# Patient Record
Sex: Female | Born: 1940
Health system: Southern US, Community
[De-identification: ages and names within clinical notes are randomized; demographics above are authoritative.]

## PROBLEM LIST (undated history)

## (undated) DIAGNOSIS — K219 Gastro-esophageal reflux disease without esophagitis: Secondary | ICD-10-CM

## (undated) DIAGNOSIS — E559 Vitamin D deficiency, unspecified: Secondary | ICD-10-CM

## (undated) DIAGNOSIS — I10 Essential (primary) hypertension: Secondary | ICD-10-CM

## (undated) DIAGNOSIS — G4700 Insomnia, unspecified: Secondary | ICD-10-CM

## (undated) DIAGNOSIS — G459 Transient cerebral ischemic attack, unspecified: Secondary | ICD-10-CM

## (undated) DIAGNOSIS — E785 Hyperlipidemia, unspecified: Secondary | ICD-10-CM

## (undated) DIAGNOSIS — G3183 Dementia with Lewy bodies: Secondary | ICD-10-CM

## (undated) DIAGNOSIS — E876 Hypokalemia: Secondary | ICD-10-CM

## (undated) DIAGNOSIS — F419 Anxiety disorder, unspecified: Secondary | ICD-10-CM

## (undated) DIAGNOSIS — K5900 Constipation, unspecified: Secondary | ICD-10-CM

## (undated) DIAGNOSIS — F32A Depression, unspecified: Secondary | ICD-10-CM

## (undated) DIAGNOSIS — C4490 Unspecified malignant neoplasm of skin, unspecified: Secondary | ICD-10-CM

## (undated) DIAGNOSIS — M25569 Pain in unspecified knee: Secondary | ICD-10-CM

## (undated) DIAGNOSIS — G9341 Metabolic encephalopathy: Secondary | ICD-10-CM

## (undated) DIAGNOSIS — I639 Cerebral infarction, unspecified: Secondary | ICD-10-CM

## (undated) DIAGNOSIS — I69354 Hemiplegia and hemiparesis following cerebral infarction affecting left non-dominant side: Secondary | ICD-10-CM

## (undated) DIAGNOSIS — Z9289 Personal history of other medical treatment: Secondary | ICD-10-CM

## (undated) DIAGNOSIS — G3184 Mild cognitive impairment, so stated: Secondary | ICD-10-CM

## (undated) DIAGNOSIS — R2681 Unsteadiness on feet: Secondary | ICD-10-CM

## (undated) DIAGNOSIS — Z9981 Dependence on supplemental oxygen: Secondary | ICD-10-CM

## (undated) DIAGNOSIS — F329 Major depressive disorder, single episode, unspecified: Secondary | ICD-10-CM

## (undated) DIAGNOSIS — M1990 Unspecified osteoarthritis, unspecified site: Secondary | ICD-10-CM

## (undated) DIAGNOSIS — J302 Other seasonal allergic rhinitis: Secondary | ICD-10-CM

## (undated) DIAGNOSIS — R600 Localized edema: Secondary | ICD-10-CM

## (undated) DIAGNOSIS — F0150 Vascular dementia without behavioral disturbance: Secondary | ICD-10-CM

## (undated) HISTORY — PX: EYE SURGERY: SHX253

## (undated) HISTORY — DX: Hyperlipidemia, unspecified: E78.5

## (undated) HISTORY — PX: ARTHROSCOPIC REPAIR ACL: SUR80

## (undated) HISTORY — PX: COLONOSCOPY: SHX174

## (undated) HISTORY — PX: SKIN CANCER EXCISION: SHX779

## (undated) HISTORY — PX: ABDOMINAL HYSTERECTOMY: SHX81

---

## 2000-07-09 ENCOUNTER — Encounter: Admission: RE | Admit: 2000-07-09 | Discharge: 2000-07-09 | Payer: Self-pay | Admitting: Gynecology

## 2000-07-09 ENCOUNTER — Other Ambulatory Visit: Admission: RE | Admit: 2000-07-09 | Discharge: 2000-07-09 | Payer: Self-pay | Admitting: Gynecology

## 2000-07-09 ENCOUNTER — Encounter: Payer: Self-pay | Admitting: Gynecology

## 2001-11-04 ENCOUNTER — Encounter: Payer: Self-pay | Admitting: Gynecology

## 2001-11-04 ENCOUNTER — Encounter: Admission: RE | Admit: 2001-11-04 | Discharge: 2001-11-04 | Payer: Self-pay | Admitting: Gynecology

## 2002-12-28 ENCOUNTER — Other Ambulatory Visit: Admission: RE | Admit: 2002-12-28 | Discharge: 2002-12-28 | Payer: Self-pay | Admitting: Obstetrics and Gynecology

## 2003-02-08 ENCOUNTER — Encounter: Payer: Self-pay | Admitting: Gynecology

## 2003-02-08 ENCOUNTER — Encounter: Admission: RE | Admit: 2003-02-08 | Discharge: 2003-02-08 | Payer: Self-pay | Admitting: Gynecology

## 2004-05-25 ENCOUNTER — Encounter: Admission: RE | Admit: 2004-05-25 | Discharge: 2004-05-25 | Payer: Self-pay | Admitting: Gynecology

## 2004-09-15 ENCOUNTER — Ambulatory Visit: Payer: Self-pay | Admitting: Internal Medicine

## 2004-09-15 ENCOUNTER — Ambulatory Visit (HOSPITAL_COMMUNITY): Admission: RE | Admit: 2004-09-15 | Discharge: 2004-09-15 | Payer: Self-pay | Admitting: Internal Medicine

## 2005-04-19 ENCOUNTER — Emergency Department (HOSPITAL_COMMUNITY): Admission: EM | Admit: 2005-04-19 | Discharge: 2005-04-19 | Payer: Self-pay | Admitting: Emergency Medicine

## 2005-05-28 ENCOUNTER — Encounter: Admission: RE | Admit: 2005-05-28 | Discharge: 2005-05-28 | Payer: Self-pay | Admitting: Gynecology

## 2006-07-08 ENCOUNTER — Encounter: Admission: RE | Admit: 2006-07-08 | Discharge: 2006-07-08 | Payer: Self-pay | Admitting: Gynecology

## 2006-07-09 ENCOUNTER — Ambulatory Visit (HOSPITAL_COMMUNITY): Admission: RE | Admit: 2006-07-09 | Discharge: 2006-07-09 | Payer: Self-pay | Admitting: Family Medicine

## 2006-09-30 ENCOUNTER — Ambulatory Visit (HOSPITAL_COMMUNITY): Admission: RE | Admit: 2006-09-30 | Discharge: 2006-09-30 | Payer: Self-pay | Admitting: Family Medicine

## 2006-11-11 ENCOUNTER — Ambulatory Visit (HOSPITAL_COMMUNITY): Admission: RE | Admit: 2006-11-11 | Discharge: 2006-11-11 | Payer: Self-pay | Admitting: Family Medicine

## 2007-08-14 ENCOUNTER — Encounter: Admission: RE | Admit: 2007-08-14 | Discharge: 2007-08-14 | Payer: Self-pay | Admitting: Gynecology

## 2007-10-05 ENCOUNTER — Encounter: Admission: RE | Admit: 2007-10-05 | Discharge: 2007-10-05 | Payer: Self-pay | Admitting: Orthopedic Surgery

## 2008-03-01 ENCOUNTER — Ambulatory Visit (HOSPITAL_COMMUNITY): Admission: RE | Admit: 2008-03-01 | Discharge: 2008-03-01 | Payer: Self-pay | Admitting: Family Medicine

## 2008-03-16 ENCOUNTER — Ambulatory Visit (HOSPITAL_COMMUNITY): Admission: RE | Admit: 2008-03-16 | Discharge: 2008-03-16 | Payer: Self-pay | Admitting: Family Medicine

## 2008-04-02 ENCOUNTER — Ambulatory Visit (HOSPITAL_COMMUNITY): Admission: RE | Admit: 2008-04-02 | Discharge: 2008-04-02 | Payer: Self-pay | Admitting: Family Medicine

## 2008-04-06 ENCOUNTER — Ambulatory Visit (HOSPITAL_COMMUNITY): Admission: RE | Admit: 2008-04-06 | Discharge: 2008-04-06 | Payer: Self-pay | Admitting: Family Medicine

## 2008-09-09 ENCOUNTER — Encounter: Admission: RE | Admit: 2008-09-09 | Discharge: 2008-09-09 | Payer: Self-pay | Admitting: Gynecology

## 2008-12-22 ENCOUNTER — Emergency Department (HOSPITAL_COMMUNITY): Admission: EM | Admit: 2008-12-22 | Discharge: 2008-12-22 | Payer: Self-pay | Admitting: Emergency Medicine

## 2009-09-12 ENCOUNTER — Encounter: Admission: RE | Admit: 2009-09-12 | Discharge: 2009-09-12 | Payer: Self-pay | Admitting: Gynecology

## 2010-02-13 ENCOUNTER — Ambulatory Visit (HOSPITAL_COMMUNITY): Admission: RE | Admit: 2010-02-13 | Discharge: 2010-02-13 | Payer: Self-pay | Admitting: Family Medicine

## 2010-10-28 ENCOUNTER — Encounter: Payer: Self-pay | Admitting: Family Medicine

## 2010-10-30 ENCOUNTER — Encounter: Payer: Self-pay | Admitting: Family Medicine

## 2011-01-18 LAB — BASIC METABOLIC PANEL
BUN: 4 mg/dL — ABNORMAL LOW (ref 6–23)
Calcium: 9.2 mg/dL (ref 8.4–10.5)
Glucose, Bld: 97 mg/dL (ref 70–99)
Sodium: 131 mEq/L — ABNORMAL LOW (ref 135–145)

## 2011-01-18 LAB — DIFFERENTIAL
Eosinophils Relative: 6 % — ABNORMAL HIGH (ref 0–5)
Lymphocytes Relative: 21 % (ref 12–46)
Monocytes Absolute: 0.6 10*3/uL (ref 0.1–1.0)
Monocytes Relative: 8 % (ref 3–12)
Neutro Abs: 5.2 10*3/uL (ref 1.7–7.7)
Neutrophils Relative %: 65 % (ref 43–77)

## 2011-01-18 LAB — CBC
HCT: 36.1 % (ref 36.0–46.0)
Platelets: 220 10*3/uL (ref 150–400)

## 2011-01-30 ENCOUNTER — Other Ambulatory Visit: Payer: Self-pay | Admitting: Gynecology

## 2011-01-30 DIAGNOSIS — Z1231 Encounter for screening mammogram for malignant neoplasm of breast: Secondary | ICD-10-CM

## 2011-02-07 ENCOUNTER — Ambulatory Visit
Admission: RE | Admit: 2011-02-07 | Discharge: 2011-02-07 | Disposition: A | Discharge status: Home / Self Care | Payer: Medicare Other | Source: Ambulatory Visit | Attending: Gynecology | Admitting: Gynecology

## 2011-02-07 DIAGNOSIS — Z1231 Encounter for screening mammogram for malignant neoplasm of breast: Secondary | ICD-10-CM

## 2011-02-08 ENCOUNTER — Other Ambulatory Visit: Payer: Self-pay | Admitting: Gynecology

## 2011-02-08 DIAGNOSIS — R928 Other abnormal and inconclusive findings on diagnostic imaging of breast: Secondary | ICD-10-CM

## 2011-02-13 ENCOUNTER — Other Ambulatory Visit: Payer: Medicare Other

## 2011-02-14 ENCOUNTER — Ambulatory Visit
Admission: RE | Admit: 2011-02-14 | Discharge: 2011-02-14 | Disposition: A | Discharge status: Home / Self Care | Payer: Medicare Other | Source: Ambulatory Visit | Attending: Gynecology | Admitting: Gynecology

## 2011-02-14 DIAGNOSIS — R928 Other abnormal and inconclusive findings on diagnostic imaging of breast: Secondary | ICD-10-CM

## 2011-02-23 NOTE — Op Note (Signed)
Claire West, Claire West              ACCOUNT NO.:  [id number]   MEDICAL RECORD NO.:  [id number]          PATIENT TYPE:  AMB   LOCATION:  DAY                           FACILITY:  [facility]   PHYSICIAN:  Lionel December, M.D.    DATE OF BIRTH:  06/07/1941   DATE OF PROCEDURE:  09/15/2004  DATE OF DISCHARGE:                                 OPERATIVE REPORT   PROCEDURE:  Total colonoscopy.   INDICATIONS:  Claire West is a 70 year old Caucasian female who is here for  screening colonoscopy. Family history is negative for colorectal carcinoma.  Procedure and risks were reviewed with the patient and informed consent was  obtained.   PREOPERATIVE MEDICATIONS:  Demerol 25 mg IV, Versed 6 mg IV in divided  doses.   FINDINGS:  Procedure performed in endoscopy suite. The patient's vital signs  and O2 saturations were monitored during procedure and remained stable. The  patient was placed in the left lateral position and rectal examination  performed. No abnormality noted on external or digital exam. Olympus video  scope was placed in the rectum and advanced into sigmoid colon which was  somewhat noncompliant and tortuous. Once the scope was passed through the  sigmoid colon further, intubation of the cecum was rather easy. Cecum was  identified by appendiceal orifice and ileocecal valve. Picture taken for the  record. As the scope was withdrawn, colonic mucosa was carefully examined  and was normal throughout. The rectal mucosa similarly was normal. Scope was  retroflexed to examine anorectal junction, and mucosa at anorectal junction  was thickened and prominent. No hemorrhoids were noted. The scope was  straightened and withdrawn. The patient tolerated the procedure well.   FINAL DIAGNOSIS:  Normal colonoscopy.   RECOMMENDATIONS:  Yearly hemoccults. She may consider screening in 10 years  from now.     Claire West   NR/MEDQ  D:  09/15/2004  T:  09/15/2004  Job:  [id number]   cc:   Patrica Duel, M.D.  [address]  [place]  [place] [address]  Fax: [phone]

## 2011-05-15 ENCOUNTER — Ambulatory Visit (INDEPENDENT_AMBULATORY_CARE_PROVIDER_SITE_OTHER): Payer: Medicare Other | Admitting: Urology

## 2011-05-15 DIAGNOSIS — R350 Frequency of micturition: Secondary | ICD-10-CM

## 2011-05-15 DIAGNOSIS — N3946 Mixed incontinence: Secondary | ICD-10-CM

## 2011-05-15 DIAGNOSIS — R3915 Urgency of urination: Secondary | ICD-10-CM

## 2011-05-15 DIAGNOSIS — N952 Postmenopausal atrophic vaginitis: Secondary | ICD-10-CM

## 2011-07-10 ENCOUNTER — Ambulatory Visit (INDEPENDENT_AMBULATORY_CARE_PROVIDER_SITE_OTHER): Payer: Medicare Other | Admitting: Urology

## 2011-07-10 DIAGNOSIS — R350 Frequency of micturition: Secondary | ICD-10-CM

## 2011-07-10 DIAGNOSIS — N952 Postmenopausal atrophic vaginitis: Secondary | ICD-10-CM

## 2011-07-10 DIAGNOSIS — R3915 Urgency of urination: Secondary | ICD-10-CM

## 2011-07-10 DIAGNOSIS — N3946 Mixed incontinence: Secondary | ICD-10-CM

## 2011-10-10 DIAGNOSIS — R269 Unspecified abnormalities of gait and mobility: Secondary | ICD-10-CM | POA: Diagnosis not present

## 2011-10-12 DIAGNOSIS — R269 Unspecified abnormalities of gait and mobility: Secondary | ICD-10-CM | POA: Diagnosis not present

## 2011-10-15 DIAGNOSIS — R269 Unspecified abnormalities of gait and mobility: Secondary | ICD-10-CM | POA: Diagnosis not present

## 2011-10-17 DIAGNOSIS — R269 Unspecified abnormalities of gait and mobility: Secondary | ICD-10-CM | POA: Diagnosis not present

## 2011-10-19 DIAGNOSIS — R269 Unspecified abnormalities of gait and mobility: Secondary | ICD-10-CM | POA: Diagnosis not present

## 2011-10-22 DIAGNOSIS — R269 Unspecified abnormalities of gait and mobility: Secondary | ICD-10-CM | POA: Diagnosis not present

## 2011-10-24 DIAGNOSIS — R269 Unspecified abnormalities of gait and mobility: Secondary | ICD-10-CM | POA: Diagnosis not present

## 2011-10-26 DIAGNOSIS — R269 Unspecified abnormalities of gait and mobility: Secondary | ICD-10-CM | POA: Diagnosis not present

## 2011-10-29 DIAGNOSIS — R269 Unspecified abnormalities of gait and mobility: Secondary | ICD-10-CM | POA: Diagnosis not present

## 2011-10-31 DIAGNOSIS — R269 Unspecified abnormalities of gait and mobility: Secondary | ICD-10-CM | POA: Diagnosis not present

## 2011-11-02 DIAGNOSIS — R269 Unspecified abnormalities of gait and mobility: Secondary | ICD-10-CM | POA: Diagnosis not present

## 2011-11-05 DIAGNOSIS — R269 Unspecified abnormalities of gait and mobility: Secondary | ICD-10-CM | POA: Diagnosis not present

## 2011-11-07 DIAGNOSIS — R269 Unspecified abnormalities of gait and mobility: Secondary | ICD-10-CM | POA: Diagnosis not present

## 2011-11-09 DIAGNOSIS — R269 Unspecified abnormalities of gait and mobility: Secondary | ICD-10-CM | POA: Diagnosis not present

## 2011-11-12 DIAGNOSIS — R269 Unspecified abnormalities of gait and mobility: Secondary | ICD-10-CM | POA: Diagnosis not present

## 2011-11-14 DIAGNOSIS — R269 Unspecified abnormalities of gait and mobility: Secondary | ICD-10-CM | POA: Diagnosis not present

## 2011-11-16 DIAGNOSIS — IMO0002 Reserved for concepts with insufficient information to code with codable children: Secondary | ICD-10-CM | POA: Diagnosis not present

## 2011-11-16 DIAGNOSIS — N959 Unspecified menopausal and perimenopausal disorder: Secondary | ICD-10-CM | POA: Diagnosis not present

## 2011-11-16 DIAGNOSIS — R269 Unspecified abnormalities of gait and mobility: Secondary | ICD-10-CM | POA: Diagnosis not present

## 2011-11-16 DIAGNOSIS — R05 Cough: Secondary | ICD-10-CM | POA: Diagnosis not present

## 2011-11-16 DIAGNOSIS — J069 Acute upper respiratory infection, unspecified: Secondary | ICD-10-CM | POA: Diagnosis not present

## 2011-11-19 DIAGNOSIS — R269 Unspecified abnormalities of gait and mobility: Secondary | ICD-10-CM | POA: Diagnosis not present

## 2011-11-20 DIAGNOSIS — Z4789 Encounter for other orthopedic aftercare: Secondary | ICD-10-CM | POA: Diagnosis not present

## 2011-11-20 DIAGNOSIS — M25569 Pain in unspecified knee: Secondary | ICD-10-CM | POA: Diagnosis not present

## 2011-11-23 DIAGNOSIS — R269 Unspecified abnormalities of gait and mobility: Secondary | ICD-10-CM | POA: Diagnosis not present

## 2011-11-26 DIAGNOSIS — R269 Unspecified abnormalities of gait and mobility: Secondary | ICD-10-CM | POA: Diagnosis not present

## 2011-11-28 DIAGNOSIS — R269 Unspecified abnormalities of gait and mobility: Secondary | ICD-10-CM | POA: Diagnosis not present

## 2011-11-30 DIAGNOSIS — R269 Unspecified abnormalities of gait and mobility: Secondary | ICD-10-CM | POA: Diagnosis not present

## 2012-01-01 DIAGNOSIS — IMO0002 Reserved for concepts with insufficient information to code with codable children: Secondary | ICD-10-CM | POA: Diagnosis not present

## 2012-01-01 DIAGNOSIS — S239XXA Sprain of unspecified parts of thorax, initial encounter: Secondary | ICD-10-CM | POA: Diagnosis not present

## 2012-01-02 DIAGNOSIS — E782 Mixed hyperlipidemia: Secondary | ICD-10-CM | POA: Diagnosis not present

## 2012-01-02 DIAGNOSIS — S239XXA Sprain of unspecified parts of thorax, initial encounter: Secondary | ICD-10-CM | POA: Diagnosis not present

## 2012-01-02 DIAGNOSIS — I10 Essential (primary) hypertension: Secondary | ICD-10-CM | POA: Diagnosis not present

## 2012-02-13 DIAGNOSIS — N390 Urinary tract infection, site not specified: Secondary | ICD-10-CM | POA: Diagnosis not present

## 2012-02-13 DIAGNOSIS — IMO0002 Reserved for concepts with insufficient information to code with codable children: Secondary | ICD-10-CM | POA: Diagnosis not present

## 2012-02-13 DIAGNOSIS — I10 Essential (primary) hypertension: Secondary | ICD-10-CM | POA: Diagnosis not present

## 2012-02-22 DIAGNOSIS — N390 Urinary tract infection, site not specified: Secondary | ICD-10-CM | POA: Diagnosis not present

## 2012-02-22 DIAGNOSIS — IMO0002 Reserved for concepts with insufficient information to code with codable children: Secondary | ICD-10-CM | POA: Diagnosis not present

## 2012-02-22 DIAGNOSIS — I10 Essential (primary) hypertension: Secondary | ICD-10-CM | POA: Diagnosis not present

## 2012-02-22 DIAGNOSIS — R609 Edema, unspecified: Secondary | ICD-10-CM | POA: Diagnosis not present

## 2012-02-27 DIAGNOSIS — L259 Unspecified contact dermatitis, unspecified cause: Secondary | ICD-10-CM | POA: Diagnosis not present

## 2012-02-27 DIAGNOSIS — IMO0002 Reserved for concepts with insufficient information to code with codable children: Secondary | ICD-10-CM | POA: Diagnosis not present

## 2012-02-28 DIAGNOSIS — H2510 Age-related nuclear cataract, unspecified eye: Secondary | ICD-10-CM | POA: Diagnosis not present

## 2012-05-10 DIAGNOSIS — IMO0002 Reserved for concepts with insufficient information to code with codable children: Secondary | ICD-10-CM | POA: Diagnosis not present

## 2012-05-10 DIAGNOSIS — N390 Urinary tract infection, site not specified: Secondary | ICD-10-CM | POA: Diagnosis not present

## 2012-05-10 DIAGNOSIS — R300 Dysuria: Secondary | ICD-10-CM | POA: Diagnosis not present

## 2012-07-03 ENCOUNTER — Other Ambulatory Visit (HOSPITAL_COMMUNITY): Payer: Self-pay | Admitting: Internal Medicine

## 2012-07-03 ENCOUNTER — Ambulatory Visit (HOSPITAL_COMMUNITY)
Admission: RE | Admit: 2012-07-03 | Discharge: 2012-07-03 | Disposition: A | Discharge status: Home / Self Care | Payer: Medicare Other | Source: Ambulatory Visit | Attending: Internal Medicine | Admitting: Internal Medicine

## 2012-07-03 DIAGNOSIS — K7689 Other specified diseases of liver: Secondary | ICD-10-CM | POA: Diagnosis not present

## 2012-07-03 DIAGNOSIS — Z139 Encounter for screening, unspecified: Secondary | ICD-10-CM

## 2012-07-03 DIAGNOSIS — R1011 Right upper quadrant pain: Secondary | ICD-10-CM | POA: Diagnosis not present

## 2012-07-03 DIAGNOSIS — I10 Essential (primary) hypertension: Secondary | ICD-10-CM | POA: Diagnosis not present

## 2012-07-03 DIAGNOSIS — R109 Unspecified abdominal pain: Secondary | ICD-10-CM

## 2012-07-03 DIAGNOSIS — IMO0002 Reserved for concepts with insufficient information to code with codable children: Secondary | ICD-10-CM | POA: Diagnosis not present

## 2012-07-03 DIAGNOSIS — E785 Hyperlipidemia, unspecified: Secondary | ICD-10-CM | POA: Diagnosis not present

## 2012-07-03 DIAGNOSIS — Z79899 Other long term (current) drug therapy: Secondary | ICD-10-CM | POA: Diagnosis not present

## 2012-07-03 DIAGNOSIS — Z23 Encounter for immunization: Secondary | ICD-10-CM | POA: Diagnosis not present

## 2012-07-04 ENCOUNTER — Other Ambulatory Visit (HOSPITAL_COMMUNITY): Payer: Self-pay | Admitting: Internal Medicine

## 2012-07-04 DIAGNOSIS — R109 Unspecified abdominal pain: Secondary | ICD-10-CM

## 2012-07-07 ENCOUNTER — Ambulatory Visit (HOSPITAL_COMMUNITY): Payer: Medicare Other

## 2012-07-08 ENCOUNTER — Encounter (HOSPITAL_COMMUNITY)
Admission: RE | Admit: 2012-07-08 | Discharge: 2012-07-08 | Disposition: A | Discharge status: Home / Self Care | Payer: Medicare Other | Source: Ambulatory Visit | Attending: Internal Medicine | Admitting: Internal Medicine

## 2012-07-08 ENCOUNTER — Encounter (HOSPITAL_COMMUNITY): Payer: Self-pay

## 2012-07-08 DIAGNOSIS — R109 Unspecified abdominal pain: Secondary | ICD-10-CM | POA: Insufficient documentation

## 2012-07-08 HISTORY — DX: Essential (primary) hypertension: I10

## 2012-07-08 MED ORDER — TECHNETIUM TC 99M MEBROFENIN IV KIT
5.0000 | PACK | Freq: Once | INTRAVENOUS | Status: AC | PRN
  Administered 2012-07-08: 5 via INTRAVENOUS

## 2012-07-09 ENCOUNTER — Ambulatory Visit (HOSPITAL_COMMUNITY)
Admission: RE | Admit: 2012-07-09 | Discharge: 2012-07-09 | Disposition: A | Discharge status: Home / Self Care | Payer: Medicare Other | Source: Ambulatory Visit | Attending: Internal Medicine | Admitting: Internal Medicine

## 2012-07-09 DIAGNOSIS — R109 Unspecified abdominal pain: Secondary | ICD-10-CM | POA: Diagnosis not present

## 2012-07-09 MED ORDER — IOHEXOL 350 MG/ML SOLN
100.0000 mL | Freq: Once | INTRAVENOUS | Status: AC | PRN
  Administered 2012-07-09: 100 mL via INTRAVENOUS

## 2012-07-22 DIAGNOSIS — J029 Acute pharyngitis, unspecified: Secondary | ICD-10-CM | POA: Diagnosis not present

## 2012-07-22 DIAGNOSIS — J069 Acute upper respiratory infection, unspecified: Secondary | ICD-10-CM | POA: Diagnosis not present

## 2012-07-22 DIAGNOSIS — IMO0002 Reserved for concepts with insufficient information to code with codable children: Secondary | ICD-10-CM | POA: Diagnosis not present

## 2012-10-09 DIAGNOSIS — IMO0002 Reserved for concepts with insufficient information to code with codable children: Secondary | ICD-10-CM | POA: Diagnosis not present

## 2012-10-09 DIAGNOSIS — J019 Acute sinusitis, unspecified: Secondary | ICD-10-CM | POA: Diagnosis not present

## 2012-10-09 DIAGNOSIS — L259 Unspecified contact dermatitis, unspecified cause: Secondary | ICD-10-CM | POA: Diagnosis not present

## 2012-10-09 DIAGNOSIS — I10 Essential (primary) hypertension: Secondary | ICD-10-CM | POA: Diagnosis not present

## 2012-11-11 DIAGNOSIS — Z85828 Personal history of other malignant neoplasm of skin: Secondary | ICD-10-CM | POA: Diagnosis not present

## 2012-11-11 DIAGNOSIS — Z1283 Encounter for screening for malignant neoplasm of skin: Secondary | ICD-10-CM | POA: Diagnosis not present

## 2012-12-24 ENCOUNTER — Other Ambulatory Visit (HOSPITAL_COMMUNITY): Payer: Self-pay | Admitting: Family Medicine

## 2012-12-24 DIAGNOSIS — I10 Essential (primary) hypertension: Secondary | ICD-10-CM | POA: Diagnosis not present

## 2012-12-24 DIAGNOSIS — J309 Allergic rhinitis, unspecified: Secondary | ICD-10-CM | POA: Diagnosis not present

## 2012-12-24 DIAGNOSIS — Z139 Encounter for screening, unspecified: Secondary | ICD-10-CM

## 2012-12-24 DIAGNOSIS — M159 Polyosteoarthritis, unspecified: Secondary | ICD-10-CM | POA: Diagnosis not present

## 2012-12-24 DIAGNOSIS — IMO0002 Reserved for concepts with insufficient information to code with codable children: Secondary | ICD-10-CM | POA: Diagnosis not present

## 2012-12-24 DIAGNOSIS — N958 Other specified menopausal and perimenopausal disorders: Secondary | ICD-10-CM | POA: Diagnosis not present

## 2012-12-29 ENCOUNTER — Other Ambulatory Visit (HOSPITAL_COMMUNITY): Payer: Medicare Other

## 2013-02-18 DIAGNOSIS — J310 Chronic rhinitis: Secondary | ICD-10-CM | POA: Diagnosis not present

## 2013-02-18 DIAGNOSIS — J019 Acute sinusitis, unspecified: Secondary | ICD-10-CM | POA: Diagnosis not present

## 2013-02-18 DIAGNOSIS — IMO0002 Reserved for concepts with insufficient information to code with codable children: Secondary | ICD-10-CM | POA: Diagnosis not present

## 2013-05-12 DIAGNOSIS — IMO0002 Reserved for concepts with insufficient information to code with codable children: Secondary | ICD-10-CM | POA: Diagnosis not present

## 2013-05-12 DIAGNOSIS — F411 Generalized anxiety disorder: Secondary | ICD-10-CM | POA: Diagnosis not present

## 2013-05-12 DIAGNOSIS — F4320 Adjustment disorder, unspecified: Secondary | ICD-10-CM | POA: Diagnosis not present

## 2013-06-09 DIAGNOSIS — N390 Urinary tract infection, site not specified: Secondary | ICD-10-CM | POA: Diagnosis not present

## 2013-06-09 DIAGNOSIS — IMO0002 Reserved for concepts with insufficient information to code with codable children: Secondary | ICD-10-CM | POA: Diagnosis not present

## 2013-06-19 DIAGNOSIS — H2510 Age-related nuclear cataract, unspecified eye: Secondary | ICD-10-CM | POA: Diagnosis not present

## 2013-07-15 DIAGNOSIS — Z23 Encounter for immunization: Secondary | ICD-10-CM | POA: Diagnosis not present

## 2013-08-17 DIAGNOSIS — J069 Acute upper respiratory infection, unspecified: Secondary | ICD-10-CM | POA: Diagnosis not present

## 2013-08-17 DIAGNOSIS — J029 Acute pharyngitis, unspecified: Secondary | ICD-10-CM | POA: Diagnosis not present

## 2013-08-17 DIAGNOSIS — IMO0002 Reserved for concepts with insufficient information to code with codable children: Secondary | ICD-10-CM | POA: Diagnosis not present

## 2014-01-18 DIAGNOSIS — I10 Essential (primary) hypertension: Secondary | ICD-10-CM | POA: Diagnosis not present

## 2014-01-18 DIAGNOSIS — IMO0002 Reserved for concepts with insufficient information to code with codable children: Secondary | ICD-10-CM | POA: Diagnosis not present

## 2014-01-18 DIAGNOSIS — N390 Urinary tract infection, site not specified: Secondary | ICD-10-CM | POA: Diagnosis not present

## 2014-02-09 DIAGNOSIS — IMO0002 Reserved for concepts with insufficient information to code with codable children: Secondary | ICD-10-CM | POA: Diagnosis not present

## 2014-02-09 DIAGNOSIS — I10 Essential (primary) hypertension: Secondary | ICD-10-CM | POA: Diagnosis not present

## 2014-02-09 DIAGNOSIS — J019 Acute sinusitis, unspecified: Secondary | ICD-10-CM | POA: Diagnosis not present

## 2014-03-09 DIAGNOSIS — K219 Gastro-esophageal reflux disease without esophagitis: Secondary | ICD-10-CM | POA: Diagnosis not present

## 2014-03-09 DIAGNOSIS — I10 Essential (primary) hypertension: Secondary | ICD-10-CM | POA: Diagnosis not present

## 2014-03-09 DIAGNOSIS — IMO0002 Reserved for concepts with insufficient information to code with codable children: Secondary | ICD-10-CM | POA: Diagnosis not present

## 2014-03-18 DIAGNOSIS — G5600 Carpal tunnel syndrome, unspecified upper limb: Secondary | ICD-10-CM | POA: Diagnosis not present

## 2014-05-04 DIAGNOSIS — M25569 Pain in unspecified knee: Secondary | ICD-10-CM | POA: Diagnosis not present

## 2014-05-12 DIAGNOSIS — N390 Urinary tract infection, site not specified: Secondary | ICD-10-CM | POA: Diagnosis not present

## 2014-05-12 DIAGNOSIS — IMO0002 Reserved for concepts with insufficient information to code with codable children: Secondary | ICD-10-CM | POA: Diagnosis not present

## 2014-06-15 DIAGNOSIS — M25569 Pain in unspecified knee: Secondary | ICD-10-CM | POA: Diagnosis not present

## 2014-06-21 DIAGNOSIS — H2510 Age-related nuclear cataract, unspecified eye: Secondary | ICD-10-CM | POA: Diagnosis not present

## 2014-07-01 DIAGNOSIS — Z23 Encounter for immunization: Secondary | ICD-10-CM | POA: Diagnosis not present

## 2014-07-06 DIAGNOSIS — H5340 Unspecified visual field defects: Secondary | ICD-10-CM | POA: Diagnosis not present

## 2014-11-08 DIAGNOSIS — L309 Dermatitis, unspecified: Secondary | ICD-10-CM | POA: Diagnosis not present

## 2014-11-08 DIAGNOSIS — Z6821 Body mass index (BMI) 21.0-21.9, adult: Secondary | ICD-10-CM | POA: Diagnosis not present

## 2014-11-08 DIAGNOSIS — F419 Anxiety disorder, unspecified: Secondary | ICD-10-CM | POA: Diagnosis not present

## 2014-11-18 DIAGNOSIS — J040 Acute laryngitis: Secondary | ICD-10-CM | POA: Diagnosis not present

## 2014-11-18 DIAGNOSIS — Z6821 Body mass index (BMI) 21.0-21.9, adult: Secondary | ICD-10-CM | POA: Diagnosis not present

## 2014-11-18 DIAGNOSIS — F329 Major depressive disorder, single episode, unspecified: Secondary | ICD-10-CM | POA: Diagnosis not present

## 2015-01-21 DIAGNOSIS — K529 Noninfective gastroenteritis and colitis, unspecified: Secondary | ICD-10-CM | POA: Diagnosis not present

## 2015-01-21 DIAGNOSIS — F4320 Adjustment disorder, unspecified: Secondary | ICD-10-CM | POA: Diagnosis not present

## 2015-01-21 DIAGNOSIS — Z6820 Body mass index (BMI) 20.0-20.9, adult: Secondary | ICD-10-CM | POA: Diagnosis not present

## 2015-03-14 DIAGNOSIS — R350 Frequency of micturition: Secondary | ICD-10-CM | POA: Diagnosis not present

## 2015-03-14 DIAGNOSIS — Z6821 Body mass index (BMI) 21.0-21.9, adult: Secondary | ICD-10-CM | POA: Diagnosis not present

## 2015-05-13 ENCOUNTER — Emergency Department (HOSPITAL_COMMUNITY): Payer: Medicare Other

## 2015-05-13 ENCOUNTER — Encounter (HOSPITAL_COMMUNITY): Payer: Self-pay | Admitting: Emergency Medicine

## 2015-05-13 ENCOUNTER — Emergency Department (HOSPITAL_COMMUNITY)
Admission: EM | Admit: 2015-05-13 | Discharge: 2015-05-13 | Disposition: A | Discharge status: Home / Self Care | Payer: Medicare Other | Attending: Emergency Medicine | Admitting: Emergency Medicine

## 2015-05-13 DIAGNOSIS — Z880 Allergy status to penicillin: Secondary | ICD-10-CM | POA: Diagnosis not present

## 2015-05-13 DIAGNOSIS — I10 Essential (primary) hypertension: Secondary | ICD-10-CM | POA: Diagnosis not present

## 2015-05-13 DIAGNOSIS — Y998 Other external cause status: Secondary | ICD-10-CM | POA: Diagnosis not present

## 2015-05-13 DIAGNOSIS — Z859 Personal history of malignant neoplasm, unspecified: Secondary | ICD-10-CM | POA: Diagnosis not present

## 2015-05-13 DIAGNOSIS — Z7982 Long term (current) use of aspirin: Secondary | ICD-10-CM | POA: Insufficient documentation

## 2015-05-13 DIAGNOSIS — Y92009 Unspecified place in unspecified non-institutional (private) residence as the place of occurrence of the external cause: Secondary | ICD-10-CM | POA: Diagnosis not present

## 2015-05-13 DIAGNOSIS — Y9389 Activity, other specified: Secondary | ICD-10-CM | POA: Insufficient documentation

## 2015-05-13 DIAGNOSIS — W108XXA Fall (on) (from) other stairs and steps, initial encounter: Secondary | ICD-10-CM | POA: Diagnosis not present

## 2015-05-13 DIAGNOSIS — Z791 Long term (current) use of non-steroidal anti-inflammatories (NSAID): Secondary | ICD-10-CM | POA: Insufficient documentation

## 2015-05-13 DIAGNOSIS — Z79899 Other long term (current) drug therapy: Secondary | ICD-10-CM | POA: Insufficient documentation

## 2015-05-13 DIAGNOSIS — S93402A Sprain of unspecified ligament of left ankle, initial encounter: Secondary | ICD-10-CM | POA: Insufficient documentation

## 2015-05-13 DIAGNOSIS — S93409A Sprain of unspecified ligament of unspecified ankle, initial encounter: Secondary | ICD-10-CM

## 2015-05-13 DIAGNOSIS — S99912A Unspecified injury of left ankle, initial encounter: Secondary | ICD-10-CM | POA: Diagnosis present

## 2015-05-13 NOTE — Discharge Instructions (Signed)
Nonweightbearing on your left ankle for the next 48 hours. Walk with crutches only. Intermittently apply ice to your ankle. As your pain and swelling improved, slowly increase your weightbearing and use of this ankle.  Ankle Sprain An ankle sprain is an injury to the strong, fibrous tissues (ligaments) that hold the bones of your ankle joint together.  CAUSES An ankle sprain is usually caused by a fall or by twisting your ankle. Ankle sprains most commonly occur when you step on the outer edge of your foot, and your ankle turns inward. People who participate in sports are more prone to these types of injuries.  SYMPTOMS   Pain in your ankle. The pain may be present at rest or only when you are trying to stand or walk.  Swelling.  Bruising. Bruising may develop immediately or within 1 to 2 days after your injury.  Difficulty standing or walking, particularly when turning corners or changing directions. DIAGNOSIS  Your caregiver will ask you details about your injury and perform a physical exam of your ankle to determine if you have an ankle sprain. During the physical exam, your caregiver will press on and apply pressure to specific areas of your foot and ankle. Your caregiver will try to move your ankle in certain ways. An X-ray exam may be done to be sure a bone was not broken or a ligament did not separate from one of the bones in your ankle (avulsion fracture).  TREATMENT  Certain types of braces can help stabilize your ankle. Your caregiver can make a recommendation for this. Your caregiver may recommend the use of medicine for pain. If your sprain is severe, your caregiver may refer you to a surgeon who helps to restore function to parts of your skeletal system (orthopedist) or a physical therapist. Parke ice to your injury for 1-2 days or as directed by your caregiver. Applying ice helps to reduce inflammation and pain.  Put ice in a plastic bag.  Place a  towel between your skin and the bag.  Leave the ice on for 15-20 minutes at a time, every 2 hours while you are awake.  Only take over-the-counter or prescription medicines for pain, discomfort, or fever as directed by your caregiver.  Elevate your injured ankle above the level of your heart as much as possible for 2-3 days.  If your caregiver recommends crutches, use them as instructed. Gradually put weight on the affected ankle. Continue to use crutches or a cane until you can walk without feeling pain in your ankle.  If you have a plaster splint, wear the splint as directed by your caregiver. Do not rest it on anything harder than a pillow for the first 24 hours. Do not put weight on it. Do not get it wet. You may take it off to take a shower or bath.  You may have been given an elastic bandage to wear around your ankle to provide support. If the elastic bandage is too tight (you have numbness or tingling in your foot or your foot becomes cold and blue), adjust the bandage to make it comfortable.  If you have an air splint, you may blow more air into it or let air out to make it more comfortable. You may take your splint off at night and before taking a shower or bath. Wiggle your toes in the splint several times per day to decrease swelling. SEEK MEDICAL CARE IF:   You have rapidly increasing  bruising or swelling.  Your toes feel extremely cold or you lose feeling in your foot.  Your pain is not relieved with medicine. SEEK IMMEDIATE MEDICAL CARE IF:  Your toes are numb or blue.  You have severe pain that is increasing. MAKE SURE YOU:   Understand these instructions.  Will watch your condition.  Will get help right away if you are not doing well or get worse. Document Released: 09/24/2005 Document Revised: 06/18/2012 Document Reviewed: 10/06/2011 [facility] Patient Information 2015 [place], [place]. This information is not intended to replace advice given to you by your health  care provider. Make sure you discuss any questions you have with your health care provider.

## 2015-05-13 NOTE — ED Provider Notes (Signed)
CSN: [id number]     Arrival date & time 05/13/15  1939 History   First MD Initiated Contact with Patient 05/13/15 1955     Chief Complaint  Patient presents with  . Ankle Pain     HPI  Patient presents for valuation of lateral left ankle pain after a fall at home. Inverted her ankle. Swollen and black and blue. Walks with a limp. No other areas of pain concern or injury.  Past Medical History  Diagnosis Date  . Cancer   . Hypertension    Past Surgical History  Procedure Laterality Date  . Arthroscopic repair acl    . Abdominal hysterectomy     History reviewed. No pertinent family history. History  Substance Use Topics  . Smoking status: Never Smoker   . Smokeless tobacco: Not on file  . Alcohol Use: No   OB History    No data available     Review of Systems  Constitutional: Negative for fever, chills, diaphoresis, appetite change and fatigue.  HENT: Negative for mouth sores, sore throat and trouble swallowing.   Eyes: Negative for visual disturbance.  Respiratory: Negative for cough, chest tightness, shortness of breath and wheezing.   Cardiovascular: Negative for chest pain.  Gastrointestinal: Negative for nausea, vomiting, abdominal pain, diarrhea and abdominal distention.  Endocrine: Negative for polydipsia, polyphagia and polyuria.  Genitourinary: Negative for dysuria, frequency and hematuria.  Musculoskeletal: Negative for gait problem.       Left ankle pain laterally. No pain at the knee  Skin: Negative for color change, pallor and rash.  Neurological: Negative for dizziness, syncope, light-headedness and headaches.  Hematological: Does not bruise/bleed easily.  Psychiatric/Behavioral: Negative for behavioral problems and confusion.      Allergies  Codeine; Penicillins; and Sulfa antibiotics  Home Medications   Prior to Admission medications   Medication Sig Start Date End Date Taking? Authorizing Provider  aspirin EC 81 MG tablet Take 81 mg by mouth  daily.   Yes Historical Provider, MD  atenolol (TENORMIN) 25 MG tablet Take 25 mg by mouth daily. 02/18/15  Yes Historical Provider, MD  Calcium Carbonate-Vitamin D (CALCIUM-D PO) Take 1 tablet by mouth daily.   Yes Historical Provider, MD  escitalopram (LEXAPRO) 5 MG tablet Take 5 mg by mouth daily. 04/23/15  Yes Historical Provider, MD  fluticasone (FLONASE) 50 MCG/ACT nasal spray Place 2 sprays into both nostrils daily as needed. 04/16/15  Yes Historical Provider, MD  losartan-hydrochlorothiazide (HYZAAR) 100-12.5 MG per tablet Take 1 tablet by mouth daily. 04/16/15  Yes Historical Provider, MD  meloxicam (MOBIC) 15 MG tablet Take 15 mg by mouth daily. 04/16/15  Yes Historical Provider, MD  Multiple Vitamin (MULTIVITAMIN WITH MINERALS) TABS tablet Take 1 tablet by mouth daily.   Yes Historical Provider, MD  omeprazole (PRILOSEC) 20 MG capsule Take 20 mg by mouth daily. 04/18/15  Yes Historical Provider, MD   BP 167/89 mmHg  Pulse 72  Temp(Src) 98.3 F (36.8 C) (Oral)  Resp 18  Ht 5\' 6"  (1.676 m)  Wt 130 lb (58.968 kg)  BMI 20.99 kg/m2  SpO2 98% Physical Exam  Constitutional: She is oriented to person, place, and time. She appears well-developed and well-nourished. No distress.  HENT:  Head: Normocephalic.  Eyes: Conjunctivae are normal. Pupils are equal, round, and reactive to light. No scleral icterus.  Neck: Normal range of motion. Neck supple. No thyromegaly present.  Cardiovascular: Normal rate and regular rhythm.  Exam reveals no gallop and no friction rub.  No murmur heard. Pulmonary/Chest: Effort normal and breath sounds normal. No respiratory distress. She has no wheezes. She has no rales.  Abdominal: Soft. Bowel sounds are normal. She exhibits no distension. There is no tenderness. There is no rebound.  Musculoskeletal: Normal range of motion.       Feet:  Neurological: She is alert and oriented to person, place, and time.  Skin: Skin is warm and dry. No rash noted.    Psychiatric: She has a normal mood and affect. Her behavior is normal.    ED Course  Procedures (including critical care time) Labs Review Labs Reviewed - No data to display  Imaging Review Dg Ankle Complete Left  05/13/2015   CLINICAL DATA:  Patient coming down the stairs and twisted ankle. Initial encounter.  EXAM: LEFT ANKLE COMPLETE - 3+ VIEW  COMPARISON:  None.  FINDINGS: Normal anatomic alignment. No evidence for acute fracture dislocation. Regional soft tissue swelling. Vascular calcifications.  IMPRESSION: No acute osseous abnormality.   Electronically Signed   By: Lovey Newcomer M.D.   On: 05/13/2015 20:43     EKG Interpretation None      MDM   Final diagnoses:  Ankle sprain, unspecified laterality, initial encounter    Lasix splint placed. Instructed the use of crutches. Discharged home. Ice and elevate. Primary care follow-up if not improving. Slowly increase her weightbearing after 48 hours at nonweightbearing.    Tanna Furry, MD 05/13/15 2350

## 2015-05-13 NOTE — ED Notes (Signed)
Patient verbalizes understanding of discharge instructions, home care and follow up care. Patient demonstrated proper use of ankle brace and crutches. Patient out of department at this time with family.

## 2015-05-13 NOTE — ED Notes (Signed)
Patient states she was coming down steps and when she got to the last one her left ankle turned. Complaining of pain to left ankle. Noted swelling to left ankle at triage.

## 2015-05-24 DIAGNOSIS — Z1389 Encounter for screening for other disorder: Secondary | ICD-10-CM | POA: Diagnosis not present

## 2015-05-24 DIAGNOSIS — S93402D Sprain of unspecified ligament of left ankle, subsequent encounter: Secondary | ICD-10-CM | POA: Diagnosis not present

## 2015-05-24 DIAGNOSIS — Z6821 Body mass index (BMI) 21.0-21.9, adult: Secondary | ICD-10-CM | POA: Diagnosis not present

## 2015-06-24 DIAGNOSIS — M25562 Pain in left knee: Secondary | ICD-10-CM | POA: Diagnosis not present

## 2015-06-24 DIAGNOSIS — M170 Bilateral primary osteoarthritis of knee: Secondary | ICD-10-CM | POA: Diagnosis not present

## 2015-06-24 DIAGNOSIS — M25561 Pain in right knee: Secondary | ICD-10-CM | POA: Diagnosis not present

## 2015-07-07 DIAGNOSIS — Z23 Encounter for immunization: Secondary | ICD-10-CM | POA: Diagnosis not present

## 2015-09-05 DIAGNOSIS — Z1389 Encounter for screening for other disorder: Secondary | ICD-10-CM | POA: Diagnosis not present

## 2015-09-05 DIAGNOSIS — J069 Acute upper respiratory infection, unspecified: Secondary | ICD-10-CM | POA: Diagnosis not present

## 2015-09-05 DIAGNOSIS — Z6822 Body mass index (BMI) 22.0-22.9, adult: Secondary | ICD-10-CM | POA: Diagnosis not present

## 2015-09-06 ENCOUNTER — Encounter (INDEPENDENT_AMBULATORY_CARE_PROVIDER_SITE_OTHER): Payer: Self-pay | Admitting: *Deleted

## 2015-09-21 ENCOUNTER — Other Ambulatory Visit (INDEPENDENT_AMBULATORY_CARE_PROVIDER_SITE_OTHER): Payer: Self-pay | Admitting: *Deleted

## 2015-09-21 DIAGNOSIS — Z1211 Encounter for screening for malignant neoplasm of colon: Secondary | ICD-10-CM

## 2015-10-07 DIAGNOSIS — M170 Bilateral primary osteoarthritis of knee: Secondary | ICD-10-CM | POA: Diagnosis not present

## 2015-10-10 DIAGNOSIS — Z6820 Body mass index (BMI) 20.0-20.9, adult: Secondary | ICD-10-CM | POA: Diagnosis not present

## 2015-10-10 DIAGNOSIS — J029 Acute pharyngitis, unspecified: Secondary | ICD-10-CM | POA: Diagnosis not present

## 2015-10-10 DIAGNOSIS — Z1389 Encounter for screening for other disorder: Secondary | ICD-10-CM | POA: Diagnosis not present

## 2015-10-10 DIAGNOSIS — J069 Acute upper respiratory infection, unspecified: Secondary | ICD-10-CM | POA: Diagnosis not present

## 2015-10-20 DIAGNOSIS — Z23 Encounter for immunization: Secondary | ICD-10-CM | POA: Diagnosis not present

## 2015-10-25 ENCOUNTER — Telehealth (INDEPENDENT_AMBULATORY_CARE_PROVIDER_SITE_OTHER): Payer: Self-pay | Admitting: *Deleted

## 2015-10-25 DIAGNOSIS — Z1211 Encounter for screening for malignant neoplasm of colon: Secondary | ICD-10-CM

## 2015-10-25 MED ORDER — SUPREP BOWEL PREP KIT 17.5-3.13-1.6 GM/177ML PO SOLN: 1.0000 | Freq: Once | ORAL | Status: DC

## 2015-10-25 NOTE — Telephone Encounter (Signed)
Noted on instruction sheet to hold Meloxicam 2 days

## 2015-10-25 NOTE — Telephone Encounter (Signed)
Agree. Needs to also hold Meloxicam

## 2015-10-25 NOTE — Telephone Encounter (Signed)
Referring MD/PCP: golding   Procedure: tcs  Reason/Indication:  screening  Has patient had this procedure before?  Yes, 11 yrs ago  If so, when, by whom and where?    Is there a family history of colon cancer?  no  Who?  What age when diagnosed?    Is patient diabetic?   no      Does patient have prosthetic heart valve or mechanical valve?  no  Do you have a pacemaker?  no  Has patient ever had endocarditis? no  Has patient had joint replacement within last 12 months?  no  Does patient tend to be constipated or take laxatives? yes  Does patient have a history of alcohol/drug use?  no  Is patient on Coumadin, Plavix and/or Aspirin? yes  Medications: asa 81 mg daily, atenolol 25 mg, losartan/hctz 100-12.5 mg, calcium/vit d, lexapro 5 mg, fluticasone nasal spray. meloxicam 15 mg, MVI, omeprazole 20 mg, citrucel   Allergies: codeine, pcn, sulfa  Medication Adjustment: asa 2 days  Procedure date & time: 11/23/15 at 830

## 2015-10-25 NOTE — Telephone Encounter (Signed)
Patient needs suprep 

## 2015-11-14 DIAGNOSIS — M7062 Trochanteric bursitis, left hip: Secondary | ICD-10-CM | POA: Diagnosis not present

## 2015-11-23 ENCOUNTER — Ambulatory Visit (HOSPITAL_COMMUNITY)
Admission: RE | Admit: 2015-11-23 | Discharge: 2015-11-23 | Disposition: A | Discharge status: Home / Self Care | Payer: Medicare Other | Source: Ambulatory Visit | Attending: Internal Medicine | Admitting: Internal Medicine

## 2015-11-23 ENCOUNTER — Encounter (HOSPITAL_COMMUNITY)
Admission: RE | Disposition: A | Discharge status: Home / Self Care | Payer: Self-pay | Source: Ambulatory Visit | Attending: Internal Medicine

## 2015-11-23 ENCOUNTER — Encounter (HOSPITAL_COMMUNITY): Payer: Self-pay | Admitting: *Deleted

## 2015-11-23 DIAGNOSIS — Z1211 Encounter for screening for malignant neoplasm of colon: Secondary | ICD-10-CM | POA: Insufficient documentation

## 2015-11-23 DIAGNOSIS — K219 Gastro-esophageal reflux disease without esophagitis: Secondary | ICD-10-CM | POA: Diagnosis not present

## 2015-11-23 DIAGNOSIS — Z7951 Long term (current) use of inhaled steroids: Secondary | ICD-10-CM | POA: Insufficient documentation

## 2015-11-23 DIAGNOSIS — D123 Benign neoplasm of transverse colon: Secondary | ICD-10-CM | POA: Diagnosis not present

## 2015-11-23 DIAGNOSIS — I10 Essential (primary) hypertension: Secondary | ICD-10-CM | POA: Diagnosis not present

## 2015-11-23 DIAGNOSIS — Z79899 Other long term (current) drug therapy: Secondary | ICD-10-CM | POA: Diagnosis not present

## 2015-11-23 DIAGNOSIS — Z880 Allergy status to penicillin: Secondary | ICD-10-CM | POA: Insufficient documentation

## 2015-11-23 DIAGNOSIS — Z7982 Long term (current) use of aspirin: Secondary | ICD-10-CM | POA: Diagnosis not present

## 2015-11-23 DIAGNOSIS — F329 Major depressive disorder, single episode, unspecified: Secondary | ICD-10-CM | POA: Insufficient documentation

## 2015-11-23 HISTORY — DX: Major depressive disorder, single episode, unspecified: F32.9

## 2015-11-23 HISTORY — PX: COLONOSCOPY: SHX5424

## 2015-11-23 HISTORY — DX: Depression, unspecified: F32.A

## 2015-11-23 HISTORY — DX: Other seasonal allergic rhinitis: J30.2

## 2015-11-23 HISTORY — DX: Gastro-esophageal reflux disease without esophagitis: K21.9

## 2015-11-23 SURGERY — COLONOSCOPY
Anesthesia: Moderate Sedation

## 2015-11-23 MED ORDER — MIDAZOLAM HCL 5 MG/5ML IJ SOLN
INTRAMUSCULAR | Status: AC
  Filled 2015-11-23: qty 10

## 2015-11-23 MED ORDER — SODIUM CHLORIDE 0.9 % IV SOLN
INTRAVENOUS | Status: DC
  Administered 2015-11-23: 08:00:00 via INTRAVENOUS

## 2015-11-23 MED ORDER — MEPERIDINE HCL 50 MG/ML IJ SOLN
INTRAMUSCULAR | Status: AC
  Filled 2015-11-23: qty 1

## 2015-11-23 MED ORDER — MIDAZOLAM HCL 5 MG/5ML IJ SOLN
INTRAMUSCULAR | Status: DC | PRN
  Administered 2015-11-23: 2 mg via INTRAVENOUS
  Administered 2015-11-23: 1 mg via INTRAVENOUS

## 2015-11-23 MED ORDER — MEPERIDINE HCL 50 MG/ML IJ SOLN
INTRAMUSCULAR | Status: DC | PRN
  Administered 2015-11-23: 15 mg via INTRAVENOUS
  Administered 2015-11-23: 25 mg via INTRAVENOUS

## 2015-11-23 NOTE — Op Note (Signed)
COLONOSCOPY PROCEDURE REPORT  PATIENT:  Claire West  MR#:  [id number] Birthdate:  [birth year], 75 y.o., female Endoscopist:  Dr. Rogene Houston, MD Referred By:  Dr.  Purvis Kilts, MD Procedure Date: 11/23/2015  Procedure:   Colonoscopy  Indications:   Patient is 75 year old Caucasian female was undergoing average risk screening colonoscopy. Last exam was normal 11 years ago.  Informed Consent:  The procedure and risks were reviewed with the patient and informed consent was obtained.  Medications:  Demerol 40 mg IV Versed 3 mg IV  First dose administered at  0828 Last dose administered at  0834  Description of procedure:  After a digital rectal exam was performed, that colonoscope was advanced from the anus through the rectum and colon to the area of the cecum, ileocecal valve and appendiceal orifice. The cecum was deeply intubated. These structures were well-seen and photographed for the record. From the level of the cecum and ileocecal valve, the scope was slowly and cautiously withdrawn. The mucosal surfaces were carefully surveyed utilizing scope tip to flexion to facilitate fold flattening as needed. The scope was pulled down into the rectum where a thorough exam including retroflexion was performed.  Findings:  Prep excellent.  8 mm broad-based polyp hot snare from hepatic flexure. Part of the polyp was coagulator during this process. Polypectomy was complete.  7 x 4 mm polyp hot snared from mid transverse colon.  Mucosa of rest of the colon and rectum was normal.  Single small anal papilla.    Therapeutic/Diagnostic Maneuvers Performed:  See above  Complications:   none  EBL: None  Cecal Withdrawal Time:   11 minutes  Impression:   examination performed to cecum.  8 mm polyp hot snare from hepatic flexure. Part of the polyp was coagulated. Polypectomy complete.  7 x 4 mm polyp hot snared from mid transverse colon.  Recommendations:  Standard instructions  given. No aspirin for 1 week. No NSAIDs for 3 days. I will contact patient with biopsy results and further recommendations.  Wilmer Santillo U  11/23/2015 9:01 AM  CC: Dr. Hilma Favors, Betsy Coder, MD & Dr. Rayne Du ref. provider found

## 2015-11-23 NOTE — H&P (Signed)
Claire West is an 75 y.o. female.   Chief Complaint:  Patient is here for colonoscopy. HPI:   patient is 75 year old Caucasian female who is here for screening colonoscopy. She denies abdominal pain change in bowel habits or rectal bleeding. Last colonoscopy was in December 2005. Family history is negative for CRC.  Past Medical History  Diagnosis Date  . Hypertension   . Seasonal allergies   . GERD (gastroesophageal reflux disease)   . Depression     Past Surgical History  Procedure Laterality Date  . Arthroscopic repair acl    . Abdominal hysterectomy    . Colonoscopy      Family History  Problem Relation Age of Onset  . Lung cancer Mother   . Lung cancer Sister    Social History:  reports that she has never smoked. She does not have any smokeless tobacco history on file. She reports that she drinks alcohol. She reports that she does not use illicit drugs.  Allergies:  Allergies  Allergen Reactions  . Codeine Hives and Rash  . Penicillins Rash    Oral rash/peeling Has patient had a PCN reaction causing immediate rash, facial/tongue/throat swelling, SOB or lightheadedness with hypotension: Yes Has patient had a PCN reaction causing severe rash involving mucus membranes or skin necrosis: No Has patient had a PCN reaction that required hospitalization No Has patient had a PCN reaction occurring within the last 10 years: No If all of the above answers are "NO", then may proceed with Cephalosporin use.   . Sulfa Antibiotics Hives and Rash    Medications Prior to Admission  Medication Sig Dispense Refill  . aspirin EC 81 MG tablet Take 81 mg by mouth daily.    Marland Kitchen atenolol (TENORMIN) 25 MG tablet Take 25 mg by mouth daily.    . Calcium Carbonate-Vitamin D (CALCIUM-D PO) Take 1 tablet by mouth daily.    Marland Kitchen escitalopram (LEXAPRO) 5 MG tablet Take 5 mg by mouth daily.    . fluticasone (FLONASE) 50 MCG/ACT nasal spray Place 2 sprays into both nostrils daily as needed for  allergies.     Marland Kitchen losartan-hydrochlorothiazide (HYZAAR) 100-12.5 MG per tablet Take 1 tablet by mouth daily.    . meloxicam (MOBIC) 15 MG tablet Take 15 mg by mouth daily.    . Multiple Vitamin (MULTIVITAMIN WITH MINERALS) TABS tablet Take 1 tablet by mouth daily.    Marland Kitchen omeprazole (PRILOSEC) 20 MG capsule Take 20 mg by mouth daily.    Manus Gunning BOWEL PREP SOLN Take 1 kit by mouth once. 1 Bottle 0    No results found for this or any previous visit (from the past 48 hour(s)). No results found.  ROS  Blood pressure 147/78, pulse 70, temperature 97.4 F (36.3 C), temperature source Oral, resp. rate 18, height _0  (1.676 m), weight 130 lb (58.968 kg), SpO2 97 %. Physical Exam  Constitutional: She appears well-developed and well-nourished.  HENT:  Mouth/Throat: Oropharynx is clear and moist.  Eyes: Conjunctivae are normal. No scleral icterus.  Neck: No thyromegaly present.  Cardiovascular: Normal rate, regular rhythm and normal heart sounds.   No murmur heard. Respiratory: Effort normal and breath sounds normal.  GI: She exhibits no distension and no mass. There is no tenderness.  Musculoskeletal: She exhibits no edema.  Lymphadenopathy:    She has no cervical adenopathy.  Neurological: She is alert.  Skin: Skin is warm and dry.     Assessment/Plan  average risk screening colonoscopy.  REHMAN,NAJEEB  U, MD 11/23/2015, 8:23 AM

## 2015-11-23 NOTE — Discharge Instructions (Signed)
No aspirin for 1 week. Hold meloxicam for at least 3 days. Resume other medications as before. No driving for 24 hours. Physician will call with biopsy results.  Colonoscopy, Care After These instructions give you information on caring for yourself after your procedure. Your doctor may also give you more specific instructions. Call your doctor if you have any problems or questions after your procedure. HOME CARE  Do not drive for 24 hours.  Do not sign important papers or use machinery for 24 hours.  You may shower.  You may go back to your usual activities, but go slower for the first 24 hours.  Take rest breaks often during the first 24 hours.  Walk around or use warm packs on your belly (abdomen) if you have belly cramping or gas.  Drink enough fluids to keep your pee (urine) clear or pale yellow.  Resume your normal diet. Avoid heavy or fried foods.  Avoid drinking alcohol for 24 hours or as told by your doctor.  Only take medicines as told by your doctor. If a tissue sample (biopsy) was taken during the procedure:   Do not take aspirin or blood thinners for 7 days, or as told by your doctor.  Do not drink alcohol for 7 days, or as told by your doctor.  Eat soft foods for the first 24 hours. GET HELP IF: You still have a small amount of blood in your poop (stool) 2-3 days after the procedure. GET HELP RIGHT AWAY IF:  You have more than a small amount of blood in your poop.  You see clumps of tissue (blood clots) in your poop.  Your belly is puffy (swollen).  You feel sick to your stomach (nauseous) or throw up (vomit).  You have a fever.  You have belly pain that gets worse and medicine does not help. MAKE SURE YOU:  Understand these instructions.  Will watch your condition.  Will get help right away if you are not doing well or get worse.   This information is not intended to replace advice given to you by your health care provider. Make sure you  discuss any questions you have with your health care provider.   Document Released: 10/27/2010 Document Revised: 09/29/2013 Document Reviewed: 06/01/2013 Elsevier Interactive Patient Education [employer].

## 2015-11-25 ENCOUNTER — Encounter (HOSPITAL_COMMUNITY): Payer: Self-pay | Admitting: Internal Medicine

## 2015-12-01 DIAGNOSIS — M7062 Trochanteric bursitis, left hip: Secondary | ICD-10-CM | POA: Diagnosis not present

## 2015-12-15 DIAGNOSIS — M7062 Trochanteric bursitis, left hip: Secondary | ICD-10-CM | POA: Diagnosis not present

## 2016-01-12 DIAGNOSIS — M7062 Trochanteric bursitis, left hip: Secondary | ICD-10-CM | POA: Diagnosis not present

## 2016-01-12 DIAGNOSIS — M170 Bilateral primary osteoarthritis of knee: Secondary | ICD-10-CM | POA: Diagnosis not present

## 2016-01-12 DIAGNOSIS — M1712 Unilateral primary osteoarthritis, left knee: Secondary | ICD-10-CM | POA: Diagnosis not present

## 2016-01-12 DIAGNOSIS — M1711 Unilateral primary osteoarthritis, right knee: Secondary | ICD-10-CM | POA: Diagnosis not present

## 2016-01-17 DIAGNOSIS — Z1389 Encounter for screening for other disorder: Secondary | ICD-10-CM | POA: Diagnosis not present

## 2016-01-17 DIAGNOSIS — Z0000 Encounter for general adult medical examination without abnormal findings: Secondary | ICD-10-CM | POA: Diagnosis not present

## 2016-01-17 DIAGNOSIS — E782 Mixed hyperlipidemia: Secondary | ICD-10-CM | POA: Diagnosis not present

## 2016-01-17 DIAGNOSIS — Z6821 Body mass index (BMI) 21.0-21.9, adult: Secondary | ICD-10-CM | POA: Diagnosis not present

## 2016-01-17 DIAGNOSIS — R5383 Other fatigue: Secondary | ICD-10-CM | POA: Diagnosis not present

## 2016-01-24 ENCOUNTER — Other Ambulatory Visit (HOSPITAL_COMMUNITY): Payer: Self-pay | Admitting: Internal Medicine

## 2016-02-06 DIAGNOSIS — Z6821 Body mass index (BMI) 21.0-21.9, adult: Secondary | ICD-10-CM | POA: Diagnosis not present

## 2016-02-06 DIAGNOSIS — N342 Other urethritis: Secondary | ICD-10-CM | POA: Diagnosis not present

## 2016-02-12 ENCOUNTER — Encounter (HOSPITAL_COMMUNITY): Payer: Self-pay | Admitting: Emergency Medicine

## 2016-02-12 ENCOUNTER — Observation Stay (HOSPITAL_COMMUNITY)
Admission: EM | Admit: 2016-02-12 | Discharge: 2016-02-13 | Disposition: A | Discharge status: Home / Self Care | Payer: Medicare Other | Attending: Internal Medicine | Admitting: Internal Medicine

## 2016-02-12 DIAGNOSIS — R5383 Other fatigue: Secondary | ICD-10-CM | POA: Diagnosis present

## 2016-02-12 DIAGNOSIS — K219 Gastro-esophageal reflux disease without esophagitis: Secondary | ICD-10-CM

## 2016-02-12 DIAGNOSIS — R945 Abnormal results of liver function studies: Secondary | ICD-10-CM | POA: Insufficient documentation

## 2016-02-12 DIAGNOSIS — E876 Hypokalemia: Secondary | ICD-10-CM | POA: Diagnosis not present

## 2016-02-12 DIAGNOSIS — Z79899 Other long term (current) drug therapy: Secondary | ICD-10-CM | POA: Diagnosis not present

## 2016-02-12 DIAGNOSIS — R197 Diarrhea, unspecified: Secondary | ICD-10-CM | POA: Insufficient documentation

## 2016-02-12 DIAGNOSIS — F329 Major depressive disorder, single episode, unspecified: Secondary | ICD-10-CM | POA: Insufficient documentation

## 2016-02-12 DIAGNOSIS — R7989 Other specified abnormal findings of blood chemistry: Secondary | ICD-10-CM

## 2016-02-12 DIAGNOSIS — E871 Hypo-osmolality and hyponatremia: Secondary | ICD-10-CM | POA: Insufficient documentation

## 2016-02-12 DIAGNOSIS — I10 Essential (primary) hypertension: Secondary | ICD-10-CM | POA: Diagnosis not present

## 2016-02-12 DIAGNOSIS — E860 Dehydration: Secondary | ICD-10-CM | POA: Diagnosis present

## 2016-02-12 LAB — CBC WITH DIFFERENTIAL/PLATELET
BASOS ABS: 0 10*3/uL (ref 0.0–0.1)
Basophils Relative: 0 %
EOS ABS: 0 10*3/uL (ref 0.0–0.7)
Eosinophils Relative: 0 %
HCT: 33.1 % — ABNORMAL LOW (ref 36.0–46.0)
Hemoglobin: 11.8 g/dL — ABNORMAL LOW (ref 12.0–15.0)
LYMPHS ABS: 0.6 10*3/uL — AB (ref 0.7–4.0)
Lymphocytes Relative: 7 %
MCH: 33 pg (ref 26.0–34.0)
MCHC: 35.6 g/dL (ref 30.0–36.0)
MCV: 92.5 fL (ref 78.0–100.0)
MONO ABS: 0.4 10*3/uL (ref 0.1–1.0)
Monocytes Relative: 4 %
NEUTROS PCT: 89 %
Neutro Abs: 8 10*3/uL — ABNORMAL HIGH (ref 1.7–7.7)
Platelets: 186 10*3/uL (ref 150–400)
RBC: 3.58 MIL/uL — AB (ref 3.87–5.11)
RDW: 11.8 % (ref 11.5–15.5)
WBC: 9 10*3/uL (ref 4.0–10.5)

## 2016-02-12 LAB — COMPREHENSIVE METABOLIC PANEL
ALT: 265 U/L — AB (ref 14–54)
AST: 211 U/L — AB (ref 15–41)
Albumin: 3.5 g/dL (ref 3.5–5.0)
Alkaline Phosphatase: 213 U/L — ABNORMAL HIGH (ref 38–126)
Anion gap: 12 (ref 5–15)
BILIRUBIN TOTAL: 3 mg/dL — AB (ref 0.3–1.2)
BUN: 9 mg/dL (ref 6–20)
CO2: 27 mmol/L (ref 22–32)
CREATININE: 0.75 mg/dL (ref 0.44–1.00)
Calcium: 9 mg/dL (ref 8.9–10.3)
Chloride: 88 mmol/L — ABNORMAL LOW (ref 101–111)
Glucose, Bld: 130 mg/dL — ABNORMAL HIGH (ref 65–99)
Potassium: 2.8 mmol/L — ABNORMAL LOW (ref 3.5–5.1)
Sodium: 127 mmol/L — ABNORMAL LOW (ref 135–145)
TOTAL PROTEIN: 7.3 g/dL (ref 6.5–8.1)

## 2016-02-12 LAB — URINALYSIS, ROUTINE W REFLEX MICROSCOPIC
Glucose, UA: NEGATIVE mg/dL
Hgb urine dipstick: NEGATIVE
KETONES UR: NEGATIVE mg/dL
NITRITE: NEGATIVE
PH: 6 (ref 5.0–8.0)
Protein, ur: NEGATIVE mg/dL

## 2016-02-12 LAB — MAGNESIUM: Magnesium: 1.7 mg/dL (ref 1.7–2.4)

## 2016-02-12 LAB — URINE MICROSCOPIC-ADD ON

## 2016-02-12 MED ORDER — MELOXICAM 7.5 MG PO TABS
15.0000 mg | ORAL_TABLET | Freq: Every day | ORAL | Status: DC
  Administered 2016-02-13: 15 mg via ORAL
  Filled 2016-02-12 (×2): qty 2
  Filled 2016-02-12: qty 1

## 2016-02-12 MED ORDER — ACETAMINOPHEN 650 MG RE SUPP: 650.0000 mg | Freq: Four times a day (QID) | RECTAL | Status: DC | PRN

## 2016-02-12 MED ORDER — LOSARTAN POTASSIUM-HCTZ 100-12.5 MG PO TABS: 1.0000 | ORAL_TABLET | Freq: Every day | ORAL | Status: DC

## 2016-02-12 MED ORDER — LOSARTAN POTASSIUM 50 MG PO TABS
100.0000 mg | ORAL_TABLET | Freq: Every day | ORAL | Status: DC
  Administered 2016-02-13: 100 mg via ORAL
  Filled 2016-02-12: qty 2

## 2016-02-12 MED ORDER — SODIUM CHLORIDE 0.9 % IV BOLUS (SEPSIS)
500.0000 mL | Freq: Once | INTRAVENOUS | Status: AC
  Administered 2016-02-12: 500 mL via INTRAVENOUS

## 2016-02-12 MED ORDER — HYDROCHLOROTHIAZIDE 12.5 MG PO CAPS
12.5000 mg | ORAL_CAPSULE | Freq: Every day | ORAL | Status: DC
  Administered 2016-02-13: 12.5 mg via ORAL
  Filled 2016-02-12: qty 1

## 2016-02-12 MED ORDER — POTASSIUM CHLORIDE IN NACL 40-0.9 MEQ/L-% IV SOLN
INTRAVENOUS | Status: DC
  Administered 2016-02-12 – 2016-02-13 (×3): 125 mL/h via INTRAVENOUS

## 2016-02-12 MED ORDER — SODIUM CHLORIDE 0.9% FLUSH
3.0000 mL | Freq: Two times a day (BID) | INTRAVENOUS | Status: DC
  Administered 2016-02-13: 3 mL via INTRAVENOUS

## 2016-02-12 MED ORDER — PANTOPRAZOLE SODIUM 40 MG PO TBEC
40.0000 mg | DELAYED_RELEASE_TABLET | Freq: Every day | ORAL | Status: DC
  Administered 2016-02-12 – 2016-02-13 (×2): 40 mg via ORAL
  Filled 2016-02-12 (×2): qty 1

## 2016-02-12 MED ORDER — ATENOLOL 25 MG PO TABS
25.0000 mg | ORAL_TABLET | Freq: Every day | ORAL | Status: DC
  Administered 2016-02-13: 25 mg via ORAL
  Filled 2016-02-12: qty 1

## 2016-02-12 MED ORDER — ACETAMINOPHEN 325 MG PO TABS: 650.0000 mg | ORAL_TABLET | Freq: Four times a day (QID) | ORAL | Status: DC | PRN

## 2016-02-12 MED ORDER — ONDANSETRON HCL 4 MG/2ML IJ SOLN: 4.0000 mg | Freq: Three times a day (TID) | INTRAMUSCULAR | Status: DC | PRN

## 2016-02-12 MED ORDER — POTASSIUM CHLORIDE 10 MEQ/100ML IV SOLN
10.0000 meq | Freq: Once | INTRAVENOUS | Status: AC
  Administered 2016-02-12: 10 meq via INTRAVENOUS
  Filled 2016-02-12: qty 100

## 2016-02-12 MED ORDER — SODIUM CHLORIDE 0.9 % IV SOLN
INTRAVENOUS | Status: DC
Start: 2016-02-12 — End: 2016-02-12

## 2016-02-12 MED ORDER — ONDANSETRON HCL 4 MG PO TABS: 4.0000 mg | ORAL_TABLET | Freq: Four times a day (QID) | ORAL | Status: DC | PRN

## 2016-02-12 MED ORDER — POTASSIUM CHLORIDE CRYS ER 20 MEQ PO TBCR
40.0000 meq | EXTENDED_RELEASE_TABLET | ORAL | Status: AC
  Administered 2016-02-12 (×2): 40 meq via ORAL
  Filled 2016-02-12 (×2): qty 2

## 2016-02-12 MED ORDER — ONDANSETRON HCL 4 MG/2ML IJ SOLN: 4.0000 mg | Freq: Four times a day (QID) | INTRAMUSCULAR | Status: DC | PRN

## 2016-02-12 MED ORDER — ENOXAPARIN SODIUM 40 MG/0.4ML ~~LOC~~ SOLN
40.0000 mg | SUBCUTANEOUS | Status: DC
  Administered 2016-02-12: 40 mg via SUBCUTANEOUS
  Filled 2016-02-12: qty 0.4

## 2016-02-12 NOTE — ED Provider Notes (Signed)
CSN: [id number]     Arrival date & time 02/12/16  1227 History   First MD Initiated Contact with Patient 02/12/16 1246     Chief Complaint  Patient presents with  . Fatigue      HPI Patient c/o faitgue, nausea, and loose stools. Family states that a week and a half ago patient started having urinary symptoms and started taking pyridium, symptoms not improving so patient went to PCP and diagnosed with Kidney and bladder infection. Patient was started on Cipro. Cipro gave patient headache and made her nauseated, medication was changed to Macrobid and symptoms are not any better. Patient now fatigue, nauseated, with, periods of disorientation per family. Past Medical History  Diagnosis Date  . Hypertension   . Seasonal allergies   . GERD (gastroesophageal reflux disease)   . Depression    Past Surgical History  Procedure Laterality Date  . Arthroscopic repair acl    . Abdominal hysterectomy    . Colonoscopy    . Colonoscopy N/A 11/23/2015    Procedure: COLONOSCOPY;  Surgeon: Rogene [place], MD;  Location: [facility];  Service: Endoscopy;  Laterality: N/A;  74   Family History  Problem Relation Age of Onset  . Lung cancer Mother   . Lung cancer Sister    Social History  Substance Use Topics  . Smoking status: Never Smoker   . Smokeless tobacco: None  . Alcohol Use: Yes     Comment: occasional wine   OB History    No data available     Review of Systems  Constitutional: Positive for fatigue. Negative for fever, chills and unexpected weight change.  Gastrointestinal: Positive for nausea and diarrhea.  All other systems reviewed and are negative.     Allergies  Codeine; Penicillins; and Sulfa antibiotics  Home Medications   Prior to Admission medications   Medication Sig Start Date End Date Taking? Authorizing Provider  atenolol (TENORMIN) 25 MG tablet Take 25 mg by mouth daily. 02/18/15  Yes Historical Provider, MD  Calcium Carbonate-Vitamin D (CALCIUM-D PO) Take  1 tablet by mouth daily.   Yes Historical Provider, MD  escitalopram (LEXAPRO) 5 MG tablet Take 5 mg by mouth at bedtime.  04/23/15  Yes Historical Provider, MD  fluticasone (FLONASE) 50 MCG/ACT nasal spray Place 2 sprays into both nostrils daily as needed for allergies.  04/16/15  Yes Historical Provider, MD  losartan-hydrochlorothiazide (HYZAAR) 100-12.5 MG per tablet Take 1 tablet by mouth daily. 04/16/15  Yes Historical Provider, MD  meloxicam (MOBIC) 15 MG tablet Take 1 tablet by mouth daily. 01/18/16  Yes Historical Provider, MD  Multiple Vitamin (MULTIVITAMIN WITH MINERALS) TABS tablet Take 1 tablet by mouth daily.   Yes Historical Provider, MD  nitrofurantoin, macrocrystal-monohydrate, (MACROBID) 100 MG capsule Take 1 capsule by mouth 2 (two) times daily. Starting 02/08/2016 x 7 days for bladder/kidney infection. 02/08/16  Yes Historical Provider, MD  omeprazole (PRILOSEC) 20 MG capsule Take 20 mg by mouth every other day.  04/18/15  Yes Historical Provider, MD  ciprofloxacin (CIPRO) 500 MG tablet Take 1 tablet by mouth 2 (two) times daily. Starting 02/06/2016 x 7 days for bladder/kidney infection. 02/06/16   Historical Provider, MD   BP 143/53 mmHg  Pulse 101  Temp(Src) 98 F (36.7 C) (Oral)  Resp 18  Ht 5\' 6"  (1.676 m)  Wt 128 lb (58.06 kg)  BMI 20.67 kg/m2  SpO2 100% Physical Exam Physical Exam  Nursing note and vitals reviewed. Constitutional: She is oriented to  person, place, and time. She appears well-developed and well-nourished. No distress.  HENT:  Head: Normocephalic and atraumatic.  Eyes: Pupils are equal, round, and reactive to light.  Neck: Normal range of motion.  Cardiovascular: Normal rate and intact distal pulses.   Pulmonary/Chest: No respiratory distress.  Abdominal: Normal appearance. She exhibits no distension.  Musculoskeletal: Normal range of motion.  Neurological: She is alert and oriented to person, place, and time. No cranial nerve deficit.  Skin: Skin is warm and  dry. No rash noted.  Psychiatric: She has a normal mood and affect. Her behavior is normal.   ED Course  Procedures (including critical care time) Medications  potassium chloride 10 mEq in 100 mL IVPB (not administered)  sodium chloride 0.9 % bolus 500 mL (0 mLs Intravenous Stopped 02/12/16 1353)    Labs Review Labs Reviewed  COMPREHENSIVE METABOLIC PANEL - Abnormal; Notable for the following:    Sodium 127 (*)    Potassium 2.8 (*)    Chloride 88 (*)    Glucose, Bld 130 (*)    AST 211 (*)    ALT 265 (*)    Alkaline Phosphatase 213 (*)    Total Bilirubin 3.0 (*)    All other components within normal limits  CBC WITH DIFFERENTIAL/PLATELET - Abnormal; Notable for the following:    RBC 3.58 (*)    Hemoglobin 11.8 (*)    HCT 33.1 (*)    All other components within normal limits  URINALYSIS, ROUTINE W REFLEX MICROSCOPIC (NOT AT [facility]) - Abnormal; Notable for the following:    Specific Gravity, Urine <1.005 (*)    Bilirubin Urine MODERATE (*)    Leukocytes, UA TRACE (*)    All other components within normal limits  URINE MICROSCOPIC-ADD ON - Abnormal; Notable for the following:    Squamous Epithelial / LPF 0-5 (*)    Bacteria, UA FEW (*)    All other components within normal limits  MAGNESIUM    Imaging Review No results found. I have personally reviewed and evaluated these images and lab results as part of my medical decision-making.    MDM   Final diagnoses:  Elevated LFTs  Hypokalemia  Hyponatremia        Leonard Schwartz, MD 02/12/16 1404

## 2016-02-12 NOTE — H&P (Signed)
History and Physical    Claire West [id number] DOB: 19-Oct-1940 DOA: 02/12/2016  Referring MD/NP/PA: Dr. Audie Pinto, ER PCP: Purvis Kilts, MD  Outpatient Specialists:  Patient coming from: home  Chief Complaint: fatigue  HPI: Claire West is a 75 y.o. female with medical history significant of who was recently diagnosed with a urinary tract infection by her primary care physician. Approximately 1 week ago, she was diagnosed with a UTI and started on a course of Cipro. She took this for 2-3 days and developed a headache. She called her primary care physician who then changed her prescription of Macrobid. After starting this new antibiotic, she began feeling fatigued, nauseous, no vomiting, she was lightheaded, increasingly confused, was driving erratically. She felt very weak and felt that she may be dehydrated. Her family brought her to the hospital for evaluation. She's not had any fevers recently. No cough, shortness of breath, chest pain. Her dysuria from UTI has resolved.  ED Course: Patient was evaluated in the emergency room where her vitals were found to be stable. She is set to be hyponatremic, hypokalemic and evidence of dehydration. Urinalysis did not show any signs of infection. She was also found to have abnormal liver enzymes. She's been referred for overnight observation.  Review of Systems: pertinent positives as per HPI, otherwise negative  Past Medical History  Diagnosis Date  . Hypertension   . Seasonal allergies   . GERD (gastroesophageal reflux disease)   . Depression     Past Surgical History  Procedure Laterality Date  . Arthroscopic repair acl    . Abdominal hysterectomy    . Colonoscopy    . Colonoscopy N/A 11/23/2015    Procedure: COLONOSCOPY;  Surgeon: Rogene [place], MD;  Location: [facility];  Service: Endoscopy;  Laterality: N/A;  830     reports that she has never smoked. She does not have any smokeless tobacco history on file. She  reports that she drinks alcohol. She reports that she does not use illicit drugs.  Allergies  Allergen Reactions  . Codeine Hives and Rash  . Penicillins Rash    Oral rash/peeling Has patient had a PCN reaction causing immediate rash, facial/tongue/throat swelling, SOB or lightheadedness with hypotension: Yes Has patient had a PCN reaction causing severe rash involving mucus membranes or skin necrosis: No Has patient had a PCN reaction that required hospitalization No Has patient had a PCN reaction occurring within the last 10 years: No If all of the above answers are "NO", then may proceed with Cephalosporin use.   . Sulfa Antibiotics Hives and Rash    Family History  Problem Relation Age of Onset  . Lung cancer Mother   . Lung cancer Sister      Prior to Admission medications   Medication Sig Start Date End Date Taking? Authorizing Provider  atenolol (TENORMIN) 25 MG tablet Take 25 mg by mouth daily. 02/18/15  Yes Historical Provider, MD  Calcium Carbonate-Vitamin D (CALCIUM-D PO) Take 1 tablet by mouth daily.   Yes Historical Provider, MD  escitalopram (LEXAPRO) 5 MG tablet Take 5 mg by mouth at bedtime.  04/23/15  Yes Historical Provider, MD  fluticasone (FLONASE) 50 MCG/ACT nasal spray Place 2 sprays into both nostrils daily as needed for allergies.  04/16/15  Yes Historical Provider, MD  losartan-hydrochlorothiazide (HYZAAR) 100-12.5 MG per tablet Take 1 tablet by mouth daily. 04/16/15  Yes Historical Provider, MD  meloxicam (MOBIC) 15 MG tablet Take 1 tablet by mouth daily.  01/18/16  Yes Historical Provider, MD  Multiple Vitamin (MULTIVITAMIN WITH MINERALS) TABS tablet Take 1 tablet by mouth daily.   Yes Historical Provider, MD  omeprazole (PRILOSEC) 20 MG capsule Take 20 mg by mouth every other day.  04/18/15  Yes Historical Provider, MD    Physical Exam: Filed Vitals:   02/12/16 1413 02/12/16 1430 02/12/16 1500 02/12/16 1538  BP: 153/71 145/73 161/75 160/75  Pulse: 82   92    Temp:      TempSrc:      Resp: 11 17 18    Height:    5\' 6"  (1.676 m)  Weight:    57 kg (125 lb 10.6 oz)  SpO2: 98%   97%      Constitutional: NAD, calm, comfortable Filed Vitals:   02/12/16 1413 02/12/16 1430 02/12/16 1500 02/12/16 1538  BP: 153/71 145/73 161/75 160/75  Pulse: 82   92  Temp:      TempSrc:      Resp: 11 17 18    Height:    5\' 6"  (1.676 m)  Weight:    57 kg (125 lb 10.6 oz)  SpO2: 98%   97%   Eyes: PERRL, lids and conjunctivae normal ENMT: Mucous membranes are moist. Posterior pharynx clear of any exudate or lesions.Normal dentition.  Neck: normal, supple, no masses, no thyromegaly Respiratory: clear to auscultation bilaterally, no wheezing, no crackles. Normal respiratory effort. No accessory muscle use.  Cardiovascular: Regular rate and rhythm, no murmurs / rubs / gallops. No extremity edema. 2+ pedal pulses. No carotid bruits.  Abdomen: soft, no tenderness, no masses palpated. No hepatosplenomegaly. Bowel sounds positive.  Musculoskeletal: no clubbing / cyanosis. No joint deformity upper and lower extremities. Good ROM, no contractures. Normal muscle tone.  Skin: no rashes, lesions, ulcers. No induration Neurologic: CN 2-12 grossly intact. Sensation intact, DTR normal. Strength 5/5 in all 4.  Psychiatric: Normal judgment and insight. Alert and oriented x 3. Normal mood.    Labs on Admission: I have personally reviewed following labs and imaging studies  CBC:  Recent Labs Lab 02/12/16 1310  WBC 9.0  NEUTROABS 8.0*  HGB 11.8*  HCT 33.1*  MCV 92.5  PLT 99991111   Basic Metabolic Panel:  Recent Labs Lab 02/12/16 1310  NA 127*  K 2.8*  CL 88*  CO2 27  GLUCOSE 130*  BUN 9  CREATININE 0.75  CALCIUM 9.0  MG 1.7   GFR: Estimated Creatinine Clearance: 55.5 mL/min (by C-G formula based on Cr of 0.75). Liver Function Tests:  Recent Labs Lab 02/12/16 1310  AST 211*  ALT 265*  ALKPHOS 213*  BILITOT 3.0*  PROT 7.3  ALBUMIN 3.5   No results  for input(s): LIPASE, AMYLASE in the last 168 hours. No results for input(s): AMMONIA in the last 168 hours. Coagulation Profile: No results for input(s): INR, PROTIME in the last 168 hours. Cardiac Enzymes: No results for input(s): CKTOTAL, CKMB, CKMBINDEX, TROPONINI in the last 168 hours. BNP (last 3 results) No results for input(s): PROBNP in the last 8760 hours. HbA1C: No results for input(s): HGBA1C in the last 72 hours. CBG: No results for input(s): GLUCAP in the last 168 hours. Lipid Profile: No results for input(s): CHOL, HDL, LDLCALC, TRIG, CHOLHDL, LDLDIRECT in the last 72 hours. Thyroid Function Tests: No results for input(s): TSH, T4TOTAL, FREET4, T3FREE, THYROIDAB in the last 72 hours. Anemia Panel: No results for input(s): VITAMINB12, FOLATE, FERRITIN, TIBC, IRON, RETICCTPCT in the last 72 hours. Urine analysis:  Component Value Date/Time   COLORURINE YELLOW 02/12/2016 1310   APPEARANCEUR CLEAR 02/12/2016 1310   LABSPEC <1.005* 02/12/2016 1310   PHURINE 6.0 02/12/2016 1310   GLUCOSEU NEGATIVE 02/12/2016 1310   HGBUR NEGATIVE 02/12/2016 1310   BILIRUBINUR MODERATE* 02/12/2016 1310   KETONESUR NEGATIVE 02/12/2016 1310   PROTEINUR NEGATIVE 02/12/2016 1310   NITRITE NEGATIVE 02/12/2016 1310   LEUKOCYTESUR TRACE* 02/12/2016 1310   Sepsis Labs: @LABRCNTIP (procalcitonin:4,lacticidven:4) )No results found for this or any previous visit (from the past 240 hour(s)).   Radiological Exams on Admission: No results found.  Assessment/Plan Active Problems:   Hyponatremia   Hypokalemia   Dehydration   Elevated LFTs   HTN (hypertension)   GERD (gastroesophageal reflux disease)  1. Hyponatremia. Likely related to decreased by mouth intake and concomitant use of hydrochlorothiazide. We'll start the patient on IV hydration with saline.  2. Hypokalemia. Likely related to decreased intake. Will replace. Magnesium is 1.7.  3. Dehydration. Replace with IV fluids.  4.  Nausea. Suspect this is related to her antibiotic. Will hold further Macrobid since her urinalysis is not showing further infection and she is asymptomatic. Treat with antiemetics.  5. Elevated liver enzymes. Possibly related to dehydration versus antibiotic. Abdomen appears benign. Check hepatitis panel as well as abdominal ultrasound. Repeat labs in a.m .  6. GERD. Continue PPI  DVT prophylaxis: lovenox Code Status: full code Family Communication: discussed with patient Disposition Plan: discharge home when improved Consults called:  Admission status: observation, telemetry   MEMON,JEHANZEB MD [facility] Pager [phone]  If 7PM-7AM, please contact night-coverage [email or web address] Password [facility]  02/12/2016, 5:58 PM

## 2016-02-12 NOTE — ED Notes (Signed)
Patient c/o faitgue, nausea, and loose stools. Family states that a week and a half ago patient started having urinary symptoms and started taking pyridium, symptoms not improving so patient went to PCP and diagnosed with Kidney and bladder infection. Patient was started on Cipro. Cipro gave patient headache and made her nauseated, medication was changed to Macrobid and symptoms are not any better. Patient now fatigue, nauseated, with, periods of disorientation per family.

## 2016-02-13 ENCOUNTER — Observation Stay (HOSPITAL_COMMUNITY): Payer: Medicare Other

## 2016-02-13 DIAGNOSIS — E860 Dehydration: Secondary | ICD-10-CM | POA: Diagnosis not present

## 2016-02-13 DIAGNOSIS — R109 Unspecified abdominal pain: Secondary | ICD-10-CM | POA: Diagnosis not present

## 2016-02-13 DIAGNOSIS — R7989 Other specified abnormal findings of blood chemistry: Secondary | ICD-10-CM | POA: Diagnosis not present

## 2016-02-13 DIAGNOSIS — K219 Gastro-esophageal reflux disease without esophagitis: Secondary | ICD-10-CM | POA: Diagnosis not present

## 2016-02-13 DIAGNOSIS — E876 Hypokalemia: Secondary | ICD-10-CM | POA: Diagnosis not present

## 2016-02-13 DIAGNOSIS — I10 Essential (primary) hypertension: Secondary | ICD-10-CM | POA: Diagnosis not present

## 2016-02-13 LAB — CBC
HEMATOCRIT: 27.3 % — AB (ref 36.0–46.0)
HEMOGLOBIN: 9.6 g/dL — AB (ref 12.0–15.0)
MCH: 33.4 pg (ref 26.0–34.0)
MCHC: 35.2 g/dL (ref 30.0–36.0)
MCV: 95.1 fL (ref 78.0–100.0)
Platelets: 188 10*3/uL (ref 150–400)
RBC: 2.87 MIL/uL — ABNORMAL LOW (ref 3.87–5.11)
RDW: 12.2 % (ref 11.5–15.5)
WBC: 4.3 10*3/uL (ref 4.0–10.5)

## 2016-02-13 LAB — COMPREHENSIVE METABOLIC PANEL
ALT: 179 U/L — ABNORMAL HIGH (ref 14–54)
ANION GAP: 6 (ref 5–15)
AST: 126 U/L — ABNORMAL HIGH (ref 15–41)
Albumin: 2.6 g/dL — ABNORMAL LOW (ref 3.5–5.0)
Alkaline Phosphatase: 159 U/L — ABNORMAL HIGH (ref 38–126)
BILIRUBIN TOTAL: 2.3 mg/dL — AB (ref 0.3–1.2)
BUN: 6 mg/dL (ref 6–20)
CO2: 23 mmol/L (ref 22–32)
Calcium: 8.4 mg/dL — ABNORMAL LOW (ref 8.9–10.3)
Chloride: 106 mmol/L (ref 101–111)
Creatinine, Ser: 0.61 mg/dL (ref 0.44–1.00)
GFR calc Af Amer: >60 mL/min (ref >60)
Glucose, Bld: 97 mg/dL (ref 65–99)
POTASSIUM: 4.7 mmol/L (ref 3.5–5.1)
Sodium: 135 mmol/L (ref 135–145)
TOTAL PROTEIN: 5.5 g/dL — AB (ref 6.5–8.1)

## 2016-02-13 NOTE — Care Management Note (Signed)
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 Pt is ind with ADL's. Pt has PCP, transportation and no difficulty obtaining medications. Pt plans to return home with self care at DC.   Action/Plan: Pt discharging home today. No CM needs.   Expected Discharge Date:  02/13/16               Expected Discharge Plan:  Home/Self Care  In-House Referral:  NA  Discharge planning Services  CM Consult  Post Acute Care Choice:  NA Choice offered to:  NA  DME Arranged:    DME Agency:     HH Arranged:    HH Agency:     Status of Service:  Completed, signed off  Medicare Important Message Given:    Date Medicare IM Given:    Medicare IM give by:    Date Additional Medicare IM Given:    Additional Medicare Important Message give by:     If discussed at [place] of Stay Meetings, dates discussed:    Additional Comments:  [name], RN 02/13/2016, 1:13 PM

## 2016-02-13 NOTE — Care Management Obs Status (Signed)
[place] NOTIFICATION   Patient Details  Name: GWENETTA KRIEBEL MRN: [id number] Date of Birth: 07-15-1941   Medicare Observation Status Notification Given:  Yes    [name], RN 02/13/2016, 1:08 PM

## 2016-02-13 NOTE — Progress Notes (Signed)
Patient alert and oriented, independent, VSS, pt. Tolerating diet well. No complaints of pain or nausea. Pt. Had IV removed tip intact. Pt. Had prescriptions given. Pt. Voiced understanding of discharge instructions with no further questions.  

## 2016-02-13 NOTE — Discharge Summary (Signed)
Physician Discharge Summary  Claire West [id number] DOB: 02/16/1941 DOA: 02/12/2016  PCP: Purvis Kilts, MD  Admit date: 02/12/2016 Discharge date: 02/13/2016  Time spent: 35 minutes  Recommendations for Outpatient Follow-up:  1. Follow up with PCP in 1-2 weeks. Recommend repeat CMP to follow up liver function.   Discharge Diagnoses:  Active Problems:   Hyponatremia   Hypokalemia   Dehydration   Elevated LFTs   HTN (hypertension)   GERD (gastroesophageal reflux disease)   Discharge Condition: Improved.   Diet recommendation: Heart healthy   Filed Weights   02/12/16 1233 02/12/16 1538  Weight: 58.06 kg (128 lb) 57 kg (125 lb 10.6 oz)    History of present illness:  Claire West is a 75 y.o. female with medical history significant of who was recently diagnosed with a urinary tract infection by her primary care physician. Approximately 1 week ago, she was diagnosed with a UTI and started on a course of Cipro. She took this for 2-3 days and developed a headache. She called her primary care physician who then changed her prescription of Macrobid. After starting this new antibiotic, she began feeling fatigued, nauseous, no vomiting, she was lightheaded, increasingly confused, was driving erratically. She felt very weak and felt that she may be dehydrated. Her family brought her to the hospital for evaluation. She's not had any fevers recently. No cough, shortness of breath, chest pain. Her dysuria from UTI has resolved. Patient was evaluated in the emergency room where her vitals were found to be stable. She is set to be hyponatremic, hypokalemic and evidence of dehydration. Urinalysis did not show any signs of infection. She was also found to have abnormal liver enzymes. She was referred for overnight observation  Hospital Course:  [name] was admitted for hyponatremia. This was felt to be related to decreased by mouth intake and concomitant use of hydrochlorothiazide.  She was started on IV hydration with saline with resolution of sodium.  1. Hypokalemia. Likely related to decreased intake. Resolved. Magnesium wnl. 2. Dehydration. Resolved with IVF.  3. Nausea, resolved. Suspect this is related to her antibiotic. Discontinued Macrobid since her urinalysis did not show further infection and she was asymptomatic. Treated with antiemetics. She is tolerating a solid diet without any vomiting. 4. Elevated liver enzymes. Possibly related to dehydration versus antibiotic. Abdomen appears benign. Abdominal ultrasound unremarkable. Recommend follow up liver enzymes in 1-2 weeks with PCP. 5. GERD. Continue PPI  Procedures:  None  Consultations:  None  Discharge Exam: Filed Vitals:   02/12/16 2258 02/13/16 0544  BP: 156/71 148/69  Pulse: 92 84  Temp: 98.2 F (36.8 C) 98.1 F (36.7 C)  Resp: 18 18   General: NAD, looks comfortable Cardiovascular: RRR, S1, S2  Respiratory: clear bilaterally, No wheezing, rales or rhonchi Abdomen: soft, non tender, no distention , bowel sounds normal Musculoskeletal: No edema b/l Discharge Instructions    Current Discharge Medication List    CONTINUE these medications which have NOT CHANGED   Details  atenolol (TENORMIN) 25 MG tablet Take 25 mg by mouth daily.    Calcium Carbonate-Vitamin D (CALCIUM-D PO) Take 1 tablet by mouth daily.    escitalopram (LEXAPRO) 5 MG tablet Take 5 mg by mouth at bedtime.     fluticasone (FLONASE) 50 MCG/ACT nasal spray Place 2 sprays into both nostrils daily as needed for allergies.     losartan-hydrochlorothiazide (HYZAAR) 100-12.5 MG per tablet Take 1 tablet by mouth daily.    meloxicam (MOBIC) 15  MG tablet Take 1 tablet by mouth daily.    Multiple Vitamin (MULTIVITAMIN WITH MINERALS) TABS tablet Take 1 tablet by mouth daily.    omeprazole (PRILOSEC) 20 MG capsule Take 20 mg by mouth every other day.        Allergies  Allergen Reactions  . Codeine Hives and Rash  .  Penicillins Rash    Oral rash/peeling Has patient had a PCN reaction causing immediate rash, facial/tongue/throat swelling, SOB or lightheadedness with hypotension: Yes Has patient had a PCN reaction causing severe rash involving mucus membranes or skin necrosis: No Has patient had a PCN reaction that required hospitalization No Has patient had a PCN reaction occurring within the last 10 years: No If all of the above answers are "NO", then may proceed with Cephalosporin use.   . Sulfa Antibiotics Hives and Rash      The results of significant diagnostics from this hospitalization (including imaging, microbiology, ancillary and laboratory) are listed below for reference.    Significant Diagnostic Studies: No results found.  Microbiology: No results found for this or any previous visit (from the past 240 hour(s)).   Labs: Basic Metabolic Panel:  Recent Labs Lab 02/12/16 1310 02/13/16 0549  NA 127* 135  K 2.8* 4.7  CL 88* 106  CO2 27 23  GLUCOSE 130* 97  BUN 9 6  CREATININE 0.75 0.61  CALCIUM 9.0 8.4*  MG 1.7  --    Liver Function Tests:  Recent Labs Lab 02/12/16 1310 02/13/16 0549  AST 211* 126*  ALT 265* 179*  ALKPHOS 213* 159*  BILITOT 3.0* 2.3*  PROT 7.3 5.5*  ALBUMIN 3.5 2.6*     Recent Labs Lab 02/12/16 1310 02/13/16 0549  WBC 9.0 4.3  NEUTROABS 8.0*  --   HGB 11.8* 9.6*  HCT 33.1* 27.3*  MCV 92.5 95.1  PLT 186 188    Signed: Kathie Dike, MD  [facility] 02/13/2016, 8:33 AM    By signing my name below, I, Rennis Harding, attest that this documentation has been prepared under the direction and in the presence of Kathie Dike, MD. Electronically signed: Rennis Harding, Scribe. 02/13/2016 12:27pm   I, Dr. Kathie Dike, personally performed the services described in this documentaiton. All medical record entries made by the scribe were at my direction and in my presence. I have reviewed the chart and agree that the record  reflects my personal performance and is accurate and complete  Kathie Dike, MD, 02/13/2016 12:55 PM

## 2016-02-14 LAB — HEPATITIS PANEL, ACUTE
HEP A IGM: NEGATIVE
HEP B C IGM: NEGATIVE
Hepatitis B Surface Ag: NEGATIVE

## 2016-02-17 DIAGNOSIS — Z1389 Encounter for screening for other disorder: Secondary | ICD-10-CM | POA: Diagnosis not present

## 2016-02-17 DIAGNOSIS — Z6821 Body mass index (BMI) 21.0-21.9, adult: Secondary | ICD-10-CM | POA: Diagnosis not present

## 2016-02-17 DIAGNOSIS — N390 Urinary tract infection, site not specified: Secondary | ICD-10-CM | POA: Diagnosis not present

## 2016-02-22 DIAGNOSIS — M5136 Other intervertebral disc degeneration, lumbar region: Secondary | ICD-10-CM | POA: Diagnosis not present

## 2016-02-22 DIAGNOSIS — M9903 Segmental and somatic dysfunction of lumbar region: Secondary | ICD-10-CM | POA: Diagnosis not present

## 2016-02-23 DIAGNOSIS — M9903 Segmental and somatic dysfunction of lumbar region: Secondary | ICD-10-CM | POA: Diagnosis not present

## 2016-02-23 DIAGNOSIS — M5136 Other intervertebral disc degeneration, lumbar region: Secondary | ICD-10-CM | POA: Diagnosis not present

## 2016-02-27 DIAGNOSIS — M5136 Other intervertebral disc degeneration, lumbar region: Secondary | ICD-10-CM | POA: Diagnosis not present

## 2016-02-27 DIAGNOSIS — M9903 Segmental and somatic dysfunction of lumbar region: Secondary | ICD-10-CM | POA: Diagnosis not present

## 2016-02-29 DIAGNOSIS — M5136 Other intervertebral disc degeneration, lumbar region: Secondary | ICD-10-CM | POA: Diagnosis not present

## 2016-02-29 DIAGNOSIS — M9903 Segmental and somatic dysfunction of lumbar region: Secondary | ICD-10-CM | POA: Diagnosis not present

## 2016-03-01 DIAGNOSIS — M9903 Segmental and somatic dysfunction of lumbar region: Secondary | ICD-10-CM | POA: Diagnosis not present

## 2016-03-01 DIAGNOSIS — M5136 Other intervertebral disc degeneration, lumbar region: Secondary | ICD-10-CM | POA: Diagnosis not present

## 2016-03-07 DIAGNOSIS — M9903 Segmental and somatic dysfunction of lumbar region: Secondary | ICD-10-CM | POA: Diagnosis not present

## 2016-03-07 DIAGNOSIS — M5136 Other intervertebral disc degeneration, lumbar region: Secondary | ICD-10-CM | POA: Diagnosis not present

## 2016-03-12 DIAGNOSIS — M9903 Segmental and somatic dysfunction of lumbar region: Secondary | ICD-10-CM | POA: Diagnosis not present

## 2016-03-12 DIAGNOSIS — M5136 Other intervertebral disc degeneration, lumbar region: Secondary | ICD-10-CM | POA: Diagnosis not present

## 2016-03-13 DIAGNOSIS — M5136 Other intervertebral disc degeneration, lumbar region: Secondary | ICD-10-CM | POA: Diagnosis not present

## 2016-03-13 DIAGNOSIS — M9903 Segmental and somatic dysfunction of lumbar region: Secondary | ICD-10-CM | POA: Diagnosis not present

## 2016-03-14 DIAGNOSIS — M5136 Other intervertebral disc degeneration, lumbar region: Secondary | ICD-10-CM | POA: Diagnosis not present

## 2016-03-14 DIAGNOSIS — M9903 Segmental and somatic dysfunction of lumbar region: Secondary | ICD-10-CM | POA: Diagnosis not present

## 2016-03-19 DIAGNOSIS — N390 Urinary tract infection, site not specified: Secondary | ICD-10-CM | POA: Diagnosis not present

## 2016-03-19 DIAGNOSIS — Z1389 Encounter for screening for other disorder: Secondary | ICD-10-CM | POA: Diagnosis not present

## 2016-03-19 DIAGNOSIS — M9903 Segmental and somatic dysfunction of lumbar region: Secondary | ICD-10-CM | POA: Diagnosis not present

## 2016-03-19 DIAGNOSIS — Z6821 Body mass index (BMI) 21.0-21.9, adult: Secondary | ICD-10-CM | POA: Diagnosis not present

## 2016-03-19 DIAGNOSIS — M5136 Other intervertebral disc degeneration, lumbar region: Secondary | ICD-10-CM | POA: Diagnosis not present

## 2016-03-21 DIAGNOSIS — M9903 Segmental and somatic dysfunction of lumbar region: Secondary | ICD-10-CM | POA: Diagnosis not present

## 2016-03-21 DIAGNOSIS — M5136 Other intervertebral disc degeneration, lumbar region: Secondary | ICD-10-CM | POA: Diagnosis not present

## 2016-03-22 DIAGNOSIS — M9903 Segmental and somatic dysfunction of lumbar region: Secondary | ICD-10-CM | POA: Diagnosis not present

## 2016-03-22 DIAGNOSIS — M5136 Other intervertebral disc degeneration, lumbar region: Secondary | ICD-10-CM | POA: Diagnosis not present

## 2016-04-19 DIAGNOSIS — R350 Frequency of micturition: Secondary | ICD-10-CM | POA: Diagnosis not present

## 2016-04-19 DIAGNOSIS — Z1389 Encounter for screening for other disorder: Secondary | ICD-10-CM | POA: Diagnosis not present

## 2016-04-19 DIAGNOSIS — Z6820 Body mass index (BMI) 20.0-20.9, adult: Secondary | ICD-10-CM | POA: Diagnosis not present

## 2016-04-19 DIAGNOSIS — N342 Other urethritis: Secondary | ICD-10-CM | POA: Diagnosis not present

## 2016-04-24 DIAGNOSIS — M170 Bilateral primary osteoarthritis of knee: Secondary | ICD-10-CM | POA: Diagnosis not present

## 2016-05-18 DIAGNOSIS — L723 Sebaceous cyst: Secondary | ICD-10-CM | POA: Diagnosis not present

## 2016-05-18 DIAGNOSIS — K219 Gastro-esophageal reflux disease without esophagitis: Secondary | ICD-10-CM | POA: Diagnosis not present

## 2016-05-18 DIAGNOSIS — I10 Essential (primary) hypertension: Secondary | ICD-10-CM | POA: Diagnosis not present

## 2016-05-18 DIAGNOSIS — L700 Acne vulgaris: Secondary | ICD-10-CM | POA: Diagnosis not present

## 2016-05-18 DIAGNOSIS — Z6820 Body mass index (BMI) 20.0-20.9, adult: Secondary | ICD-10-CM | POA: Diagnosis not present

## 2016-06-05 DIAGNOSIS — M170 Bilateral primary osteoarthritis of knee: Secondary | ICD-10-CM | POA: Diagnosis not present

## 2016-06-13 DIAGNOSIS — M170 Bilateral primary osteoarthritis of knee: Secondary | ICD-10-CM | POA: Diagnosis not present

## 2016-06-22 DIAGNOSIS — M170 Bilateral primary osteoarthritis of knee: Secondary | ICD-10-CM | POA: Diagnosis not present

## 2016-07-02 DIAGNOSIS — I10 Essential (primary) hypertension: Secondary | ICD-10-CM | POA: Diagnosis not present

## 2016-07-02 DIAGNOSIS — E782 Mixed hyperlipidemia: Secondary | ICD-10-CM | POA: Diagnosis not present

## 2016-07-02 DIAGNOSIS — E669 Obesity, unspecified: Secondary | ICD-10-CM | POA: Diagnosis not present

## 2016-07-02 DIAGNOSIS — N342 Other urethritis: Secondary | ICD-10-CM | POA: Diagnosis not present

## 2016-07-02 DIAGNOSIS — Z6821 Body mass index (BMI) 21.0-21.9, adult: Secondary | ICD-10-CM | POA: Diagnosis not present

## 2016-07-03 DIAGNOSIS — Z08 Encounter for follow-up examination after completed treatment for malignant neoplasm: Secondary | ICD-10-CM | POA: Diagnosis not present

## 2016-07-03 DIAGNOSIS — X32XXXA Exposure to sunlight, initial encounter: Secondary | ICD-10-CM | POA: Diagnosis not present

## 2016-07-03 DIAGNOSIS — L570 Actinic keratosis: Secondary | ICD-10-CM | POA: Diagnosis not present

## 2016-07-03 DIAGNOSIS — Z85828 Personal history of other malignant neoplasm of skin: Secondary | ICD-10-CM | POA: Diagnosis not present

## 2016-07-03 DIAGNOSIS — B078 Other viral warts: Secondary | ICD-10-CM | POA: Diagnosis not present

## 2016-07-25 DIAGNOSIS — Z23 Encounter for immunization: Secondary | ICD-10-CM | POA: Diagnosis not present

## 2016-07-31 DIAGNOSIS — M1711 Unilateral primary osteoarthritis, right knee: Secondary | ICD-10-CM | POA: Diagnosis not present

## 2016-07-31 DIAGNOSIS — M170 Bilateral primary osteoarthritis of knee: Secondary | ICD-10-CM | POA: Diagnosis not present

## 2016-07-31 DIAGNOSIS — M1712 Unilateral primary osteoarthritis, left knee: Secondary | ICD-10-CM | POA: Diagnosis not present

## 2016-07-31 DIAGNOSIS — M25561 Pain in right knee: Secondary | ICD-10-CM | POA: Diagnosis not present

## 2016-08-09 DIAGNOSIS — I10 Essential (primary) hypertension: Secondary | ICD-10-CM | POA: Diagnosis not present

## 2016-08-09 DIAGNOSIS — Z6821 Body mass index (BMI) 21.0-21.9, adult: Secondary | ICD-10-CM | POA: Diagnosis not present

## 2016-08-09 DIAGNOSIS — K219 Gastro-esophageal reflux disease without esophagitis: Secondary | ICD-10-CM | POA: Diagnosis not present

## 2016-08-09 DIAGNOSIS — N3000 Acute cystitis without hematuria: Secondary | ICD-10-CM | POA: Diagnosis not present

## 2016-09-06 DIAGNOSIS — K219 Gastro-esophageal reflux disease without esophagitis: Secondary | ICD-10-CM | POA: Diagnosis not present

## 2016-09-06 DIAGNOSIS — I10 Essential (primary) hypertension: Secondary | ICD-10-CM | POA: Diagnosis not present

## 2016-09-06 DIAGNOSIS — J343 Hypertrophy of nasal turbinates: Secondary | ICD-10-CM | POA: Diagnosis not present

## 2016-09-06 DIAGNOSIS — E782 Mixed hyperlipidemia: Secondary | ICD-10-CM | POA: Diagnosis not present

## 2016-09-06 DIAGNOSIS — J069 Acute upper respiratory infection, unspecified: Secondary | ICD-10-CM | POA: Diagnosis not present

## 2016-09-06 DIAGNOSIS — Z6821 Body mass index (BMI) 21.0-21.9, adult: Secondary | ICD-10-CM | POA: Diagnosis not present

## 2016-10-11 DIAGNOSIS — H43393 Other vitreous opacities, bilateral: Secondary | ICD-10-CM | POA: Diagnosis not present

## 2016-10-30 DIAGNOSIS — Z23 Encounter for immunization: Secondary | ICD-10-CM | POA: Diagnosis not present

## 2016-11-01 DIAGNOSIS — M170 Bilateral primary osteoarthritis of knee: Secondary | ICD-10-CM | POA: Diagnosis not present

## 2016-11-08 DIAGNOSIS — H25013 Cortical age-related cataract, bilateral: Secondary | ICD-10-CM | POA: Diagnosis not present

## 2016-11-08 DIAGNOSIS — H2513 Age-related nuclear cataract, bilateral: Secondary | ICD-10-CM | POA: Diagnosis not present

## 2016-11-08 DIAGNOSIS — H2512 Age-related nuclear cataract, left eye: Secondary | ICD-10-CM | POA: Diagnosis not present

## 2016-11-08 DIAGNOSIS — H35033 Hypertensive retinopathy, bilateral: Secondary | ICD-10-CM | POA: Diagnosis not present

## 2016-11-08 DIAGNOSIS — H40033 Anatomical narrow angle, bilateral: Secondary | ICD-10-CM | POA: Diagnosis not present

## 2016-11-20 DIAGNOSIS — H2512 Age-related nuclear cataract, left eye: Secondary | ICD-10-CM | POA: Diagnosis not present

## 2016-11-20 DIAGNOSIS — H25012 Cortical age-related cataract, left eye: Secondary | ICD-10-CM | POA: Diagnosis not present

## 2016-11-20 DIAGNOSIS — H25812 Combined forms of age-related cataract, left eye: Secondary | ICD-10-CM | POA: Diagnosis not present

## 2016-11-24 ENCOUNTER — Encounter (HOSPITAL_COMMUNITY): Payer: Self-pay | Admitting: Emergency Medicine

## 2016-11-24 ENCOUNTER — Emergency Department (HOSPITAL_COMMUNITY)
Admission: EM | Admit: 2016-11-24 | Discharge: 2016-11-24 | Disposition: A | Discharge status: Home / Self Care | Payer: Medicare Other | Attending: Emergency Medicine | Admitting: Emergency Medicine

## 2016-11-24 ENCOUNTER — Emergency Department (HOSPITAL_COMMUNITY): Payer: Medicare Other

## 2016-11-24 DIAGNOSIS — I10 Essential (primary) hypertension: Secondary | ICD-10-CM | POA: Diagnosis not present

## 2016-11-24 DIAGNOSIS — Z79899 Other long term (current) drug therapy: Secondary | ICD-10-CM | POA: Insufficient documentation

## 2016-11-24 DIAGNOSIS — Y999 Unspecified external cause status: Secondary | ICD-10-CM | POA: Insufficient documentation

## 2016-11-24 DIAGNOSIS — Y929 Unspecified place or not applicable: Secondary | ICD-10-CM | POA: Diagnosis not present

## 2016-11-24 DIAGNOSIS — W2209XA Striking against other stationary object, initial encounter: Secondary | ICD-10-CM | POA: Diagnosis not present

## 2016-11-24 DIAGNOSIS — Y939 Activity, unspecified: Secondary | ICD-10-CM | POA: Diagnosis not present

## 2016-11-24 DIAGNOSIS — S5011XA Contusion of right forearm, initial encounter: Secondary | ICD-10-CM | POA: Insufficient documentation

## 2016-11-24 DIAGNOSIS — S6991XA Unspecified injury of right wrist, hand and finger(s), initial encounter: Secondary | ICD-10-CM | POA: Diagnosis present

## 2016-11-24 NOTE — Discharge Instructions (Signed)
Elevate and apply ice packs on/off to your wrist and forearm.  Tylenol every 4 hrs if needed for pain.

## 2016-11-24 NOTE — ED Triage Notes (Addendum)
Patient c/o hematoma with bruising to right wrist. Per patient woke this morning and hit wrist on door frame. Per patient sore to touch. Full ROM.

## 2016-11-25 NOTE — ED Provider Notes (Signed)
[facility] DEPT Provider Note   CSN: [id number] Arrival date & time: 11/24/16  1052     History   Chief Complaint Chief Complaint  Patient presents with  . Wrist Injury    HPI Claire West is a 76 y.o. female.  HPI  Claire West is a 76 y.o. female who takes ASA daily, presents to the Emergency Department complaining of pain, swelling, bruising to the right wrist and forearm.  She states that she accidentally struck her arm on a door frame several hours before ER arrival.  She noticed a "knot" on her forearm and was concerned about a fracture.  She describes a "soreness" to her wrist and forearm with movement and gripping. She denies numbness, pain to the elbow or fingers and open wounds.   Past Medical History:  Diagnosis Date  . Depression   . GERD (gastroesophageal reflux disease)   . Hypertension   . Seasonal allergies     Patient Active Problem List   Diagnosis Date Noted  . Hyponatremia 02/12/2016  . Hypokalemia 02/12/2016  . Dehydration 02/12/2016  . Elevated LFTs 02/12/2016  . HTN (hypertension) 02/12/2016  . GERD (gastroesophageal reflux disease) 02/12/2016    Past Surgical History:  Procedure Laterality Date  . ABDOMINAL HYSTERECTOMY    . ARTHROSCOPIC REPAIR ACL    . COLONOSCOPY    . COLONOSCOPY N/A 11/23/2015   Procedure: COLONOSCOPY;  Surgeon: Rogene Houston, MD;  Location: [facility];  Service: Endoscopy;  Laterality: N/A;  44    OB History    Gravida Para Term Preterm AB Living   1 1 1     1    SAB TAB Ectopic Multiple Live Births                   Home Medications    Prior to Admission medications   Medication Sig Start Date End Date Taking? Authorizing Provider  atenolol (TENORMIN) 25 MG tablet Take 25 mg by mouth daily. 02/18/15   Historical Provider, MD  Calcium Carbonate-Vitamin D (CALCIUM-D PO) Take 1 tablet by mouth daily.    Historical Provider, MD  escitalopram (LEXAPRO) 5 MG tablet Take 5 mg by mouth at bedtime.   04/23/15   Historical Provider, MD  fluticasone (FLONASE) 50 MCG/ACT nasal spray Place 2 sprays into both nostrils daily as needed for allergies.  04/16/15   Historical Provider, MD  losartan-hydrochlorothiazide (HYZAAR) 100-12.5 MG per tablet Take 1 tablet by mouth daily. 04/16/15   Historical Provider, MD  meloxicam (MOBIC) 15 MG tablet Take 1 tablet by mouth daily. 01/18/16   Historical Provider, MD  Multiple Vitamin (MULTIVITAMIN WITH MINERALS) TABS tablet Take 1 tablet by mouth daily.    Historical Provider, MD  omeprazole (PRILOSEC) 20 MG capsule Take 20 mg by mouth every other day.  04/18/15   Historical Provider, MD    Family History Family History  Problem Relation Age of Onset  . Lung cancer Mother   . Lung cancer Sister     Social History Social History  Substance Use Topics  . Smoking status: Never Smoker  . Smokeless tobacco: Never Used  . Alcohol use No     Allergies   Codeine; Penicillins; and Sulfa antibiotics   Review of Systems Review of Systems  Constitutional: Negative for chills and fever.  Cardiovascular: Negative for chest pain.  Musculoskeletal: Positive for arthralgias (right wrist and forearm pain and bruising ) and joint swelling.  Skin: Negative for color change and wound.  All other systems reviewed and are negative.    Physical Exam Updated Vital Signs BP 177/69   Pulse 80   Temp 98.4 F (36.9 C) (Oral)   Resp 16   Ht 5\' 6"  (1.676 m)   Wt 59 kg   SpO2 98%   BMI 20.98 kg/m   Physical Exam  Constitutional: She is oriented to person, place, and time. She appears well-developed and well-nourished. No distress.  HENT:  Head: Normocephalic and atraumatic.  Cardiovascular: Normal rate, regular rhythm and intact distal pulses.   Pulmonary/Chest: Effort normal and breath sounds normal.  Musculoskeletal: She exhibits tenderness. She exhibits no edema.  Ecchymosis and tenderness to palp of the distal right forearm and wrist.  Quarter size hematoma  of the radial aspect of the distal forearm.  Radial pulse is brisk, distal sensation intact.  CR< 2 sec.  No bruising or bony deformity.  Patient has full ROM. Compartments soft.  Neurological: She is alert and oriented to person, place, and time. She exhibits normal muscle tone. Coordination normal.  Skin: Skin is warm and dry.  Nursing note and vitals reviewed.    ED Treatments / Results  Labs (all labs ordered are listed, but only abnormal results are displayed) Labs Reviewed - No data to display  EKG  EKG Interpretation None       Radiology Dg Forearm Right  Result Date: 11/24/2016 CLINICAL DATA:  [place] from bed last night, awoke with bruising and swelling at RIGHT distal radius and ulna EXAM: RIGHT FOREARM - 2 VIEW COMPARISON:  None FINDINGS: Osseous demineralization. Joint spaces preserved. No acute fracture, dislocation, or bone destruction. Degenerative changes at STT joint. No elbow joint effusion. Soft tissue swelling at the distal forearm. IMPRESSION: No acute osseous abnormalities. Electronically Signed   By: Lavonia Dana M.D.   On: 11/24/2016 12:09    Procedures Procedures (including critical care time)  Medications Ordered in ED Medications - No data to display   Initial Impression / Assessment and Plan / ED Course  I have reviewed the triage vital signs and the nursing notes.  Pertinent labs & imaging results that were available during my care of the patient were reviewed by me and considered in my medical decision making (see chart for details).     XR negative, NV intact.  Hematoma of the distal forearm.  Compartments soft.  Forearm splint applied.  Pt agrees to RICE therapy and close orthopedic f/u.  Tylenol if needed for pain, Appears stable for d/c  Final Clinical Impressions(s) / ED Diagnoses   Final diagnoses:  Contusion of right forearm, initial encounter    New Prescriptions Discharge Medication List as of 11/24/2016 12:25 PM       Janaki Exley  Vanessa [place], PA-C 11/25/16 [place], MD 11/27/16 [phone]

## 2016-12-04 DIAGNOSIS — I10 Essential (primary) hypertension: Secondary | ICD-10-CM | POA: Diagnosis not present

## 2016-12-04 DIAGNOSIS — Z6820 Body mass index (BMI) 20.0-20.9, adult: Secondary | ICD-10-CM | POA: Diagnosis not present

## 2016-12-04 DIAGNOSIS — J029 Acute pharyngitis, unspecified: Secondary | ICD-10-CM | POA: Diagnosis not present

## 2016-12-04 DIAGNOSIS — K219 Gastro-esophageal reflux disease without esophagitis: Secondary | ICD-10-CM | POA: Diagnosis not present

## 2016-12-05 DIAGNOSIS — H25011 Cortical age-related cataract, right eye: Secondary | ICD-10-CM | POA: Diagnosis not present

## 2016-12-05 DIAGNOSIS — H2511 Age-related nuclear cataract, right eye: Secondary | ICD-10-CM | POA: Diagnosis not present

## 2016-12-11 DIAGNOSIS — H2511 Age-related nuclear cataract, right eye: Secondary | ICD-10-CM | POA: Diagnosis not present

## 2016-12-11 DIAGNOSIS — H25811 Combined forms of age-related cataract, right eye: Secondary | ICD-10-CM | POA: Diagnosis not present

## 2017-01-11 DIAGNOSIS — M7062 Trochanteric bursitis, left hip: Secondary | ICD-10-CM | POA: Diagnosis not present

## 2017-01-11 DIAGNOSIS — M1711 Unilateral primary osteoarthritis, right knee: Secondary | ICD-10-CM | POA: Diagnosis not present

## 2017-01-11 DIAGNOSIS — M1712 Unilateral primary osteoarthritis, left knee: Secondary | ICD-10-CM | POA: Diagnosis not present

## 2017-01-24 DIAGNOSIS — M7062 Trochanteric bursitis, left hip: Secondary | ICD-10-CM | POA: Diagnosis not present

## 2017-01-24 DIAGNOSIS — M1711 Unilateral primary osteoarthritis, right knee: Secondary | ICD-10-CM | POA: Diagnosis not present

## 2017-02-02 DIAGNOSIS — M7062 Trochanteric bursitis, left hip: Secondary | ICD-10-CM | POA: Diagnosis not present

## 2017-02-28 DIAGNOSIS — M7062 Trochanteric bursitis, left hip: Secondary | ICD-10-CM | POA: Diagnosis not present

## 2017-04-24 ENCOUNTER — Ambulatory Visit: Admit: 2017-04-24 | Payer: Medicare Other | Admitting: Orthopedic Surgery

## 2017-04-24 SURGERY — REPAIR, TENDON, GLUTEUS MEDIUS, OPEN
Anesthesia: Choice | Laterality: Left

## 2017-04-26 DIAGNOSIS — Z6820 Body mass index (BMI) 20.0-20.9, adult: Secondary | ICD-10-CM | POA: Diagnosis not present

## 2017-04-26 DIAGNOSIS — Z1389 Encounter for screening for other disorder: Secondary | ICD-10-CM | POA: Diagnosis not present

## 2017-04-26 DIAGNOSIS — K219 Gastro-esophageal reflux disease without esophagitis: Secondary | ICD-10-CM | POA: Diagnosis not present

## 2017-04-26 DIAGNOSIS — S39012A Strain of muscle, fascia and tendon of lower back, initial encounter: Secondary | ICD-10-CM | POA: Diagnosis not present

## 2017-04-26 DIAGNOSIS — I973 Postprocedural hypertension: Secondary | ICD-10-CM | POA: Diagnosis not present

## 2017-04-26 DIAGNOSIS — E782 Mixed hyperlipidemia: Secondary | ICD-10-CM | POA: Diagnosis not present

## 2017-04-26 DIAGNOSIS — E669 Obesity, unspecified: Secondary | ICD-10-CM | POA: Diagnosis not present

## 2017-04-26 DIAGNOSIS — I10 Essential (primary) hypertension: Secondary | ICD-10-CM | POA: Diagnosis not present

## 2017-04-28 ENCOUNTER — Emergency Department (HOSPITAL_COMMUNITY)
Admission: EM | Admit: 2017-04-28 | Discharge: 2017-04-28 | Disposition: A | Discharge status: Home / Self Care | Payer: Medicare Other | Attending: Emergency Medicine | Admitting: Emergency Medicine

## 2017-04-28 ENCOUNTER — Emergency Department (HOSPITAL_COMMUNITY): Payer: Medicare Other

## 2017-04-28 ENCOUNTER — Encounter (HOSPITAL_COMMUNITY): Payer: Self-pay | Admitting: Emergency Medicine

## 2017-04-28 DIAGNOSIS — M546 Pain in thoracic spine: Secondary | ICD-10-CM | POA: Insufficient documentation

## 2017-04-28 DIAGNOSIS — R079 Chest pain, unspecified: Secondary | ICD-10-CM | POA: Diagnosis not present

## 2017-04-28 DIAGNOSIS — I10 Essential (primary) hypertension: Secondary | ICD-10-CM | POA: Diagnosis not present

## 2017-04-28 DIAGNOSIS — Z79899 Other long term (current) drug therapy: Secondary | ICD-10-CM | POA: Diagnosis not present

## 2017-04-28 DIAGNOSIS — M25512 Pain in left shoulder: Secondary | ICD-10-CM | POA: Diagnosis present

## 2017-04-28 MED ORDER — TRAMADOL HCL 50 MG PO TABS: 50.0000 mg | ORAL_TABLET | Freq: Four times a day (QID) | ORAL | 0 refills | Status: DC | PRN

## 2017-04-28 MED ORDER — HYDROMORPHONE HCL 1 MG/ML IJ SOLN
0.5000 mg | Freq: Once | INTRAMUSCULAR | Status: AC
  Administered 2017-04-28: 0.5 mg via INTRAMUSCULAR
  Filled 2017-04-28: qty 1

## 2017-04-28 MED ORDER — ACETAMINOPHEN 500 MG PO TABS
1000.0000 mg | ORAL_TABLET | Freq: Once | ORAL | Status: AC
  Administered 2017-04-28: 1000 mg via ORAL
  Filled 2017-04-28: qty 2

## 2017-04-28 MED ORDER — IBUPROFEN 400 MG PO TABS
400.0000 mg | ORAL_TABLET | Freq: Once | ORAL | Status: AC
  Administered 2017-04-28: 400 mg via ORAL
  Filled 2017-04-28: qty 1

## 2017-04-28 MED ORDER — TRAMADOL HCL 50 MG PO TABS
50.0000 mg | ORAL_TABLET | Freq: Once | ORAL | Status: DC
  Filled 2017-04-28: qty 1

## 2017-04-28 NOTE — ED Triage Notes (Signed)
Patient c/o left shoulder pain. Per patient started Wednesday and progressively getting worse. Patient states recently moved husband into [place] and lifted multiple things. Patient reports seeing PA at PCP and was given Robaxin for pulled muscle. Per patient no relief with medication.

## 2017-04-28 NOTE — ED Provider Notes (Addendum)
[facility] DEPT Provider Note   CSN: [id number] Arrival date & time: 04/28/17  1527     History   Chief Complaint Chief Complaint  Patient presents with  . Shoulder Pain    HPI Claire West is a 76 y.o. female.  Patient presents with left pain beneath shoulder blade since Wednesday. Progressively getting worse. Patient was seen by physician assistant and given muscle relaxant with minimal relief. Patient recently was helping move her husband in a [place] lifted multiple things. Pain is worse with specific positions and range of motion. Patient denies chest pain shortness of breath arm pain diaphoresis vomiting abdominal pain or other concerns. No history of heart problems or aortic issues. Patient has been taking her blood pressure meds regularly last took this morning.      Past Medical History:  Diagnosis Date  . Depression   . GERD (gastroesophageal reflux disease)   . Hypertension   . Seasonal allergies     Patient Active Problem List   Diagnosis Date Noted  . Hyponatremia 02/12/2016  . Hypokalemia 02/12/2016  . Dehydration 02/12/2016  . Elevated LFTs 02/12/2016  . HTN (hypertension) 02/12/2016  . GERD (gastroesophageal reflux disease) 02/12/2016    Past Surgical History:  Procedure Laterality Date  . ABDOMINAL HYSTERECTOMY    . ARTHROSCOPIC REPAIR ACL    . COLONOSCOPY    . COLONOSCOPY N/A 11/23/2015   Procedure: COLONOSCOPY;  Surgeon: Rogene Houston, MD;  Location: [facility];  Service: Endoscopy;  Laterality: N/A;  47    OB History    Gravida Para Term Preterm AB Living   1 1 1     1    SAB TAB Ectopic Multiple Live Births                   Home Medications    Prior to Admission medications   Medication Sig Start Date End Date Taking? Authorizing Provider  atenolol (TENORMIN) 25 MG tablet Take 25 mg by mouth daily. 02/18/15   [provider]  Calcium Carbonate-Vitamin D (CALCIUM-D PO) Take 1 tablet by mouth daily.     [provider]  escitalopram (LEXAPRO) 5 MG tablet Take 5 mg by mouth at bedtime.  04/23/15   [provider]  fluticasone (FLONASE) 50 MCG/ACT nasal spray Place 2 sprays into both nostrils daily as needed for allergies.  04/16/15   [provider]  losartan-hydrochlorothiazide (HYZAAR) 100-12.5 MG per tablet Take 1 tablet by mouth daily. 04/16/15   [provider]  meloxicam (MOBIC) 15 MG tablet Take 1 tablet by mouth daily. 01/18/16   [provider]  Multiple Vitamin (MULTIVITAMIN WITH MINERALS) TABS tablet Take 1 tablet by mouth daily.    [provider]  omeprazole (PRILOSEC) 20 MG capsule Take 20 mg by mouth every other day.  04/18/15   [provider]  traMADol (ULTRAM) 50 MG tablet Take 1 tablet (50 mg total) by mouth every 6 (six) hours as needed. 04/28/17   Elnora Morrison, MD    Family History Family History  Problem Relation Age of Onset  . Lung cancer Mother   . Lung cancer Sister     Social History Social History  Substance Use Topics  . Smoking status: Never Smoker  . Smokeless tobacco: Never Used  . Alcohol use No     Allergies   Codeine; Penicillins; and Sulfa antibiotics   Review of Systems Review of Systems  Constitutional: Negative for chills and fever.  HENT: Negative  for congestion.   Eyes: Negative for visual disturbance.  Respiratory: Negative for shortness of breath.   Cardiovascular: Negative for chest pain.  Gastrointestinal: Negative for abdominal pain and vomiting.  Genitourinary: Negative for dysuria and flank pain.  Musculoskeletal: Negative for back pain, neck pain and neck stiffness.  Skin: Negative for rash.  Neurological: Negative for light-headedness and headaches.     Physical Exam Updated Vital Signs BP (!) 203/81   Pulse 70   Temp 98 F (36.7 C) (Oral)   Resp 17   Ht 5\' 6"  (1.676 m)   Wt 56.7 kg (125 lb)   SpO2 100%   BMI 20.18 kg/m   Physical Exam  Constitutional:  She is oriented to person, place, and time. She appears well-developed and well-nourished.  HENT:  Head: Normocephalic and atraumatic.  Eyes: Conjunctivae are normal. Right eye exhibits no discharge. Left eye exhibits no discharge.  Neck: Normal range of motion. Neck supple. No tracheal deviation present.  Cardiovascular: Normal rate, regular rhythm and intact distal pulses.   No murmur heard. Pulmonary/Chest: Effort normal and breath sounds normal.  Abdominal: Soft. She exhibits no distension. There is no tenderness. There is no guarding.  Musculoskeletal: She exhibits tenderness. She exhibits no edema.  Patient has tenderness beneath the left shoulder blade with extension and palpation. No external sign of infection. No tenderness to left shoulder.   Neurological: She is alert and oriented to person, place, and time.  Skin: Skin is warm. No rash noted.  Psychiatric: She has a normal mood and affect.  Nursing note and vitals reviewed.    ED Treatments / Results  Labs (all labs ordered are listed, but only abnormal results are displayed) Labs Reviewed - No data to display  EKG  EKG Interpretation  Date/Time:  Sunday April 28 2017 16:12:03 EDT Ventricular Rate:  75 PR Interval:    QRS Duration: 103 QT Interval:  392 QTC Calculation: 438 R Axis:   -8 Text Interpretation:  Sinus rhythm Baseline wander in lead(s) I III aVL Confirmed by Elnora Morrison [phone]) on 04/28/2017 4:34:30 PM       Radiology Dg Chest 2 View  Result Date: 04/28/2017 CLINICAL DATA:  Left shoulder blade pain. Pain began 4 days ago and has progressively gotten worse. EXAM: CHEST  2 VIEW COMPARISON:  None. FINDINGS: Encapsulated partially calcified breast implants identified. No pneumothorax. No pulmonary nodules or masses. The heart size is borderline. The hila and mediastinum are unremarkable. No acute infiltrate is identified. Mild degenerative changes seen in the thoracic spine. IMPRESSION: No active  cardiopulmonary disease. Electronically Signed   By: Dorise Bullion III M.D   On: 04/28/2017 16:31    Procedures Procedures (including critical care time)  Medications Ordered in ED Medications  ibuprofen (ADVIL,MOTRIN) tablet 400 mg (not administered)  HYDROmorphone (DILAUDID) injection 0.5 mg (not administered)  acetaminophen (TYLENOL) tablet 1,000 mg (1,000 mg Oral Given 04/28/17 1612)     Initial Impression / Assessment and Plan / ED Course  I have reviewed the triage vital signs and the nursing notes.  Pertinent labs & imaging results that were available during my care of the patient were reviewed by me and considered in my medical decision making (see chart for details).    Patient presents with clinically musculoskeletal tenderness worse with range of motion. With persistent worsening pain plan for screening chest x-ray, pain meds and screening EKG. No other concerning features on exam. EKG and chest x-ray reassuring. Discussed rechecking 2 days we'll hold  off on further advanced imaging at this time.  Results and differential diagnosis were discussed with the patient/parent/guardian. Xrays were independently reviewed by myself.  Close follow up outpatient was discussed, comfortable with the plan.   Medications  ibuprofen (ADVIL,MOTRIN) tablet 400 mg (not administered)  HYDROmorphone (DILAUDID) injection 0.5 mg (not administered)  acetaminophen (TYLENOL) tablet 1,000 mg (1,000 mg Oral Given 04/28/17 1612)    Vitals:   04/28/17 1531 04/28/17 1532 04/28/17 1640  BP:  (!) 207/89 (!) 203/81  Pulse:  83 70  Resp:  18 17  Temp:  98 F (36.7 C)   TempSrc:  Oral   SpO2:  98% 100%  Weight: 56.7 kg (125 lb)    Height: 5\' 6"  (1.676 m)      Final diagnoses:  Essential hypertension  Acute left-sided thoracic back pain  \   Final Clinical Impressions(s) / ED Diagnoses   Final diagnoses:  Essential hypertension  Acute left-sided thoracic back pain    New  Prescriptions New Prescriptions   TRAMADOL (ULTRAM) 50 MG TABLET    Take 1 tablet (50 mg total) by mouth every 6 (six) hours as needed.     Elnora Morrison, MD 04/28/17 2751    Elnora Morrison, MD 04/28/17 [phone]

## 2017-04-28 NOTE — ED Notes (Signed)
ED Provider at bedside. 

## 2017-04-28 NOTE — Discharge Instructions (Signed)
Take Tylenol as needed every 4 hours for pain. Take ibuprofen as needed every 6 hours for pain and inflammation. For breakthrough pain you can take tramadol with careful as he can make you sleepy, no driving while taking. Return to the emergency department if he develop chest pain, arm pain, sweating, vomiting, fevers or worsening symptoms. Follow closely with your doctor.  If you were given medicines take as directed.  If you are on coumadin or contraceptives realize their levels and effectiveness is altered by many different medicines.  If you have any reaction (rash, tongues swelling, other) to the medicines stop taking and see a physician.    If your blood pressure was elevated in the ER make sure you follow up for management with a primary doctor or return for chest pain, shortness of breath or stroke symptoms.  Please follow up as directed and return to the ER or see a physician for new or worsening symptoms.  Thank you. Vitals:   04/28/17 1531 04/28/17 1532  BP:  (!) 207/89  Pulse:  83  Resp:  18  Temp:  98 F (36.7 C)  TempSrc:  Oral  SpO2:  98%  Weight: 56.7 kg (125 lb)   Height: 5\' 6"  (1.676 m)

## 2017-05-01 DIAGNOSIS — M170 Bilateral primary osteoarthritis of knee: Secondary | ICD-10-CM | POA: Diagnosis not present

## 2017-05-11 DIAGNOSIS — Z79899 Other long term (current) drug therapy: Secondary | ICD-10-CM | POA: Diagnosis not present

## 2017-05-11 DIAGNOSIS — W01198A Fall on same level from slipping, tripping and stumbling with subsequent striking against other object, initial encounter: Secondary | ICD-10-CM | POA: Diagnosis not present

## 2017-05-11 DIAGNOSIS — S0180XA Unspecified open wound of other part of head, initial encounter: Secondary | ICD-10-CM | POA: Diagnosis not present

## 2017-05-11 DIAGNOSIS — S50312A Abrasion of left elbow, initial encounter: Secondary | ICD-10-CM | POA: Diagnosis not present

## 2017-05-11 DIAGNOSIS — Z7982 Long term (current) use of aspirin: Secondary | ICD-10-CM | POA: Diagnosis not present

## 2017-05-11 DIAGNOSIS — S0181XA Laceration without foreign body of other part of head, initial encounter: Secondary | ICD-10-CM | POA: Diagnosis not present

## 2017-05-11 DIAGNOSIS — S098XXA Other specified injuries of head, initial encounter: Secondary | ICD-10-CM | POA: Diagnosis not present

## 2017-05-11 DIAGNOSIS — S0990XA Unspecified injury of head, initial encounter: Secondary | ICD-10-CM | POA: Diagnosis not present

## 2017-05-11 DIAGNOSIS — I10 Essential (primary) hypertension: Secondary | ICD-10-CM | POA: Diagnosis not present

## 2017-05-20 DIAGNOSIS — Z6820 Body mass index (BMI) 20.0-20.9, adult: Secondary | ICD-10-CM | POA: Diagnosis not present

## 2017-05-20 DIAGNOSIS — Z1389 Encounter for screening for other disorder: Secondary | ICD-10-CM | POA: Diagnosis not present

## 2017-05-20 DIAGNOSIS — I10 Essential (primary) hypertension: Secondary | ICD-10-CM | POA: Diagnosis not present

## 2017-05-20 DIAGNOSIS — Z4802 Encounter for removal of sutures: Secondary | ICD-10-CM | POA: Diagnosis not present

## 2017-05-20 DIAGNOSIS — N393 Stress incontinence (female) (male): Secondary | ICD-10-CM | POA: Diagnosis not present

## 2017-05-22 DIAGNOSIS — F419 Anxiety disorder, unspecified: Secondary | ICD-10-CM | POA: Diagnosis not present

## 2017-05-22 DIAGNOSIS — L309 Dermatitis, unspecified: Secondary | ICD-10-CM | POA: Diagnosis not present

## 2017-05-22 DIAGNOSIS — I10 Essential (primary) hypertension: Secondary | ICD-10-CM | POA: Diagnosis not present

## 2017-05-22 DIAGNOSIS — Z6820 Body mass index (BMI) 20.0-20.9, adult: Secondary | ICD-10-CM | POA: Diagnosis not present

## 2017-05-22 DIAGNOSIS — E782 Mixed hyperlipidemia: Secondary | ICD-10-CM | POA: Diagnosis not present

## 2017-05-22 DIAGNOSIS — E538 Deficiency of other specified B group vitamins: Secondary | ICD-10-CM | POA: Diagnosis not present

## 2017-05-22 DIAGNOSIS — N959 Unspecified menopausal and perimenopausal disorder: Secondary | ICD-10-CM | POA: Diagnosis not present

## 2017-05-22 DIAGNOSIS — F329 Major depressive disorder, single episode, unspecified: Secondary | ICD-10-CM | POA: Diagnosis not present

## 2017-05-22 DIAGNOSIS — R5383 Other fatigue: Secondary | ICD-10-CM | POA: Diagnosis not present

## 2017-05-29 DIAGNOSIS — Z0000 Encounter for general adult medical examination without abnormal findings: Secondary | ICD-10-CM | POA: Diagnosis not present

## 2017-05-29 DIAGNOSIS — Z681 Body mass index (BMI) 19 or less, adult: Secondary | ICD-10-CM | POA: Diagnosis not present

## 2017-05-29 DIAGNOSIS — Z1389 Encounter for screening for other disorder: Secondary | ICD-10-CM | POA: Diagnosis not present

## 2017-06-18 DIAGNOSIS — D519 Vitamin B12 deficiency anemia, unspecified: Secondary | ICD-10-CM | POA: Diagnosis not present

## 2017-07-11 DIAGNOSIS — J209 Acute bronchitis, unspecified: Secondary | ICD-10-CM | POA: Diagnosis not present

## 2017-07-11 DIAGNOSIS — J069 Acute upper respiratory infection, unspecified: Secondary | ICD-10-CM | POA: Diagnosis not present

## 2017-07-11 DIAGNOSIS — Z23 Encounter for immunization: Secondary | ICD-10-CM | POA: Diagnosis not present

## 2017-07-11 DIAGNOSIS — E538 Deficiency of other specified B group vitamins: Secondary | ICD-10-CM | POA: Diagnosis not present

## 2017-07-11 DIAGNOSIS — Z6820 Body mass index (BMI) 20.0-20.9, adult: Secondary | ICD-10-CM | POA: Diagnosis not present

## 2017-07-19 DIAGNOSIS — M25551 Pain in right hip: Secondary | ICD-10-CM | POA: Diagnosis not present

## 2017-07-19 DIAGNOSIS — M25562 Pain in left knee: Secondary | ICD-10-CM | POA: Diagnosis not present

## 2017-07-19 DIAGNOSIS — M25561 Pain in right knee: Secondary | ICD-10-CM | POA: Diagnosis not present

## 2017-07-22 DIAGNOSIS — M25562 Pain in left knee: Secondary | ICD-10-CM | POA: Diagnosis not present

## 2017-07-22 DIAGNOSIS — M25561 Pain in right knee: Secondary | ICD-10-CM | POA: Diagnosis not present

## 2017-07-22 DIAGNOSIS — M25551 Pain in right hip: Secondary | ICD-10-CM | POA: Diagnosis not present

## 2017-07-26 DIAGNOSIS — M25562 Pain in left knee: Secondary | ICD-10-CM | POA: Diagnosis not present

## 2017-07-26 DIAGNOSIS — M25561 Pain in right knee: Secondary | ICD-10-CM | POA: Diagnosis not present

## 2017-07-26 DIAGNOSIS — M25551 Pain in right hip: Secondary | ICD-10-CM | POA: Diagnosis not present

## 2017-07-29 DIAGNOSIS — M25561 Pain in right knee: Secondary | ICD-10-CM | POA: Diagnosis not present

## 2017-07-29 DIAGNOSIS — M25562 Pain in left knee: Secondary | ICD-10-CM | POA: Diagnosis not present

## 2017-07-29 DIAGNOSIS — M25551 Pain in right hip: Secondary | ICD-10-CM | POA: Diagnosis not present

## 2017-07-31 DIAGNOSIS — M25562 Pain in left knee: Secondary | ICD-10-CM | POA: Diagnosis not present

## 2017-07-31 DIAGNOSIS — M25561 Pain in right knee: Secondary | ICD-10-CM | POA: Diagnosis not present

## 2017-07-31 DIAGNOSIS — M25551 Pain in right hip: Secondary | ICD-10-CM | POA: Diagnosis not present

## 2017-08-01 DIAGNOSIS — M1712 Unilateral primary osteoarthritis, left knee: Secondary | ICD-10-CM | POA: Diagnosis not present

## 2017-08-01 DIAGNOSIS — M1711 Unilateral primary osteoarthritis, right knee: Secondary | ICD-10-CM | POA: Diagnosis not present

## 2017-08-01 DIAGNOSIS — M170 Bilateral primary osteoarthritis of knee: Secondary | ICD-10-CM | POA: Diagnosis not present

## 2017-08-02 DIAGNOSIS — M25551 Pain in right hip: Secondary | ICD-10-CM | POA: Diagnosis not present

## 2017-08-02 DIAGNOSIS — M25561 Pain in right knee: Secondary | ICD-10-CM | POA: Diagnosis not present

## 2017-08-02 DIAGNOSIS — M25562 Pain in left knee: Secondary | ICD-10-CM | POA: Diagnosis not present

## 2017-08-05 DIAGNOSIS — E538 Deficiency of other specified B group vitamins: Secondary | ICD-10-CM | POA: Diagnosis not present

## 2017-08-06 DIAGNOSIS — M25551 Pain in right hip: Secondary | ICD-10-CM | POA: Diagnosis not present

## 2017-08-06 DIAGNOSIS — M25561 Pain in right knee: Secondary | ICD-10-CM | POA: Diagnosis not present

## 2017-08-06 DIAGNOSIS — M25562 Pain in left knee: Secondary | ICD-10-CM | POA: Diagnosis not present

## 2017-08-08 DIAGNOSIS — M25561 Pain in right knee: Secondary | ICD-10-CM | POA: Diagnosis not present

## 2017-08-08 DIAGNOSIS — M25562 Pain in left knee: Secondary | ICD-10-CM | POA: Diagnosis not present

## 2017-08-08 DIAGNOSIS — M25551 Pain in right hip: Secondary | ICD-10-CM | POA: Diagnosis not present

## 2017-08-12 DIAGNOSIS — M25561 Pain in right knee: Secondary | ICD-10-CM | POA: Diagnosis not present

## 2017-08-12 DIAGNOSIS — M25551 Pain in right hip: Secondary | ICD-10-CM | POA: Diagnosis not present

## 2017-08-12 DIAGNOSIS — M25562 Pain in left knee: Secondary | ICD-10-CM | POA: Diagnosis not present

## 2017-08-14 DIAGNOSIS — M25562 Pain in left knee: Secondary | ICD-10-CM | POA: Diagnosis not present

## 2017-08-14 DIAGNOSIS — M25561 Pain in right knee: Secondary | ICD-10-CM | POA: Diagnosis not present

## 2017-08-14 DIAGNOSIS — M25551 Pain in right hip: Secondary | ICD-10-CM | POA: Diagnosis not present

## 2017-08-15 DIAGNOSIS — M25551 Pain in right hip: Secondary | ICD-10-CM | POA: Diagnosis not present

## 2017-08-15 DIAGNOSIS — M25562 Pain in left knee: Secondary | ICD-10-CM | POA: Diagnosis not present

## 2017-08-15 DIAGNOSIS — M25561 Pain in right knee: Secondary | ICD-10-CM | POA: Diagnosis not present

## 2017-08-19 DIAGNOSIS — M25551 Pain in right hip: Secondary | ICD-10-CM | POA: Diagnosis not present

## 2017-08-19 DIAGNOSIS — M25562 Pain in left knee: Secondary | ICD-10-CM | POA: Diagnosis not present

## 2017-08-19 DIAGNOSIS — M25561 Pain in right knee: Secondary | ICD-10-CM | POA: Diagnosis not present

## 2017-08-21 DIAGNOSIS — M25561 Pain in right knee: Secondary | ICD-10-CM | POA: Diagnosis not present

## 2017-08-21 DIAGNOSIS — M25551 Pain in right hip: Secondary | ICD-10-CM | POA: Diagnosis not present

## 2017-08-21 DIAGNOSIS — M25562 Pain in left knee: Secondary | ICD-10-CM | POA: Diagnosis not present

## 2017-08-27 DIAGNOSIS — M25551 Pain in right hip: Secondary | ICD-10-CM | POA: Diagnosis not present

## 2017-08-27 DIAGNOSIS — M25562 Pain in left knee: Secondary | ICD-10-CM | POA: Diagnosis not present

## 2017-08-27 DIAGNOSIS — M25561 Pain in right knee: Secondary | ICD-10-CM | POA: Diagnosis not present

## 2017-08-30 DIAGNOSIS — M25551 Pain in right hip: Secondary | ICD-10-CM | POA: Diagnosis not present

## 2017-08-30 DIAGNOSIS — M25561 Pain in right knee: Secondary | ICD-10-CM | POA: Diagnosis not present

## 2017-08-30 DIAGNOSIS — M25562 Pain in left knee: Secondary | ICD-10-CM | POA: Diagnosis not present

## 2017-09-02 DIAGNOSIS — Z6821 Body mass index (BMI) 21.0-21.9, adult: Secondary | ICD-10-CM | POA: Diagnosis not present

## 2017-09-02 DIAGNOSIS — M25551 Pain in right hip: Secondary | ICD-10-CM | POA: Diagnosis not present

## 2017-09-02 DIAGNOSIS — R39191 Need to immediately re-void: Secondary | ICD-10-CM | POA: Diagnosis not present

## 2017-09-02 DIAGNOSIS — M25562 Pain in left knee: Secondary | ICD-10-CM | POA: Diagnosis not present

## 2017-09-02 DIAGNOSIS — M25561 Pain in right knee: Secondary | ICD-10-CM | POA: Diagnosis not present

## 2017-09-05 DIAGNOSIS — M25561 Pain in right knee: Secondary | ICD-10-CM | POA: Diagnosis not present

## 2017-09-05 DIAGNOSIS — M25562 Pain in left knee: Secondary | ICD-10-CM | POA: Diagnosis not present

## 2017-09-05 DIAGNOSIS — M25551 Pain in right hip: Secondary | ICD-10-CM | POA: Diagnosis not present

## 2017-10-03 DIAGNOSIS — J069 Acute upper respiratory infection, unspecified: Secondary | ICD-10-CM | POA: Diagnosis not present

## 2017-10-03 DIAGNOSIS — Z6821 Body mass index (BMI) 21.0-21.9, adult: Secondary | ICD-10-CM | POA: Diagnosis not present

## 2017-10-03 DIAGNOSIS — E538 Deficiency of other specified B group vitamins: Secondary | ICD-10-CM | POA: Diagnosis not present

## 2017-10-15 DIAGNOSIS — Z961 Presence of intraocular lens: Secondary | ICD-10-CM | POA: Diagnosis not present

## 2017-10-18 DIAGNOSIS — H35033 Hypertensive retinopathy, bilateral: Secondary | ICD-10-CM | POA: Diagnosis not present

## 2017-10-18 DIAGNOSIS — Z961 Presence of intraocular lens: Secondary | ICD-10-CM | POA: Diagnosis not present

## 2017-10-18 DIAGNOSIS — H40013 Open angle with borderline findings, low risk, bilateral: Secondary | ICD-10-CM | POA: Diagnosis not present

## 2017-10-18 DIAGNOSIS — H26493 Other secondary cataract, bilateral: Secondary | ICD-10-CM | POA: Diagnosis not present

## 2017-10-29 DIAGNOSIS — M170 Bilateral primary osteoarthritis of knee: Secondary | ICD-10-CM | POA: Diagnosis not present

## 2017-11-04 ENCOUNTER — Emergency Department (HOSPITAL_COMMUNITY): Payer: Medicare Other

## 2017-11-04 ENCOUNTER — Other Ambulatory Visit: Payer: Self-pay

## 2017-11-04 ENCOUNTER — Emergency Department (HOSPITAL_COMMUNITY)
Admission: EM | Admit: 2017-11-04 | Discharge: 2017-11-04 | Disposition: A | Discharge status: Home / Self Care | Payer: Medicare Other | Attending: Emergency Medicine | Admitting: Emergency Medicine

## 2017-11-04 ENCOUNTER — Encounter (HOSPITAL_COMMUNITY): Payer: Self-pay | Admitting: Cardiology

## 2017-11-04 DIAGNOSIS — E871 Hypo-osmolality and hyponatremia: Secondary | ICD-10-CM

## 2017-11-04 DIAGNOSIS — G4489 Other headache syndrome: Secondary | ICD-10-CM | POA: Diagnosis not present

## 2017-11-04 DIAGNOSIS — E876 Hypokalemia: Secondary | ICD-10-CM | POA: Diagnosis not present

## 2017-11-04 DIAGNOSIS — R410 Disorientation, unspecified: Secondary | ICD-10-CM | POA: Diagnosis not present

## 2017-11-04 DIAGNOSIS — R51 Headache: Secondary | ICD-10-CM | POA: Diagnosis not present

## 2017-11-04 DIAGNOSIS — R519 Headache, unspecified: Secondary | ICD-10-CM

## 2017-11-04 DIAGNOSIS — I10 Essential (primary) hypertension: Secondary | ICD-10-CM | POA: Diagnosis not present

## 2017-11-04 DIAGNOSIS — H26492 Other secondary cataract, left eye: Secondary | ICD-10-CM | POA: Diagnosis not present

## 2017-11-04 DIAGNOSIS — Z79899 Other long term (current) drug therapy: Secondary | ICD-10-CM | POA: Insufficient documentation

## 2017-11-04 DIAGNOSIS — R4182 Altered mental status, unspecified: Secondary | ICD-10-CM | POA: Insufficient documentation

## 2017-11-04 DIAGNOSIS — R531 Weakness: Secondary | ICD-10-CM | POA: Diagnosis not present

## 2017-11-04 DIAGNOSIS — R404 Transient alteration of awareness: Secondary | ICD-10-CM | POA: Diagnosis not present

## 2017-11-04 LAB — COMPREHENSIVE METABOLIC PANEL
ALT: 16 U/L (ref 14–54)
ANION GAP: 12 (ref 5–15)
AST: 25 U/L (ref 15–41)
Albumin: 3.4 g/dL — ABNORMAL LOW (ref 3.5–5.0)
Alkaline Phosphatase: 62 U/L (ref 38–126)
BILIRUBIN TOTAL: 0.8 mg/dL (ref 0.3–1.2)
BUN: 14 mg/dL (ref 6–20)
CO2: 25 mmol/L (ref 22–32)
Calcium: 8.7 mg/dL — ABNORMAL LOW (ref 8.9–10.3)
Chloride: 91 mmol/L — ABNORMAL LOW (ref 101–111)
Creatinine, Ser: 0.69 mg/dL (ref 0.44–1.00)
Glucose, Bld: 113 mg/dL — ABNORMAL HIGH (ref 65–99)
POTASSIUM: 2.7 mmol/L — AB (ref 3.5–5.1)
Sodium: 128 mmol/L — ABNORMAL LOW (ref 135–145)
TOTAL PROTEIN: 5.9 g/dL — AB (ref 6.5–8.1)

## 2017-11-04 LAB — CBC WITH DIFFERENTIAL/PLATELET
Basophils Absolute: 0 10*3/uL (ref 0.0–0.1)
Basophils Relative: 0 %
Eosinophils Absolute: 0.1 10*3/uL (ref 0.0–0.7)
Eosinophils Relative: 1 %
HEMATOCRIT: 36 % (ref 36.0–46.0)
Hemoglobin: 12.4 g/dL (ref 12.0–15.0)
LYMPHS PCT: 18 %
Lymphs Abs: 1.8 10*3/uL (ref 0.7–4.0)
MCH: 32.2 pg (ref 26.0–34.0)
MCHC: 34.4 g/dL (ref 30.0–36.0)
MCV: 93.5 fL (ref 78.0–100.0)
MONO ABS: 1.3 10*3/uL (ref 0.1–1.0)
Monocytes Relative: 13 %
Neutro Abs: 6.8 10*3/uL (ref 1.7–7.7)
Neutrophils Relative %: 68 %
Platelets: 213 10*3/uL (ref 150–400)
RBC: 3.85 MIL/uL — ABNORMAL LOW (ref 3.87–5.11)
RDW: 12.1 % (ref 11.5–15.5)
WBC: 10 10*3/uL (ref 4.0–10.5)

## 2017-11-04 LAB — URINALYSIS, ROUTINE W REFLEX MICROSCOPIC
BILIRUBIN URINE: NEGATIVE
Glucose, UA: NEGATIVE mg/dL
Hgb urine dipstick: NEGATIVE
KETONES UR: NEGATIVE mg/dL
Leukocytes, UA: NEGATIVE
NITRITE: NEGATIVE
PH: 7 (ref 5.0–8.0)
Protein, ur: NEGATIVE mg/dL
Specific Gravity, Urine: 1.004 — ABNORMAL LOW (ref 1.005–1.030)

## 2017-11-04 LAB — PROTIME-INR
INR: 0.94
PROTHROMBIN TIME: 12.5 s (ref 11.4–15.2)

## 2017-11-04 MED ORDER — MAGNESIUM SULFATE 2 GM/50ML IV SOLN
2.0000 g | INTRAVENOUS | Status: AC
  Administered 2017-11-04: 2 g via INTRAVENOUS
  Filled 2017-11-04: qty 50

## 2017-11-04 MED ORDER — POTASSIUM CHLORIDE 10 MEQ/100ML IV SOLN
10.0000 meq | Freq: Once | INTRAVENOUS | Status: AC
  Administered 2017-11-04: 10 meq via INTRAVENOUS
  Filled 2017-11-04: qty 100

## 2017-11-04 MED ORDER — SODIUM CHLORIDE 0.9 % IV SOLN
INTRAVENOUS | Status: AC
Start: 2017-11-04 — End: 2017-11-04
  Administered 2017-11-04: 17:00:00 via INTRAVENOUS

## 2017-11-04 MED ORDER — POTASSIUM CHLORIDE CRYS ER 20 MEQ PO TBCR
40.0000 meq | EXTENDED_RELEASE_TABLET | Freq: Once | ORAL | Status: AC
  Administered 2017-11-04: 40 meq via ORAL
  Filled 2017-11-04: qty 2

## 2017-11-04 MED ORDER — POTASSIUM CHLORIDE ER 10 MEQ PO TBCR: 10.0000 meq | EXTENDED_RELEASE_TABLET | Freq: Every day | ORAL | 0 refills | Status: DC

## 2017-11-04 NOTE — ED Notes (Signed)
Pt walked to bathroom with minimal assistance. Linen changed. Pt is incontinent of urine.

## 2017-11-04 NOTE — ED Notes (Signed)
Pt. went to [facility] for Urine sample, but missed the hat.

## 2017-11-04 NOTE — ED Provider Notes (Signed)
[facility] EMERGENCY DEPARTMENT Provider Note   CSN: [id number] Arrival date & time: 11/04/17  1334     History   Chief Complaint Chief Complaint  Patient presents with  . Headache    HPI Claire West is a 77 y.o. female.  HPI  Patient is a 77 year old female, she is very pleasant, she has no history of dementia or cognitive, she does have a history of high blood pressure depression and acid reflux.  Reportedly according to the sister who is the primary historian given the patient's altered mental status she had been in her usual state of health until the last several days.  The patient's son who lives in [place] had been in communication with the patient's sister stating that she had had some mild confusion over the last several days.  This morning the patient did not know where she was going, she knew that she was supposed to have an eye procedure but did not know with whom or where and this is unusual for her.  She went to the procedure, it was done successfully, she reports that while she was getting her lens buffed, this did occur in the office, she had a severe headache.  The headache persisted when she left the office and the sister took her to the restaurant where she had a short stack of pancakes successfully without any vomiting or nausea, she then went home and was resting on the bed.  She called her sister up after approximately 1/2 hours during which time the sister noted that the patient was severely weak and was unable to stand up at the side of the bed.  She collapsed and was essentially unresponsive on the floor.  The patient was too weak to get up, she was transported to the hospital by paramedics because of the severe weakness.  On arrival the paramedics noted that the patient was confused and altered such that she could answer 3 out of 4 questions, during the transport to the hospital she was noted to have a full mental status at baseline.  She was noted to be  hypertensive at 190/120 on their initial arrival but this normalized by arrival.  The sister who is here with the patient states that she has been slightly confused today but otherwise pleasant and there is been no vomiting diarrhea or complaints of numbness or weakness.  She does walk with a walker at baseline and has been able to do that today prior to this event.  Past Medical History:  Diagnosis Date  . Depression   . GERD (gastroesophageal reflux disease)   . Hypertension   . Seasonal allergies     Patient Active Problem List   Diagnosis Date Noted  . Hyponatremia 02/12/2016  . Hypokalemia 02/12/2016  . Dehydration 02/12/2016  . Elevated LFTs 02/12/2016  . HTN (hypertension) 02/12/2016  . GERD (gastroesophageal reflux disease) 02/12/2016    Past Surgical History:  Procedure Laterality Date  . ABDOMINAL HYSTERECTOMY    . ARTHROSCOPIC REPAIR ACL    . COLONOSCOPY    . COLONOSCOPY N/A 11/23/2015   Procedure: COLONOSCOPY;  Surgeon: Rogene Houston, MD;  Location: [facility];  Service: Endoscopy;  Laterality: N/A;  65    OB History    Gravida Para Term Preterm AB Living   1 1 1     1    SAB TAB Ectopic Multiple Live Births  Home Medications    Prior to Admission medications   Medication Sig Start Date End Date Taking? Authorizing Provider  Cholecalciferol (VITAMIN D PO) Take 1 capsule by mouth daily.   Yes [provider]  escitalopram (LEXAPRO) 5 MG tablet Take 5 mg by mouth at bedtime.  04/23/15  Yes [provider]  fluticasone (FLONASE) 50 MCG/ACT nasal spray Place 2 sprays into both nostrils daily as needed for allergies.  04/16/15  Yes [provider]  losartan-hydrochlorothiazide (HYZAAR) 100-12.5 MG per tablet Take 1 tablet by mouth daily. 04/16/15  Yes [provider]  meloxicam (MOBIC) 15 MG tablet Take 1 tablet by mouth daily. 01/18/16  Yes [provider]  metoprolol tartrate (LOPRESSOR) 50 MG tablet  Take 50 mg by mouth 2 (two) times daily.  11/02/17  Yes [provider]  Multiple Vitamin (MULTIVITAMIN WITH MINERALS) TABS tablet Take 1 tablet by mouth daily.   Yes [provider]  omeprazole (PRILOSEC) 20 MG capsule Take 20 mg by mouth every other day.  04/18/15  Yes [provider]  potassium chloride (K-DUR) 10 MEQ tablet Take 1 tablet (10 mEq total) by mouth daily. 11/04/17   Noemi Chapel, MD    Family History Family History  Problem Relation Age of Onset  . Lung cancer Mother   . Lung cancer Sister     Social History Social History   Tobacco Use  . Smoking status: Never Smoker  . Smokeless tobacco: Never Used  Substance Use Topics  . Alcohol use: No  . Drug use: No     Allergies   Codeine; Penicillins; and Sulfa antibiotics   Review of Systems Review of Systems  All other systems reviewed and are negative.    Physical Exam Updated Vital Signs BP (!) 153/71   Pulse 72   Temp (!) 97.5 F (36.4 C) (Oral)   Resp 13   Ht 5\' 6"  (1.676 m)   Wt 54.4 kg (120 lb)   SpO2 99%   BMI 19.37 kg/m   Physical Exam  Constitutional: She appears well-developed and well-nourished. No distress.  HENT:  Head: Normocephalic and atraumatic.  Mouth/Throat: Oropharynx is clear and moist. No oropharyngeal exudate.  Eyes: Conjunctivae and EOM are normal. Right eye exhibits no discharge. Left eye exhibits no discharge. No scleral icterus.  Left eye is dilated  Neck: Normal range of motion. Neck supple. No JVD present. No thyromegaly present.  Cardiovascular: Normal rate, regular rhythm, normal heart sounds and intact distal pulses. Exam reveals no gallop and no friction rub.  No murmur heard. Pulmonary/Chest: Effort normal and breath sounds normal. No respiratory distress. She has no wheezes. She has no rales.  Abdominal: Soft. Bowel sounds are normal. She exhibits no distension and no mass. There is no tenderness.  Musculoskeletal: Normal range of  motion. She exhibits no edema or tenderness.  Lymphadenopathy:    She has no cervical adenopathy.  Neurological: She is alert. Coordination normal.  The patient is very slow to follow commands and sometimes needs 2 or 3 request before she performs it.  When she does perform commands she has normal strength bilaterally, normal coordination with finger-nose-finger, no facial droop, clear speech, she is able to straight leg raise bilaterally without difficulty.  Skin: Skin is warm and dry. No rash noted. No erythema.  Psychiatric: She has a normal mood and affect. Her behavior is normal.  Nursing note and vitals reviewed.    ED Treatments / Results  Labs (all labs ordered  are listed, but only abnormal results are displayed) Labs Reviewed  CBC WITH DIFFERENTIAL/PLATELET - Abnormal; Notable for the following components:      Result Value   RBC 3.85 (*)    All other components within normal limits  COMPREHENSIVE METABOLIC PANEL - Abnormal; Notable for the following components:   Sodium 128 (*)    Potassium 2.7 (*)    Chloride 91 (*)    Glucose, Bld 113 (*)    Calcium 8.7 (*)    Total Protein 5.9 (*)    Albumin 3.4 (*)    All other components within normal limits  URINALYSIS, ROUTINE W REFLEX MICROSCOPIC - Abnormal; Notable for the following components:   Color, Urine STRAW (*)    Specific Gravity, Urine 1.004 (*)    All other components within normal limits  URINE CULTURE  PROTIME-INR    EKG  EKG Interpretation None       Radiology Ct Head Wo Contrast  Result Date: 11/04/2017 CLINICAL DATA:  Altered mental status and confusion today, history hypertension EXAM: CT HEAD WITHOUT CONTRAST TECHNIQUE: Contiguous axial images were obtained from the base of the skull through the vertex without intravenous contrast. Sagittal and coronal MPR images reconstructed from axial data set. COMPARISON:  05/11/2017 FINDINGS: Brain: Generalized atrophy. Normal ventricular morphology. No midline  shift or mass effect. Small vessel chronic ischemic changes of deep cerebral white matter. Small old lacunar infarct RIGHT thalamus. No intracranial hemorrhage, mass lesion, evidence of acute infarction, or extra-axial fluid collection. Vascular: Atherosclerotic calcification of internal carotid arteries at skull base. No hyperdense vessels. Skull: Intact Sinuses/Orbits: Clear Other: N/A IMPRESSION: Atrophy with small vessel chronic ischemic changes of deep cerebral white matter. Small old lacunar infarct at RIGHT thalamus. No acute intracranial abnormalities. Electronically Signed   By: Lavonia Dana M.D.   On: 11/04/2017 15:07    Procedures Procedures (including critical care time)  Medications Ordered in ED Medications  0.9 %  sodium chloride infusion ( Intravenous Stopped 11/04/17 1846)  magnesium sulfate IVPB 2 g 50 mL (0 g Intravenous Stopped 11/04/17 1802)  potassium chloride 10 mEq in 100 mL IVPB (0 mEq Intravenous Stopped 11/04/17 1651)  potassium chloride SA (K-DUR,KLOR-CON) CR tablet 40 mEq (40 mEq Oral Given 11/04/17 1635)     Initial Impression / Assessment and Plan / ED Course  I have reviewed the triage vital signs and the nursing notes.  Pertinent labs & imaging results that were available during my care of the patient were reviewed by me and considered in my medical decision making (see chart for details).  Clinical Course as of Nov 04 1912  Mon Nov 04, 2017  1612 CT scan results reviewed, I have personally viewed the images as well.  There does appear to be old stroke but no acute findings.  Labs reveal hypokalemia, hyponatremia  [BM]  1613 Sodium: (!) 128 [BM]  1613 Potassium: (!!) 2.7 [BM]  1613 Creatinine: 0.69 [BM]  1613 WBC: 10.0 [BM]  1613 Hemoglobin: 12.4 [BM]  1613 Hemoglobin and white blood cell counts unremarkable.  The patient will be given replacement potassium and some IV fluids for her hyponatremia.  [BM]  7846 The patient was given medications as above, I  reviewed the lab results with the patient and family member, vital signs are normal with a blood pressure of 153/71, she has ambulated without difficulty to the bathroom, no assistance was required.  She states that she does not have a headache, the CT scan was unremarkable, the  patient will need to follow-up in the outpatient setting to have her electrolytes rechecked.  [BM]    Clinical Course User Index [BM] Noemi Chapel, MD   The patient has no focal neurologic deficits on my exam other than her slowed cognition, she will need a formal imaging study of her brain to make sure there is been no bleeding especially with the severe headache during the procedure however it appears that she has had some decline over the last 3 days with regards to confusion and thus I would suggest that a metabolic and infectious workup would be also of some value.  At this time the patient states that she has absolutely no headache.  Her blood pressure was noted to be in a relatively normal range  Vitals:   11/04/17 1346 11/04/17 1347 11/04/17 1348 11/04/17 1358  BP: 134/88  134/88   Pulse:  87    Resp:   19   Temp:   (!) 97.5 F (36.4 C)   TempSrc:   Oral   SpO2:  99% 100%   Weight:    54.4 kg (120 lb)  Height:    5\' 6"  (1.676 m)     Final Clinical Impressions(s) / ED Diagnoses   Final diagnoses:  Bad headache  Hyponatremia  Hypokalemia    ED Discharge Orders        Ordered    potassium chloride (K-DUR) 10 MEQ tablet  Daily     11/04/17 1914       Noemi Chapel, MD 11/04/17 1914

## 2017-11-04 NOTE — ED Triage Notes (Addendum)
EMS called out for weakness.  When EMS arrived pt was laying in the floor.  Pt had cataract surgery this morning.  Pt c/o headache. Pt confused and unable to stand  initially with EMS.  CBG 115.  B/P 190/120.  Pt States at this time her headache has improved and b/p now 128/70.  Pt oriented at this time.  Pt ambulated without difficulty from EMS stretcher to bed.

## 2017-11-04 NOTE — ED Notes (Signed)
Pt wheeled to car with family. Pt verbalized understanding of discharge instructions.

## 2017-11-04 NOTE — ED Notes (Signed)
CRITICAL VALUE ALERT  Critical Value:  Potassium 2.7  Date & Time Notied:  11/04/2017 @ 1520  Provider Notified: Dr. Sabra Heck  Orders Received/Actions taken: No new orders obtained

## 2017-11-04 NOTE — Discharge Instructions (Signed)
Please have your doctor recheck your potassium and sodium in the next 5 days Potassium daily for 5 more days Drink plenty of fluids ER for worsening symptoms

## 2017-11-06 ENCOUNTER — Other Ambulatory Visit: Payer: Self-pay

## 2017-11-06 ENCOUNTER — Observation Stay (HOSPITAL_COMMUNITY)
Admission: EM | Admit: 2017-11-06 | Discharge: 2017-11-08 | Disposition: A | Discharge status: Home / Self Care | Payer: Medicare Other | Attending: Internal Medicine | Admitting: Internal Medicine

## 2017-11-06 ENCOUNTER — Observation Stay (HOSPITAL_COMMUNITY): Payer: Medicare Other

## 2017-11-06 ENCOUNTER — Emergency Department (HOSPITAL_COMMUNITY): Payer: Medicare Other

## 2017-11-06 ENCOUNTER — Encounter (HOSPITAL_COMMUNITY): Payer: Self-pay

## 2017-11-06 DIAGNOSIS — R410 Disorientation, unspecified: Secondary | ICD-10-CM | POA: Insufficient documentation

## 2017-11-06 DIAGNOSIS — R4182 Altered mental status, unspecified: Secondary | ICD-10-CM | POA: Diagnosis not present

## 2017-11-06 DIAGNOSIS — R2689 Other abnormalities of gait and mobility: Secondary | ICD-10-CM | POA: Insufficient documentation

## 2017-11-06 DIAGNOSIS — M6281 Muscle weakness (generalized): Secondary | ICD-10-CM | POA: Diagnosis not present

## 2017-11-06 DIAGNOSIS — E876 Hypokalemia: Principal | ICD-10-CM | POA: Insufficient documentation

## 2017-11-06 DIAGNOSIS — I10 Essential (primary) hypertension: Secondary | ICD-10-CM | POA: Insufficient documentation

## 2017-11-06 DIAGNOSIS — Z79899 Other long term (current) drug therapy: Secondary | ICD-10-CM | POA: Insufficient documentation

## 2017-11-06 DIAGNOSIS — G9341 Metabolic encephalopathy: Secondary | ICD-10-CM | POA: Insufficient documentation

## 2017-11-06 DIAGNOSIS — R269 Unspecified abnormalities of gait and mobility: Secondary | ICD-10-CM | POA: Diagnosis not present

## 2017-11-06 DIAGNOSIS — R2681 Unsteadiness on feet: Secondary | ICD-10-CM | POA: Insufficient documentation

## 2017-11-06 DIAGNOSIS — E871 Hypo-osmolality and hyponatremia: Secondary | ICD-10-CM | POA: Diagnosis not present

## 2017-11-06 LAB — CBC WITH DIFFERENTIAL/PLATELET
BASOS ABS: 0 10*3/uL (ref 0.0–0.1)
Basophils Relative: 0 %
EOS ABS: 0.1 10*3/uL (ref 0.0–0.7)
Eosinophils Relative: 1 %
HCT: 37.3 % (ref 36.0–46.0)
Hemoglobin: 12.8 g/dL (ref 12.0–15.0)
LYMPHS ABS: 1.8 10*3/uL (ref 0.7–4.0)
Lymphocytes Relative: 19 %
MCH: 32.3 pg (ref 26.0–34.0)
MCHC: 34.3 g/dL (ref 30.0–36.0)
MCV: 94.2 fL (ref 78.0–100.0)
Monocytes Absolute: 1.1 10*3/uL — ABNORMAL HIGH (ref 0.1–1.0)
Monocytes Relative: 12 %
NEUTROS ABS: 6.4 10*3/uL (ref 1.7–7.7)
Neutrophils Relative %: 68 %
Platelets: 241 10*3/uL (ref 150–400)
RBC: 3.96 MIL/uL (ref 3.87–5.11)
RDW: 11.9 % (ref 11.5–15.5)
WBC: 9.4 10*3/uL (ref 4.0–10.5)

## 2017-11-06 LAB — COMPREHENSIVE METABOLIC PANEL
ALT: 20 U/L (ref 14–54)
AST: 23 U/L (ref 15–41)
Albumin: 3.8 g/dL (ref 3.5–5.0)
Alkaline Phosphatase: 71 U/L (ref 38–126)
Anion gap: 11 (ref 5–15)
BILIRUBIN TOTAL: 0.5 mg/dL (ref 0.3–1.2)
BUN: 12 mg/dL (ref 6–20)
CALCIUM: 9 mg/dL (ref 8.9–10.3)
CO2: 26 mmol/L (ref 22–32)
CREATININE: 0.68 mg/dL (ref 0.44–1.00)
Chloride: 91 mmol/L — ABNORMAL LOW (ref 101–111)
GFR calc Af Amer: >60 mL/min (ref >60)
Glucose, Bld: 115 mg/dL — ABNORMAL HIGH (ref 65–99)
Potassium: 2.9 mmol/L — ABNORMAL LOW (ref 3.5–5.1)
Sodium: 128 mmol/L — ABNORMAL LOW (ref 135–145)
TOTAL PROTEIN: 6.8 g/dL (ref 6.5–8.1)

## 2017-11-06 LAB — URINE CULTURE

## 2017-11-06 LAB — URINALYSIS, ROUTINE W REFLEX MICROSCOPIC
BILIRUBIN URINE: NEGATIVE
Glucose, UA: NEGATIVE mg/dL
HGB URINE DIPSTICK: NEGATIVE
KETONES UR: NEGATIVE mg/dL
Leukocytes, UA: NEGATIVE
NITRITE: NEGATIVE
Protein, ur: NEGATIVE mg/dL
Specific Gravity, Urine: 1.004 — ABNORMAL LOW (ref 1.005–1.030)
pH: 7 (ref 5.0–8.0)

## 2017-11-06 LAB — RAPID URINE DRUG SCREEN, HOSP PERFORMED
Amphetamines: NOT DETECTED
Barbiturates: NOT DETECTED
Benzodiazepines: NOT DETECTED
Cocaine: NOT DETECTED
OPIATES: NOT DETECTED
Tetrahydrocannabinol: NOT DETECTED

## 2017-11-06 LAB — SODIUM, URINE, RANDOM: SODIUM UR: 57 mmol/L

## 2017-11-06 LAB — AMMONIA: Ammonia: 16 umol/L (ref 9–35)

## 2017-11-06 LAB — VITAMIN B12: Vitamin B-12: 1006 pg/mL — ABNORMAL HIGH (ref 180–914)

## 2017-11-06 LAB — CORTISOL: CORTISOL PLASMA: 3 ug/dL

## 2017-11-06 LAB — TROPONIN I

## 2017-11-06 LAB — SEDIMENTATION RATE: Sed Rate: 10 mm/hr (ref 0–22)

## 2017-11-06 LAB — TSH: TSH: 0.942 u[IU]/mL (ref 0.350–4.500)

## 2017-11-06 LAB — OSMOLALITY: OSMOLALITY: 280 mosm/kg (ref 275–295)

## 2017-11-06 LAB — OSMOLALITY, URINE: OSMOLALITY UR: 230 mosm/kg — AB (ref 300–900)

## 2017-11-06 MED ORDER — ENOXAPARIN SODIUM 40 MG/0.4ML ~~LOC~~ SOLN
40.0000 mg | SUBCUTANEOUS | Status: DC
  Administered 2017-11-06 – 2017-11-07 (×2): 40 mg via SUBCUTANEOUS
  Filled 2017-11-06 (×2): qty 0.4

## 2017-11-06 MED ORDER — PANTOPRAZOLE SODIUM 40 MG PO TBEC
40.0000 mg | DELAYED_RELEASE_TABLET | Freq: Every day | ORAL | Status: DC
  Administered 2017-11-07 – 2017-11-08 (×2): 40 mg via ORAL
  Filled 2017-11-06 (×2): qty 1

## 2017-11-06 MED ORDER — ACETAMINOPHEN 325 MG PO TABS: 650.0000 mg | ORAL_TABLET | Freq: Four times a day (QID) | ORAL | Status: DC | PRN

## 2017-11-06 MED ORDER — SODIUM CHLORIDE 0.9 % IV BOLUS (SEPSIS)
1000.0000 mL | Freq: Once | INTRAVENOUS | Status: AC
  Administered 2017-11-06: 1000 mL via INTRAVENOUS

## 2017-11-06 MED ORDER — CITALOPRAM HYDROBROMIDE 20 MG PO TABS
20.0000 mg | ORAL_TABLET | Freq: Every day | ORAL | Status: DC
  Administered 2017-11-06 – 2017-11-07 (×2): 20 mg via ORAL
  Filled 2017-11-06 (×2): qty 1

## 2017-11-06 MED ORDER — CITALOPRAM HYDROBROMIDE 20 MG PO TABS: 20.0000 mg | ORAL_TABLET | Freq: Every day | ORAL | Status: DC

## 2017-11-06 MED ORDER — MELOXICAM 7.5 MG PO TABS
15.0000 mg | ORAL_TABLET | Freq: Every day | ORAL | Status: DC
  Administered 2017-11-06 – 2017-11-08 (×3): 15 mg via ORAL
  Filled 2017-11-06 (×3): qty 2

## 2017-11-06 MED ORDER — SODIUM CHLORIDE 0.9 % IV SOLN
INTRAVENOUS | Status: DC
  Administered 2017-11-06: 19:00:00 via INTRAVENOUS

## 2017-11-06 MED ORDER — SODIUM CHLORIDE 0.9% FLUSH
3.0000 mL | Freq: Two times a day (BID) | INTRAVENOUS | Status: DC
  Administered 2017-11-06 – 2017-11-08 (×3): 3 mL via INTRAVENOUS

## 2017-11-06 MED ORDER — SODIUM CHLORIDE 0.9 % IV SOLN: 250.0000 mL | INTRAVENOUS | Status: DC | PRN

## 2017-11-06 MED ORDER — LOSARTAN POTASSIUM 50 MG PO TABS: 100.0000 mg | ORAL_TABLET | Freq: Every day | ORAL | Status: DC

## 2017-11-06 MED ORDER — SODIUM CHLORIDE 0.9% FLUSH: 3.0000 mL | INTRAVENOUS | Status: DC | PRN

## 2017-11-06 MED ORDER — METOPROLOL TARTRATE 50 MG PO TABS
50.0000 mg | ORAL_TABLET | Freq: Two times a day (BID) | ORAL | Status: DC
  Administered 2017-11-06 – 2017-11-08 (×4): 50 mg via ORAL
  Filled 2017-11-06 (×4): qty 1

## 2017-11-06 MED ORDER — HYDRALAZINE HCL 20 MG/ML IJ SOLN: 20.0000 mg | Freq: Four times a day (QID) | INTRAMUSCULAR | Status: DC | PRN

## 2017-11-06 MED ORDER — POTASSIUM CHLORIDE CRYS ER 10 MEQ PO TBCR
10.0000 meq | EXTENDED_RELEASE_TABLET | Freq: Every day | ORAL | Status: DC
  Administered 2017-11-06: 10 meq via ORAL
  Filled 2017-11-06 (×4): qty 1

## 2017-11-06 MED ORDER — ACETAMINOPHEN 650 MG RE SUPP: 650.0000 mg | Freq: Four times a day (QID) | RECTAL | Status: DC | PRN

## 2017-11-06 MED ORDER — POTASSIUM CHLORIDE CRYS ER 20 MEQ PO TBCR
40.0000 meq | EXTENDED_RELEASE_TABLET | Freq: Once | ORAL | Status: AC
  Administered 2017-11-06: 40 meq via ORAL
  Filled 2017-11-06: qty 2

## 2017-11-06 NOTE — ED Triage Notes (Addendum)
Family reports pt has been confused and lethargic since last Wednesday.  Reports was evaluated here Monday and her bp was elevated, NA, and K were low.  Pt still c/o weakness and confusion.  Denies any pain.

## 2017-11-06 NOTE — ED Provider Notes (Signed)
[facility] Provider Note   CSN: [id number] Arrival date & time: 11/06/17  1251     History   Chief Complaint Chief Complaint  Patient presents with  . Altered Mental Status    HPI Claire West is a 77 y.o. female.  The history is provided by the patient and a relative.  Altered Mental Status   This is a chronic problem. The current episode started more than 1 week ago. The problem has been gradually worsening. Associated symptoms include confusion and weakness. Pertinent negatives include no seizures, no self-injury and no violence. Her past medical history does not include seizures or head trauma.    Patient brought in by her sister for continued confusion. She was here a few days ago for same. Was found to have hyponatremia and hypokalemia. Is on repleation. Patient denies complaints. Level 5 caviat for altered mental status. No reports of alcohol or drugs. Has been taking meds regularly. Has not seen pcp for awhile. No reported vomiting or diarrhea. No trauma. Per sister has been confused for awhile, but moreso lately and generalized weakness.    Past Medical History:  Diagnosis Date  . Depression   . GERD (gastroesophageal reflux disease)   . Hypertension   . Seasonal allergies     Patient Active Problem List   Diagnosis Date Noted  . Altered mental status 11/06/2017  . Hyponatremia 02/12/2016  . Hypokalemia 02/12/2016  . Dehydration 02/12/2016  . Elevated LFTs 02/12/2016  . HTN (hypertension) 02/12/2016  . GERD (gastroesophageal reflux disease) 02/12/2016    Past Surgical History:  Procedure Laterality Date  . ABDOMINAL HYSTERECTOMY    . ARTHROSCOPIC REPAIR ACL    . COLONOSCOPY    . COLONOSCOPY N/A 11/23/2015   Procedure: COLONOSCOPY;  Surgeon: Rogene Houston, MD;  Location: [facility];  Service: Endoscopy;  Laterality: N/A;  88    OB History    Gravida Para Term Preterm AB Living   1 1 1     1    SAB TAB Ectopic Multiple  Live Births                   Home Medications    Prior to Admission medications   Medication Sig Start Date End Date Taking? Authorizing Provider  Cholecalciferol (VITAMIN D PO) Take 1 capsule by mouth daily.   Yes [provider]  escitalopram (LEXAPRO) 5 MG tablet Take 5 mg by mouth at bedtime.  04/23/15  Yes [provider]  losartan-hydrochlorothiazide (HYZAAR) 100-12.5 MG per tablet Take 1 tablet by mouth daily. 04/16/15  Yes [provider]  meloxicam (MOBIC) 15 MG tablet Take 1 tablet by mouth daily. 01/18/16  Yes [provider]  metoprolol tartrate (LOPRESSOR) 50 MG tablet Take 50 mg by mouth 2 (two) times daily.  11/02/17  Yes [provider]  Multiple Vitamin (MULTIVITAMIN WITH MINERALS) TABS tablet Take 1 tablet by mouth daily.   Yes [provider]  omeprazole (PRILOSEC) 20 MG capsule Take 20 mg by mouth every other day.  04/18/15  Yes [provider]  potassium chloride (K-DUR) 10 MEQ tablet Take 1 tablet (10 mEq total) by mouth daily. 11/04/17  Yes Noemi Chapel, MD    Family History Family History  Problem Relation Age of Onset  . Lung cancer Mother   . Lung cancer Sister     Social History Social History   Tobacco Use  . Smoking status: Never Smoker  . Smokeless  tobacco: Never Used  Substance Use Topics  . Alcohol use: No  . Drug use: No     Allergies   Codeine; Penicillins; and Sulfa antibiotics   Review of Systems Review of Systems  Constitutional: Negative for chills and fever.  HENT: Negative for ear pain and sore throat.   Eyes: Negative for pain and visual disturbance.  Respiratory: Negative for cough and shortness of breath.   Cardiovascular: Negative for chest pain and palpitations.  Gastrointestinal: Negative for abdominal pain and vomiting.  Genitourinary: Negative for dysuria and hematuria.  Musculoskeletal: Negative for arthralgias and back pain.  Skin: Negative for color change  and rash.  Neurological: Positive for weakness. Negative for seizures and syncope.  Psychiatric/Behavioral: Positive for confusion. Negative for self-injury.  All other systems reviewed and are negative.    Physical Exam Updated Vital Signs BP (!) 150/75 (BP Location: Right Arm)   Pulse 93   Temp 98.2 F (36.8 C) (Oral)   Resp 16   Ht 5\' 6"  (1.676 m)   Wt 55.1 kg (121 lb 6.4 oz)   SpO2 100%   BMI 19.59 kg/m   Physical Exam  Constitutional: She appears well-developed and well-nourished. No distress.  HENT:  Head: Normocephalic and atraumatic.  Eyes: Conjunctivae are normal.  Neck: Neck supple.  Cardiovascular: Normal rate and regular rhythm.  No murmur heard. Pulmonary/Chest: Effort normal and breath sounds normal. No respiratory distress.  Abdominal: Soft. There is no tenderness.  Musculoskeletal: She exhibits no edema.  Neurological: She is alert.  Skin: Skin is warm and dry.  Psychiatric: She has a normal mood and affect.  Nursing note and vitals reviewed.    ED Treatments / Results  Labs (all labs ordered are listed, but only abnormal results are displayed) Labs Reviewed  COMPREHENSIVE METABOLIC PANEL - Abnormal; Notable for the following components:      Result Value   Sodium 128 (*)    Potassium 2.9 (*)    Chloride 91 (*)    Glucose, Bld 115 (*)    All other components within normal limits  CBC WITH DIFFERENTIAL/PLATELET - Abnormal; Notable for the following components:   Monocytes Absolute 1.1 (*)    All other components within normal limits  URINALYSIS, ROUTINE W REFLEX MICROSCOPIC - Abnormal; Notable for the following components:   Color, Urine STRAW (*)    Specific Gravity, Urine 1.004 (*)    All other components within normal limits  URINE CULTURE  TROPONIN I  AMMONIA  TSH  SEDIMENTATION RATE  SODIUM, URINE, RANDOM  RAPID URINE DRUG SCREEN, HOSP PERFORMED  VITAMIN B12  FOLATE RBC  RPR  ANTINUCLEAR ANTIBODIES, IFA  CORTISOL  OSMOLALITY    OSMOLALITY, URINE  COMPREHENSIVE METABOLIC PANEL  CBC    EKG  EKG Interpretation  Date/Time:  Wednesday November 06 2017 13:03:30 EST Ventricular Rate:  81 PR Interval:    QRS Duration: 102 QT Interval:  371 QTC Calculation: 431 R Axis:   -16 Text Interpretation:  Sinus rhythm Left ventricular hypertrophy Baseline wander in lead(s) V4 Confirmed by Aletta Edouard [id number]) on 11/06/2017 1:30:14 PM       Radiology Dg Chest 2 View  Result Date: 11/06/2017 CLINICAL DATA:  Lethargic and confused for several days. EXAM: CHEST  2 VIEW COMPARISON:  04/28/2017. FINDINGS: The heart size and mediastinal contours are within normal limits. Both lungs are clear. The visualized skeletal structures are unremarkable. Heavily calcified BILATERAL breast implants. IMPRESSION: No active cardiopulmonary disease.  Stable chest. Electronically Signed  By: Staci Righter M.D.   On: 11/06/2017 13:52    Procedures Procedures (including critical care time)  Medications Ordered in ED Medications  meloxicam (MOBIC) tablet 15 mg (not administered)  metoprolol tartrate (LOPRESSOR) tablet 50 mg (not administered)  pantoprazole (PROTONIX) EC tablet 40 mg (not administered)  potassium chloride (K-DUR) CR tablet 10 mEq (not administered)  enoxaparin (LOVENOX) injection 40 mg (not administered)  sodium chloride flush (NS) 0.9 % injection 3 mL (not administered)  sodium chloride flush (NS) 0.9 % injection 3 mL (not administered)  0.9 %  sodium chloride infusion (not administered)  acetaminophen (TYLENOL) tablet 650 mg (not administered)    Or  acetaminophen (TYLENOL) suppository 650 mg (not administered)  hydrALAZINE (APRESOLINE) injection 20 mg (not administered)  losartan (COZAAR) tablet 100 mg (100 mg Oral Not Given 11/06/17 1846)  0.9 %  sodium chloride infusion ( Intravenous New Bag/Given 11/06/17 1854)  citalopram (CELEXA) tablet 20 mg (not administered)  sodium chloride 0.9 % bolus 1,000 mL (0 mLs  Intravenous Stopped 11/06/17 1435)  potassium chloride SA (K-DUR,KLOR-CON) CR tablet 40 mEq (40 mEq Oral Given 11/06/17 1414)     Initial Impression / Assessment and Plan / ED Course  I have reviewed the triage vital signs and the nursing notes.  Pertinent labs & imaging results that were available during my care of the patient were reviewed by me and considered in my medical decision making (see chart for details).  Clinical Course as of Nov 06 1942  Wed Nov 06, 2017  1524 patient demonstrate a low sodium potassium again.  Got some IV fluids: And ordered potassium repletion.  I called her primary care doctor Dr. Hilma Favors who states this is very unlike the patient although she does not follow-up with very often.  He is concerned that something else going on and may be the patient will benefit from admission.  I have paged the hospitalist.  [MB]  1542 Discussed with hospitalist on-call who Dr. Maudie Mercury who accepts patient for admission.  [MB]    Clinical Course User Index [MB] Hayden Rasmussen, MD    ?new vs worsening confusion and weakness generalized in setting of hypokalemia and hyponatremia. pcp asking for admission for further workup   Final Clinical Impressions(s) / ED Diagnoses   Final diagnoses:  Hypokalemia  Hyponatremia  Confusion    ED Discharge Orders    None       Hayden Rasmussen, MD 11/06/17 1944

## 2017-11-06 NOTE — H&P (Signed)
TRH H&P   Patient Demographics:    Claire West, is a 77 y.o. female  MRN: [id number]   DOB - Mar 03, 1941  Admit Date - 11/06/2017  Outpatient Primary MD for the patient is Sharilyn Sites, MD  Referring MD/NP/PA:  Melina Copa  Outpatient Specialists:    Patient coming from: home  Chief Complaint  Patient presents with  . Altered Mental Status      HPI:    Claire West  is a 77 y.o. female,w possibly early dementia, hypertension, apparently has generalized weakness per her sister.  Her sister notes also slightly increased confusion.  They were in the ER on Monday and noted to have hypokalemia, and hyponatremia. CT brain on Monday 11/04/2017 was negative for acute process.  Pt is still taking hydrochlorothiazide.  Pt was brought to ED due to generalized weakness. There is not change in her confusion, or poor memory today per her sister.   In Ed.   Na 128, K 2.9 Bun 12, Creatinine 0.68 Trop  <0.03 Wbc 9.4, Hgb 12.8, Plt 241 Ammonia 16 Tsh 0.942 UDS pending   CXR negative  Pt will be admitted observation for hyponatremia, hypokalemia, and also generalized weakness and confusion (not new).        Review of systems:    In addition to the HPI above, No Fever-chills, No Headache, No changes with Vision or hearing, No problems swallowing food or Liquids, No Chest pain, Cough or Shortness of Breath, No Abdominal pain, No Nausea or Vommitting, Bowel movements are regular, No Blood in stool or Urine, No dysuria, No new skin rashes or bruises, No new joints pains-aches,  No new weakness, tingling, numbness in any extremity, No recent weight gain or loss, No polyuria, polydypsia or polyphagia, No significant Mental Stressors.  A full 10 point Review of Systems was done, except as stated above, all other Review of Systems were negative.   With Past History of the  following :    Past Medical History:  Diagnosis Date  . Depression   . GERD (gastroesophageal reflux disease)   . Hypertension   . Seasonal allergies       Past Surgical History:  Procedure Laterality Date  . ABDOMINAL HYSTERECTOMY    . ARTHROSCOPIC REPAIR ACL    . COLONOSCOPY    . COLONOSCOPY N/A 11/23/2015   Procedure: COLONOSCOPY;  Surgeon: Rogene Houston, MD;  Location: [facility];  Service: Endoscopy;  Laterality: N/A;  830      Social History:     Social History   Tobacco Use  . Smoking status: Never Smoker  . Smokeless tobacco: Never Used  Substance Use Topics  . Alcohol use: No     Lives - at home w husband  Mobility - walks by self   Family History :     Family History  Problem Relation Age of Onset  .  Lung cancer Mother   . Lung cancer Sister       Home Medications:   Prior to Admission medications   Medication Sig Start Date End Date Taking? Authorizing Provider  Cholecalciferol (VITAMIN D PO) Take 1 capsule by mouth daily.   Yes [provider]  escitalopram (LEXAPRO) 5 MG tablet Take 5 mg by mouth at bedtime.  04/23/15  Yes [provider]  losartan-hydrochlorothiazide (HYZAAR) 100-12.5 MG per tablet Take 1 tablet by mouth daily. 04/16/15  Yes [provider]  meloxicam (MOBIC) 15 MG tablet Take 1 tablet by mouth daily. 01/18/16  Yes [provider]  metoprolol tartrate (LOPRESSOR) 50 MG tablet Take 50 mg by mouth 2 (two) times daily.  11/02/17  Yes [provider]  Multiple Vitamin (MULTIVITAMIN WITH MINERALS) TABS tablet Take 1 tablet by mouth daily.   Yes [provider]  omeprazole (PRILOSEC) 20 MG capsule Take 20 mg by mouth every other day.  04/18/15  Yes [provider]  potassium chloride (K-DUR) 10 MEQ tablet Take 1 tablet (10 mEq total) by mouth daily. 11/04/17  Yes Noemi Chapel, MD     Allergies:     Allergies  Allergen Reactions  . Codeine Hives and Rash  .  Penicillins Rash    Oral rash/peeling Has patient had a PCN reaction causing immediate rash, facial/tongue/throat swelling, SOB or lightheadedness with hypotension: Yes Has patient had a PCN reaction causing severe rash involving mucus membranes or skin necrosis: No Has patient had a PCN reaction that required hospitalization No Has patient had a PCN reaction occurring within the last 10 years: No If all of the above answers are "NO", then may proceed with Cephalosporin use.   . Sulfa Antibiotics Hives and Rash     Physical Exam:   Vitals  Blood pressure (!) 192/84, pulse 79, temperature (!) 97.5 F (36.4 C), resp. rate 13, height _0  (1.676 m), weight 54.4 kg (120 lb), SpO2 100 %.   1. General lying in bed in NAD,   2. Normal affect and insight, Not Suicidal or Homicidal, Awake Alert, Oriented X 2 (person, place), not time.  3. No F.N deficits, ALL C.Nerves Intact, Strength 5/5 all 4 extremities, Sensation intact all 4 extremities, Plantars down going.  4. Ears and Eyes appear Normal, Conjunctivae clear, PERRLA. Moist Oral Mucosa.  5. Supple Neck, No JVD, No cervical lymphadenopathy appriciated, No Carotid Bruits.  6. Symmetrical Chest wall movement, Good air movement bilaterally, CTAB.  7. RRR, No Gallops, Rubs or Murmurs, No Parasternal Heave.  8. Positive Bowel Sounds, Abdomen Soft, No tenderness, No organomegaly appriciated,No rebound -guarding or rigidity.  9.  No Cyanosis, Normal Skin Turgor, No Skin Rash or Bruise.  10. Good muscle tone,  joints appear normal , no effusions, Normal ROM.  11. No Palpable Lymph Nodes in Neck or Axillae     Data Review:    CBC Recent Labs  Lab 11/04/17 1432 11/06/17 1327  WBC 10.0 9.4  HGB 12.4 12.8  HCT 36.0 37.3  PLT 213 241  MCV 93.5 94.2  MCH 32.2 32.3  MCHC 34.4 34.3  RDW 12.1 11.9  LYMPHSABS 1.8 1.8  MONOABS 1.3 1.1*  EOSABS 0.1 0.1  BASOSABS 0.0 0.0    ------------------------------------------------------------------------------------------------------------------  Chemistries  Recent Labs  Lab 11/04/17 1432 11/06/17 1327  NA 128* 128*  K 2.7* 2.9*  CL 91* 91*  CO2 25 26  GLUCOSE 113* 115*  BUN 14 12  CREATININE 0.69 0.68  CALCIUM 8.7* 9.0  AST 25 23  ALT 16 20  ALKPHOS 62 71  BILITOT 0.8 0.5   ------------------------------------------------------------------------------------------------------------------ estimated creatinine clearance is 52.2 mL/min (by C-G formula based on SCr of 0.68 mg/dL). ------------------------------------------------------------------------------------------------------------------ Recent Labs    11/06/17 1329  TSH 0.942    Coagulation profile Recent Labs  Lab 11/04/17 1432  INR 0.94   ------------------------------------------------------------------------------------------------------------------- No results for input(s): DDIMER in the last 72 hours. -------------------------------------------------------------------------------------------------------------------  Cardiac Enzymes Recent Labs  Lab 11/06/17 1327  TROPONINI <0.03   ------------------------------------------------------------------------------------------------------------------ No results found for: BNP   ---------------------------------------------------------------------------------------------------------------  Urinalysis    Component Value Date/Time   COLORURINE STRAW (A) 11/06/2017 1328   APPEARANCEUR CLEAR 11/06/2017 1328   LABSPEC 1.004 (L) 11/06/2017 1328   PHURINE 7.0 11/06/2017 [place] 11/06/2017 [place] 11/06/2017 [place] 11/06/2017 [place] 11/06/2017 1328   PROTEINUR NEGATIVE 11/06/2017 1328   NITRITE NEGATIVE 11/06/2017 1328   LEUKOCYTESUR NEGATIVE 11/06/2017 1328     ----------------------------------------------------------------------------------------------------------------   Imaging Results:    Dg Chest 2 View  Result Date: 11/06/2017 CLINICAL DATA:  Lethargic and confused for several days. EXAM: CHEST  2 VIEW COMPARISON:  04/28/2017. FINDINGS: The heart size and mediastinal contours are within normal limits. Both lungs are clear. The visualized skeletal structures are unremarkable. Heavily calcified BILATERAL breast implants. IMPRESSION: No active cardiopulmonary disease.  Stable chest. Electronically Signed   By: Staci Righter M.D.   On: 11/06/2017 13:52       Assessment & Plan:    Active Problems:   Hyponatremia   HTN (hypertension)   Altered mental status    Hyponatremia Check serum osm, cortisol, tsh Check urine osm, urine sodium Hydrate with ns iv Check cmp in am  Hypokalemia repleted in am Check cmp in am  AMS Check MRI brain Check b12, folate, tsh, esr, ana, rpr Neurology consult in am   ? Early dementia.   Hypertension Cont metoprolol DC losartan /hydrochlorothiazide Start losartan 165m po qday Hydralazine 146miv q6h prn sbp >160  Depression/ anxiety Cont lexapro   H/o Hepatic cyst May need f/u     DVT Prophylaxis Lovenox - SCDs  AM Labs Ordered, also please review Full Orders  Family Communication: Admission, patients condition and plan of care including tests being ordered have been discussed with the patient  who indicate understanding and agree with the plan and Code Status.  Code Status FULL CODE  Likely DC to  home  Condition GUARDED    Consults called:  neurology  Admission status:  observation  Time spent in minutes : 45   JaJani Gravel.D on 11/06/2017 at 3:59 PM  Between 7am to 7pm - Pager - [phone] After 7pm go to [email or web address] - password [email or web address] - Office  [phone]

## 2017-11-07 DIAGNOSIS — R269 Unspecified abnormalities of gait and mobility: Secondary | ICD-10-CM | POA: Diagnosis not present

## 2017-11-07 DIAGNOSIS — I10 Essential (primary) hypertension: Secondary | ICD-10-CM | POA: Diagnosis not present

## 2017-11-07 DIAGNOSIS — G9341 Metabolic encephalopathy: Secondary | ICD-10-CM

## 2017-11-07 DIAGNOSIS — E871 Hypo-osmolality and hyponatremia: Secondary | ICD-10-CM

## 2017-11-07 DIAGNOSIS — E876 Hypokalemia: Secondary | ICD-10-CM | POA: Diagnosis not present

## 2017-11-07 DIAGNOSIS — R4182 Altered mental status, unspecified: Secondary | ICD-10-CM | POA: Diagnosis not present

## 2017-11-07 HISTORY — DX: Metabolic encephalopathy: G93.41

## 2017-11-07 LAB — CBC
HEMATOCRIT: 31.8 % — AB (ref 36.0–46.0)
HEMOGLOBIN: 10.9 g/dL — AB (ref 12.0–15.0)
MCH: 32.5 pg (ref 26.0–34.0)
MCHC: 34.3 g/dL (ref 30.0–36.0)
MCV: 94.9 fL (ref 78.0–100.0)
Platelets: 157 10*3/uL (ref 150–400)
RBC: 3.35 MIL/uL — AB (ref 3.87–5.11)
RDW: 12.2 % (ref 11.5–15.5)
WBC: 8.8 10*3/uL (ref 4.0–10.5)

## 2017-11-07 LAB — FOLATE RBC
FOLATE, RBC: 1362 ng/mL (ref >498)
Folate, Hemolysate: 471.3 ng/mL
HEMATOCRIT: 34.6 % (ref 34.0–46.6)

## 2017-11-07 LAB — RPR: RPR: NONREACTIVE

## 2017-11-07 LAB — COMPREHENSIVE METABOLIC PANEL
ALT: 17 U/L (ref 14–54)
AST: 23 U/L (ref 15–41)
Albumin: 2.9 g/dL — ABNORMAL LOW (ref 3.5–5.0)
Alkaline Phosphatase: 59 U/L (ref 38–126)
Anion gap: 8 (ref 5–15)
BUN: 9 mg/dL (ref 6–20)
CHLORIDE: 101 mmol/L (ref 101–111)
CO2: 24 mmol/L (ref 22–32)
Calcium: 8.4 mg/dL — ABNORMAL LOW (ref 8.9–10.3)
Creatinine, Ser: 0.6 mg/dL (ref 0.44–1.00)
Glucose, Bld: 104 mg/dL — ABNORMAL HIGH (ref 65–99)
POTASSIUM: 4.2 mmol/L (ref 3.5–5.1)
Sodium: 133 mmol/L — ABNORMAL LOW (ref 135–145)
Total Bilirubin: 0.3 mg/dL (ref 0.3–1.2)
Total Protein: 5.2 g/dL — ABNORMAL LOW (ref 6.5–8.1)

## 2017-11-07 LAB — MAGNESIUM: Magnesium: 1.7 mg/dL (ref 1.7–2.4)

## 2017-11-07 LAB — ANTINUCLEAR ANTIBODIES, IFA: ANTINUCLEAR ANTIBODIES, IFA: NEGATIVE

## 2017-11-07 MED ORDER — POTASSIUM CHLORIDE IN NACL 20-0.9 MEQ/L-% IV SOLN
INTRAVENOUS | Status: AC
  Administered 2017-11-07: 09:00:00 via INTRAVENOUS

## 2017-11-07 MED ORDER — LOSARTAN POTASSIUM 50 MG PO TABS
50.0000 mg | ORAL_TABLET | Freq: Every day | ORAL | Status: DC
  Administered 2017-11-07 – 2017-11-08 (×2): 50 mg via ORAL
  Filled 2017-11-07 (×2): qty 1

## 2017-11-07 MED ORDER — MAGNESIUM SULFATE 2 GM/50ML IV SOLN
2.0000 g | Freq: Once | INTRAVENOUS | Status: AC
  Administered 2017-11-07: 2 g via INTRAVENOUS
  Filled 2017-11-07: qty 50

## 2017-11-07 NOTE — Care Management Obs Status (Signed)
[place] NOTIFICATION   Patient Details  Name: Claire West MRN: [id number] Date of Birth: 1941-07-27   Medicare Observation Status Notification Given:  Yes    [name], RN 11/07/2017, 9:18 AM

## 2017-11-07 NOTE — Plan of Care (Signed)
  Acute Rehab PT Goals(only PT should resolve) Pt Will Go Supine/Side To Sit 11/07/2017 1444 - Progressing by Lonell Grandchild, PT Flowsheets Taken 11/07/2017 1444  Pt will go Supine/Side to Sit Independently Patient Will Transfer Sit To/From Stand 11/07/2017 1444 - Progressing by Lonell Grandchild, PT Flowsheets Taken 11/07/2017 1444  Patient will transfer sit to/from stand with modified independence Pt Will Transfer Bed To Chair/Chair To Bed 11/07/2017 1444 - Progressing by Lonell Grandchild, PT Flowsheets Taken 11/07/2017 1444  Pt will Transfer Bed to Chair/Chair to Bed with modified independence Pt Will Ambulate 11/07/2017 1444 - Progressing by Lonell Grandchild, PT Flowsheets Taken 11/07/2017 1444  Pt will Ambulate 100 feet;with modified independence;with cane  2:44 PM, 11/07/17 Lonell Grandchild, MPT Physical Therapist with [facility] [place] 336 [phone] office [phone] mobile phone
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
## 2017-11-07 NOTE — Evaluation (Signed)
Physical Therapy Evaluation Patient Details Name: Claire West MRN: [id number] DOB: [birth year] Today's Date: 11/07/2017   History of Present Illness  Roxi Hlavaty  is a 77 y.o. female,w possibly early dementia, hypertension, apparently has generalized weakness per her sister.  Her sister notes also slightly increased confusion.  They were in the ER on Monday and noted to have hypokalemia, and hyponatremia. CT brain on Monday 11/04/2017 was negative for acute process.     Clinical Impression  Patient functioning near baseline for functional mobility and gait, demonstrates slightly labored cadence without loss of balance during gait training using SPC.  Patient will benefit from continued physical therapy in hospital and recommended venue below to increase strength, balance, endurance for safe ADLs and gait.    Follow Up Recommendations Home health PT;Supervision - Intermittent    Equipment Recommendations  None recommended by PT    Recommendations for Other Services       Precautions / Restrictions Precautions Precautions: Fall Restrictions Weight Bearing Restrictions: No      Mobility  Bed Mobility Overal bed mobility: Needs Assistance Bed Mobility: Supine to Sit     Supine to sit: Supervision        Transfers Overall transfer level: Needs assistance Equipment used: Straight cane Transfers: Sit to/from Stand;Stand Pivot Transfers Sit to Stand: Supervision Stand pivot transfers: Supervision          Ambulation/Gait Ambulation/Gait assistance: Supervision Ambulation Distance (Feet): 75 Feet Assistive device: Straight cane Gait Pattern/deviations: WFL(Within Functional Limits);Step-through pattern   Gait velocity interpretation: Below normal speed for age/gender General Gait Details: grossly WFL other than slightly labored slow cadence with occasional 3 point gait pattern , no loss of balance  Stairs            Wheelchair Mobility    Modified  Rankin (Stroke Patients Only)       Balance Overall balance assessment: Needs assistance Sitting-balance support: No upper extremity supported;Feet supported Sitting balance-Leahy Scale: Good     Standing balance support: Single extremity supported;During functional activity Standing balance-Leahy Scale: Fair Standing balance comment: fair/good with SPC                             Pertinent Vitals/Pain Pain Assessment: No/denies pain    Home Living Family/patient expects to be discharged to:: Private residence Living Arrangements: Alone Available Help at Discharge: Family(sister) Type of Home: House Home Access: Stairs to enter Entrance Stairs-Rails: Right;Left;Can reach both Entrance Stairs-Number of Steps: 3-4 Home Layout: Two level(her bedroom on 2nd floor, but can stay on 1st floor) Home Equipment: Cane - single point;Walker - 2 wheels;Shower seat;Bedside commode;Wheelchair - manual      Prior Function Level of Independence: Independent with assistive device(s)         Comments: Community ambulator independent most of time, uses SPC PRN     Hand Dominance        Extremity/Trunk Assessment   Upper Extremity Assessment Upper Extremity Assessment: Generalized weakness    Lower Extremity Assessment Lower Extremity Assessment: Generalized weakness    Cervical / Trunk Assessment Cervical / Trunk Assessment: Normal  Communication   Communication: No difficulties;Expressive difficulties(occasional pauses before making statements)  Cognition Arousal/Alertness: Awake/alert Behavior During Therapy: WFL for tasks assessed/performed Overall Cognitive Status: Within Functional Limits for tasks assessed  General Comments      Exercises     Assessment/Plan    PT Assessment Patient needs continued PT services  PT Problem List Decreased strength;Decreased activity tolerance;Decreased  balance;Decreased mobility       PT Treatment Interventions Gait training;Stair training;Therapeutic activities;Therapeutic exercise;Patient/family education    PT Goals (Current goals can be found in the Care Plan section)  Acute Rehab PT Goals Patient Stated Goal: return home PT Goal Formulation: With patient Time For Goal Achievement: 11/11/17 Potential to Achieve Goals: Good    Frequency Min 3X/week   Barriers to discharge        Co-evaluation               AM-PAC PT "6 Clicks" Daily Activity  Outcome Measure Difficulty turning over in bed (including adjusting bedclothes, sheets and blankets)?: None Difficulty moving from lying on back to sitting on the side of the bed? : None Difficulty sitting down on and standing up from a chair with arms (e.g., wheelchair, bedside commode, etc,.)?: None Help needed moving to and from a bed to chair (including a wheelchair)?: A Little Help needed walking in hospital room?: A Little Help needed climbing 3-5 steps with a railing? : A Little 6 Click Score: 21    End of Session   Activity Tolerance: Patient tolerated treatment well Patient left: in chair;with call bell/phone within reach Nurse Communication: Mobility status;Other (comment)(RN notified patient left up in chair) PT Visit Diagnosis: Unsteadiness on feet (R26.81);Other abnormalities of gait and mobility (R26.89);Muscle weakness (generalized) (M62.81)    Time: 4081-4481 PT Time Calculation (min) (ACUTE ONLY): 24 min   Charges:   PT Evaluation $PT Eval Low Complexity: 1 Low PT Treatments $Therapeutic Activity: 23-37 mins   PT G Codes:        2:42 PM, 11/26/17 Lonell Grandchild, MPT Physical Therapist with [facility] [phone] office [phone] mobile phone

## 2017-11-07 NOTE — Care Management Note (Addendum)
Case Management Note  Patient Details  Name: Claire West MRN: [id number] Date of Birth: [birth year]  Subjective/Objective:      Observation for AMS. Pt is alert this AM, sitting up in bed. Pt is oriented to person and place. She tells CM she lives home, alone, that she drives. She is married and has Husband that lives at [employer]. Pt says she has had [place] services in the past, can not remember name of HHA. She uses cane with ambulation, is ind with ADL's and says she "has no problem managing her home".               Action/Plan: Pt is planning to return home with self care. CM anticipate pt would not qualify nor be agreeable to [facility] at [place]. MD has discussed plan with pt sister CM attempted contact but sister did not answer, CM did not leave VM. Will attempt to call later today.  Expected Discharge Date:      11/08/17            Expected Discharge Plan:  Home/Self Care  In-House Referral:  NA  Discharge planning Services  CM Consult  Post Acute Care Choice:    Choice offered to:     DME Arranged:    DME Agency:     HH Arranged:    HH Agency:     Status of Service:  In process, will continue to follow  If discussed at Long Length of Stay Meetings, dates discussed:    Additional Comments:  1245 Addendum: Claire West (pt's sister) returned call. Says pt is in denial about her dementia. Dr. Maudie Mercury recommended they bring her to the hospital for placement into [facility] to give sister and son time to make long term plans. Pt's husband is in memory unit at [facility], has long term insurance paying. Pt also has a long term plan. CM discussed that pt's medicare will not pay for SNF. Per PT, pt does is not deconditioned to the point of needing SNF. Pt also under observation. Pt's sister voices understanding. CM will call pt's son to disucss using long term are policy.   1330 Addendum: CM spoke with pt's son Claire West) who says they are looking into long term care for his mom. She forgets to eat, to  take medications, etc. CM discussed requirements for SNF with son. He verbalizes understanding about observation status and what consittutes skilled needs. HH has recommended HH PT, pt ambulated 75 ft. Neurologist has recommended SNF but did not mention what killed need she would have to requires that STR. Pt's son asks that we check to see if [facility] has a bed and would be willing to take her until LTC could be arranged. He understands medicare will not pay, he is not concerned about weather or not pt's long term care plan would reimburse for stay. CM has placed a CSW consult to determine if [facility] has beds available either on skilled unit or in ALF side. CSW to f/u with son. Son states their back up plan is to take pt home and sister will stay as much as possible until son can get back into town.    [name], RN 11/07/2017, 10:55 AM

## 2017-11-07 NOTE — Consult Note (Signed)
[place] A. Merlene Laughter, MD     [email or web address]          Claire West is an 77 y.o. female.   ASSESSMENT/PLAN: 1. Global weakness, fatigue and cognitive impairment likely due Hypokalemia and hyponatremia. This should improve. I agree with the current care of rehydration and replacement of electrolytes. Physical and occupational therapies are recommended. Skilled/rehabilitation facility for the next 2-3 weeks is recommended. The patient should follow up in the office with [place] in about 6 weeks to assess improvement and determine if any other treatment is warranted.   2. Baseline mild cognitive impairment: This will be followed for now.     The patient is a 77 year old white female who presents with a global fatigue and weakness over the last several days. The patient's sister is in the exam room and also provides history. The sister reports that the patient has had mild memory problems over the last 2 months but this has gotten a lot worse over the last 2 weeks. She seems to be forgetting her appointments and were to go. She currently lives by herself at home. She typically does her ADLs and drives around to do her activities but over the past week or 2 the family has not felt comfortable with her doing this. Admission labs shows evidence of significant hypokalemia of 2.9 and also hyponatremia. She has been in to the hospital and the place on replacement of electrolytes and also IV hydration. Neurological consultation has been sought to help with evaluation. They do not report the patient having focal weakness, numbness, chest pain or shortness of breath. There is no evidence of dysarthria or or dysphasia. The review of systems otherwise negative.  GENERAL: This a very pleasant thin female who is in no acute distress.  HEENT: Supple. Atraumatic normocephalic.   ABDOMEN: soft  EXTREMITIES: No edema   BACK: Normal.  SKIN: Normal by inspection.    MENTAL STATUS:  Alert and oriented including hospital, month and year. Speech, language and cognition are generally intact. Judgment and insight normal.   CRANIAL NERVES: Pupils are equal, round and reactive to light and accommodation; extra ocular movements are full, there is no significant nystagmus; visual fields are full; upper and lower facial muscles are normal in strength and symmetric, there is no flattening of the nasolabial folds; tongue is midline; uvula is midline; shoulder elevation is normal.  MOTOR: Normal tone, bulk and strength; no pronator drift.  COORDINATION: Left finger to nose is normal, right finger to nose is normal, No rest tremor; no intention tremor; no postural tremor; no bradykinesia.  REFLEXES: Deep tendon reflexes are symmetrical and normal. Babinski reflexes are flexor bilaterally.   SENSATION: Normal to light touch.      Blood pressure (!) 183/90, pulse 74, temperature 98.2 F (36.8 C), temperature source Oral, resp. rate 16, height '5\' 6"'$  (1.676 m), weight 121 lb 6.4 oz (55.1 kg), SpO2 99 %.  Past Medical History:  Diagnosis Date  . Depression   . GERD (gastroesophageal reflux disease)   . Hypertension   . Seasonal allergies     Past Surgical History:  Procedure Laterality Date  . ABDOMINAL HYSTERECTOMY    . ARTHROSCOPIC REPAIR ACL    . COLONOSCOPY    . COLONOSCOPY N/A 11/23/2015   Procedure: COLONOSCOPY;  Surgeon: Rogene [place], MD;  Location: [facility];  Service: Endoscopy;  Laterality: N/A;  14    Family History  Problem Relation Age of Onset  . Lung  cancer Mother   . Lung cancer Sister     Social History:  reports that  has never smoked. she has never used smokeless tobacco. She reports that she does not drink alcohol or use drugs.  Allergies:  Allergies  Allergen Reactions  . Codeine Hives and Rash  . Penicillins Rash    Oral rash/peeling Has patient had a PCN reaction causing immediate rash, facial/tongue/throat swelling, SOB or  lightheadedness with hypotension: Yes Has patient had a PCN reaction causing severe rash involving mucus membranes or skin necrosis: No Has patient had a PCN reaction that required hospitalization No Has patient had a PCN reaction occurring within the last 10 years: No If all of the above answers are "NO", then may proceed with Cephalosporin use.   . Sulfa Antibiotics Hives and Rash    Medications: Prior to Admission medications   Medication Sig Start Date End Date Taking? Authorizing Provider  Cholecalciferol (VITAMIN D PO) Take 1 capsule by mouth daily.   Yes [provider]  escitalopram (LEXAPRO) 5 MG tablet Take 5 mg by mouth at bedtime.  04/23/15  Yes [provider]  losartan-hydrochlorothiazide (HYZAAR) 100-12.5 MG per tablet Take 1 tablet by mouth daily. 04/16/15  Yes [provider]  meloxicam (MOBIC) 15 MG tablet Take 1 tablet by mouth daily. 01/18/16  Yes [provider]  metoprolol tartrate (LOPRESSOR) 50 MG tablet Take 50 mg by mouth 2 (two) times daily.  11/02/17  Yes [provider]  Multiple Vitamin (MULTIVITAMIN WITH MINERALS) TABS tablet Take 1 tablet by mouth daily.   Yes [provider]  omeprazole (PRILOSEC) 20 MG capsule Take 20 mg by mouth every other day.  04/18/15  Yes [provider]  potassium chloride (K-DUR) 10 MEQ tablet Take 1 tablet (10 mEq total) by mouth daily. 11/04/17  Yes Noemi Chapel, MD    Scheduled Meds: . citalopram  20 mg Oral QHS  . enoxaparin (LOVENOX) injection  40 mg Subcutaneous Q24H  . losartan  50 mg Oral Daily  . meloxicam  15 mg Oral Daily  . metoprolol tartrate  50 mg Oral BID  . pantoprazole  40 mg Oral Daily  . sodium chloride flush  3 mL Intravenous Q12H   Continuous Infusions: . sodium chloride    . 0.9 % NaCl with KCl 20 mEq / L 75 mL/hr at 11/07/17 0909   PRN Meds:.sodium chloride, acetaminophen **OR** acetaminophen, hydrALAZINE, sodium chloride flush     Results  for orders placed or performed during the hospital encounter of 11/06/17 (from the past 48 hour(s))  Comprehensive metabolic panel     Status: Abnormal   Collection Time: 11/06/17  1:27 PM  Result Value Ref Range   Sodium 128 (L) 135 - 145 mmol/L   Potassium 2.9 (L) 3.5 - 5.1 mmol/L   Chloride 91 (L) 101 - 111 mmol/L   CO2 26 22 - 32 mmol/L   Glucose, Bld 115 (H) 65 - 99 mg/dL   BUN 12 6 - 20 mg/dL   Creatinine, Ser 0.68 0.44 - 1.00 mg/dL   Calcium 9.0 8.9 - 10.3 mg/dL   Total Protein 6.8 6.5 - 8.1 g/dL   Albumin 3.8 3.5 - 5.0 g/dL   AST 23 15 - 41 U/L   ALT 20 14 - 54 U/L   Alkaline Phosphatase 71 38 - 126 U/L   Total Bilirubin 0.5 0.3 - 1.2 mg/dL   GFR calc non Af Amer >60 >60 mL/min   GFR calc  Af Amer >60 >60 mL/min    Comment: (NOTE) The eGFR has been calculated using the CKD EPI equation. This calculation has not been validated in all clinical situations. eGFR's persistently <60 mL/min signify possible Chronic Kidney Disease.    Anion gap 11 5 - 15  Troponin I     Status: None   Collection Time: 11/06/17  1:27 PM  Result Value Ref Range   Troponin I <0.03 <0.03 ng/mL  CBC with Differential     Status: Abnormal   Collection Time: 11/06/17  1:27 PM  Result Value Ref Range   WBC 9.4 4.0 - 10.5 K/uL   RBC 3.96 3.87 - 5.11 MIL/uL   Hemoglobin 12.8 12.0 - 15.0 g/dL   HCT 37.3 36.0 - 46.0 %   MCV 94.2 78.0 - 100.0 fL   MCH 32.3 26.0 - 34.0 pg   MCHC 34.3 30.0 - 36.0 g/dL   RDW 11.9 11.5 - 15.5 %   Platelets 241 150 - 400 K/uL   Neutrophils Relative % 68 %   Lymphocytes Relative 19 %   Monocytes Relative 12 %   Eosinophils Relative 1 %   Basophils Relative 0 %   Neutro Abs 6.4 1.7 - 7.7 K/uL   Lymphs Abs 1.8 0.7 - 4.0 K/uL   Monocytes Absolute 1.1 (H) 0.1 - 1.0 K/uL   Eosinophils Absolute 0.1 0.0 - 0.7 K/uL   Basophils Absolute 0.0 0.0 - 0.1 K/uL   WBC Morphology ATYPICAL LYMPHOCYTES   Urinalysis, Routine w reflex microscopic     Status: Abnormal   Collection Time:  11/06/17  1:28 PM  Result Value Ref Range   Color, Urine STRAW (A) YELLOW   APPearance CLEAR CLEAR   Specific Gravity, Urine 1.004 (L) 1.005 - 1.030   pH 7.0 5.0 - 8.0   Glucose, UA NEGATIVE NEGATIVE mg/dL   Hgb urine dipstick NEGATIVE NEGATIVE   Bilirubin Urine NEGATIVE NEGATIVE   Ketones, ur NEGATIVE NEGATIVE mg/dL   Protein, ur NEGATIVE NEGATIVE mg/dL   Nitrite NEGATIVE NEGATIVE   Leukocytes, UA NEGATIVE NEGATIVE  Ammonia     Status: None   Collection Time: 11/06/17  1:29 PM  Result Value Ref Range   Ammonia 16 9 - 35 umol/L  TSH     Status: None   Collection Time: 11/06/17  1:29 PM  Result Value Ref Range   TSH 0.942 0.350 - 4.500 uIU/mL    Comment: Performed by a 3rd Generation assay with a functional sensitivity of <=0.01 uIU/mL.  Osmolality, urine     Status: Abnormal   Collection Time: 11/06/17  5:20 PM  Result Value Ref Range   Osmolality, Ur 230 (L) 300 - 900 mOsm/kg    Comment: Performed at [facility] [address] [place], [place] [address]  Sodium, urine, random     Status: None   Collection Time: 11/06/17  5:28 PM  Result Value Ref Range   Sodium, Ur 57 mmol/L  Vitamin B12     Status: Abnormal   Collection Time: 11/06/17  5:38 PM  Result Value Ref Range   Vitamin B-12 1,006 (H) 180 - 914 pg/mL    Comment: (NOTE) This assay is not validated for testing neonatal or myeloproliferative syndrome specimens for Vitamin B12 levels. Performed at [facility], [place] [address] [place], [place] [address]   Sedimentation rate     Status: None   Collection Time: 11/06/17  5:38 PM  Result Value Ref Range  Sed Rate 10 0 - 22 mm/hr  RPR     Status: None   Collection Time: 11/06/17  5:38 PM  Result Value Ref Range   RPR Ser Ql Non Reactive Non Reactive    Comment: (NOTE) Performed At: [facility] [place], [place] [id number] Rush Farmer MD AS:[id number]   Cortisol     Status: None   Collection Time: 11/06/17  5:38 PM    Result Value Ref Range   Cortisol, Plasma 3.0 ug/dL    Comment: (NOTE) AM    6.7 - 22.6 ug/dL PM   <10.0       ug/dL Performed at [facility] [address], [place], [place] [address]   Osmolality     Status: None   Collection Time: 11/06/17  5:38 PM  Result Value Ref Range   Osmolality 280 275 - 295 mOsm/kg    Comment: Performed at [facility], [place] [address], [place], [place] [address]  Urine rapid drug screen (hosp performed)     Status: None   Collection Time: 11/06/17  6:00 PM  Result Value Ref Range   Opiates NONE DETECTED NONE DETECTED   Cocaine NONE DETECTED NONE DETECTED   Benzodiazepines NONE DETECTED NONE DETECTED   Amphetamines NONE DETECTED NONE DETECTED   Tetrahydrocannabinol NONE DETECTED NONE DETECTED   Barbiturates NONE DETECTED NONE DETECTED    Comment: (NOTE) DRUG SCREEN FOR MEDICAL PURPOSES ONLY.  IF CONFIRMATION IS NEEDED FOR ANY PURPOSE, NOTIFY LAB WITHIN 5 DAYS. LOWEST DETECTABLE LIMITS FOR URINE DRUG SCREEN Drug Class                     Cutoff (ng/mL) Amphetamine and metabolites    1000 Barbiturate and metabolites    200 Benzodiazepine                 408 Tricyclics and metabolites     300 Opiates and metabolites        300 Cocaine and metabolites        300 THC                            50   Comprehensive metabolic panel     Status: Abnormal   Collection Time: 11/07/17  4:54 AM  Result Value Ref Range   Sodium 133 (L) 135 - 145 mmol/L   Potassium 4.2 3.5 - 5.1 mmol/L    Comment: DELTA CHECK NOTED   Chloride 101 101 - 111 mmol/L   CO2 24 22 - 32 mmol/L   Glucose, Bld 104 (H) 65 - 99 mg/dL   BUN 9 6 - 20 mg/dL   Creatinine, Ser 0.60 0.44 - 1.00 mg/dL   Calcium 8.4 (L) 8.9 - 10.3 mg/dL   Total Protein 5.2 (L) 6.5 - 8.1 g/dL   Albumin 2.9 (L) 3.5 - 5.0 g/dL   AST 23 15 - 41 U/L   ALT 17 14 - 54 U/L   Alkaline Phosphatase 59 38 - 126 U/L   Total Bilirubin 0.3 0.3 - 1.2 mg/dL   GFR calc non Af Amer >60 >60 mL/min   GFR  calc Af Amer >60 >60 mL/min    Comment: (NOTE) The eGFR has been calculated using the CKD EPI equation. This calculation has not been validated in all clinical situations. eGFR's persistently <60 mL/min signify possible Chronic Kidney Disease.    Anion gap 8 5 -  15  CBC     Status: Abnormal   Collection Time: 11/07/17  4:54 AM  Result Value Ref Range   WBC 8.8 4.0 - 10.5 K/uL   RBC 3.35 (L) 3.87 - 5.11 MIL/uL   Hemoglobin 10.9 (L) 12.0 - 15.0 g/dL   HCT 31.8 (L) 36.0 - 46.0 %   MCV 94.9 78.0 - 100.0 fL   MCH 32.5 26.0 - 34.0 pg   MCHC 34.3 30.0 - 36.0 g/dL   RDW 12.2 11.5 - 15.5 %   Platelets 157 150 - 400 K/uL  Magnesium     Status: None   Collection Time: 11/07/17  9:43 AM  Result Value Ref Range   Magnesium 1.7 1.7 - 2.4 mg/dL    Studies/Results:     MRI BRAIN FINDINGS: Brain: Generalized age-related cerebral atrophy. Extensive T2/FLAIR hyperintensity involving the periventricular and deep white matter both cerebral hemispheres, most consistent with chronic microvascular ischemic changes. Chronic microvascular disease present within the pons as well. Superimposed remote lacunar infarcts present within the bilateral basal ganglia, deep white matter, and right thalamus.  No abnormal foci of restricted diffusion to suggest acute or subacute ischemia. Gray-white matter differentiation maintained. No evidence for remote cortical infarction. No acute intracranial hemorrhage. Small chronic microhemorrhage noted within the right thalamus.  No mass lesion, midline shift or mass effect. No hydrocephalus. No extra-axial fluid collection. Major dural sinuses are grossly patent.  Pituitary gland suprasellar region normal. Midline structures intact and normal.  Vascular: Major intracranial vascular flow voids are maintained at the skull base.  Skull and upper cervical spine: Craniocervical junction within normal limits. Mild degenerate spondylolysis noted within  the upper cervical spine without significant stenosis. Bone marrow signal intensity within normal limits. No scalp soft tissue abnormality.  Sinuses/Orbits: Globes and orbital soft tissues within normal limits. Patient status post cataract extraction bilaterally. Scattered mucosal thickening within the ethmoidal air cells and maxillary sinuses. Paranasal sinuses are otherwise clear. No significant mastoid effusion. Inner ear structures grossly normal.  Other: None.  IMPRESSION: 1. No acute intracranial abnormality. 2. Generalized age-related cerebral atrophy with advanced chronic microvascular ischemic disease with scattered remote lacunar infarcts as above.      THE BRAIN MRI IS REVIEWED IN PERSON.   There is no acute changes seen on DWI.  SWI is unrevealing for hemorrhage.  There is severe confluent and deep white matter leukoencephalopathy bilaterally and involving the periventricular region.  There is also some signal seen in the pontine region.  The reduced signal is seen on T1 suggestive of lacunar infarcts particularly involving the right side   Thalamic region.  There also a couple of smaller regions involving the basal ganglia bilaterally and the left pontine region.   Christophere [place] A. Merlene Laughter, M.D.  Diplomate, Tax adviser of Psychiatry and Neurology ( Neurology). 11/07/2017, 11:53 AM

## 2017-11-08 ENCOUNTER — Encounter: Payer: Self-pay | Admitting: Neurology

## 2017-11-08 ENCOUNTER — Encounter: Payer: Self-pay | Admitting: Psychology

## 2017-11-08 DIAGNOSIS — G9341 Metabolic encephalopathy: Secondary | ICD-10-CM | POA: Diagnosis not present

## 2017-11-08 DIAGNOSIS — E876 Hypokalemia: Secondary | ICD-10-CM | POA: Diagnosis not present

## 2017-11-08 DIAGNOSIS — E871 Hypo-osmolality and hyponatremia: Secondary | ICD-10-CM | POA: Diagnosis not present

## 2017-11-08 DIAGNOSIS — I10 Essential (primary) hypertension: Secondary | ICD-10-CM | POA: Diagnosis not present

## 2017-11-08 LAB — BASIC METABOLIC PANEL
Anion gap: 8 (ref 5–15)
BUN: 8 mg/dL (ref 6–20)
CO2: 25 mmol/L (ref 22–32)
CREATININE: 0.56 mg/dL (ref 0.44–1.00)
Calcium: 8.3 mg/dL — ABNORMAL LOW (ref 8.9–10.3)
Chloride: 100 mmol/L — ABNORMAL LOW (ref 101–111)
GFR calc Af Amer: >60 mL/min (ref >60)
Glucose, Bld: 96 mg/dL (ref 65–99)
Potassium: 3.7 mmol/L (ref 3.5–5.1)
SODIUM: 133 mmol/L — AB (ref 135–145)

## 2017-11-08 LAB — URINE CULTURE

## 2017-11-08 LAB — RPR: RPR: NONREACTIVE

## 2017-11-08 LAB — CORTISOL-AM, BLOOD: CORTISOL - AM: 1.4 ug/dL — AB (ref 6.7–22.6)

## 2017-11-08 LAB — MAGNESIUM: Magnesium: 2.2 mg/dL (ref 1.7–2.4)

## 2017-11-08 LAB — HIV ANTIBODY (ROUTINE TESTING W REFLEX): HIV SCREEN 4TH GENERATION: NONREACTIVE

## 2017-11-08 MED ORDER — LOSARTAN POTASSIUM 100 MG PO TABS: 100.0000 mg | ORAL_TABLET | Freq: Every day | ORAL | 1 refills | Status: DC

## 2017-11-08 MED ORDER — LOSARTAN POTASSIUM 100 MG PO TABS: 50.0000 mg | ORAL_TABLET | Freq: Every day | ORAL | 1 refills | Status: DC

## 2017-11-08 NOTE — Progress Notes (Signed)
I have supervised the discharge of Ms Sanzo by Lynelle Smoke RN

## 2017-11-08 NOTE — Care Management Note (Signed)
Case Management Note  Patient Details  Name: SANTOS HARDWICK MRN: [id number] Date of Birth: 07-16-1941   Expected Discharge Date:     11/08/2017             Expected Discharge Plan:  [place]  In-House Referral:  NA, Clinical Social Work  Discharge planning Services  CM Consult  Post Acute Care Choice:  Home Health Choice offered to:  Patient, Adult Children  DME Arranged:    DME Agency:     HH Arranged:  PT [place]:  [facility]  Status of Service:  Completed, signed off  If discussed at [place] of Stay Meetings, dates discussed:    Additional Comments: Patient discharging home today. Recommended for PT. Family agreeable and has no preference. Juliann Pulse of [facility] notified and will obtain orders from [place]. No other CM needs. Sister will be with patient 24/7. Son will be home over the weekend.  Kagen Kunath, Chauncey [place], RN 11/08/2017, 12:01 PM

## 2017-11-08 NOTE — Progress Notes (Signed)
Discharge instructions read to patient and family.  Patient and family verbalized understanding. Patient discharged home with sister.
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
## 2017-11-08 NOTE — Clinical Social Work Note (Addendum)
LCSW spoke with patient's son, [name], and discussed that patient does not have a skilled need and her PT evaluation recommended HH. He stated that patient's sister would stay with her until he arrives in town on Sunday and begins making long term plans.   LCSW signing off.    Ernest Orr, Clydene Pugh, LCSW

## 2017-11-12 DIAGNOSIS — R531 Weakness: Secondary | ICD-10-CM | POA: Diagnosis not present

## 2017-11-12 DIAGNOSIS — Z1389 Encounter for screening for other disorder: Secondary | ICD-10-CM | POA: Diagnosis not present

## 2017-11-12 DIAGNOSIS — E876 Hypokalemia: Secondary | ICD-10-CM | POA: Diagnosis not present

## 2017-11-12 DIAGNOSIS — F329 Major depressive disorder, single episode, unspecified: Secondary | ICD-10-CM | POA: Diagnosis not present

## 2017-11-12 DIAGNOSIS — Z6821 Body mass index (BMI) 21.0-21.9, adult: Secondary | ICD-10-CM | POA: Diagnosis not present

## 2017-11-12 DIAGNOSIS — E871 Hypo-osmolality and hyponatremia: Secondary | ICD-10-CM | POA: Diagnosis not present

## 2017-11-12 DIAGNOSIS — I10 Essential (primary) hypertension: Secondary | ICD-10-CM | POA: Diagnosis not present

## 2017-11-12 DIAGNOSIS — K219 Gastro-esophageal reflux disease without esophagitis: Secondary | ICD-10-CM | POA: Diagnosis not present

## 2017-11-12 DIAGNOSIS — F0390 Unspecified dementia without behavioral disturbance: Secondary | ICD-10-CM | POA: Diagnosis not present

## 2017-11-12 DIAGNOSIS — G9341 Metabolic encephalopathy: Secondary | ICD-10-CM | POA: Diagnosis not present

## 2017-11-12 DIAGNOSIS — G3184 Mild cognitive impairment, so stated: Secondary | ICD-10-CM | POA: Diagnosis not present

## 2017-11-14 DIAGNOSIS — I10 Essential (primary) hypertension: Secondary | ICD-10-CM | POA: Diagnosis not present

## 2017-11-14 DIAGNOSIS — R531 Weakness: Secondary | ICD-10-CM | POA: Diagnosis not present

## 2017-11-14 DIAGNOSIS — E876 Hypokalemia: Secondary | ICD-10-CM | POA: Diagnosis not present

## 2017-11-14 DIAGNOSIS — E871 Hypo-osmolality and hyponatremia: Secondary | ICD-10-CM | POA: Diagnosis not present

## 2017-11-14 DIAGNOSIS — G9341 Metabolic encephalopathy: Secondary | ICD-10-CM | POA: Diagnosis not present

## 2017-11-14 DIAGNOSIS — G3184 Mild cognitive impairment, so stated: Secondary | ICD-10-CM | POA: Diagnosis not present

## 2017-11-18 DIAGNOSIS — R531 Weakness: Secondary | ICD-10-CM | POA: Diagnosis not present

## 2017-11-18 DIAGNOSIS — E876 Hypokalemia: Secondary | ICD-10-CM | POA: Diagnosis not present

## 2017-11-18 DIAGNOSIS — G9341 Metabolic encephalopathy: Secondary | ICD-10-CM | POA: Diagnosis not present

## 2017-11-18 DIAGNOSIS — E871 Hypo-osmolality and hyponatremia: Secondary | ICD-10-CM | POA: Diagnosis not present

## 2017-11-18 DIAGNOSIS — I10 Essential (primary) hypertension: Secondary | ICD-10-CM | POA: Diagnosis not present

## 2017-11-18 DIAGNOSIS — G3184 Mild cognitive impairment, so stated: Secondary | ICD-10-CM | POA: Diagnosis not present

## 2017-11-20 DIAGNOSIS — G9341 Metabolic encephalopathy: Secondary | ICD-10-CM | POA: Diagnosis not present

## 2017-11-20 DIAGNOSIS — R531 Weakness: Secondary | ICD-10-CM | POA: Diagnosis not present

## 2017-11-20 DIAGNOSIS — G3184 Mild cognitive impairment, so stated: Secondary | ICD-10-CM | POA: Diagnosis not present

## 2017-11-20 DIAGNOSIS — E876 Hypokalemia: Secondary | ICD-10-CM | POA: Diagnosis not present

## 2017-11-20 DIAGNOSIS — E871 Hypo-osmolality and hyponatremia: Secondary | ICD-10-CM | POA: Diagnosis not present

## 2017-11-20 DIAGNOSIS — I10 Essential (primary) hypertension: Secondary | ICD-10-CM | POA: Diagnosis not present

## 2017-11-25 DIAGNOSIS — G9341 Metabolic encephalopathy: Secondary | ICD-10-CM | POA: Diagnosis not present

## 2017-11-25 DIAGNOSIS — F0390 Unspecified dementia without behavioral disturbance: Secondary | ICD-10-CM | POA: Diagnosis not present

## 2017-11-25 DIAGNOSIS — Z1389 Encounter for screening for other disorder: Secondary | ICD-10-CM | POA: Diagnosis not present

## 2017-11-25 DIAGNOSIS — E871 Hypo-osmolality and hyponatremia: Secondary | ICD-10-CM | POA: Diagnosis not present

## 2017-11-26 DIAGNOSIS — E876 Hypokalemia: Secondary | ICD-10-CM | POA: Diagnosis not present

## 2017-11-26 DIAGNOSIS — G3184 Mild cognitive impairment, so stated: Secondary | ICD-10-CM | POA: Diagnosis not present

## 2017-11-26 DIAGNOSIS — G9341 Metabolic encephalopathy: Secondary | ICD-10-CM | POA: Diagnosis not present

## 2017-11-26 DIAGNOSIS — R531 Weakness: Secondary | ICD-10-CM | POA: Diagnosis not present

## 2017-11-26 DIAGNOSIS — E871 Hypo-osmolality and hyponatremia: Secondary | ICD-10-CM | POA: Diagnosis not present

## 2017-11-26 DIAGNOSIS — I10 Essential (primary) hypertension: Secondary | ICD-10-CM | POA: Diagnosis not present

## 2017-11-28 DIAGNOSIS — G9341 Metabolic encephalopathy: Secondary | ICD-10-CM | POA: Diagnosis not present

## 2017-11-28 DIAGNOSIS — R531 Weakness: Secondary | ICD-10-CM | POA: Diagnosis not present

## 2017-11-28 DIAGNOSIS — E876 Hypokalemia: Secondary | ICD-10-CM | POA: Diagnosis not present

## 2017-11-28 DIAGNOSIS — I10 Essential (primary) hypertension: Secondary | ICD-10-CM | POA: Diagnosis not present

## 2017-11-28 DIAGNOSIS — G3184 Mild cognitive impairment, so stated: Secondary | ICD-10-CM | POA: Diagnosis not present

## 2017-11-28 DIAGNOSIS — E871 Hypo-osmolality and hyponatremia: Secondary | ICD-10-CM | POA: Diagnosis not present

## 2017-11-29 DIAGNOSIS — M170 Bilateral primary osteoarthritis of knee: Secondary | ICD-10-CM | POA: Diagnosis not present

## 2017-12-03 DIAGNOSIS — G9341 Metabolic encephalopathy: Secondary | ICD-10-CM | POA: Diagnosis not present

## 2017-12-03 DIAGNOSIS — G3184 Mild cognitive impairment, so stated: Secondary | ICD-10-CM | POA: Diagnosis not present

## 2017-12-03 DIAGNOSIS — E876 Hypokalemia: Secondary | ICD-10-CM | POA: Diagnosis not present

## 2017-12-03 DIAGNOSIS — E871 Hypo-osmolality and hyponatremia: Secondary | ICD-10-CM | POA: Diagnosis not present

## 2017-12-03 DIAGNOSIS — I10 Essential (primary) hypertension: Secondary | ICD-10-CM | POA: Diagnosis not present

## 2017-12-03 DIAGNOSIS — R531 Weakness: Secondary | ICD-10-CM | POA: Diagnosis not present

## 2017-12-05 DIAGNOSIS — R531 Weakness: Secondary | ICD-10-CM | POA: Diagnosis not present

## 2017-12-05 DIAGNOSIS — E876 Hypokalemia: Secondary | ICD-10-CM | POA: Diagnosis not present

## 2017-12-05 DIAGNOSIS — G3184 Mild cognitive impairment, so stated: Secondary | ICD-10-CM | POA: Diagnosis not present

## 2017-12-05 DIAGNOSIS — G9341 Metabolic encephalopathy: Secondary | ICD-10-CM | POA: Diagnosis not present

## 2017-12-05 DIAGNOSIS — I10 Essential (primary) hypertension: Secondary | ICD-10-CM | POA: Diagnosis not present

## 2017-12-05 DIAGNOSIS — E871 Hypo-osmolality and hyponatremia: Secondary | ICD-10-CM | POA: Diagnosis not present

## 2017-12-16 DIAGNOSIS — R8290 Unspecified abnormal findings in urine: Secondary | ICD-10-CM | POA: Diagnosis not present

## 2017-12-16 DIAGNOSIS — Z6821 Body mass index (BMI) 21.0-21.9, adult: Secondary | ICD-10-CM | POA: Diagnosis not present

## 2017-12-16 DIAGNOSIS — I10 Essential (primary) hypertension: Secondary | ICD-10-CM | POA: Diagnosis not present

## 2017-12-23 ENCOUNTER — Encounter: Payer: Self-pay | Admitting: Neurology

## 2017-12-24 ENCOUNTER — Ambulatory Visit (INDEPENDENT_AMBULATORY_CARE_PROVIDER_SITE_OTHER): Payer: Medicare Other | Admitting: Neurology

## 2017-12-24 ENCOUNTER — Encounter: Payer: Self-pay | Admitting: Neurology

## 2017-12-24 ENCOUNTER — Encounter (INDEPENDENT_AMBULATORY_CARE_PROVIDER_SITE_OTHER): Payer: Self-pay

## 2017-12-24 VITALS — BP 164/95 | HR 82 | Ht 66.0 in | Wt 127.0 lb

## 2017-12-24 DIAGNOSIS — R410 Disorientation, unspecified: Secondary | ICD-10-CM

## 2017-12-24 DIAGNOSIS — G3184 Mild cognitive impairment, so stated: Secondary | ICD-10-CM

## 2017-12-24 DIAGNOSIS — G9341 Metabolic encephalopathy: Secondary | ICD-10-CM

## 2017-12-24 NOTE — Progress Notes (Signed)
Provider:  Larey Seat, M D  Referring Provider: Sharilyn Sites, MD Primary Care Physician:  Sharilyn Sites, MD  Chief Complaint  Patient presents with  . New Patient (Initial Visit)    pt with her sister and son, rm 68 , had some electrolytes were out of whack in fall of last year, happened again in [place]. was in the hospital and had some medication changes. she was having loss of memory. family states that she had ST and PT and that has helped but she still stuggles with her memory worse at night    HPI:  Claire West is a 77 y.o. female is seen here as a referral from Dr. Hilma Favors for follow up on a metabolic encephalopathy late January and early February 2019, and earlier an episode in November 2018. shehas a history of HTN, osteoarthritis and 2 strokes.  Neurology I have the pleasure of seeing Mrs. Paskil today on 24 December 4540, 76 year old Caucasian right-handed female patient of Dr. Hilma Favors presents after a hospital admission between January 30 and November 08, 2017 when she was diagnosed with acute metabolic encephalopathy hyponatremia, hypokalemia.  It was assumed that blood pressure medications which she has taken for over a decade had caused the electrolyte imbalance.  There were also previous episodes of low potassium in late Ultram 2018 noticed.  I would like to "her current medications which include aspirin, Citrucel, multivitamins, calcium, tramadol, oxybutynin chloride, omeprazole, meloxicam, escitalopram, losartan, hydrochlorothiazide and metoprolol.  She no longer takes meloxicam, tramadol , oxybutynin. She feels better but she still has sundowning, asks more repetitive questions to wards the evening, more forgetful.   She is often frustrated and her family is concerned about her driving.     Review of Systems: Out of a complete 14 system review, the patient complains of only the following symptoms, and all other reviewed systems are negative.  confusion, repetitive  questions, walking unsteadiness. Cane - knee is "Bone on bone"   Social History   Socioeconomic History  . Marital status: Married    Spouse name: Not on file  . Number of children: Not on file  . Years of education: Not on file  . Highest education level: Not on file  Social Needs  . Financial resource strain: Not on file  . Food insecurity - worry: Not on file  . Food insecurity - inability: Not on file  . Transportation needs - medical: Not on file  . Transportation needs - non-medical: Not on file  Occupational History  . Not on file  Tobacco Use  . Smoking status: Never Smoker  . Smokeless tobacco: Never Used  Substance and Sexual Activity  . Alcohol use: No  . Drug use: No  . Sexual activity: No  Other Topics Concern  . Not on file  Social History Narrative  . Not on file    Family History  Problem Relation Age of Onset  . Lung cancer Mother   . Lung cancer Sister     Past Medical History:  Diagnosis Date  . Depression   . GERD (gastroesophageal reflux disease)   . Hyperlipemia   . Hypertension   . Seasonal allergies     Past Surgical History:  Procedure Laterality Date  . ABDOMINAL HYSTERECTOMY    . ARTHROSCOPIC REPAIR ACL    . COLONOSCOPY    . COLONOSCOPY N/A 11/23/2015   Procedure: COLONOSCOPY;  Surgeon: Rogene Houston, MD;  Location: [facility];  Service: Endoscopy;  Laterality: N/A;  830    Current Outpatient Medications  Medication Sig Dispense Refill  . aspirin EC 81 MG tablet Take 81 mg by mouth daily.    . Cholecalciferol (VITAMIN D PO) Take 1 capsule by mouth daily.    Marland Kitchen escitalopram (LEXAPRO) 5 MG tablet Take 5 mg by mouth at bedtime.     Marland Kitchen losartan (COZAAR) 100 MG tablet Take 1 tablet (100 mg total) by mouth daily. 30 tablet 1  . meloxicam (MOBIC) 15 MG tablet Take 1 tablet by mouth daily.    . metoprolol succinate (TOPROL-XL) 100 MG 24 hr tablet metoprolol succinate ER 100 mg tablet,extended release 24 hr    . metoprolol tartrate  (LOPRESSOR) 100 MG tablet Take 50 mg by mouth daily.     . Multiple Vitamin (MULTIVITAMIN WITH MINERALS) TABS tablet Take 1 tablet by mouth daily.    Marland Kitchen omeprazole (PRILOSEC) 20 MG capsule Take 20 mg by mouth every other day.     . potassium chloride (K-DUR) 10 MEQ tablet Take 1 tablet (10 mEq total) by mouth daily. 5 tablet 0  . valsartan (DIOVAN) 320 MG tablet      No current facility-administered medications for this visit.     Allergies as of 12/24/2017 - Review Complete 12/24/2017  Allergen Reaction Noted  . Codeine Hives and Rash 07/08/2012  . Penicillins Rash 07/08/2012  . Sulfa antibiotics Hives and Rash 07/08/2012     CLINICAL DATA:  Initial evaluation for acute altered mental status, confusion, lethargy.  EXAM: MRI HEAD WITHOUT CONTRAST  TECHNIQUE: Multiplanar, multiecho pulse sequences of the brain and surrounding structures were obtained without intravenous contrast.  COMPARISON:  Prior CT from 11/04/2017.  FINDINGS: Brain: Generalized age-related cerebral atrophy. Extensive T2/FLAIR hyperintensity involving the periventricular and deep white matter both cerebral hemispheres, most consistent with chronic microvascular ischemic changes. Chronic microvascular disease present within the pons as well. Superimposed remote lacunar infarcts present within the bilateral basal ganglia, deep white matter, and right thalamus.  No abnormal foci of restricted diffusion to suggest acute or subacute ischemia. Gray-white matter differentiation maintained. No evidence for remote cortical infarction. No acute intracranial hemorrhage. Small chronic microhemorrhage noted within the right thalamus.  No mass lesion, midline shift or mass effect. No hydrocephalus. No extra-axial fluid collection. Major dural sinuses are grossly patent.  Pituitary gland suprasellar region normal. Midline structures intact and normal.  Vascular: Major intracranial vascular flow voids are  maintained at the skull base.  Skull and upper cervical spine: Craniocervical junction within normal limits. Mild degenerate spondylolysis noted within the upper cervical spine without significant stenosis. Bone marrow signal intensity within normal limits. No scalp soft tissue abnormality.  Sinuses/Orbits: Globes and orbital soft tissues within normal limits. Patient status post cataract extraction bilaterally. Scattered mucosal thickening within the ethmoidal air cells and maxillary sinuses. Paranasal sinuses are otherwise clear. No significant mastoid effusion. Inner ear structures grossly normal.  Other: None.  IMPRESSION: 1. No acute intracranial abnormality. 2. Generalized age-related cerebral atrophy with advanced chronic microvascular ischemic disease with scattered remote lacunar infarcts as above.   Electronically Signed   By: Jeannine Boga M.D.   On: 11/06/2017 20:52    Vitals: BP (!) 164/95   Pulse 82   Ht 5\' 6"  (1.676 m)   Wt 127 lb (57.6 kg)   BMI 20.50 kg/m  Last Weight:  Wt Readings from Last 1 Encounters:  12/24/17 127 lb (57.6 kg)   Last Height:   Ht Readings from Last 1  Encounters:  12/24/17 5\' 6"  (1.676 m)    Physical exam:  General: The patient is awake, alert and appears not in acute distress. The patient is well groomed. Head: Normocephalic, atraumatic. Neck is supple. Mallampati 1, neck circumference:14- Cardiovascular:  Regular rate and rhythm , without  murmurs or carotid bruit, and without distended neck veins. Respiratory: Lungs are clear to auscultation. Skin:  Without evidence of edema, or rash Trunk: stooped posture.  Neurologic exam : The patient is awake and alert, oriented to place and time.  Memory subjective described as ' slow " There is a normal attention span & concentration ability-" small talk". Speech is fluent without dysarthria, dysphonia or aphasia. Mood and affect are appropriate.  MMSE - Mini Mental  State Exam 12/24/2017  Orientation to time 4  Orientation to Place 4  Registration 3  Attention/ Calculation 4  Recall 2  Language- name 2 objects 1  Language- repeat 1  Language- follow 3 step command 3  Language- read & follow direction 1  Write a sentence 1  Copy design 0  Total score 24     Cranial nerves: Pupils are equal and briskly reactive to light. Extraocular movements  in vertical and horizontal planes intact and without nystagmus.  Visual fields by finger perimetry are intact. Hearing to finger rub intact.  Facial sensation intact to fine touch. Facial motor strength is symmetric and tongue and uvula move midline. Tongue protrusion into either cheek is normal. Shoulder shrug is normal.   Motor exam:   Normal tone ,muscle bulk and symmetric strength in all extremities. Her knee buckles- she is unsteady . But she has good strength.   Sensory:  Fine touch, pinprick and vibration were tested in all extremities. Proprioception was abnormal  Coordination: Rapid alternating movements in the fingers/hands were normal.  Finger-to-nose maneuver  normal without evidence of ataxia, dysmetria or tremor.  Gait and station: Patient walks with a cane for  assistive device. Strength within normal limits.  Smaller step width.  Stance is wider based - she turned with 5 steps and used her cane. No hand tremor noticed, normal arm swing.  Deep tendon reflexes: in the  upper and lower extremities are symmetric 2 plus - and intact.  Babinski maneuver response is downgoing.  I added MOCA testing. The patient was well able to solve trail making and clock drawing, and named 2 of 3 exotic animals .She failed at copying a cube.   Assessment:  Dear Dr. Hilma Favors,  After physical and neurologic examination, review of laboratory studies, imaging, neurophysiology testing and pre-existing records, assessment of our mutual patient is  that of :   Advanced microvascular diease and recent admission for  metabolic encephalopathy- patient's main risk factor is hypertension. No hx of DM.   Early dementia or MCI - there is advanced atrophy with widened temporal sulci by brain MRI. Episodic loss of memory, amnestic spells.   Gait disorder related to advanced arthritis.   Plan:  Treatment plan and additional workup :  The patient's Blood pressure control is very important to slow the progression of neuro micro-vascular disease.  I will revisit with her in 4 month and offered in the meanwhile start her on Namenda , with the goal to start aricept as addition . She is at this time iclined to wait, have repeat memory testing and would than decide in 4 month to start medication or not.   Asencion Partridge Hazelle Woollard MD 12/24/2017

## 2017-12-24 NOTE — Patient Instructions (Signed)
Revisit in 4 month with MMSE . Possible enrollment in a Trial for memory loss patients.

## 2017-12-25 DIAGNOSIS — M170 Bilateral primary osteoarthritis of knee: Secondary | ICD-10-CM | POA: Diagnosis not present

## 2018-01-01 DIAGNOSIS — M1712 Unilateral primary osteoarthritis, left knee: Secondary | ICD-10-CM | POA: Diagnosis not present

## 2018-01-01 DIAGNOSIS — M170 Bilateral primary osteoarthritis of knee: Secondary | ICD-10-CM | POA: Diagnosis not present

## 2018-01-08 DIAGNOSIS — M170 Bilateral primary osteoarthritis of knee: Secondary | ICD-10-CM | POA: Diagnosis not present

## 2018-01-22 ENCOUNTER — Ambulatory Visit: Payer: Medicare Other | Admitting: Neurology

## 2018-01-25 ENCOUNTER — Emergency Department (HOSPITAL_COMMUNITY): Payer: Medicare Other

## 2018-01-25 ENCOUNTER — Encounter (HOSPITAL_COMMUNITY): Payer: Self-pay | Admitting: Emergency Medicine

## 2018-01-25 ENCOUNTER — Emergency Department (HOSPITAL_COMMUNITY)
Admission: EM | Admit: 2018-01-25 | Discharge: 2018-01-25 | Disposition: A | Discharge status: Home / Self Care | Payer: Medicare Other | Attending: Emergency Medicine | Admitting: Emergency Medicine

## 2018-01-25 DIAGNOSIS — S52591A Other fractures of lower end of right radius, initial encounter for closed fracture: Secondary | ICD-10-CM | POA: Diagnosis not present

## 2018-01-25 DIAGNOSIS — Y939 Activity, unspecified: Secondary | ICD-10-CM | POA: Diagnosis not present

## 2018-01-25 DIAGNOSIS — M25522 Pain in left elbow: Secondary | ICD-10-CM | POA: Diagnosis not present

## 2018-01-25 DIAGNOSIS — Z79899 Other long term (current) drug therapy: Secondary | ICD-10-CM | POA: Insufficient documentation

## 2018-01-25 DIAGNOSIS — W19XXXA Unspecified fall, initial encounter: Secondary | ICD-10-CM | POA: Diagnosis not present

## 2018-01-25 DIAGNOSIS — R531 Weakness: Secondary | ICD-10-CM

## 2018-01-25 DIAGNOSIS — Y929 Unspecified place or not applicable: Secondary | ICD-10-CM | POA: Diagnosis not present

## 2018-01-25 DIAGNOSIS — S62101A Fracture of unspecified carpal bone, right wrist, initial encounter for closed fracture: Secondary | ICD-10-CM

## 2018-01-25 DIAGNOSIS — Y999 Unspecified external cause status: Secondary | ICD-10-CM | POA: Diagnosis not present

## 2018-01-25 DIAGNOSIS — Y92009 Unspecified place in unspecified non-institutional (private) residence as the place of occurrence of the external cause: Secondary | ICD-10-CM

## 2018-01-25 DIAGNOSIS — I10 Essential (primary) hypertension: Secondary | ICD-10-CM | POA: Diagnosis not present

## 2018-01-25 DIAGNOSIS — Z7982 Long term (current) use of aspirin: Secondary | ICD-10-CM | POA: Diagnosis not present

## 2018-01-25 DIAGNOSIS — S59902A Unspecified injury of left elbow, initial encounter: Secondary | ICD-10-CM | POA: Diagnosis not present

## 2018-01-25 DIAGNOSIS — R05 Cough: Secondary | ICD-10-CM | POA: Diagnosis not present

## 2018-01-25 DIAGNOSIS — R296 Repeated falls: Secondary | ICD-10-CM | POA: Diagnosis not present

## 2018-01-25 DIAGNOSIS — S52501A Unspecified fracture of the lower end of right radius, initial encounter for closed fracture: Secondary | ICD-10-CM | POA: Diagnosis not present

## 2018-01-25 HISTORY — DX: Pain in unspecified knee: M25.569

## 2018-01-25 HISTORY — DX: Unsteadiness on feet: R26.81

## 2018-01-25 HISTORY — DX: Mild cognitive impairment of uncertain or unknown etiology: G31.84

## 2018-01-25 LAB — CBC WITH DIFFERENTIAL/PLATELET
BASOS ABS: 0 10*3/uL (ref 0.0–0.1)
Basophils Relative: 0 %
EOS PCT: 2 %
Eosinophils Absolute: 0.1 10*3/uL (ref 0.0–0.7)
HCT: 35.7 % — ABNORMAL LOW (ref 36.0–46.0)
Hemoglobin: 11.8 g/dL — ABNORMAL LOW (ref 12.0–15.0)
LYMPHS ABS: 1.5 10*3/uL (ref 0.7–4.0)
LYMPHS PCT: 30 %
MCH: 31.6 pg (ref 26.0–34.0)
MCHC: 33.1 g/dL (ref 30.0–36.0)
MCV: 95.7 fL (ref 78.0–100.0)
MONOS PCT: 24 %
Monocytes Absolute: 1.2 10*3/uL — ABNORMAL HIGH (ref 0.1–1.0)
Neutro Abs: 2.2 10*3/uL (ref 1.7–7.7)
Neutrophils Relative %: 44 %
PLATELETS: 226 10*3/uL (ref 150–400)
RBC: 3.73 MIL/uL — ABNORMAL LOW (ref 3.87–5.11)
RDW: 12.7 % (ref 11.5–15.5)
WBC: 5.1 10*3/uL (ref 4.0–10.5)

## 2018-01-25 LAB — URINALYSIS, ROUTINE W REFLEX MICROSCOPIC
BILIRUBIN URINE: NEGATIVE
Glucose, UA: NEGATIVE mg/dL
HGB URINE DIPSTICK: NEGATIVE
Ketones, ur: NEGATIVE mg/dL
NITRITE: NEGATIVE
PH: 7 (ref 5.0–8.0)
Protein, ur: NEGATIVE mg/dL
RBC / HPF: NONE SEEN RBC/hpf (ref 0–5)
SPECIFIC GRAVITY, URINE: 1.002 — AB (ref 1.005–1.030)
Squamous Epithelial / LPF: NONE SEEN

## 2018-01-25 LAB — COMPREHENSIVE METABOLIC PANEL
ALT: 17 U/L (ref 14–54)
AST: 28 U/L (ref 15–41)
Albumin: 3.4 g/dL — ABNORMAL LOW (ref 3.5–5.0)
Alkaline Phosphatase: 72 U/L (ref 38–126)
Anion gap: 11 (ref 5–15)
BUN: 9 mg/dL (ref 6–20)
CALCIUM: 8.8 mg/dL — AB (ref 8.9–10.3)
CO2: 23 mmol/L (ref 22–32)
Chloride: 99 mmol/L — ABNORMAL LOW (ref 101–111)
Creatinine, Ser: 0.48 mg/dL (ref 0.44–1.00)
GFR calc non Af Amer: >60 mL/min (ref >60)
Glucose, Bld: 115 mg/dL — ABNORMAL HIGH (ref 65–99)
Potassium: 3.5 mmol/L (ref 3.5–5.1)
SODIUM: 133 mmol/L — AB (ref 135–145)
Total Bilirubin: 0.5 mg/dL (ref 0.3–1.2)
Total Protein: 6.3 g/dL — ABNORMAL LOW (ref 6.5–8.1)

## 2018-01-25 LAB — CBG MONITORING, ED: Glucose-Capillary: 125 mg/dL — ABNORMAL HIGH (ref 65–99)

## 2018-01-25 LAB — TROPONIN I: Troponin I: <0.03 ng/mL (ref <0.03)

## 2018-01-25 LAB — LACTIC ACID, PLASMA: LACTIC ACID, VENOUS: 1.6 mmol/L (ref 0.5–1.9)

## 2018-01-25 MED ORDER — DOUBLE ANTIBIOTIC 500-10000 UNIT/GM EX OINT
TOPICAL_OINTMENT | CUTANEOUS | Status: AC
  Administered 2018-01-25: 17:00:00
  Filled 2018-01-25: qty 1

## 2018-01-25 NOTE — ED Provider Notes (Signed)
[facility] EMERGENCY DEPARTMENT Provider Note   CSN: [id number] Arrival date & time: 01/25/18  1512     History   Chief Complaint Chief Complaint  Patient presents with  . Cough  . Weakness    HPI Claire West is a 77 y.o. female.  HPI  Pt was seen at 1525. Per pt and her family, c/o gradual onset and persistence of constant generalized weakness/fatigue for the past 1 to 2 weeks. Pt's family states pt has "fallen a few times." Pt most recently fell out of bed "because she forgot that the night table was in the room she was sleeping in" (pt forgot family was staying with her in a different room than usual). Pt states her right wrist "still hurts" from a fall a week ago. Pt's family has been taking pt's BP every few hours every day for the past week and states "her BP keeps going up and down" and "thinks her medicines need to be adjusted."  Pt has been taking her meds as prescribed. Denies CP/palpitations, no SOB/cough, no abd pain, no N/V/D, no focal motor weakness, no tingling/numbness in extremities, no fevers, no syncope.    Past Medical History:  Diagnosis Date  . Depression   . Gait instability    walks with cane  . GERD (gastroesophageal reflux disease)   . Hyperlipemia   . Hypertension   . Knee pain   . Mild cognitive impairment with memory loss   . Seasonal allergies     Patient Active Problem List   Diagnosis Date Noted  . Amnestic MCI (mild cognitive impairment with memory loss) 12/24/2017  . Acute metabolic encephalopathy 50/06/3817  . Altered mental status 11/06/2017  . Hyponatremia 02/12/2016  . Hypokalemia 02/12/2016  . Dehydration 02/12/2016  . Elevated LFTs 02/12/2016  . HTN (hypertension) 02/12/2016  . GERD (gastroesophageal reflux disease) 02/12/2016    Past Surgical History:  Procedure Laterality Date  . ABDOMINAL HYSTERECTOMY    . ARTHROSCOPIC REPAIR ACL    . COLONOSCOPY    . COLONOSCOPY N/A 11/23/2015   Procedure: COLONOSCOPY;  Surgeon:  Rogene Houston, MD;  Location: [facility];  Service: Endoscopy;  Laterality: N/A;  106     OB History    Gravida  1   Para  1   Term  1   Preterm      AB      Living  1     SAB      TAB      Ectopic      Multiple      Live Births               Home Medications    Prior to Admission medications   Medication Sig Start Date End Date Taking? Authorizing Provider  Cholecalciferol (VITAMIN D PO) Take 1 capsule by mouth daily.   Yes [provider]  Cyclobenzaprine HCl (FEXMID PO) Take 1 tablet by mouth daily.   Yes [provider]  escitalopram (LEXAPRO) 5 MG tablet Take 10 mg by mouth at bedtime.  04/23/15  Yes [provider]  metoprolol succinate (TOPROL-XL) 100 MG 24 hr tablet metoprolol succinate ER 100 mg tablet,extended release 24 hr   Yes [provider]  Multiple Vitamin (MULTIVITAMIN WITH MINERALS) TABS tablet Take 1 tablet by mouth daily.   Yes [provider]  omeprazole (PRILOSEC) 20 MG capsule Take 20 mg by mouth every other day.  04/18/15  Yes [provider]  potassium chloride (K-DUR) 10 MEQ tablet Take 1 tablet (10 mEq total) by mouth daily. 11/04/17  Yes Noemi Chapel, MD  valsartan (DIOVAN) 320 MG tablet  12/18/17  Yes [provider]  aspirin EC 81 MG tablet Take 81 mg by mouth daily.    [provider]  losartan (COZAAR) 100 MG tablet Take 1 tablet (100 mg total) by mouth daily. Patient not taking: Reported on 01/25/2018 11/09/17   Orson Eva, MD  meloxicam (MOBIC) 15 MG tablet Take 1 tablet by mouth daily. 01/18/16   [provider]    Family History Family History  Problem Relation Age of Onset  . Lung cancer Mother   . Lung cancer Sister     Social History Social History   Tobacco Use  . Smoking status: Never Smoker  . Smokeless tobacco: Never Used  Substance Use Topics  . Alcohol use: No  . Drug use: No     Allergies   Codeine; Penicillins; and Sulfa  antibiotics   Review of Systems Review of Systems ROS: Statement: All systems negative except as marked or noted in the HPI; Constitutional: Negative for fever and chills. +generalized weakness/fatigue.; ; Eyes: Negative for eye pain, redness and discharge. ; ; ENMT: Negative for ear pain, hoarseness, nasal congestion, sinus pressure and sore throat. ; ; Cardiovascular: Negative for chest pain, palpitations, diaphoresis, dyspnea and peripheral edema. ; ; Respiratory: +cough. Negative for wheezing and stridor. ; ; Gastrointestinal: Negative for nausea, vomiting, diarrhea, abdominal pain, blood in stool, hematemesis, jaundice and rectal bleeding. ; ; Genitourinary: Negative for dysuria, flank pain and hematuria. ; ; Musculoskeletal: +right wrist pain. Negative for back pain and neck pain. Negative for swelling and deformity.; ; Skin: +abrasions, bruising. Negative for pruritus, rash, blisters, and skin lesion.; ; Neuro:  Negative for headache, lightheadedness and neck stiffness. Negative for altered level of consciousness, altered mental status, extremity weakness, paresthesias, involuntary movement, seizure and syncope.       Physical Exam Updated Vital Signs BP (!) 170/76   Pulse 80   Temp 98 F (36.7 C) (Oral)   Resp 13   Ht 5\' 6"  (1.676 m)   Wt 56.7 kg (125 lb)   SpO2 97%   BMI 20.18 kg/m    BP (!) 177/79 (BP Location: Left Arm)   Pulse 81   Temp 98 F (36.7 C) (Oral)   Resp 18   Ht 5\' 6"  (1.676 m)   Wt 56.7 kg (125 lb)   SpO2 98%   BMI 20.18 kg/m    15:59 Orthostatic Vital Signs VB  Orthostatic Lying   BP- Lying: 170/76   Pulse- Lying: 78       Orthostatic Sitting  BP- Sitting: 165/91Abnormal    Pulse- Sitting: 86       Orthostatic Standing at 0 minutes  BP- Standing at 0 minutes: 181/93Abnormal    Pulse- Standing at 0 minutes: 100      Physical Exam 1530: Physical examination:  Nursing notes reviewed; Vital signs and O2 SAT reviewed;  Constitutional: Well  developed, Well nourished, Well hydrated, In no acute distress; Head:  Normocephalic, atraumatic; Eyes: EOMI, PERRL, No scleral icterus; ENMT: Mouth and pharynx normal, Mucous membranes moist; Neck: Supple, Full range of motion, No lymphadenopathy; Cardiovascular: Regular rate and rhythm, No gallop; Respiratory: Breath sounds clear & equal bilaterally, No wheezes.  Speaking full sentences with ease, Normal respiratory effort/excursion; Chest: Nontender, Movement normal; Abdomen: Soft, Nontender, Nondistended, Normal bowel sounds; Genitourinary: No CVA tenderness; Extremities:  Peripheral pulses normal, +fading ecchymosis right medial elbow. +superficial abrasions left elbow. FROM bilat UE's without joints pain/tenderness. NT left elbow/wrist/hand. NT right elbow/wrist/hand. No deformity, no erythema, No edema, No calf edema or asymmetry.; Neuro: AA&Ox3, Major CN grossly intact. No facial droop. Speech clear. No gross focal motor or sensory deficits in extremities.; Skin: Color normal, Warm, Dry.   ED Treatments / Results  Labs (all labs ordered are listed, but only abnormal results are displayed)   EKG EKG Interpretation  Date/Time:  Saturday January 25 2018 15:59:46 EDT Ventricular Rate:  78 PR Interval:    QRS Duration: 105 QT Interval:  383 QTC Calculation: 437 R Axis:   -14 Text Interpretation:  Sinus rhythm When compared with ECG of 11/06/2017 No significant change was found Confirmed by Francine Graven [phone]) on 01/25/2018 4:04:45 PM   Radiology   Procedures Procedures (including critical care time)  Medications Ordered in ED Medications - No data to display   Initial Impression / Assessment and Plan / ED Course  I have reviewed the triage vital signs and the nursing notes.  Pertinent labs & imaging results that were available during my care of the patient were reviewed by me and considered in my medical decision making (see chart for details).  MDM Reviewed: previous chart,  nursing note and vitals Reviewed previous: labs and ECG Interpretation: labs, ECG, x-ray and CT scan    Results for orders placed or performed during the hospital encounter of 01/25/18  Urinalysis, Routine w reflex microscopic  Result Value Ref Range   Color, Urine STRAW (A) YELLOW   APPearance CLEAR CLEAR   Specific Gravity, Urine 1.002 (L) 1.005 - 1.030   pH 7.0 5.0 - 8.0   Glucose, UA NEGATIVE NEGATIVE mg/dL   Hgb urine dipstick NEGATIVE NEGATIVE   Bilirubin Urine NEGATIVE NEGATIVE   Ketones, ur NEGATIVE NEGATIVE mg/dL   Protein, ur NEGATIVE NEGATIVE mg/dL   Nitrite NEGATIVE NEGATIVE   Leukocytes, UA TRACE (A) NEGATIVE   RBC / HPF NONE SEEN 0 - 5 RBC/hpf   WBC, UA 0-5 0 - 5 WBC/hpf   Bacteria, UA RARE (A) NONE SEEN   Squamous Epithelial / LPF NONE SEEN NONE SEEN  Comprehensive metabolic panel  Result Value Ref Range   Sodium 133 (L) 135 - 145 mmol/L   Potassium 3.5 3.5 - 5.1 mmol/L   Chloride 99 (L) 101 - 111 mmol/L   CO2 23 22 - 32 mmol/L   Glucose, Bld 115 (H) 65 - 99 mg/dL   BUN 9 6 - 20 mg/dL   Creatinine, Ser 0.48 0.44 - 1.00 mg/dL   Calcium 8.8 (L) 8.9 - 10.3 mg/dL   Total Protein 6.3 (L) 6.5 - 8.1 g/dL   Albumin 3.4 (L) 3.5 - 5.0 g/dL   AST 28 15 - 41 U/L   ALT 17 14 - 54 U/L   Alkaline Phosphatase 72 38 - 126 U/L   Total Bilirubin 0.5 0.3 - 1.2 mg/dL   GFR calc non Af Amer >60 >60 mL/min   GFR calc Af Amer >60 >60 mL/min   Anion gap 11 5 - 15  Troponin I  Result Value Ref Range   Troponin I <0.03 <0.03 ng/mL  Lactic acid, plasma  Result Value Ref Range   Lactic Acid, Venous 1.6 0.5 - 1.9 mmol/L  CBC with Differential  Result Value Ref Range   WBC 5.1 4.0 - 10.5 K/uL   RBC 3.73 (L) 3.87 - 5.11 MIL/uL   Hemoglobin  11.8 (L) 12.0 - 15.0 g/dL   HCT 35.7 (L) 36.0 - 46.0 %   MCV 95.7 78.0 - 100.0 fL   MCH 31.6 26.0 - 34.0 pg   MCHC 33.1 30.0 - 36.0 g/dL   RDW 12.7 11.5 - 15.5 %   Platelets 226 150 - 400 K/uL   Neutrophils Relative % 44 %   Neutro Abs  2.2 1.7 - 7.7 K/uL   Lymphocytes Relative 30 %   Lymphs Abs 1.5 0.7 - 4.0 K/uL   Monocytes Relative 24 %   Monocytes Absolute 1.2 (H) 0.1 - 1.0 K/uL   Eosinophils Relative 2 %   Eosinophils Absolute 0.1 0.0 - 0.7 K/uL   Basophils Relative 0 %   Basophils Absolute 0.0 0.0 - 0.1 K/uL  CBG monitoring, ED  Result Value Ref Range   Glucose-Capillary 125 (H) 65 - 99 mg/dL   Dg Chest 2 View Result Date: 01/25/2018 CLINICAL DATA:  Cough and weakness for 1 week EXAM: CHEST - 2 VIEW COMPARISON:  11/06/2017 FINDINGS: The heart size and mediastinal contours are within normal limits. Both lungs are clear. The visualized skeletal structures are unremarkable. IMPRESSION: No active cardiopulmonary disease. Electronically Signed   By: Inez Catalina M.D.   On: 01/25/2018 16:52   Dg Elbow Complete Left Result Date: 01/25/2018 CLINICAL DATA:  Recent fall with left elbow pain, initial encounter EXAM: LEFT ELBOW - COMPLETE 3+ VIEW COMPARISON:  None. FINDINGS: There is no evidence of fracture, dislocation, or joint effusion. There is no evidence of arthropathy or other focal bone abnormality. Soft tissues are unremarkable. IMPRESSION: No acute abnormality noted. Electronically Signed   By: Inez Catalina M.D.   On: 01/25/2018 16:54   Dg Wrist Complete Right Result Date: 01/25/2018 CLINICAL DATA:  Recent fall with wrist pain, initial encounter EXAM: RIGHT WRIST - COMPLETE 3+ VIEW COMPARISON:  11/24/2016 FINDINGS: Impacted fracture of the distal radius is identified. No definitive intra-articular involvement is noted. Mild avulsion fracture of the ulnar styloid is noted. No gross soft tissue abnormality is seen. IMPRESSION: Impacted fracture of the distal radius is noted with associated ulnar styloid avulsion. Electronically Signed   By: Inez Catalina M.D.   On: 01/25/2018 16:58   Ct Head Wo Contrast Result Date: 01/25/2018 CLINICAL DATA:  Week with several falls, initial encounter EXAM: CT HEAD WITHOUT CONTRAST CT  CERVICAL SPINE WITHOUT CONTRAST TECHNIQUE: Multidetector CT imaging of the head and cervical spine was performed following the standard protocol without intravenous contrast. Multiplanar CT image reconstructions of the cervical spine were also generated. COMPARISON:  11/04/2017 FINDINGS: CT HEAD FINDINGS Brain: Mild atrophic and chronic white matter ischemic changes are noted. No findings to suggest acute hemorrhage, acute infarction or space-occupying mass lesion are noted. Vascular: No hyperdense vessel or unexpected calcification. Skull: Normal. Negative for fracture or focal lesion. Sinuses/Orbits: No acute finding. Other: None. CT CERVICAL SPINE FINDINGS Alignment: Loss of the normal cervical lordosis is noted. This may be related to muscular spasm. Skull base and vertebrae: 7 cervical segments are well visualized. Multilevel facet hypertrophic changes are noted. Resultant anterolisthesis of C3 on C4 is seen. Disc space narrowing at C5-6 and C6-7 is noted Soft tissues and spinal canal: Within normal limits. Upper chest: Biapical pleural and parenchymal scarring is noted. Other: None IMPRESSION: CT of the head: Chronic atrophic and ischemic changes. CT of the cervical spine: Degenerative change as described above. No acute abnormality is noted. Electronically Signed   By: Linus Mako.D.  On: 01/25/2018 16:36   Ct Cervical Spine Wo Contrast Result Date: 01/25/2018 CLINICAL DATA:  Week with several falls, initial encounter EXAM: CT HEAD WITHOUT CONTRAST CT CERVICAL SPINE WITHOUT CONTRAST TECHNIQUE: Multidetector CT imaging of the head and cervical spine was performed following the standard protocol without intravenous contrast. Multiplanar CT image reconstructions of the cervical spine were also generated. COMPARISON:  11/04/2017 FINDINGS: CT HEAD FINDINGS Brain: Mild atrophic and chronic white matter ischemic changes are noted. No findings to suggest acute hemorrhage, acute infarction or space-occupying  mass lesion are noted. Vascular: No hyperdense vessel or unexpected calcification. Skull: Normal. Negative for fracture or focal lesion. Sinuses/Orbits: No acute finding. Other: None. CT CERVICAL SPINE FINDINGS Alignment: Loss of the normal cervical lordosis is noted. This may be related to muscular spasm. Skull base and vertebrae: 7 cervical segments are well visualized. Multilevel facet hypertrophic changes are noted. Resultant anterolisthesis of C3 on C4 is seen. Disc space narrowing at C5-6 and C6-7 is noted Soft tissues and spinal canal: Within normal limits. Upper chest: Biapical pleural and parenchymal scarring is noted. Other: None IMPRESSION: CT of the head: Chronic atrophic and ischemic changes. CT of the cervical spine: Degenerative change as described above. No acute abnormality is noted. Electronically Signed   By: Inez Catalina M.D.   On: 01/25/2018 16:36     1535:  Pt's family member showed me a notebook of pt's home BP's. They apparently are taking pt's BP every few hours every day, for the past week. Long d/w pt and family regarding BP, including but not limited to:  Role of ED in chronic HTN management (via medical literature) vs PMD role in chronic BP management, natural daily cycle of BP up/down, checking home BP monitor at doctor's office to compare readings, taking BP only once per day under similar conditions (ie: morning 1 hour after meds or qhs), not taking BP multiple times per day/every day, control of anxiety/pain, as well as taking meds as prescribed. I informed pt and family that, per literature based recommendations, I would not be "adjusting" her BP meds today; and my reasoning why. Both verb understanding.   1820:  BP's today consistent with previous on file. Pt is not orthostatic on VS. Pt is able to ambulate with 1 assist (usually uses cane). Wrist fx splinted, sling for comfort/elevation. Workup is otherwise reassuring. No clear indication for admission at this time. Home  Health consult placed. Dx and testing d/w pt and family.  Questions answered.  Verb understanding, agreeable to d/c home with outpt f/u.     Final Clinical Impressions(s) / ED Diagnoses   Final diagnoses:  None    ED Discharge Orders    None       Francine Graven, DO 01/29/18 1808

## 2018-01-25 NOTE — Discharge Instructions (Signed)
Take your usual prescriptions as previously directed.  Take over the counter tylenol, as directed on packaging, as needed for discomfort. Wear the splint and use the sling for comfort until you are seen in follow up by the Orthopedic doctor. Call your regular medical doctor on Monday to schedule a follow up appointment within the next 2 to 3 days. Call the Orthopedic doctor on Monday to schedule a follow up appointment this week.  Return to the Emergency Department immediately sooner if worsening.

## 2018-01-25 NOTE — ED Notes (Signed)
Left elbow skin tears cleaned with NS, applied ABO ointment, non stick and wrapped

## 2018-01-25 NOTE — ED Notes (Signed)
Pt ambulated with SBA in hall without difficutly

## 2018-01-25 NOTE — ED Notes (Signed)
Pt reports that she has been weak and has had several falls over the last 2 weeks. Pt lives alone. Pt is alert and oriented. Follows directions

## 2018-01-25 NOTE — ED Notes (Signed)
Pt transported to xray 

## 2018-01-25 NOTE — ED Triage Notes (Addendum)
Patient presents with complaints of cough and  weakness x 1 week. Patient is alert and oriented in triage. States symptoms have been unchanged in past week.

## 2018-01-27 DIAGNOSIS — S52501A Unspecified fracture of the lower end of right radius, initial encounter for closed fracture: Secondary | ICD-10-CM | POA: Diagnosis not present

## 2018-01-27 DIAGNOSIS — M79641 Pain in right hand: Secondary | ICD-10-CM | POA: Diagnosis not present

## 2018-01-27 DIAGNOSIS — S50312A Abrasion of left elbow, initial encounter: Secondary | ICD-10-CM | POA: Diagnosis not present

## 2018-01-27 LAB — URINE CULTURE: Culture: >=100000 — AB

## 2018-01-28 ENCOUNTER — Telehealth: Payer: Self-pay | Admitting: Emergency Medicine

## 2018-01-28 NOTE — Telephone Encounter (Signed)
Post ED Visit - Positive Culture Follow-up  Culture report reviewed by antimicrobial stewardship pharmacist:  [x]  Elenor Quinones, Pharm.D. []  Heide Guile, Pharm.D., BCPS AQ-ID []  Parks Neptune, Pharm.D., BCPS []  Alycia Rossetti, Pharm.D., BCPS []  [place], [place].D., BCPS, AAHIVP []  Legrand Como, Pharm.D., BCPS, AAHIVP []  Salome Arnt, PharmD, BCPS []  Jalene Mullet, PharmD []  Vincenza Hews, PharmD, BCPS  Positive urine culture Treated with none,asymptomatic, organism sensitive to the same and no further patient follow-up is required at this time.  Hazle Nordmann 01/28/2018, 9:41 AM

## 2018-01-28 NOTE — Progress Notes (Signed)
ED Antimicrobial Stewardship Positive Culture Follow Up   Claire West is an 77 y.o. female who presented to [facility] on 01/25/2018 with a chief complaint of  Chief Complaint  Patient presents with  . Cough  . Weakness    Recent Results (from the past 720 hour(s))  Urine culture     Status: Abnormal   Collection Time: 01/25/18  5:00 PM  Result Value Ref Range Status   Specimen Description   Final    URINE, CLEAN CATCH Performed at [facility], [address] [place], [place] [address]    Special Requests   Final    NONE Performed at [facility], [address] [place], [place] [address]    Culture >=100,000 COLONIES/mL ESCHERICHIA COLI (A)  Final   Report Status 01/27/2018 FINAL  Final   Organism ID, Bacteria ESCHERICHIA COLI (A)  Final      Susceptibility   Escherichia coli - MIC*    AMPICILLIN <=2 SENSITIVE Sensitive     CEFAZOLIN <=4 SENSITIVE Sensitive     CEFTRIAXONE <=1 SENSITIVE Sensitive     CIPROFLOXACIN <=0.25 SENSITIVE Sensitive     GENTAMICIN <=1 SENSITIVE Sensitive     IMIPENEM <=0.25 SENSITIVE Sensitive     NITROFURANTOIN <=16 SENSITIVE Sensitive     TRIMETH/SULFA <=20 SENSITIVE Sensitive     AMPICILLIN/SULBACTAM <=2 SENSITIVE Sensitive     PIP/TAZO <=4 SENSITIVE Sensitive     Extended ESBL NEGATIVE Sensitive     * >=100,000 COLONIES/mL ESCHERICHIA COLI   Pt with no urinary symptoms or flank pain.   Plan: no antibiotic therapy indicated at this time.   ED Provider: Martinique Robinson PA-C   Karmen Stabs, PharmD Candidate 01/28/2018, 8:55 AM Phone# [phone]

## 2018-01-31 DIAGNOSIS — Z6821 Body mass index (BMI) 21.0-21.9, adult: Secondary | ICD-10-CM | POA: Diagnosis not present

## 2018-01-31 DIAGNOSIS — M1991 Primary osteoarthritis, unspecified site: Secondary | ICD-10-CM | POA: Diagnosis not present

## 2018-01-31 DIAGNOSIS — I10 Essential (primary) hypertension: Secondary | ICD-10-CM | POA: Diagnosis not present

## 2018-02-12 DIAGNOSIS — S52509A Unspecified fracture of the lower end of unspecified radius, initial encounter for closed fracture: Secondary | ICD-10-CM

## 2018-02-12 DIAGNOSIS — S52501D Unspecified fracture of the lower end of right radius, subsequent encounter for closed fracture with routine healing: Secondary | ICD-10-CM | POA: Diagnosis not present

## 2018-02-12 DIAGNOSIS — M79641 Pain in right hand: Secondary | ICD-10-CM | POA: Diagnosis not present

## 2018-02-12 DIAGNOSIS — S50312D Abrasion of left elbow, subsequent encounter: Secondary | ICD-10-CM | POA: Diagnosis not present

## 2018-02-12 HISTORY — DX: Unspecified fracture of the lower end of unspecified radius, initial encounter for closed fracture: S52.509A

## 2018-02-20 DIAGNOSIS — M170 Bilateral primary osteoarthritis of knee: Secondary | ICD-10-CM | POA: Diagnosis not present

## 2018-03-10 DIAGNOSIS — Z6821 Body mass index (BMI) 21.0-21.9, adult: Secondary | ICD-10-CM | POA: Diagnosis not present

## 2018-03-10 DIAGNOSIS — E782 Mixed hyperlipidemia: Secondary | ICD-10-CM | POA: Diagnosis not present

## 2018-03-10 DIAGNOSIS — I10 Essential (primary) hypertension: Secondary | ICD-10-CM | POA: Diagnosis not present

## 2018-03-10 DIAGNOSIS — E871 Hypo-osmolality and hyponatremia: Secondary | ICD-10-CM | POA: Diagnosis not present

## 2018-03-10 DIAGNOSIS — F0390 Unspecified dementia without behavioral disturbance: Secondary | ICD-10-CM | POA: Diagnosis not present

## 2018-03-10 DIAGNOSIS — M1991 Primary osteoarthritis, unspecified site: Secondary | ICD-10-CM | POA: Diagnosis not present

## 2018-03-10 DIAGNOSIS — Z1389 Encounter for screening for other disorder: Secondary | ICD-10-CM | POA: Diagnosis not present

## 2018-03-10 DIAGNOSIS — Z01810 Encounter for preprocedural cardiovascular examination: Secondary | ICD-10-CM | POA: Diagnosis not present

## 2018-03-10 DIAGNOSIS — E538 Deficiency of other specified B group vitamins: Secondary | ICD-10-CM | POA: Diagnosis not present

## 2018-03-10 DIAGNOSIS — R7309 Other abnormal glucose: Secondary | ICD-10-CM | POA: Diagnosis not present

## 2018-03-19 DIAGNOSIS — M79641 Pain in right hand: Secondary | ICD-10-CM | POA: Diagnosis not present

## 2018-03-19 DIAGNOSIS — S52501D Unspecified fracture of the lower end of right radius, subsequent encounter for closed fracture with routine healing: Secondary | ICD-10-CM | POA: Diagnosis not present

## 2018-03-31 ENCOUNTER — Encounter

## 2018-03-31 ENCOUNTER — Encounter: Payer: Medicare Other | Admitting: Psychology

## 2018-04-01 ENCOUNTER — Encounter: Payer: Medicare Other | Admitting: Psychology

## 2018-04-01 DIAGNOSIS — M1712 Unilateral primary osteoarthritis, left knee: Secondary | ICD-10-CM | POA: Diagnosis not present

## 2018-04-15 NOTE — Progress Notes (Signed)
01-25-18  (Epic) EKG, CXR

## 2018-04-15 NOTE — Patient Instructions (Addendum)
Claire West  04/15/2018   Your procedure is scheduled on: 05-05-18   Report to [facility] Main  Entrance    Report to Admitting at 6:55 AM    Call this number if you have problems the morning of surgery [phone]   Remember: Do not eat food or drink liquids :After Midnight.     Take these medicines the morning of surgery with A SIP OF WATER: Metoprolol Succinate (Toprol-XL), Omeprazole (Prilosec), and Fexofenadine (Allegra)                                 You may not have any metal on your body including hair pins and              piercings  Do not wear jewelry, make-up, lotions, powders or perfumes, deodorant             Do not wear nail polish.  Do not shave  48 hours prior to surgery.     Do not bring valuables to the hospital. [place].  Contacts, dentures or bridgework may not be worn into surgery.  Leave suitcase in the car. After surgery it may be brought to your room.       Special Instructions: N/A              Please read over the following fact sheets you were given: _____________________________________________________________________          [facility] - Preparing for Surgery Before surgery, you can play an important role.  Because skin is not sterile, your skin needs to be as free of germs as possible.  You can reduce the number of germs on your skin by washing with CHG (chlorahexidine gluconate) soap before surgery.  CHG is an antiseptic cleaner which kills germs and bonds with the skin to continue killing germs even after washing. Please DO NOT use if you have an allergy to CHG or antibacterial soaps.  If your skin becomes reddened/irritated stop using the CHG and inform your nurse when you arrive at Short Stay. Do not shave (including legs and underarms) for at least 48 hours prior to the first CHG shower.  You may shave your face/neck. Please follow these instructions  carefully:  1.  Shower with CHG Soap the night before surgery and the  morning of Surgery.  2.  If you choose to wash your hair, wash your hair first as usual with your  normal  shampoo.  3.  After you shampoo, rinse your hair and body thoroughly to remove the  shampoo.                           4.  Use CHG as you would any other liquid soap.  You can apply chg directly  to the skin and wash                       Gently with a scrungie or clean washcloth.  5.  Apply the CHG Soap to your body ONLY FROM THE NECK DOWN.   Do not use on face/ open  Wound or open sores. Avoid contact with eyes, ears mouth and genitals (private parts).                       Wash face,  Genitals (private parts) with your normal soap.             6.  Wash thoroughly, paying special attention to the area where your surgery  will be performed.  7.  Thoroughly rinse your body with warm water from the neck down.  8.  DO NOT shower/wash with your normal soap after using and rinsing off  the CHG Soap.                9.  Pat yourself dry with a clean towel.            10.  Wear clean pajamas.            11.  Place clean sheets on your bed the night of your first shower and do not  sleep with pets. Day of Surgery : Do not apply any lotions/deodorants the morning of surgery.  Please wear clean clothes to the hospital/surgery center.  FAILURE TO FOLLOW THESE INSTRUCTIONS MAY RESULT IN THE CANCELLATION OF YOUR SURGERY PATIENT SIGNATURE_________________________________  NURSE SIGNATURE__________________________________  ________________________________________________________________________   Adam Phenix  An incentive spirometer is a tool that can help keep your lungs clear and active. This tool measures how well you are filling your lungs with each breath. Taking long deep breaths may help reverse or decrease the chance of developing breathing (pulmonary) problems (especially infection)  following:  A long period of time when you are unable to move or be active. BEFORE THE PROCEDURE   If the spirometer includes an indicator to show your best effort, your nurse or respiratory therapist will set it to a desired goal.  If possible, sit up straight or lean slightly forward. Try not to slouch.  Hold the incentive spirometer in an upright position. INSTRUCTIONS FOR USE  1. Sit on the edge of your bed if possible, or sit up as far as you can in bed or on a chair. 2. Hold the incentive spirometer in an upright position. 3. Breathe out normally. 4. Place the mouthpiece in your mouth and seal your lips tightly around it. 5. Breathe in slowly and as deeply as possible, raising the piston or the ball toward the top of the column. 6. Hold your breath for 3-5 seconds or for as long as possible. Allow the piston or ball to fall to the bottom of the column. 7. Remove the mouthpiece from your mouth and breathe out normally. 8. Rest for a few seconds and repeat Steps 1 through 7 at least 10 times every 1-2 hours when you are awake. Take your time and take a few normal breaths between deep breaths. 9. The spirometer may include an indicator to show your best effort. Use the indicator as a goal to work toward during each repetition. 10. After each set of 10 deep breaths, practice coughing to be sure your lungs are clear. If you have an incision (the cut made at the time of surgery), support your incision when coughing by placing a pillow or rolled up towels firmly against it. Once you are able to get out of bed, walk around indoors and cough well. You may stop using the incentive spirometer when instructed by your caregiver.  RISKS AND COMPLICATIONS  Take your time so you do not get  dizzy or light-headed.  If you are in pain, you may need to take or ask for pain medication before doing incentive spirometry. It is harder to take a deep breath if you are having pain. AFTER USE  Rest and  breathe slowly and easily.  It can be helpful to keep track of a log of your progress. Your caregiver can provide you with a simple table to help with this. If you are using the spirometer at home, follow these instructions: [place] IF:   You are having difficultly using the spirometer.  You have trouble using the spirometer as often as instructed.  Your pain medication is not giving enough relief while using the spirometer.  You develop fever of 100.5 F (38.1 C) or higher. SEEK IMMEDIATE MEDICAL CARE IF:   You cough up bloody sputum that had not been present before.  You develop fever of 102 F (38.9 C) or greater.  You develop worsening pain at or near the incision site. MAKE SURE YOU:   Understand these instructions.  Will watch your condition.  Will get help right away if you are not doing well or get worse. Document Released: 02/04/2007 Document Revised: 12/17/2011 Document Reviewed: 04/07/2007 ExitCare Patient Information 2014 Exit[employer], [place].   ________________________________________________________________________  WHAT IS A BLOOD TRANSFUSION? Blood Transfusion Information  A transfusion is the replacement of blood or some of its parts. Blood is made up of multiple cells which provide different functions.  Red blood cells carry oxygen and are used for blood loss replacement.  White blood cells fight against infection.  Platelets control bleeding.  Plasma helps clot blood.  Other blood products are available for specialized needs, such as hemophilia or other clotting disorders. BEFORE THE TRANSFUSION  Who gives blood for transfusions?   Healthy volunteers who are fully evaluated to make sure their blood is safe. This is blood bank blood. Transfusion therapy is the safest it has ever been in the practice of medicine. Before blood is taken from a donor, a complete history is taken to make sure that person has no history of diseases nor engages in  risky social behavior (examples are intravenous drug use or sexual activity with multiple partners). The donor's travel history is screened to minimize risk of transmitting infections, such as malaria. The donated blood is tested for signs of infectious diseases, such as HIV and hepatitis. The blood is then tested to be sure it is compatible with you in order to minimize the chance of a transfusion reaction. If you or a relative donates blood, this is often done in anticipation of surgery and is not appropriate for emergency situations. It takes many days to process the donated blood. RISKS AND COMPLICATIONS Although transfusion therapy is very safe and saves many lives, the main dangers of transfusion include:   Getting an infectious disease.  Developing a transfusion reaction. This is an allergic reaction to something in the blood you were given. Every precaution is taken to prevent this. The decision to have a blood transfusion has been considered carefully by your caregiver before blood is given. Blood is not given unless the benefits outweigh the risks. AFTER THE TRANSFUSION  Right after receiving a blood transfusion, you will usually feel much better and more energetic. This is especially true if your red blood cells have gotten low (anemic). The transfusion raises the level of the red blood cells which carry oxygen, and this usually causes an energy increase.  The nurse administering the transfusion will  monitor you carefully for complications. HOME CARE INSTRUCTIONS  No special instructions are needed after a transfusion. You may find your energy is better. Speak with your caregiver about any limitations on activity for underlying diseases you may have. SEEK MEDICAL CARE IF:   Your condition is not improving after your transfusion.  You develop redness or irritation at the intravenous (IV) site. SEEK IMMEDIATE MEDICAL CARE IF:  Any of the following symptoms occur over the next 12  hours:  Shaking chills.  You have a temperature by mouth above 102 F (38.9 C), not controlled by medicine.  Chest, back, or muscle pain.  People around you feel you are not acting correctly or are confused.  Shortness of breath or difficulty breathing.  Dizziness and fainting.  You get a rash or develop hives.  You have a decrease in urine output.  Your urine turns a dark color or changes to pink, red, or brown. Any of the following symptoms occur over the next 10 days:  You have a temperature by mouth above 102 F (38.9 C), not controlled by medicine.  Shortness of breath.  Weakness after normal activity.  The white part of the eye turns yellow (jaundice).  You have a decrease in the amount of urine or are urinating less often.  Your urine turns a dark color or changes to pink, red, or brown. Document Released: 09/21/2000 Document Revised: 12/17/2011 Document Reviewed: 05/10/2008 [facility] Patient Information 2014 [place], [place].  _______________________________________________________________________

## 2018-04-21 DIAGNOSIS — I10 Essential (primary) hypertension: Secondary | ICD-10-CM | POA: Diagnosis not present

## 2018-04-21 DIAGNOSIS — Z1389 Encounter for screening for other disorder: Secondary | ICD-10-CM | POA: Diagnosis not present

## 2018-04-21 DIAGNOSIS — E782 Mixed hyperlipidemia: Secondary | ICD-10-CM | POA: Diagnosis not present

## 2018-04-24 DIAGNOSIS — Z961 Presence of intraocular lens: Secondary | ICD-10-CM | POA: Diagnosis not present

## 2018-04-25 ENCOUNTER — Inpatient Hospital Stay (HOSPITAL_COMMUNITY): Admission: RE | Admit: 2018-04-25 | Payer: Medicare Other | Source: Ambulatory Visit

## 2018-04-25 ENCOUNTER — Encounter (HOSPITAL_COMMUNITY): Payer: Self-pay

## 2018-04-25 ENCOUNTER — Encounter (HOSPITAL_COMMUNITY)
Admission: RE | Admit: 2018-04-25 | Discharge: 2018-04-25 | Disposition: A | Discharge status: Home / Self Care | Payer: Medicare Other | Source: Ambulatory Visit | Attending: Orthopedic Surgery | Admitting: Orthopedic Surgery

## 2018-04-25 ENCOUNTER — Other Ambulatory Visit: Payer: Self-pay

## 2018-04-25 DIAGNOSIS — F329 Major depressive disorder, single episode, unspecified: Secondary | ICD-10-CM | POA: Diagnosis not present

## 2018-04-25 DIAGNOSIS — Z6821 Body mass index (BMI) 21.0-21.9, adult: Secondary | ICD-10-CM | POA: Diagnosis not present

## 2018-04-25 DIAGNOSIS — M1712 Unilateral primary osteoarthritis, left knee: Secondary | ICD-10-CM | POA: Diagnosis not present

## 2018-04-25 DIAGNOSIS — Z79899 Other long term (current) drug therapy: Secondary | ICD-10-CM | POA: Diagnosis not present

## 2018-04-25 DIAGNOSIS — Z01818 Encounter for other preprocedural examination: Secondary | ICD-10-CM | POA: Insufficient documentation

## 2018-04-25 DIAGNOSIS — I10 Essential (primary) hypertension: Secondary | ICD-10-CM | POA: Insufficient documentation

## 2018-04-25 DIAGNOSIS — E785 Hyperlipidemia, unspecified: Secondary | ICD-10-CM | POA: Insufficient documentation

## 2018-04-25 DIAGNOSIS — M1991 Primary osteoarthritis, unspecified site: Secondary | ICD-10-CM | POA: Diagnosis not present

## 2018-04-25 DIAGNOSIS — Z791 Long term (current) use of non-steroidal anti-inflammatories (NSAID): Secondary | ICD-10-CM | POA: Insufficient documentation

## 2018-04-25 DIAGNOSIS — K219 Gastro-esophageal reflux disease without esophagitis: Secondary | ICD-10-CM | POA: Diagnosis not present

## 2018-04-25 LAB — URINALYSIS, ROUTINE W REFLEX MICROSCOPIC
Bilirubin Urine: NEGATIVE
Glucose, UA: NEGATIVE mg/dL
HGB URINE DIPSTICK: NEGATIVE
Ketones, ur: NEGATIVE mg/dL
Leukocytes, UA: NEGATIVE
NITRITE: NEGATIVE
PROTEIN: NEGATIVE mg/dL
Specific Gravity, Urine: 1.004 — ABNORMAL LOW (ref 1.005–1.030)
pH: 7 (ref 5.0–8.0)

## 2018-04-25 LAB — CBC
HCT: 36 % (ref 36.0–46.0)
HEMOGLOBIN: 12.3 g/dL (ref 12.0–15.0)
MCH: 31.5 pg (ref 26.0–34.0)
MCHC: 34.2 g/dL (ref 30.0–36.0)
MCV: 92.1 fL (ref 78.0–100.0)
Platelets: 206 10*3/uL (ref 150–400)
RBC: 3.91 MIL/uL (ref 3.87–5.11)
RDW: 12.6 % (ref 11.5–15.5)
WBC: 6.4 10*3/uL (ref 4.0–10.5)

## 2018-04-25 LAB — COMPREHENSIVE METABOLIC PANEL
ALK PHOS: 83 U/L (ref 38–126)
ALT: 14 U/L (ref 0–44)
ANION GAP: 6 (ref 5–15)
AST: 23 U/L (ref 15–41)
Albumin: 3.5 g/dL (ref 3.5–5.0)
BUN: 9 mg/dL (ref 8–23)
CALCIUM: 9.3 mg/dL (ref 8.9–10.3)
CO2: 30 mmol/L (ref 22–32)
CREATININE: 0.68 mg/dL (ref 0.44–1.00)
Chloride: 102 mmol/L (ref 98–111)
GFR calc non Af Amer: >60 mL/min (ref >60)
Glucose, Bld: 100 mg/dL — ABNORMAL HIGH (ref 70–99)
Potassium: 5.5 mmol/L — ABNORMAL HIGH (ref 3.5–5.1)
SODIUM: 138 mmol/L (ref 135–145)
Total Bilirubin: 0.5 mg/dL (ref 0.3–1.2)
Total Protein: 6.2 g/dL — ABNORMAL LOW (ref 6.5–8.1)

## 2018-04-25 LAB — PROTIME-INR
INR: 0.98
PROTHROMBIN TIME: 12.9 s (ref 11.4–15.2)

## 2018-04-25 LAB — SURGICAL PCR SCREEN
MRSA, PCR: NEGATIVE
Staphylococcus aureus: NEGATIVE

## 2018-04-25 LAB — APTT: aPTT: 29 seconds (ref 24–36)

## 2018-04-26 LAB — ABO/RH: ABO/RH(D): O NEG

## 2018-04-30 NOTE — H&P (Signed)
TOTAL KNEE ADMISSION H&P  Patient is being admitted for left total knee arthroplasty.  Subjective:  Chief Complaint:left knee pain.  HPI: Claire West, 77 y.o. female, has a history of pain and functional disability in the left knee due to arthritis and has failed non-surgical conservative treatments for greater than 12 weeks to includeNSAID's and/or analgesics, corticosteriod injections, viscosupplementation injections, flexibility and strengthening excercises, use of assistive devices and activity modification.  Onset of symptoms was gradual, starting >10 years ago with gradually worsening course since that time. The patient noted prior procedures on the knee to include  arthroscopy and menisectomy on the left knee(s).  Patient currently rates pain in the left knee(s) at 10 out of 10 with activity. Patient has night pain, worsening of pain with activity and weight bearing, pain that interferes with activities of daily living, pain with passive range of motion, crepitus and joint swelling.  Patient has evidence of periarticular osteophytes and joint space narrowing by imaging studies. There is no active infection.  Patient Active Problem List   Diagnosis Date Noted  . Amnestic MCI (mild cognitive impairment with memory loss) 12/24/2017  . Acute metabolic encephalopathy 26/94/8546  . Altered mental status 11/06/2017  . Hyponatremia 02/12/2016  . Hypokalemia 02/12/2016  . Dehydration 02/12/2016  . Elevated LFTs 02/12/2016  . HTN (hypertension) 02/12/2016  . GERD (gastroesophageal reflux disease) 02/12/2016   Past Medical History:  Diagnosis Date  . Depression   . Gait instability    walks with cane  . GERD (gastroesophageal reflux disease)   . Hyperlipemia   . Hypertension   . Knee pain   . Mild cognitive impairment with memory loss   . Seasonal allergies     Past Surgical History:  Procedure Laterality Date  . ABDOMINAL HYSTERECTOMY    . ARTHROSCOPIC REPAIR ACL    .  COLONOSCOPY    . COLONOSCOPY N/A 11/23/2015   Procedure: COLONOSCOPY;  Surgeon: Rogene Houston, MD;  Location: [facility] SUITE;  Service: Endoscopy;  Laterality: N/A;  830  . EYE SURGERY Bilateral    Cataracts       Current Outpatient Medications  Medication Sig Dispense Refill Last Dose  . cholecalciferol (VITAMIN D) 1000 units tablet Take 1,000 Units by mouth daily at 12 noon.     . escitalopram (LEXAPRO) 10 MG tablet Take 10 mg by mouth at bedtime.  1   . fexofenadine (ALLEGRA) 180 MG tablet Take 180 mg by mouth daily.     Javier Docker Oil 500 MG CAPS Take 500 mg by mouth daily at 12 noon.     . meloxicam (MOBIC) 7.5 MG tablet Take 7.5 mg by mouth daily.     . Menthol, Topical Analgesic, (BIOFREEZE EX) Apply 1 application topically 3 (three) times daily as needed (for knee pain.).     Marland Kitchen metoprolol succinate (TOPROL-XL) 100 MG 24 hr tablet Take 100 mg by mouth 2 (two) times daily. Take with or immediately following a meal.     . Multiple Vitamin (MULTIVITAMIN WITH MINERALS) TABS tablet Take 1 tablet by mouth daily. Women's Multivitamin   01/25/2018 at Unknown time  . omeprazole (PRILOSEC) 20 MG capsule Take 20 mg by mouth daily before breakfast.    01/25/2018 at Unknown time  . potassium chloride (K-DUR) 10 MEQ tablet Take 1 tablet (10 mEq total) by mouth daily. (Patient taking differently: Take 10 mEq by mouth daily at 12 noon. ) 5 tablet 0 01/25/2018 at Unknown time  . Probiotic Product (  PROBIOTIC PO) Take 1 capsule by mouth daily. [place]     . valsartan (DIOVAN) 320 MG tablet Take 320 mg by mouth daily at 3 pm.    01/25/2018 at Unknown time   Allergies  Allergen Reactions  . Codeine Hives and Rash  . Neosporin [Neomycin-Bacitracin Zn-Polymyx] Rash  . Penicillins Rash    Oral rash/peeling Has patient had a PCN reaction causing immediate rash, facial/tongue/throat swelling, SOB or lightheadedness with hypotension: Yes Has patient had a PCN reaction causing severe rash involving mucus  membranes or skin necrosis: No Has patient had a PCN reaction that required hospitalization No Has patient had a PCN reaction occurring within the last 10 years: No If all of the above answers are "NO", then may proceed with Cephalosporin use.   . Sulfa Antibiotics Hives and Rash    Social History   Tobacco Use  . Smoking status: Never Smoker  . Smokeless tobacco: Never Used  Substance Use Topics  . Alcohol use: No    Family History  Problem Relation Age of Onset  . Lung cancer Mother   . Lung cancer Sister      Review of Systems  Constitutional: Positive for malaise/fatigue. Negative for chills, diaphoresis, fever and weight loss.  HENT: Negative.   Eyes: Negative.   Respiratory: Negative.   Cardiovascular: Negative.   Gastrointestinal: Positive for heartburn. Negative for abdominal pain, blood in stool, constipation, diarrhea, melena, nausea and vomiting.  Genitourinary: Positive for frequency. Negative for dysuria, flank pain, hematuria and urgency.       Positive for incontinence  Musculoskeletal: Positive for back pain, joint pain and myalgias. Negative for falls and neck pain.  Skin: Negative.   Neurological: Negative.   Endo/Heme/Allergies: Negative.   Psychiatric/Behavioral: Positive for depression and memory loss. Negative for hallucinations, substance abuse and suicidal ideas. The patient is nervous/anxious. The patient does not have insomnia.     Objective:  Physical Exam  Constitutional: She is oriented to person, place, and time. She appears well-developed and well-nourished. No distress.  HENT:  Head: Normocephalic and atraumatic.  Right Ear: External ear normal.  Left Ear: External ear normal.  Nose: Nose normal.  Mouth/Throat: Oropharynx is clear and moist.  Eyes: Conjunctivae and EOM are normal.  Neck: Normal range of motion. Neck supple.  Cardiovascular: Normal rate, regular rhythm and intact distal pulses.  Murmur heard.  Systolic murmur is  present with a grade of 3/6. Respiratory: Effort normal and breath sounds normal. No respiratory distress. She has no wheezes.  GI: Soft. Bowel sounds are normal. She exhibits no distension. There is no tenderness.  Musculoskeletal:  Right Knee Exam:  No effusion.  Range of motion is 5-125 degrees.  Moderate crepitus on range of motion of the knee.  Slight tenderness greater at the medial than lateral joint lines.  Stable knee.  Left Knee Exam:  No effusion.  Range of motion is 5-125 degrees.  Marked crepitus on range of motion of the knee.  Tenderness greater in the medial than lateral joint lines. Stable knee.  Bilateral Hip Exam: ROM: is normal without discomfort.  There is no tenderness over the greater trochanter.  There is no pain on provocative testing of the hip.   Neurological: She is alert and oriented to person, place, and time. She has normal strength. No sensory deficit.  Skin: No rash noted. She is not diaphoretic. No erythema.  Psychiatric: She has a normal mood and affect. Her behavior is normal.  Ht: 5 ft 4 in  Wt: 129 lbs BMI: 22.1  BP: 160/82 sitting L arm  Pulse: 76 bpm regular   Imaging Review Plain radiographs demonstrate severe degenerative joint disease of the left knee(s). The overall alignment ismild valgus. The bone quality appears to be good for age and reported activity level.   Preoperative templating of the joint replacement has been completed, documented, and submitted to the Operating Room personnel in order to optimize intra-operative equipment management.   Anticipated LOS equal to or greater than 2 midnights due to - Age 53 and older with one or more of the following:  - Obesity  - Expected need for hospital services (PT, OT, Nursing) required for safe  discharge  - Anticipated need for postoperative skilled nursing care or inpatient rehab  - Active co-morbidities: Mild cognitive impairment with memory loss OR   - Unanticipated  findings during/Post Surgery: None  - Patient is a high risk of re-admission due to: None     Assessment/Plan:  End stage primary osteoarthritis, left knee   The patient history, physical examination, clinical judgment of the provider and imaging studies are consistent with end stage degenerative joint disease of the left knee(s) and total knee arthroplasty is deemed medically necessary. The treatment options including medical management, injection therapy arthroscopy and arthroplasty were discussed at length. The risks and benefits of total knee arthroplasty were presented and reviewed. The risks due to aseptic loosening, infection, stiffness, patella tracking problems, thromboembolic complications and other imponderables were discussed. The patient acknowledged the explanation, agreed to proceed with the plan and consent was signed. Patient is being admitted for inpatient treatment for surgery, pain control, PT, OT, prophylactic antibiotics, VTE prophylaxis, progressive ambulation and ADL's and discharge planning. The patient is planning to be discharged home with outpatient therapy     Therapy Plans: outpatient therapy at [facility] Disposition: Home with family Planned DVT prophylaxis: Xarelto 10mg  daily DME needed: none PCP: Dr. Hilma Favors [facility] IV Other: cognitive impairment; sister and son help with history and medical decision making  Ardeen Jourdain, PA-C

## 2018-05-01 ENCOUNTER — Other Ambulatory Visit: Payer: Self-pay | Admitting: Orthopedic Surgery

## 2018-05-01 NOTE — Care Plan (Signed)
Ortho Bundle L TKA scheduled on 05-05-18 DCP:  Home with family.  Has 24/7 support.  2 story home with chair lift.  3 ste. DME:  No needs.  Has a RW and 3-in-1. PT:  [facility].  PT eval scheduled for 05-13-18.

## 2018-05-04 MED ORDER — BUPIVACAINE LIPOSOME 1.3 % IJ SUSP
20.0000 mL | INTRAMUSCULAR | Status: DC
  Filled 2018-05-04: qty 20

## 2018-05-05 ENCOUNTER — Inpatient Hospital Stay (HOSPITAL_COMMUNITY): Payer: Medicare Other | Admitting: Registered Nurse

## 2018-05-05 ENCOUNTER — Other Ambulatory Visit: Payer: Self-pay

## 2018-05-05 ENCOUNTER — Encounter (HOSPITAL_COMMUNITY): Payer: Self-pay | Admitting: *Deleted

## 2018-05-05 ENCOUNTER — Inpatient Hospital Stay (HOSPITAL_COMMUNITY)
Admission: RE | Admit: 2018-05-05 | Discharge: 2018-05-09 | DRG: 470 | Disposition: A | Discharge status: Skilled Nursing Facility | Payer: Medicare Other | Attending: Orthopedic Surgery | Admitting: Orthopedic Surgery

## 2018-05-05 ENCOUNTER — Encounter (HOSPITAL_COMMUNITY)
Admission: RE | Disposition: A | Discharge status: Skilled Nursing Facility | Payer: Self-pay | Source: Home / Self Care | Attending: Orthopedic Surgery

## 2018-05-05 DIAGNOSIS — M1712 Unilateral primary osteoarthritis, left knee: Secondary | ICD-10-CM | POA: Diagnosis present

## 2018-05-05 DIAGNOSIS — Z883 Allergy status to other anti-infective agents status: Secondary | ICD-10-CM

## 2018-05-05 DIAGNOSIS — Z96651 Presence of right artificial knee joint: Secondary | ICD-10-CM | POA: Diagnosis not present

## 2018-05-05 DIAGNOSIS — R41841 Cognitive communication deficit: Secondary | ICD-10-CM | POA: Diagnosis not present

## 2018-05-05 DIAGNOSIS — I10 Essential (primary) hypertension: Secondary | ICD-10-CM | POA: Diagnosis present

## 2018-05-05 DIAGNOSIS — Z885 Allergy status to narcotic agent status: Secondary | ICD-10-CM

## 2018-05-05 DIAGNOSIS — M2550 Pain in unspecified joint: Secondary | ICD-10-CM | POA: Diagnosis not present

## 2018-05-05 DIAGNOSIS — Z882 Allergy status to sulfonamides status: Secondary | ICD-10-CM

## 2018-05-05 DIAGNOSIS — E876 Hypokalemia: Secondary | ICD-10-CM | POA: Diagnosis not present

## 2018-05-05 DIAGNOSIS — Z7401 Bed confinement status: Secondary | ICD-10-CM | POA: Diagnosis not present

## 2018-05-05 DIAGNOSIS — J302 Other seasonal allergic rhinitis: Secondary | ICD-10-CM | POA: Diagnosis present

## 2018-05-05 DIAGNOSIS — Z6820 Body mass index (BMI) 20.0-20.9, adult: Secondary | ICD-10-CM | POA: Diagnosis not present

## 2018-05-05 DIAGNOSIS — M1710 Unilateral primary osteoarthritis, unspecified knee: Secondary | ICD-10-CM | POA: Diagnosis present

## 2018-05-05 DIAGNOSIS — Z79899 Other long term (current) drug therapy: Secondary | ICD-10-CM | POA: Diagnosis not present

## 2018-05-05 DIAGNOSIS — Z880 Allergy status to penicillin: Secondary | ICD-10-CM | POA: Diagnosis not present

## 2018-05-05 DIAGNOSIS — M25762 Osteophyte, left knee: Secondary | ICD-10-CM | POA: Diagnosis present

## 2018-05-05 DIAGNOSIS — K219 Gastro-esophageal reflux disease without esophagitis: Secondary | ICD-10-CM | POA: Diagnosis present

## 2018-05-05 DIAGNOSIS — R278 Other lack of coordination: Secondary | ICD-10-CM | POA: Diagnosis not present

## 2018-05-05 DIAGNOSIS — E669 Obesity, unspecified: Secondary | ICD-10-CM | POA: Diagnosis present

## 2018-05-05 DIAGNOSIS — F329 Major depressive disorder, single episode, unspecified: Secondary | ICD-10-CM | POA: Diagnosis present

## 2018-05-05 DIAGNOSIS — M179 Osteoarthritis of knee, unspecified: Secondary | ICD-10-CM | POA: Diagnosis present

## 2018-05-05 DIAGNOSIS — R2689 Other abnormalities of gait and mobility: Secondary | ICD-10-CM | POA: Diagnosis not present

## 2018-05-05 DIAGNOSIS — M6281 Muscle weakness (generalized): Secondary | ICD-10-CM | POA: Diagnosis not present

## 2018-05-05 DIAGNOSIS — M25562 Pain in left knee: Secondary | ICD-10-CM | POA: Diagnosis not present

## 2018-05-05 DIAGNOSIS — Z791 Long term (current) use of non-steroidal anti-inflammatories (NSAID): Secondary | ICD-10-CM | POA: Diagnosis not present

## 2018-05-05 DIAGNOSIS — G8918 Other acute postprocedural pain: Secondary | ICD-10-CM | POA: Diagnosis not present

## 2018-05-05 DIAGNOSIS — G3184 Mild cognitive impairment, so stated: Secondary | ICD-10-CM | POA: Diagnosis present

## 2018-05-05 DIAGNOSIS — E785 Hyperlipidemia, unspecified: Secondary | ICD-10-CM | POA: Diagnosis present

## 2018-05-05 DIAGNOSIS — F331 Major depressive disorder, recurrent, moderate: Secondary | ICD-10-CM | POA: Diagnosis not present

## 2018-05-05 HISTORY — PX: TOTAL KNEE ARTHROPLASTY: SHX125

## 2018-05-05 SURGERY — ARTHROPLASTY, KNEE, TOTAL
Anesthesia: Regional | Site: Knee | Laterality: Left

## 2018-05-05 MED ORDER — SODIUM CHLORIDE 0.9 % IJ SOLN
INTRAMUSCULAR | Status: AC
  Filled 2018-05-05: qty 10

## 2018-05-05 MED ORDER — OXYCODONE HCL 5 MG PO TABS: 5.0000 mg | ORAL_TABLET | Freq: Once | ORAL | Status: DC | PRN

## 2018-05-05 MED ORDER — SODIUM CHLORIDE 0.9 % IJ SOLN
INTRAMUSCULAR | Status: DC | PRN
  Administered 2018-05-05: 60 mL

## 2018-05-05 MED ORDER — ASPIRIN EC 325 MG PO TBEC
325.0000 mg | DELAYED_RELEASE_TABLET | Freq: Two times a day (BID) | ORAL | Status: DC
  Administered 2018-05-06 – 2018-05-09 (×7): 325 mg via ORAL
  Filled 2018-05-05 (×7): qty 1

## 2018-05-05 MED ORDER — OXYCODONE HCL 5 MG/5ML PO SOLN
5.0000 mg | Freq: Once | ORAL | Status: DC | PRN
  Filled 2018-05-05: qty 5

## 2018-05-05 MED ORDER — MENTHOL 3 MG MT LOZG: 1.0000 | LOZENGE | OROMUCOSAL | Status: DC | PRN

## 2018-05-05 MED ORDER — TRAMADOL HCL 50 MG PO TABS
50.0000 mg | ORAL_TABLET | Freq: Four times a day (QID) | ORAL | Status: DC | PRN
  Administered 2018-05-05 – 2018-05-09 (×9): 50 mg via ORAL
  Filled 2018-05-05 (×9): qty 1

## 2018-05-05 MED ORDER — VANCOMYCIN HCL IN DEXTROSE 1-5 GM/200ML-% IV SOLN
1000.0000 mg | INTRAVENOUS | Status: AC
  Administered 2018-05-05: 1000 mg via INTRAVENOUS
  Filled 2018-05-05: qty 200

## 2018-05-05 MED ORDER — PROPOFOL 10 MG/ML IV BOLUS
INTRAVENOUS | Status: AC
  Filled 2018-05-05: qty 40

## 2018-05-05 MED ORDER — BUPIVACAINE IN DEXTROSE 0.75-8.25 % IT SOLN
INTRATHECAL | Status: DC | PRN
  Administered 2018-05-05: 1.8 mL via INTRATHECAL

## 2018-05-05 MED ORDER — TRANEXAMIC ACID 1000 MG/10ML IV SOLN
1000.0000 mg | Freq: Once | INTRAVENOUS | Status: AC
  Administered 2018-05-05: 1000 mg via INTRAVENOUS
  Filled 2018-05-05: qty 1100

## 2018-05-05 MED ORDER — SODIUM CHLORIDE 0.9 % IJ SOLN
INTRAMUSCULAR | Status: AC
  Filled 2018-05-05: qty 50

## 2018-05-05 MED ORDER — ONDANSETRON HCL 4 MG/2ML IJ SOLN
4.0000 mg | Freq: Four times a day (QID) | INTRAMUSCULAR | Status: DC | PRN
Start: 2018-05-05 — End: 2018-05-09
  Administered 2018-05-05: 4 mg via INTRAVENOUS

## 2018-05-05 MED ORDER — METHOCARBAMOL 500 MG PO TABS
500.0000 mg | ORAL_TABLET | Freq: Four times a day (QID) | ORAL | Status: DC | PRN
  Administered 2018-05-05 – 2018-05-08 (×5): 500 mg via ORAL
  Filled 2018-05-05 (×5): qty 1

## 2018-05-05 MED ORDER — CHLORHEXIDINE GLUCONATE 4 % EX LIQD: 60.0000 mL | Freq: Once | CUTANEOUS | Status: DC

## 2018-05-05 MED ORDER — SODIUM CHLORIDE 0.9 % IV SOLN
INTRAVENOUS | Status: DC
  Administered 2018-05-05 – 2018-05-06 (×2): via INTRAVENOUS

## 2018-05-05 MED ORDER — PROPOFOL 500 MG/50ML IV EMUL
INTRAVENOUS | Status: DC | PRN
  Administered 2018-05-05: 40 ug/kg/min via INTRAVENOUS

## 2018-05-05 MED ORDER — ONDANSETRON HCL 4 MG PO TABS: 4.0000 mg | ORAL_TABLET | Freq: Four times a day (QID) | ORAL | Status: DC | PRN

## 2018-05-05 MED ORDER — PROMETHAZINE HCL 25 MG/ML IJ SOLN: 6.2500 mg | INTRAMUSCULAR | Status: DC | PRN

## 2018-05-05 MED ORDER — POLYETHYLENE GLYCOL 3350 17 G PO PACK: 17.0000 g | PACK | Freq: Every day | ORAL | Status: DC | PRN

## 2018-05-05 MED ORDER — FENTANYL CITRATE (PF) 100 MCG/2ML IJ SOLN
50.0000 ug | INTRAMUSCULAR | Status: DC
  Administered 2018-05-05: 50 ug via INTRAVENOUS
  Filled 2018-05-05: qty 2

## 2018-05-05 MED ORDER — HYDROMORPHONE HCL 1 MG/ML IJ SOLN
0.5000 mg | INTRAMUSCULAR | Status: DC | PRN
  Administered 2018-05-05: 1 mg via INTRAVENOUS
  Filled 2018-05-05: qty 1

## 2018-05-05 MED ORDER — LACTATED RINGERS IV SOLN
INTRAVENOUS | Status: DC
  Administered 2018-05-05: 08:00:00 via INTRAVENOUS

## 2018-05-05 MED ORDER — ROPIVACAINE HCL 5 MG/ML IJ SOLN
INTRAMUSCULAR | Status: DC | PRN
  Administered 2018-05-05: 20 mL via PERINEURAL

## 2018-05-05 MED ORDER — PANTOPRAZOLE SODIUM 40 MG PO TBEC
40.0000 mg | DELAYED_RELEASE_TABLET | Freq: Every day | ORAL | Status: DC
  Administered 2018-05-06 – 2018-05-09 (×4): 40 mg via ORAL
  Filled 2018-05-05 (×4): qty 1

## 2018-05-05 MED ORDER — POTASSIUM CHLORIDE ER 10 MEQ PO TBCR
10.0000 meq | EXTENDED_RELEASE_TABLET | Freq: Every day | ORAL | Status: DC
  Administered 2018-05-05 – 2018-05-09 (×5): 10 meq via ORAL
  Filled 2018-05-05 (×10): qty 1

## 2018-05-05 MED ORDER — VANCOMYCIN HCL IN DEXTROSE 1-5 GM/200ML-% IV SOLN
1000.0000 mg | Freq: Two times a day (BID) | INTRAVENOUS | Status: AC
  Administered 2018-05-05: 1000 mg via INTRAVENOUS
  Filled 2018-05-05: qty 200

## 2018-05-05 MED ORDER — ACETAMINOPHEN 500 MG PO TABS
1000.0000 mg | ORAL_TABLET | Freq: Four times a day (QID) | ORAL | Status: AC
  Administered 2018-05-05 (×2): 1000 mg via ORAL
  Administered 2018-05-06: 500 mg via ORAL
  Administered 2018-05-06: 1000 mg via ORAL
  Filled 2018-05-05 (×4): qty 2

## 2018-05-05 MED ORDER — BISACODYL 10 MG RE SUPP: 10.0000 mg | Freq: Every day | RECTAL | Status: DC | PRN

## 2018-05-05 MED ORDER — DIPHENHYDRAMINE HCL 12.5 MG/5ML PO ELIX: 12.5000 mg | ORAL_SOLUTION | ORAL | Status: DC | PRN

## 2018-05-05 MED ORDER — DEXAMETHASONE SODIUM PHOSPHATE 10 MG/ML IJ SOLN
8.0000 mg | Freq: Once | INTRAMUSCULAR | Status: AC
  Administered 2018-05-05: 8 mg via INTRAVENOUS

## 2018-05-05 MED ORDER — DEXAMETHASONE SODIUM PHOSPHATE 10 MG/ML IJ SOLN
INTRAMUSCULAR | Status: AC
  Filled 2018-05-05: qty 1

## 2018-05-05 MED ORDER — FLEET ENEMA 7-19 GM/118ML RE ENEM: 1.0000 | ENEMA | Freq: Once | RECTAL | Status: DC | PRN

## 2018-05-05 MED ORDER — METOCLOPRAMIDE HCL 5 MG PO TABS: 5.0000 mg | ORAL_TABLET | Freq: Three times a day (TID) | ORAL | Status: DC | PRN

## 2018-05-05 MED ORDER — IRBESARTAN 150 MG PO TABS
300.0000 mg | ORAL_TABLET | Freq: Every day | ORAL | Status: DC
  Administered 2018-05-05 – 2018-05-09 (×5): 300 mg via ORAL
  Filled 2018-05-05 (×5): qty 2

## 2018-05-05 MED ORDER — ONDANSETRON HCL 4 MG/2ML IJ SOLN
INTRAMUSCULAR | Status: DC | PRN
  Administered 2018-05-05: 4 mg via INTRAVENOUS

## 2018-05-05 MED ORDER — METHOCARBAMOL 1000 MG/10ML IJ SOLN
500.0000 mg | Freq: Four times a day (QID) | INTRAVENOUS | Status: DC | PRN
  Filled 2018-05-05: qty 5

## 2018-05-05 MED ORDER — ESCITALOPRAM OXALATE 10 MG PO TABS
10.0000 mg | ORAL_TABLET | Freq: Every day | ORAL | Status: DC
  Administered 2018-05-05 – 2018-05-08 (×4): 10 mg via ORAL
  Filled 2018-05-05 (×4): qty 1

## 2018-05-05 MED ORDER — ONDANSETRON HCL 4 MG/2ML IJ SOLN
INTRAMUSCULAR | Status: AC
  Filled 2018-05-05: qty 2

## 2018-05-05 MED ORDER — METOPROLOL SUCCINATE ER 50 MG PO TB24
100.0000 mg | ORAL_TABLET | Freq: Two times a day (BID) | ORAL | Status: DC
  Administered 2018-05-05 – 2018-05-09 (×8): 100 mg via ORAL
  Filled 2018-05-05 (×9): qty 2

## 2018-05-05 MED ORDER — HYDROMORPHONE HCL 2 MG PO TABS
2.0000 mg | ORAL_TABLET | ORAL | Status: DC | PRN
  Administered 2018-05-06 (×2): 2 mg via ORAL
  Filled 2018-05-05 (×3): qty 1

## 2018-05-05 MED ORDER — DOCUSATE SODIUM 100 MG PO CAPS
100.0000 mg | ORAL_CAPSULE | Freq: Two times a day (BID) | ORAL | Status: DC
  Administered 2018-05-05 – 2018-05-09 (×9): 100 mg via ORAL
  Filled 2018-05-05 (×10): qty 1

## 2018-05-05 MED ORDER — SODIUM CHLORIDE 0.9 % IR SOLN
Status: DC | PRN
  Administered 2018-05-05: 1000 mL

## 2018-05-05 MED ORDER — DEXAMETHASONE SODIUM PHOSPHATE 10 MG/ML IJ SOLN
10.0000 mg | Freq: Once | INTRAMUSCULAR | Status: AC
  Administered 2018-05-06: 10 mg via INTRAVENOUS
  Filled 2018-05-05: qty 1

## 2018-05-05 MED ORDER — BUPIVACAINE LIPOSOME 1.3 % IJ SUSP
INTRAMUSCULAR | Status: DC | PRN
  Administered 2018-05-05: 20 mL

## 2018-05-05 MED ORDER — METOCLOPRAMIDE HCL 5 MG/ML IJ SOLN
5.0000 mg | Freq: Three times a day (TID) | INTRAMUSCULAR | Status: DC | PRN
  Administered 2018-05-05: 10 mg via INTRAVENOUS
  Filled 2018-05-05: qty 2

## 2018-05-05 MED ORDER — MIDAZOLAM HCL 2 MG/2ML IJ SOLN
1.0000 mg | INTRAMUSCULAR | Status: DC
  Administered 2018-05-05: 1 mg via INTRAVENOUS
  Filled 2018-05-05: qty 2

## 2018-05-05 MED ORDER — PHENOL 1.4 % MT LIQD
1.0000 | OROMUCOSAL | Status: DC | PRN
  Filled 2018-05-05: qty 177

## 2018-05-05 MED ORDER — LORATADINE 10 MG PO TABS
10.0000 mg | ORAL_TABLET | Freq: Every day | ORAL | Status: DC
  Administered 2018-05-06 – 2018-05-09 (×4): 10 mg via ORAL
  Filled 2018-05-05 (×4): qty 1

## 2018-05-05 MED ORDER — STERILE WATER FOR IRRIGATION IR SOLN
Status: DC | PRN
  Administered 2018-05-05: 2000 mL

## 2018-05-05 MED ORDER — TRANEXAMIC ACID 1000 MG/10ML IV SOLN
1000.0000 mg | INTRAVENOUS | Status: AC
  Administered 2018-05-05: 1000 mg via INTRAVENOUS
  Filled 2018-05-05: qty 10

## 2018-05-05 MED ORDER — 0.9 % SODIUM CHLORIDE (POUR BTL) OPTIME
TOPICAL | Status: DC | PRN
  Administered 2018-05-05: 1000 mL

## 2018-05-05 MED ORDER — ACETAMINOPHEN 10 MG/ML IV SOLN
1000.0000 mg | Freq: Four times a day (QID) | INTRAVENOUS | Status: DC
  Administered 2018-05-05: 1000 mg via INTRAVENOUS
  Filled 2018-05-05: qty 100

## 2018-05-05 MED ORDER — HYDROMORPHONE HCL 1 MG/ML IJ SOLN: 0.2500 mg | INTRAMUSCULAR | Status: DC | PRN

## 2018-05-05 SURGICAL SUPPLY — 51 items
BAG SPEC THK2 15X12 ZIP CLS (MISCELLANEOUS) ×1
BAG ZIPLOCK 12X15 (MISCELLANEOUS) ×3 IMPLANT
BANDAGE ACE 6X5 VEL STRL LF (GAUZE/BANDAGES/DRESSINGS) ×3 IMPLANT
BLADE SAG 18X100X1.27 (BLADE) ×3 IMPLANT
BLADE SAW SGTL 11.0X1.19X90.0M (BLADE) ×3 IMPLANT
BOWL SMART MIX CTS (DISPOSABLE) ×3 IMPLANT
CAP KNEE TOTAL 3 SIGMA ×2 IMPLANT
CEMENT HV SMART SET (Cement) ×6 IMPLANT
CLOSURE WOUND 1/2 X4 (GAUZE/BANDAGES/DRESSINGS) ×2
COVER SURGICAL LIGHT HANDLE (MISCELLANEOUS) ×3 IMPLANT
CUFF TOURN SGL QUICK 34 (TOURNIQUET CUFF) ×3
CUFF TRNQT CYL 34X4X40X1 (TOURNIQUET CUFF) ×1 IMPLANT
DECANTER SPIKE VIAL GLASS SM (MISCELLANEOUS) ×3 IMPLANT
DRAPE U-SHAPE 47X51 STRL (DRAPES) ×3 IMPLANT
DRSG ADAPTIC 3X8 NADH LF (GAUZE/BANDAGES/DRESSINGS) ×3 IMPLANT
DRSG PAD ABDOMINAL 8X10 ST (GAUZE/BANDAGES/DRESSINGS) ×3 IMPLANT
DURAPREP 26ML APPLICATOR (WOUND CARE) ×3 IMPLANT
ELECT REM PT RETURN 15FT ADLT (MISCELLANEOUS) ×3 IMPLANT
EVACUATOR 1/8 PVC DRAIN (DRAIN) ×3 IMPLANT
GAUZE SPONGE 4X4 12PLY STRL (GAUZE/BANDAGES/DRESSINGS) ×3 IMPLANT
GLOVE BIO SURGEON STRL SZ7 (GLOVE) ×3 IMPLANT
GLOVE BIO SURGEON STRL SZ8 (GLOVE) ×4 IMPLANT
GLOVE BIOGEL PI IND STRL 6.5 (GLOVE) ×1 IMPLANT
GLOVE BIOGEL PI IND STRL 7.0 (GLOVE) ×2 IMPLANT
GLOVE BIOGEL PI IND STRL 8 (GLOVE) ×1 IMPLANT
GLOVE BIOGEL PI INDICATOR 6.5 (GLOVE)
GLOVE BIOGEL PI INDICATOR 7.0 (GLOVE) ×2
GLOVE BIOGEL PI INDICATOR 8 (GLOVE) ×2
GLOVE SURG SS PI 6.5 STRL IVOR (GLOVE) ×1 IMPLANT
GOWN STRL REUS W/TWL LRG LVL3 (GOWN DISPOSABLE) ×6 IMPLANT
HANDPIECE INTERPULSE COAX TIP (DISPOSABLE) ×3
HOLDER FOLEY CATH W/STRAP (MISCELLANEOUS) IMPLANT
IMMOBILIZER KNEE 20 (SOFTGOODS) ×3
IMMOBILIZER KNEE 20 THIGH 36 (SOFTGOODS) ×1 IMPLANT
MANIFOLD NEPTUNE II (INSTRUMENTS) ×3 IMPLANT
NS IRRIG 1000ML POUR BTL (IV SOLUTION) ×3 IMPLANT
PACK TOTAL KNEE CUSTOM (KITS) ×3 IMPLANT
PADDING CAST COTTON 6X4 STRL (CAST SUPPLIES) ×7 IMPLANT
POSITIONER SURGICAL ARM (MISCELLANEOUS) ×3 IMPLANT
SET HNDPC FAN SPRY TIP SCT (DISPOSABLE) ×1 IMPLANT
STRIP CLOSURE SKIN 1/2X4 (GAUZE/BANDAGES/DRESSINGS) ×4 IMPLANT
SUT MNCRL AB 4-0 PS2 18 (SUTURE) ×3 IMPLANT
SUT STRATAFIX 0 PDS 27 VIOLET (SUTURE) ×3
SUT VIC AB 2-0 CT1 27 (SUTURE) ×6
SUT VIC AB 2-0 CT1 TAPERPNT 27 (SUTURE) ×3 IMPLANT
SUTURE STRATFX 0 PDS 27 VIOLET (SUTURE) ×1 IMPLANT
TRAY FOLEY CATH 14FRSI W/METER (CATHETERS) ×2 IMPLANT
TRAY FOLEY MTR SLVR 16FR STAT (SET/KITS/TRAYS/PACK) ×1 IMPLANT
WATER STERILE IRR 1000ML POUR (IV SOLUTION) ×6 IMPLANT
WRAP KNEE MAXI GEL POST OP (GAUZE/BANDAGES/DRESSINGS) ×3 IMPLANT
YANKAUER SUCT BULB TIP 10FT TU (MISCELLANEOUS) ×3 IMPLANT

## 2018-05-05 NOTE — Plan of Care (Signed)
Plan of care discussed with patient 

## 2018-05-05 NOTE — Anesthesia Procedure Notes (Signed)
Anesthesia Regional Block: Adductor canal block   Pre-Anesthetic Checklist: ,, timeout performed, Correct Patient, Correct Site, Correct Laterality, Correct Procedure, Correct Position, site marked, Risks and benefits discussed,  Surgical consent,  Pre-op evaluation,  At surgeon's request and post-op pain management  Laterality: Left  Prep: chloraprep       Needles:  Injection technique: Single-shot  Needle Type: Stimiplex     Needle Length: 9cm  Needle Gauge: 21     Additional Needles:   Procedures:,,,, ultrasound used (permanent image in chart),,,,  Narrative:  Start time: 05/05/2018 8:24 AM End time: 05/05/2018 8:29 AM Injection made incrementally with aspirations every 5 mL.  Performed by: Personally  Anesthesiologist: Lynda Rainwater, MD

## 2018-05-05 NOTE — Evaluation (Signed)
Physical Therapy Evaluation Patient Details Name: Claire West MRN: [id number] DOB: May 07, 1941 Today's Date: 05/05/2018   History of Present Illness  s/p L TKA  Clinical Impression  Pt is s/p TKA resulting in the deficits listed below (see PT Problem List).  Pt able to dangle at  EOB briefly, unable to stand d/t pain and nausea, RN notified; BP 153/80; initiated HEP and will continue to follow; pt has 24hr care at home, OPPT set up for August 6th per pt family  Pt will benefit from skilled PT to increase their independence and safety with mobility to allow discharge to the venue listed below.      Follow Up Recommendations Outpatient PT;Follow surgeon's recommendation for DC plan and follow-up therapies    Equipment Recommendations  None recommended by PT    Recommendations for Other Services       Precautions / Restrictions Precautions Precautions: Knee Required Braces or Orthoses: Knee Immobilizer - Left Knee Immobilizer - Left: Discontinue once straight leg raise with < 10 degree lag Restrictions Weight Bearing Restrictions: No Other Position/Activity Restrictions: WBAT      Mobility  Bed Mobility Overal bed mobility: Needs Assistance Bed Mobility: Supine to Sit;Sit to Supine     Supine to sit: Mod assist;+2 for safety/equipment Sit to supine: Mod assist;+2 for safety/equipment   General bed mobility comments: cues for technique, assist with trunk and LEs  Transfers     Transfers: Sit to/from Stand           General transfer comment: unable d/t pain/dizziness/nausea  Ambulation/Gait                Stairs            Wheelchair Mobility    Modified Rankin (Stroke Patients Only)       Balance Overall balance assessment: Needs assistance;History of Falls Sitting-balance support: Bilateral upper extremity supported;Feet supported Sitting balance-Leahy Scale: Fair Sitting balance - Comments: reliant on UEs                                      Pertinent Vitals/Pain Pain Assessment: 0-10 Pain Score: 10-Worst pain ever Pain Location: LLE  Pain Descriptors / Indicators: Aching;Grimacing;Sore Pain Intervention(s): Limited activity within patient's tolerance;Monitored during session;Premedicated before session;Repositioned    Home Living Family/patient expects to be discharged to:: Private residence Living Arrangements: Children Available Help at Discharge: Available 24 hours/day Type of Home: House Home Access: Stairs to enter Entrance Stairs-Rails: Right;Left;Can reach both Entrance Stairs-Number of Steps: 2-3 and 2-3 more to living room--pt has twin bed in living room so she can temporatily stay on main floor Home Layout: Two level Home Equipment: Cane - single point;Walker - 2 wheels;Shower seat;Bedside commode;Wheelchair - manual      Prior Function                 Hand Dominance        Extremity/Trunk Assessment        Lower Extremity Assessment Lower Extremity Assessment: LLE deficits/detail LLE Deficits / Details: knee flexion AAROM -8* to 40*; knee extension with hip flexion 2+/5; ankle WFL       Communication      Cognition Arousal/Alertness: Awake/alert Behavior During Therapy: WFL for tasks assessed/performed Overall Cognitive Status: Within Functional Limits for tasks assessed  General Comments      Exercises Total Joint Exercises Ankle Circles/Pumps: AROM;Both;10 reps Quad Sets: AROM;Both;5 reps Heel Slides: AAROM;Left;5 reps   Assessment/Plan    PT Assessment Patient needs continued PT services  PT Problem List Decreased strength;Decreased range of motion;Decreased mobility;Decreased activity tolerance;Pain;Decreased knowledge of use of DME       PT Treatment Interventions DME instruction;Gait training;Therapeutic activities;Therapeutic exercise;Patient/family education;Stair training;Functional  mobility training    PT Goals (Current goals can be found in the Care Plan section)  Acute Rehab PT Goals Patient Stated Goal: move around better PT Goal Formulation: With patient Time For Goal Achievement: 05/12/18 Potential to Achieve Goals: Good    Frequency 7X/week   Barriers to discharge        Co-evaluation               AM-PAC PT "6 Clicks" Daily Activity  Outcome Measure Difficulty turning over in bed (including adjusting bedclothes, sheets and blankets)?: Unable Difficulty moving from lying on back to sitting on the side of the bed? : Unable Difficulty sitting down on and standing up from a chair with arms (e.g., wheelchair, bedside commode, etc,.)?: Unable Help needed moving to and from a bed to chair (including a wheelchair)?: A Lot Help needed walking in hospital room?: A Lot Help needed climbing 3-5 steps with a railing? : A Lot 6 Click Score: 9    End of Session   Activity Tolerance: Treatment limited secondary to medical complications (Comment);Patient limited by pain(nausea) Patient left: in bed;with call bell/phone within reach;with family/visitor present;with bed alarm set   PT Visit Diagnosis: Difficulty in walking, not elsewhere classified (R26.2)    Time: 8466-5993 PT Time Calculation (min) (ACUTE ONLY): 21 min   Charges:   PT Evaluation $PT Eval Low Complexity: 1 Low            Ocie Stanzione 05/05/2018, 4:28 PM

## 2018-05-05 NOTE — Op Note (Signed)
OPERATIVE REPORT-TOTAL KNEE ARTHROPLASTY   Pre-operative diagnosis- Osteoarthritis  Left knee(s)  Post-operative diagnosis- Osteoarthritis Left knee(s)  Procedure-  Left  Total Knee Arthroplasty  Surgeon- Claire West. Claire Sedlack, MD  Assistant- Theresa Duty, PA-C   Anesthesia-  Adductor canal block and spinal  EBL-50 mL   Drains Hemovac  Tourniquet time-  Total Tourniquet Time Documented: Thigh (Left) - 34 minutes Total: Thigh (Left) - 34 minutes     Complications- None  Condition-PACU - hemodynamically stable.   Brief Clinical Note  Claire West is a 77 y.o. year old female with end stage OA of her left knee with progressively worsening pain and dysfunction. She has constant pain, with activity and at rest and significant functional deficits with difficulties even with ADLs. She has had extensive non-op management including analgesics, injections of cortisone and viscosupplements, and home exercise program, but remains in significant pain with significant dysfunction. Radiographs show bone on bone arthritis lateral and patellofemoral. She presents now for left Total Knee Arthroplasty.    Procedure in detail---   The patient is brought into the operating room and positioned supine on the operating table. After successful administration of  Adductor canal block and spinal,   a tourniquet is placed high on the  Left thigh(s) and the lower extremity is prepped and draped in the usual sterile fashion. Time out is performed by the operating team and then the  Left lower extremity is wrapped in Esmarch, knee flexed and the tourniquet inflated to 300 mmHg.       A midline incision is made with a ten blade through the subcutaneous tissue to the level of the extensor mechanism. A fresh blade is used to make a medial parapatellar arthrotomy. Soft tissue over the proximal medial tibia is subperiosteally elevated to the joint line with a knife and into the semimembranosus bursa with a Cobb  elevator. Soft tissue over the proximal lateral tibia is elevated with attention being paid to avoiding the patellar tendon on the tibial tubercle. The patella is everted, knee flexed 90 degrees and the ACL and PCL are removed. Findings are bone on bone lateral and patellofemoral with large global osteophytes.        The drill is used to create a starting hole in the distal femur and the canal is thoroughly irrigated with sterile saline to remove the fatty contents. The 5 degree Left  valgus alignment guide is placed into the femoral canal and the distal femoral cutting block is pinned to remove 10 mm off the distal femur. Resection is made with an oscillating saw.      The tibia is subluxed forward and the menisci are removed. The extramedullary alignment guide is placed referencing proximally at the medial aspect of the tibial tubercle and distally along the second metatarsal axis and tibial crest. The block is pinned to remove 45mm off the more deficient lateral  side. Resection is made with an oscillating saw. Size 3is the most appropriate size for the tibia and the proximal tibia is prepared with the modular drill and keel punch for that size.      The femoral sizing guide is placed and size 3 is most appropriate. Rotation is marked off the epicondylar axis and confirmed by creating a rectangular flexion gap at 90 degrees. The size 3 cutting block is pinned in this rotation and the anterior, posterior and chamfer cuts are made with the oscillating saw. The intercondylar block is then placed and that cut is made.  Trial size 3 tibial component, trial size 3 posterior stabilized femur and a 12.5  mm posterior stabilized rotating platform insert trial is placed. Full extension is achieved with excellent varus/valgus and anterior/posterior balance throughout full range of motion. The patella is everted and thickness measured to be 22  mm. Free hand resection is taken to 12 mm, a 35 template is placed, lug  holes are drilled, trial patella is placed, and it tracks normally. Osteophytes are removed off the posterior femur with the trial in place. All trials are removed and the cut bone surfaces prepared with pulsatile lavage. Cement is mixed and once ready for implantation, the size 3 tibial implant, size  3 posterior stabilized femoral component, and the size 35 patella are cemented in place and the patella is held with the clamp. The trial insert is placed and the knee held in full extension. The Exparel (20 ml mixed with 60 ml saline) is injected into the extensor mechanism, posterior capsule, medial and lateral gutters and subcutaneous tissues.  All extruded cement is removed and once the cement is hard the permanent 12.5 mm posterior stabilized rotating platform insert is placed into the tibial tray.      The wound is copiously irrigated with saline solution and the extensor mechanism closed over a hemovac drain with #1 V-loc suture. The tourniquet is released for a total tourniquet time of 34  minutes. Flexion against gravity is 140 degrees and the patella tracks normally. Subcutaneous tissue is closed with 2.0 vicryl and subcuticular with running 4.0 Monocryl. The incision is cleaned and dried and steri-strips and a bulky sterile dressing are applied. The limb is placed into a knee immobilizer and the patient is awakened and transported to recovery in stable condition.      Please note that a surgical assistant was a medical necessity for this procedure in order to perform it in a safe and expeditious manner. Surgical assistant was necessary to retract the ligaments and vital neurovascular structures to prevent injury to them and also necessary for proper positioning of the limb to allow for anatomic placement of the prosthesis.   Claire West Claire Busk, MD    05/05/2018, 10:19 AM

## 2018-05-05 NOTE — Interval H&P Note (Signed)
History and Physical Interval Note:  05/05/2018 7:14 AM  Claire West  has presented today for surgery, with the diagnosis of Osteoarthritis Left Knee  The various methods of treatment have been discussed with the patient and family. After consideration of risks, benefits and other options for treatment, the patient has consented to  Procedure(s): LEFT TOTAL KNEE ARTHROPLASTY (Left) as a surgical intervention .  The patient's history has been reviewed, patient examined, no change in status, stable for surgery.  I have reviewed the patient's chart and labs.  Questions were answered to the patient's satisfaction.     Pilar Plate [name]

## 2018-05-05 NOTE — Progress Notes (Signed)
Care Plan Notes 02/04/2018 to 05/05/2018       Care Plan by Leonides Grills A at 05/01/2018  3:39 PM    Date of Service   Author Author Type Status Note Type File Time  05/01/2018  Claire West  Signed Care Plan 05/01/2018             Ortho Bundle L TKA scheduled on 05-05-18 DCP:  Home with family.  Has 24/7 support.  2 story home with chair lift.  3 ste. DME:  No needs.  Has a RW and 3-in-1. PT:  [facility].  PT eval scheduled for 05-13-18.

## 2018-05-05 NOTE — Anesthesia Preprocedure Evaluation (Signed)
Anesthesia Evaluation  Patient identified by MRN, date of birth, ID band Patient awake    Reviewed: Allergy & Precautions, NPO status , Patient's Chart, lab work & pertinent test results, reviewed documented beta blocker date and time   Airway Mallampati: II  TM Distance: >3 FB Neck ROM: Full    Dental no notable dental hx.    Pulmonary neg pulmonary ROS,    Pulmonary exam normal breath sounds clear to auscultation       Cardiovascular hypertension, Pt. on medications and Pt. on home beta blockers negative cardio ROS Normal cardiovascular exam Rhythm:Regular Rate:Normal     Neuro/Psych Depression negative neurological ROS  negative psych ROS   GI/Hepatic negative GI ROS, Neg liver ROS, GERD  ,  Endo/Other  negative endocrine ROS  Renal/GU negative Renal ROS  negative genitourinary   Musculoskeletal negative musculoskeletal ROS (+) Arthritis , Osteoarthritis,    Abdominal   Peds negative pediatric ROS (+)  Hematology negative hematology ROS (+)   Anesthesia Other Findings   Reproductive/Obstetrics negative OB ROS                             Anesthesia Physical Anesthesia Plan  ASA: II  Anesthesia Plan: Spinal and Regional   Post-op Pain Management:  Regional for Post-op pain   Induction: Intravenous  PONV Risk Score and Plan: 2 and Treatment may vary due to age or medical condition, Ondansetron and Midazolam  Airway Management Planned: Simple Face Mask  Additional Equipment:   Intra-op Plan:   Post-operative Plan:   Informed Consent: I have reviewed the patients History and Physical, chart, labs and discussed the procedure including the risks, benefits and alternatives for the proposed anesthesia with the patient or authorized representative who has indicated his/her understanding and acceptance.   Dental advisory given  Plan Discussed with: CRNA  Anesthesia Plan  Comments:         Anesthesia Quick Evaluation

## 2018-05-05 NOTE — Anesthesia Postprocedure Evaluation (Signed)
Anesthesia Post Note  Patient: Claire West  Procedure(s) Performed: LEFT TOTAL KNEE ARTHROPLASTY (Left Knee)     Patient location during evaluation: PACU Anesthesia Type: Spinal Level of consciousness: oriented and awake and alert Pain management: pain level controlled Vital Signs Assessment: post-procedure vital signs reviewed and stable Respiratory status: spontaneous breathing and respiratory function stable Cardiovascular status: blood pressure returned to baseline and stable Postop Assessment: no headache, no backache and no apparent nausea or vomiting Anesthetic complications: no    Last Vitals:  Vitals:   05/05/18 1215 05/05/18 1225  BP: (!) 173/80 (!) 180/84  Pulse: 66 (!) 59  Resp: 12 16  Temp: (!) 36.4 C (!) 36.3 C  SpO2: 96% 98%    Last Pain:  Vitals:   05/05/18 1246  TempSrc:   PainSc: 0-No pain                 Lynda Rainwater

## 2018-05-05 NOTE — Anesthesia Procedure Notes (Signed)
Spinal  Start time: 05/05/2018 9:19 AM End time: 05/05/2018 9:23 AM Staffing Resident/CRNA: Victoriano Lain, CRNA Preanesthetic Checklist Completed: patient identified, site marked, surgical consent, pre-op evaluation, timeout performed, IV checked, risks and benefits discussed and monitors and equipment checked Spinal Block Patient position: sitting Prep: site prepped and draped and DuraPrep Patient monitoring: heart rate, continuous pulse ox and blood pressure Approach: midline Location: L3-4 Injection technique: single-shot Needle Needle type: Pencan  Needle gauge: 24 G Needle length: 10 cm Assessment Sensory level: T4 Additional Notes Pt placed in sitting position for spinal placement. Spinal kit expiration date checked and verified. One attempt by CRNA. - heme. + CSF. Pt tolerated well. Placed supine after spinal placement.

## 2018-05-05 NOTE — Transfer of Care (Signed)
Immediate Anesthesia Transfer of Care Note  Patient: Claire West  Procedure(s) Performed: LEFT TOTAL KNEE ARTHROPLASTY (Left Knee)  Patient Location: PACU  Anesthesia Type:Spinal and MAC combined with regional for post-op pain  Level of Consciousness: awake, oriented and patient cooperative  Airway & Oxygen Therapy: Patient Spontanous Breathing and Patient connected to face mask oxygen  Post-op Assessment: Report given to RN and Post -op Vital signs reviewed and stable  Post vital signs: Reviewed and stable  Last Vitals:  Vitals Value Taken Time  BP 147/84 05/05/2018 10:50 AM  Temp    Pulse 63 05/05/2018 10:54 AM  Resp 11 05/05/2018 10:54 AM  SpO2 100 % 05/05/2018 10:54 AM  Vitals shown include unvalidated device data.  Last Pain:  Vitals:   05/05/18 0857  TempSrc:   PainSc: 0-No pain      Patients Stated Pain Goal: 4 (42/87/68 1157)  Complications: No apparent anesthesia complications

## 2018-05-05 NOTE — Progress Notes (Signed)
Assisted Dr. Miller with left, ultrasound guided, adductor canal block. Side rails up, monitors on throughout procedure. See vital signs in flow sheet. Tolerated Procedure well.  

## 2018-05-06 ENCOUNTER — Encounter (HOSPITAL_COMMUNITY): Payer: Self-pay | Admitting: Orthopedic Surgery

## 2018-05-06 LAB — BASIC METABOLIC PANEL
ANION GAP: 8 (ref 5–15)
BUN: 14 mg/dL (ref 8–23)
CALCIUM: 8.8 mg/dL — AB (ref 8.9–10.3)
CO2: 26 mmol/L (ref 22–32)
Chloride: 100 mmol/L (ref 98–111)
Creatinine, Ser: 0.61 mg/dL (ref 0.44–1.00)
Glucose, Bld: 136 mg/dL — ABNORMAL HIGH (ref 70–99)
POTASSIUM: 4.2 mmol/L (ref 3.5–5.1)
Sodium: 134 mmol/L — ABNORMAL LOW (ref 135–145)

## 2018-05-06 LAB — CBC
HCT: 25.7 % — ABNORMAL LOW (ref 36.0–46.0)
Hemoglobin: 8.9 g/dL — ABNORMAL LOW (ref 12.0–15.0)
MCH: 31.3 pg (ref 26.0–34.0)
MCHC: 34.6 g/dL (ref 30.0–36.0)
MCV: 90.5 fL (ref 78.0–100.0)
Platelets: 164 10*3/uL (ref 150–400)
RBC: 2.84 MIL/uL — ABNORMAL LOW (ref 3.87–5.11)
RDW: 12.6 % (ref 11.5–15.5)
WBC: 12.8 10*3/uL — ABNORMAL HIGH (ref 4.0–10.5)

## 2018-05-06 NOTE — Progress Notes (Signed)
Care Plan Notes 02/05/2018 to 05/06/2018       Care Plan by Leonides Grills A at 05/01/2018  3:39 PM    Date of Service   Author Author Type Status Note Type File Time  05/01/2018  Claire West  Signed Care Plan 05/01/2018             Ortho Bundle L TKA scheduled on 05-05-18 DCP:  Home with family.  Has 24/7 support.  2 story home with chair lift.  3 ste. DME:  No needs.  Has a RW and 3-in-1. PT:  [facility].  PT eval scheduled for 05-13-18.

## 2018-05-06 NOTE — Progress Notes (Signed)
Physical Therapy Treatment Patient Details Name: Claire West MRN: [id number] DOB: 18-Sep-1941 Today's Date: 05/06/2018    History of Present Illness s/p L TKA    PT Comments    POD # 1 pm session Pt required increased assist for mobility.  Unable to take functional steps required tactile pushing and therapist to advance walker.  Family concerned about poss D/C tomorrow.  "How are we going to get her out of the car and up 3 steps?".    Follow Up Recommendations  Outpatient PT;Follow surgeon's recommendation for DC plan and follow-up therapies(OP may be difficult due to pt's cognition and mobility status)     Equipment Recommendations  None recommended by PT    Recommendations for Other Services       Precautions / Restrictions Precautions Precautions: Knee Precaution Comments: applied KI and instructed familly on use  Required Braces or Orthoses: Knee Immobilizer - Left Knee Immobilizer - Left: Discontinue once straight leg raise with < 10 degree lag Restrictions Weight Bearing Restrictions: No Other Position/Activity Restrictions: WBAT    Mobility  Bed Mobility Overal bed mobility: Needs Assistance Bed Mobility: Sit to Supine     Supine to sit: Mod assist;Max assist Sit to supine: Total assist;Max assist;+2 for physical assistance;+2 for safety/equipment   General bed mobility comments: assisted back to bed with increased assist  Transfers Overall transfer level: Needs assistance Equipment used: Rolling walker (2 wheeled) Transfers: Sit to/from [employer] Transfers Sit to Stand: +2 physical assistance;+2 safety/equipment;Max assist Stand pivot transfers: Max assist;+2 physical assistance;+2 safety/equipment       General transfer comment: required increased assist due to increased grogginess and decreased cognition.  Few words and alot of grimacing.    Ambulation/Gait Ambulation/Gait assistance: +2 physical assistance;+2 safety/equipment;Max  assist Gait Distance (Feet): 12 Feet Assistive device: Rolling walker (2 wheeled) Gait Pattern/deviations: Step-to pattern;Decreased stance time - left;Drifts right/left Gait velocity: decreased   General Gait Details: required increased assist due to increased groggyness and decreased alertness.     Stairs             Wheelchair Mobility    Modified Rankin (Stroke Patients Only)       Balance                                            Cognition Arousal/Alertness: Awake/alert                                     General Comments: Hx imparied memory and cognition.  Easily distracted.  Required repeat functional VC's to complete tasks.       Exercises      General Comments        Pertinent Vitals/Pain Pain Assessment: Faces Faces Pain Scale: Hurts a little bit Pain Location: LLE  Pain Descriptors / Indicators: Aching;Grimacing;Sore Pain Intervention(s): Monitored during session;Patient requesting pain meds-RN notified;Ice applied;Repositioned    Home Living                      Prior Function            PT Goals (current goals can now be found in the care plan section) Progress towards PT goals: Progressing toward goals    Frequency    7X/week  PT Plan Current plan remains appropriate    Co-evaluation              AM-PAC PT "6 Clicks" Daily Activity  Outcome Measure  Difficulty turning over in bed (including adjusting bedclothes, sheets and blankets)?: A Lot Difficulty moving from lying on back to sitting on the side of the bed? : A Lot Difficulty sitting down on and standing up from a chair with arms (e.g., wheelchair, bedside commode, etc,.)?: A Lot Help needed moving to and from a bed to chair (including a wheelchair)?: A Lot Help needed walking in hospital room?: A Lot Help needed climbing 3-5 steps with a railing? : Total 6 Click Score: 11    End of Session Equipment Utilized  During Treatment: Gait belt Activity Tolerance: Other (comment)(limited by cognition) Patient left: in chair;with call bell/phone within reach;with chair alarm set;with family/visitor present Nurse Communication: Mobility status PT Visit Diagnosis: Difficulty in walking, not elsewhere classified (R26.2)     Time: 1422-1440 PT Time Calculation (min) (ACUTE ONLY): 18 min  Charges:  $Gait Training: 8-22 mins                      Rica Koyanagi  PTA [facility]  Acute  Rehab Pager      [phone]

## 2018-05-06 NOTE — Progress Notes (Signed)
Physical Therapy Treatment Patient Details Name: TERSA FOTOPOULOS MRN: [id number] DOB: [birth year] Today's Date: 05/06/2018    History of Present Illness s/p L TKA    PT Comments    POD # 1 Pt present with Hx memory impairments and requires repeat functional VC's.  Pleasantly confused.  Applied KI and instructed family on use.  Assisted OOB to amb.  General transfer comment: repeat functional VC's and tactile assist to perform due to Hx poor memory/impaired cognition/easily distracted     Required 50% hand over hand cueing for proper hand placement   Pt is easily distracted and unable to functionally/safely use walker.  Required 100% assist to advance walker and 7% tactile cueing for proper posture, distance and safety with turns.  HIGH FALL RISK.  Pt will need 24/7 assist at home which family is prepared to provide.      Follow Up Recommendations  Outpatient PT;Follow surgeon's recommendation for DC plan and follow-up therapies (OP may be difficult due to pt's cognition and mobility status)     Equipment Recommendations  None recommended by PT    Recommendations for Other Services       Precautions / Restrictions Precautions Precautions: Knee Precaution Comments: applied KI and instructed familly on use  Required Braces or Orthoses: Knee Immobilizer - Left Knee Immobilizer - Left: Discontinue once straight leg raise with < 10 degree lag Restrictions Weight Bearing Restrictions: No Other Position/Activity Restrictions: WBAT    Mobility  Bed Mobility Overal bed mobility: Needs Assistance Bed Mobility: Supine to Sit     Supine to sit: Mod assist;Max assist     General bed mobility comments: 75% simple functional repeat VC's to complete plus use of bed pad to assist scooting to EOB  Transfers Overall transfer level: Needs assistance Equipment used: Rolling walker (2 wheeled) Transfers: Sit to/from [facility] Sit to Stand: Mod assist;Max assist Stand  pivot transfers: Mod assist;Max assist       General transfer comment: repeat functional VC's and tactile assist to perform due to Hx poor memory/impaired cognition/easily distracted     Required 50% hand over hand cueing for proper hand placement   Ambulation/Gait Ambulation/Gait assistance: Max assist Gait Distance (Feet): 28 Feet Assistive device: Rolling walker (2 wheeled) Gait Pattern/deviations: Step-to pattern;Decreased stance time - left;Drifts right/left Gait velocity: decreased   General Gait Details: 75% simple VC's and 100% tactile assist to progress gait, increase weight shift and advance walker.  Pt unable to advance walker on her own.  HIGH FALL RISK.    Stairs             Wheelchair Mobility    Modified Rankin (Stroke Patients Only)       Balance                                            Cognition Arousal/Alertness: Awake/alert                                     General Comments: Hx imparied memory and cognition.  Easily distracted.  Required repeat functional VC's to complete tasks.       Exercises      General Comments        Pertinent Vitals/Pain Pain Assessment: Faces Faces Pain Scale: Hurts a little bit Pain  Location: LLE  Pain Descriptors / Indicators: Aching;Grimacing;Sore Pain Intervention(s): Monitored during session;Patient requesting pain meds-RN notified;Ice applied;Repositioned    Home Living                      Prior Function            PT Goals (current goals can now be found in the care plan section) Progress towards PT goals: Progressing toward goals    Frequency    7X/week      PT Plan Current plan remains appropriate    Co-evaluation              AM-PAC PT "6 Clicks" Daily Activity  Outcome Measure  Difficulty turning over in bed (including adjusting bedclothes, sheets and blankets)?: A Lot Difficulty moving from lying on back to sitting on the side  of the bed? : A Lot Difficulty sitting down on and standing up from a chair with arms (e.g., wheelchair, bedside commode, etc,.)?: A Lot Help needed moving to and from a bed to chair (including a wheelchair)?: A Lot Help needed walking in hospital room?: A Lot Help needed climbing 3-5 steps with a railing? : Total 6 Click Score: 11    End of Session Equipment Utilized During Treatment: Gait belt Activity Tolerance: Other (comment)(limited by cognition) Patient left: in chair;with call bell/phone within reach;with chair alarm set;with family/visitor present Nurse Communication: Mobility status PT Visit Diagnosis: Difficulty in walking, not elsewhere classified (R26.2)     Time: 6659-9357 PT Time Calculation (min) (ACUTE ONLY): 32 min  Charges:  $Gait Training: 8-22 mins $Therapeutic Activity: 8-22 mins                     Rica Koyanagi  PTA [facility]  Acute  Rehab Pager      [phone]

## 2018-05-06 NOTE — Progress Notes (Signed)
   Subjective: 1 Day Post-Op Procedure(s) (LRB): LEFT TOTAL KNEE ARTHROPLASTY (Left) Patient reports pain as mild.   Patient seen in rounds by Dr. Wynelle West. Patient is well, and has had no acute complaints or problems. Pt drowsy this AM. Foley catheter to be removed this AM. Denies chest pain, SOB, or calf pain. We will start ambulating with therapy today.   Objective: Vital signs in last 24 hours: Temp:  [97.4 F (36.3 C)-98.4 F (36.9 C)] 98.1 F (36.7 C) (07/30 0538) Pulse Rate:  [57-86] 80 (07/30 0538) Resp:  [10-24] 18 (07/30 0538) BP: (140-205)/(74-104) 162/85 (07/30 0538) SpO2:  [96 %-100 %] 98 % (07/30 0538)  Intake/Output from previous day:  Intake/Output Summary (Last 24 hours) at 05/06/2018 0743 Last data filed at 05/06/2018 4944 Gross per 24 hour  Intake 3536.25 ml  Output 2010 ml  Net 1526.25 ml    Labs: Recent Labs    05/06/18 0512  HGB 8.9*   Recent Labs    05/06/18 0512  WBC 12.8*  RBC 2.84*  HCT 25.7*  PLT 164   Recent Labs    05/06/18 0512  NA 134*  K 4.2  CL 100  CO2 26  BUN 14  CREATININE 0.61  GLUCOSE 136*  CALCIUM 8.8*   Exam: General - Patient is Alert and Oriented Extremity - Neurologically intact Neurovascular intact Sensation intact distally Dorsiflexion/Plantar flexion intact Dressing - dressing C/D/I Motor Function - intact, moving foot and toes well on exam.   Past Medical History:  Diagnosis Date  . Depression   . Gait instability    walks with cane  . GERD (gastroesophageal reflux disease)   . Hyperlipemia   . Hypertension   . Knee pain   . Mild cognitive impairment with memory loss   . Seasonal allergies     Assessment/Plan: 1 Day Post-Op Procedure(s) (LRB): LEFT TOTAL KNEE ARTHROPLASTY (Left) Principal Problem:   OA (osteoarthritis) of knee  Estimated body mass index is 20.82 kg/m as calculated from the following:   Height as of this encounter: 5\' 6"  (1.676 m).   Weight as of this encounter: 58.5 kg  (129 lb). Advance diet Up with therapy  Anticipated LOS equal to or greater than 2 midnights due to - Age 37 and older with one or more of the following:  - Obesity  - Expected need for hospital services (PT, OT, Nursing) required for safe  discharge  - Anticipated need for postoperative skilled nursing care or inpatient rehab  - Active co-morbidities: None OR   - Unanticipated findings during/Post Surgery: None  - Patient is a high risk of re-admission due to: None    DVT Prophylaxis - Aspirin Weight bearing as tolerated. D/C O2 and pulse ox and try on room air. Hemovac pulled without difficulty, will begin ambulating with therapy today.  Hemoglobin 8.9 this AM, will continue to monitor with CBC in AM.  Plan is to go Home after hospital stay.  Claire Duty, PA-C Orthopedic Surgery 05/06/2018, 7:43 AM

## 2018-05-07 ENCOUNTER — Ambulatory Visit: Payer: Medicare Other | Admitting: Neurology

## 2018-05-07 LAB — BASIC METABOLIC PANEL
Anion gap: 9 (ref 5–15)
BUN: 14 mg/dL (ref 8–23)
CHLORIDE: 98 mmol/L (ref 98–111)
CO2: 30 mmol/L (ref 22–32)
CREATININE: 0.57 mg/dL (ref 0.44–1.00)
Calcium: 8.7 mg/dL — ABNORMAL LOW (ref 8.9–10.3)
GFR calc Af Amer: >60 mL/min (ref >60)
GLUCOSE: 111 mg/dL — AB (ref 70–99)
Potassium: 3.7 mmol/L (ref 3.5–5.1)
Sodium: 137 mmol/L (ref 135–145)

## 2018-05-07 LAB — CBC
HCT: 23.9 % — ABNORMAL LOW (ref 36.0–46.0)
HEMOGLOBIN: 8.2 g/dL — AB (ref 12.0–15.0)
MCH: 31.2 pg (ref 26.0–34.0)
MCHC: 34.3 g/dL (ref 30.0–36.0)
MCV: 90.9 fL (ref 78.0–100.0)
PLATELETS: 134 10*3/uL — AB (ref 150–400)
RBC: 2.63 MIL/uL — AB (ref 3.87–5.11)
RDW: 12.7 % (ref 11.5–15.5)
WBC: 16.1 10*3/uL — ABNORMAL HIGH (ref 4.0–10.5)

## 2018-05-07 MED ORDER — SODIUM CHLORIDE 0.9 % IV BOLUS: 250.0000 mL | Freq: Once | INTRAVENOUS | Status: DC

## 2018-05-07 MED ORDER — SODIUM CHLORIDE 0.9 % IV BOLUS
250.0000 mL | Freq: Once | INTRAVENOUS | Status: AC
  Administered 2018-05-07: 250 mL via INTRAVENOUS

## 2018-05-07 NOTE — Plan of Care (Signed)
Pt alert, slightly confused yet pleasant.  Sister at the bedside.  Was able to ambulate with PT this am.  RN will monitor.

## 2018-05-07 NOTE — Progress Notes (Signed)
Physical Therapy Treatment Patient Details Name: Claire West MRN: [id number] DOB: 1941/02/13 Today's Date: 05/07/2018    History of Present Illness s/p L TKA    PT Comments    POD # 3 Pt more awake (no longer receiving strong pain meds) however remains cognitively impaired and unable to follow functional commands safely to D/C to home.  Requires + 2 assist to get OOB to [facility].  Near fall as pt pushes backward and incomplete turns.  General Gait Details: still requires + 2 assist due to impaired cognition.  HIGH FALL RISK as pt was unable to tolerate WBing L LE (pain) and pushing posteriorly while letting go of walker.  Therapist had to offer full support around pt's waist while advancing walker as pt was unable to do so.   Sister present during session and has concerns about how the family will be able to physically assist. Concerned about car transfer and 2 steps to get into house.     Follow Up Recommendations  Outpatient PT;Follow surgeon's recommendation for DC plan and follow-up therapies(pt NOT progressing, still requires + 2 assist for mobility.  Sister concerned  about how they are going to be able to handle mobility at home)     Equipment Recommendations  None recommended by PT    Recommendations for Other Services       Precautions / Restrictions Precautions Precaution Comments: did not use KI this session Restrictions Weight Bearing Restrictions: No Other Position/Activity Restrictions: WBAT    Mobility  Bed Mobility Overal bed mobility: Needs Assistance Bed Mobility: Sit to Supine     Supine to sit: Mod assist;Max assist Sit to supine: Total assist;Max assist;+2 for physical assistance;+2 for safety/equipment   General bed mobility comments: + 2 assist to transfer from supine to EOB with repeat functional VC's to complete due to impaired cognition.   Transfers Overall transfer level: Needs assistance Equipment used: Rolling walker (2 wheeled) Transfers:  Sit to/from [facility] Sit to Stand: +2 physical assistance;+2 safety/equipment;Max assist Stand pivot transfers: Max assist;+2 physical assistance;+2 safety/equipment       General transfer comment: still requires + 2 assist.  Very unsteady.  assisted from bed to [facility] with near fall as pt let go of walker and was unable to self correct balance instability.    Ambulation/Gait Ambulation/Gait assistance: +2 physical assistance;+2 safety/equipment;Max assist Gait Distance (Feet): 14 Feet Assistive device: Rolling walker (2 wheeled) Gait Pattern/deviations: Step-to pattern;Decreased stance time - left;Drifts right/left;Narrow base of support;Shuffle Gait velocity: decreased   General Gait Details: still requires + 2 assist due to impaired cognition.  HIGH FALL RISK as pt was unable to tolerate WBing L LE (pain) and pushing posteriorly while letting go of walker.  Therapist had to offer full support around pt's waist while advancing walker as pt was unable to do so.     Stairs             Wheelchair Mobility    Modified Rankin (Stroke Patients Only)       Balance                                            Cognition Arousal/Alertness: Awake/alert Behavior During Therapy: WFL for tasks assessed/performed Overall Cognitive Status: Within Functional Limits for tasks assessed  General Comments: Hx imparied memory and cognition.  Easily distracted.  Required repeat functional VC's to complete tasks.       Exercises      General Comments        Pertinent Vitals/Pain Faces Pain Scale: Hurts little more Pain Location: L knee with activity Pain Descriptors / Indicators: Aching;Grimacing;Sore Pain Intervention(s): Monitored during session;Repositioned    Home Living                      Prior Function            PT Goals (current goals can now be found in the care plan section)  Progress towards PT goals: Progressing toward goals    Frequency    7X/week      PT Plan Current plan remains appropriate    Co-evaluation              AM-PAC PT "6 Clicks" Daily Activity  Outcome Measure  Difficulty turning over in bed (including adjusting bedclothes, sheets and blankets)?: A Lot Difficulty moving from lying on back to sitting on the side of the bed? : A Lot Difficulty sitting down on and standing up from a chair with arms (e.g., wheelchair, bedside commode, etc,.)?: A Lot Help needed moving to and from a bed to chair (including a wheelchair)?: A Lot Help needed walking in hospital room?: A Lot Help needed climbing 3-5 steps with a railing? : Total 6 Click Score: 11    End of Session Equipment Utilized During Treatment: Gait belt Activity Tolerance: Other (comment)(impaired cognition) Patient left: in chair;with call bell/phone within reach;with chair alarm set;with family/visitor present Nurse Communication: Mobility status PT Visit Diagnosis: Difficulty in walking, not elsewhere classified (R26.2)     Time: 6606-0045 PT Time Calculation (min) (ACUTE ONLY): 31 min  Charges:  $Gait Training: 8-22 mins $Therapeutic Activity: 8-22 mins                     Rica Koyanagi  PTA [facility]  Acute  Rehab Pager      [phone]

## 2018-05-07 NOTE — Progress Notes (Signed)
Physical Therapy Treatment Patient Details Name: Claire West MRN: [id number] DOB: Jan 29, 1941 Today's Date: 05/07/2018    History of Present Illness s/p L TKA    PT Comments    POD # 3 pm session Pt NOT progressing with her mobility.  Pt more awake with pain med change but still present with cognitive impairment which impedes her ability to safely mobilize for D/C to home.  See details below.    Follow Up Recommendations  Outpatient PT;Follow surgeon's recommendation for DC plan and follow-up therapist  (pt is a BUNDLE and NOT progressing as well as expected.  Still requires + 2 assist, present with impaired cognition and HIGH FALL RISK.  Family concerned about mobility issues.)     Equipment Recommendations  None recommended by PT    Recommendations for Other Services       Precautions / Restrictions Precautions Precaution Comments: did not use KI this session Restrictions Weight Bearing Restrictions: No Other Position/Activity Restrictions: WBAT    Mobility  Bed Mobility Overal bed mobility: Needs Assistance Bed Mobility: Sit to Supine     Supine to sit: Mod assist;Max assist Sit to supine: Total assist;Max assist;+2 for physical assistance;+2 for safety/equipment   General bed mobility comments: OOB in recliner  Transfers Overall transfer level: Needs assistance Equipment used: Rolling walker (2 wheeled) Transfers: Sit to/from [employer] Transfers Sit to Stand: +2 physical assistance;+2 safety/equipment;Max assist Stand pivot transfers: Max assist;+2 physical assistance;+2 safety/equipment       General transfer comment: still required + 2 assist to stand from recliner with 75% VC's on proper hand placement and to increase self participation.  Stand to sit required hand over hand cueing to reach back for chair and control desend.   Ambulation/Gait Ambulation/Gait assistance: +2 physical assistance;+2 safety/equipment;Max assist Gait Distance  (Feet): 6 Feet Assistive device: Rolling walker (2 wheeled) Gait Pattern/deviations: Step-to pattern;Decreased stance time - left;Drifts right/left;Narrow base of support;Shuffle Gait velocity: decreased   General Gait Details: still requires + 2 assist with poor initiation and impaired cognition.  Severior posterior lean and poor standing balance.  pt unable to self correct to midline and severe delayed corrective reaction responce.  Poor steppage responce and poor initiation. Therapist had to "push" pt forward to initate stepping.  HIGH FALL RISK.     Stairs             Wheelchair Mobility    Modified Rankin (Stroke Patients Only)       Balance                                            Cognition Arousal/Alertness: Awake/alert Behavior During Therapy: WFL for tasks assessed/performed Overall Cognitive Status: Within Functional Limits for tasks assessed                                 General Comments: Hx imparied memory and cognition.  Easily distracted.  Required repeat functional VC's to complete tasks.       Exercises      General Comments        Pertinent Vitals/Pain Faces Pain Scale: Hurts little more Pain Location: L knee with activity Pain Descriptors / Indicators: Aching;Grimacing;Sore Pain Intervention(s): Monitored during session;Repositioned    Home Living  Prior Function            PT Goals (current goals can now be found in the care plan section) Progress towards PT goals: Progressing toward goals    Frequency    7X/week      PT Plan Current plan remains appropriate    Co-evaluation              AM-PAC PT "6 Clicks" Daily Activity  Outcome Measure  Difficulty turning over in bed (including adjusting bedclothes, sheets and blankets)?: A Lot Difficulty moving from lying on back to sitting on the side of the bed? : A Lot Difficulty sitting down on and standing up  from a chair with arms (e.g., wheelchair, bedside commode, etc,.)?: A Lot Help needed moving to and from a bed to chair (including a wheelchair)?: A Lot Help needed walking in hospital room?: A Lot Help needed climbing 3-5 steps with a railing? : Total 6 Click Score: 11    End of Session Equipment Utilized During Treatment: Gait belt Activity Tolerance: Other (comment)(impaired cognition) Patient left: in chair;with call bell/phone within reach;with chair alarm set;with family/visitor present Nurse Communication: Mobility status PT Visit Diagnosis: Difficulty in walking, not elsewhere classified (R26.2)     Time: 7062-3762 PT Time Calculation (min) (ACUTE ONLY): 25 min  Charges:  $Gait Training: 8-22 mins $Therapeutic Activity: 8-22 mins                     Rica Koyanagi  PTA [facility]  Acute  Rehab Pager      [phone]

## 2018-05-07 NOTE — Progress Notes (Signed)
   Subjective: 2 Days Post-Op Procedure(s) (LRB): LEFT TOTAL KNEE ARTHROPLASTY (Left) Patient reports pain as mild.   Patient seen in rounds with Dr. Wynelle Link. Patient is well, but has had some minor complaints of confusion yesterday after taking dilaudid tablets per nursing. This has resolved since discontinuing dilaudid. Voiding without difficulty and positive flatus. Denies chest pain, calf pain or SOB.  Plan is to go Home after hospital stay.  Objective: Vital signs in last 24 hours: Temp:  [98.5 F (36.9 C)-99.9 F (37.7 C)] 98.5 F (36.9 C) (07/31 0610) Pulse Rate:  [79-99] 92 (07/31 0610) Resp:  [16-17] 16 (07/30 2226) BP: (154-205)/(73-98) 189/84 (07/31 0610) SpO2:  [93 %-99 %] 94 % (07/31 0610)  Intake/Output from previous day:  Intake/Output Summary (Last 24 hours) at 05/07/2018 0710 Last data filed at 05/06/2018 2307 Gross per 24 hour  Intake 220 ml  Output 0 ml  Net 220 ml    Intake/Output this shift: No intake/output data recorded.  Labs: Recent Labs    05/06/18 0512 05/07/18 0526  HGB 8.9* 8.2*   Recent Labs    05/06/18 0512 05/07/18 0526  WBC 12.8* 16.1*  RBC 2.84* 2.63*  HCT 25.7* 23.9*  PLT 164 134*   Recent Labs    05/06/18 0512 05/07/18 0526  NA 134* 137  K 4.2 3.7  CL 100 98  CO2 26 30  BUN 14 14  CREATININE 0.61 0.57  GLUCOSE 136* 111*  CALCIUM 8.8* 8.7*   No results for input(s): LABPT, INR in the last 72 hours.  Exam: General - Patient is Alert and Oriented Extremity - Neurologically intact Neurovascular intact Sensation intact distally Dorsiflexion/Plantar flexion intact Dressing/Incision - clean, dry, healing with minimal bloody drainage from the distal end of incision Motor Function - intact, moving foot and toes well on exam.   Past Medical History:  Diagnosis Date  . Depression   . Gait instability    walks with cane  . GERD (gastroesophageal reflux disease)   . Hyperlipemia   . Hypertension   . Knee pain   .  Mild cognitive impairment with memory loss   . Seasonal allergies     Assessment/Plan: 2 Days Post-Op Procedure(s) (LRB): LEFT TOTAL KNEE ARTHROPLASTY (Left) Principal Problem:   OA (osteoarthritis) of knee  Estimated body mass index is 20.82 kg/m as calculated from the following:   Height as of this encounter: 5\' 6"  (1.676 m).   Weight as of this encounter: 58.5 kg (129 lb). Up with therapy  DVT Prophylaxis - Aspirin Weight-bearing as tolerated  Hemoglobin down to 8.2 from 8.9 this AM. Pt did experience some weakness yesterday, but attribute this mostly to the dilaudid medication. Pt is only taking tramadol for pain management now. Pt is asymptomatic this AM, will see how she does today and will possibly transfuse depending on patient symptoms.   Possible discharge tomorrow pending progress with therapy and if stable.   Theresa Duty, PA-C Orthopedic Surgery 05/07/2018, 7:10 AM

## 2018-05-08 LAB — CBC
HEMATOCRIT: 22.3 % — AB (ref 36.0–46.0)
HEMOGLOBIN: 7.9 g/dL — AB (ref 12.0–15.0)
MCH: 32.8 pg (ref 26.0–34.0)
MCHC: 35.4 g/dL (ref 30.0–36.0)
MCV: 92.5 fL (ref 78.0–100.0)
Platelets: 128 10*3/uL — ABNORMAL LOW (ref 150–400)
RBC: 2.41 MIL/uL — ABNORMAL LOW (ref 3.87–5.11)
RDW: 13.1 % (ref 11.5–15.5)
WBC: 11.2 10*3/uL — ABNORMAL HIGH (ref 4.0–10.5)

## 2018-05-08 LAB — HEMOGLOBIN AND HEMATOCRIT, BLOOD
HCT: 29.8 % — ABNORMAL LOW (ref 36.0–46.0)
Hemoglobin: 10.1 g/dL — ABNORMAL LOW (ref 12.0–15.0)

## 2018-05-08 LAB — PREPARE RBC (CROSSMATCH)

## 2018-05-08 MED ORDER — SODIUM CHLORIDE 0.9% IV SOLUTION
Freq: Once | INTRAVENOUS | Status: AC
  Administered 2018-05-08: 10:00:00 via INTRAVENOUS

## 2018-05-08 NOTE — Progress Notes (Signed)
Physical Therapy Treatment Patient Details Name: Claire West MRN: [id number] DOB: [birth year] Today's Date: 05/08/2018    History of Present Illness s/p L TKA post op confusion    PT Comments    POD # 3 pm session Pt still requires + 2 assist for all mobility due to imparled cognition.  Pt following one step commands inconsistently and requires repeat direction to stay focused on task.  General transfer comment: still required + 2 assist to transfer from bed to BCS.  Not following instructions.  Incomplete 1/4 pivot with poor self performance.  Had to "bear hug" pt as she was unable to use walker and unable to to properly use B UE's   General Gait Details: still requires + 2 assist with poor initiation and impaired cognition.  Severior posterior lean and poor standing balance.  pt unable to self correct to midline and severe delayed corrective reaction responce.  Poor steppage responce and poor initiation. Also required assist to correctly advance walker as pt tends to push too far to front.  Therapist had to "push" pt forward to initate stepping.  HIGH FALL RISK.    Follow Up Recommendations  (pre op plans was for OP now [facility] however pt  not progressing well)     Equipment Recommendations  None recommended by PT    Recommendations for Other Services       Precautions / Restrictions Precautions Precautions: Knee Precaution Comments: did not use KI this session Restrictions Weight Bearing Restrictions: No Other Position/Activity Restrictions: WBAT    Mobility  Bed Mobility Overal bed mobility: Needs Assistance Bed Mobility: Supine to Sit     Supine to sit: Max assist;+2 for safety/equipment     General bed mobility comments: much assist to sit EOB   Transfers Overall transfer level: Needs assistance Equipment used: Rolling walker (2 wheeled) Transfers: Sit to/from [facility] Sit to Stand: +2 physical assistance;+2 safety/equipment;Max assist Stand pivot  transfers: Max assist;+2 physical assistance;+2 safety/equipment       General transfer comment: still required + 2 assist to transfer from bed to BCS.  Not following instructions.  Incomplete 1/4 pivot with poor self performance.  Had to "bear hug" pt as she was unable to use walker and unable to to properly use B UE's   Ambulation/Gait Ambulation/Gait assistance: +2 physical assistance;+2 safety/equipment;Max assist Gait Distance (Feet): 14 Feet Assistive device: Rolling walker (2 wheeled) Gait Pattern/deviations: Step-to pattern;Decreased stance time - left;Drifts right/left;Narrow base of support;Shuffle Gait velocity: decreased   General Gait Details: still requires + 2 assist with poor initiation and impaired cognition.  Severior posterior lean and poor standing balance.  pt unable to self correct to midline and severe delayed corrective reaction responce.  Poor steppage responce and poor initiation. Therapist had to "push" pt forward to initate stepping.  HIGH FALL RISK.     Stairs Stairs: (unable to attempt due to poor mobility )           Wheelchair Mobility    Modified Rankin (Stroke Patients Only)       Balance                                            Cognition Arousal/Alertness: Awake/alert Behavior During Therapy: Flat affect Overall Cognitive Status: Impaired/Different from baseline Area of Impairment: Orientation;Attention;Memory;Following commands;Safety/judgement;Awareness;Problem solving  Orientation Level: Disoriented to     Following Commands: Follows one step commands inconsistently Safety/Judgement: Decreased awareness of safety;Decreased awareness of deficits     General Comments: Hx imparied memory and cognition. Required repeat functional VC's to complete tasks.       Exercises   Total Knee Replacement TE's 10 reps B LE ankle pumps 10 reps towel squeezes unable to perform due to cognition 10 reps  knee presses unable to perform due to cognition 10 reps heel slides  AAROM 10 reps SAQ's unable to perform 10 reps SLR's AAROM 10 reps ABD AAROM Followed by ICE     General Comments        Pertinent Vitals/Pain Pain Assessment: Faces Faces Pain Scale: Hurts even more Pain Location: L knee with activity Pain Descriptors / Indicators: Aching;Grimacing;Sore Pain Intervention(s): Monitored during session;Repositioned;Ice applied    Home Living                      Prior Function            PT Goals (current goals can now be found in the care plan section) Progress towards PT goals: Progressing toward goals    Frequency    7X/week      PT Plan Current plan remains appropriate    Co-evaluation              AM-PAC PT "6 Clicks" Daily Activity  Outcome Measure  Difficulty turning over in bed (including adjusting bedclothes, sheets and blankets)?: A Lot Difficulty moving from lying on back to sitting on the side of the bed? : A Lot Difficulty sitting down on and standing up from a chair with arms (e.g., wheelchair, bedside commode, etc,.)?: A Lot Help needed moving to and from a bed to chair (including a wheelchair)?: A Lot Help needed walking in hospital room?: A Lot Help needed climbing 3-5 steps with a railing? : Total 6 Click Score: 11    End of Session Equipment Utilized During Treatment: Gait belt Activity Tolerance: Other (comment)(impaired cognition ) Patient left: in chair;with call bell/phone within reach;with chair alarm set;with family/visitor present Nurse Communication: Mobility status PT Visit Diagnosis: Unsteadiness on feet (R26.81)     Time: 1410-1435 PT Time Calculation (min) (ACUTE ONLY): 25 min  Charges:  $Gait Training: 8-22 mins $Therapeutic Activity: 8-22 mins                     .Rica Koyanagi  PTA [facility]  Acute  Rehab Pager      [phone]

## 2018-05-08 NOTE — Progress Notes (Addendum)
Discharge planning, received message that ortho office has set patient up with [facility] for HHPT, evaluate and treat. Contacted [facility] for referral. Has DME. [phone]

## 2018-05-08 NOTE — Progress Notes (Signed)
   Subjective: 3 Days Post-Op Procedure(s) (LRB): LEFT TOTAL KNEE ARTHROPLASTY (Left) Patient reports pain as mild.   Patient seen in rounds with Dr. Wynelle Link. Patient is having problems with weakness and reduced appetite. Urine output has increased since bolus yesterday PM. Denies chest pain, Sob, or calf pain.  Plan is to go Home after hospital stay.  Objective: Vital signs in last 24 hours: Temp:  [97.8 F (36.6 C)-98.6 F (37 C)] 98.3 F (36.8 C) (08/01 0612) Pulse Rate:  [85-94] 85 (08/01 0648) Resp:  [14-17] 14 (08/01 0648) BP: (153-185)/(71-89) 163/72 (08/01 0648) SpO2:  [90 %-98 %] 90 % (08/01 0648)  Intake/Output from previous day:  Intake/Output Summary (Last 24 hours) at 05/08/2018 0804 Last data filed at 05/08/2018 0600 Gross per 24 hour  Intake 580 ml  Output 650 ml  Net -70 ml    Intake/Output this shift: No intake/output data recorded.  Labs: Recent Labs    05/06/18 0512 05/07/18 0526 05/08/18 0530  HGB 8.9* 8.2* 7.9*   Recent Labs    05/07/18 0526 05/08/18 0530  WBC 16.1* 11.2*  RBC 2.63* 2.41*  HCT 23.9* 22.3*  PLT 134* 128*   Recent Labs    05/06/18 0512 05/07/18 0526  NA 134* 137  K 4.2 3.7  CL 100 98  CO2 26 30  BUN 14 14  CREATININE 0.61 0.57  GLUCOSE 136* 111*  CALCIUM 8.8* 8.7*   No results for input(s): LABPT, INR in the last 72 hours.  Exam: General - Patient is Alert and Oriented Extremity - Neurologically intact Neurovascular intact Sensation intact distally Dorsiflexion/Plantar flexion intact Dressing/Incision - clean, dry, no drainage Motor Function - intact, moving foot and toes well on exam.   Past Medical History:  Diagnosis Date  . Depression   . Gait instability    walks with cane  . GERD (gastroesophageal reflux disease)   . Hyperlipemia   . Hypertension   . Knee pain   . Mild cognitive impairment with memory loss   . Seasonal allergies     Assessment/Plan: 3 Days Post-Op Procedure(s) (LRB): LEFT  TOTAL KNEE ARTHROPLASTY (Left) Principal Problem:   OA (osteoarthritis) of knee  Estimated body mass index is 20.82 kg/m as calculated from the following:   Height as of this encounter: 5\' 6"  (1.676 m).   Weight as of this encounter: 58.5 kg (129 lb). Up with therapy  DVT Prophylaxis - Aspirin Weight-bearing as tolerated  Hemoglobin down to 7.9 this AM. Given patient's weakness, we will transfuse two units RBC today. Post-transfusion H&H ordered and will check CBC in the AM to monitor.   Theresa Duty, PA-C Orthopedic Surgery 05/08/2018, 8:04 AM

## 2018-05-08 NOTE — Plan of Care (Signed)
Plan of care discussed.   

## 2018-05-08 NOTE — Progress Notes (Signed)
Patient stable, calm overnight. Urine output leaked around purewick therefore charted output mL amount is not accurate. Family at bedside.

## 2018-05-08 NOTE — Plan of Care (Signed)
Pt alert, resting with son at the bedside.  Order to transfuse 2 units RBCs.  Son and patient educated at the bedside on process, reactions, and indications.  They demonstrated understanding. RN will monitor.

## 2018-05-09 DIAGNOSIS — W19XXXD Unspecified fall, subsequent encounter: Secondary | ICD-10-CM | POA: Diagnosis not present

## 2018-05-09 DIAGNOSIS — I10 Essential (primary) hypertension: Secondary | ICD-10-CM | POA: Diagnosis not present

## 2018-05-09 DIAGNOSIS — Z7401 Bed confinement status: Secondary | ICD-10-CM | POA: Diagnosis not present

## 2018-05-09 DIAGNOSIS — R278 Other lack of coordination: Secondary | ICD-10-CM | POA: Diagnosis not present

## 2018-05-09 DIAGNOSIS — D500 Iron deficiency anemia secondary to blood loss (chronic): Secondary | ICD-10-CM | POA: Diagnosis not present

## 2018-05-09 DIAGNOSIS — R41841 Cognitive communication deficit: Secondary | ICD-10-CM | POA: Diagnosis not present

## 2018-05-09 DIAGNOSIS — F329 Major depressive disorder, single episode, unspecified: Secondary | ICD-10-CM | POA: Diagnosis not present

## 2018-05-09 DIAGNOSIS — K219 Gastro-esophageal reflux disease without esophagitis: Secondary | ICD-10-CM | POA: Diagnosis not present

## 2018-05-09 DIAGNOSIS — E785 Hyperlipidemia, unspecified: Secondary | ICD-10-CM | POA: Diagnosis not present

## 2018-05-09 DIAGNOSIS — M2550 Pain in unspecified joint: Secondary | ICD-10-CM | POA: Diagnosis not present

## 2018-05-09 DIAGNOSIS — M1712 Unilateral primary osteoarthritis, left knee: Secondary | ICD-10-CM | POA: Diagnosis not present

## 2018-05-09 DIAGNOSIS — Z96651 Presence of right artificial knee joint: Secondary | ICD-10-CM | POA: Diagnosis not present

## 2018-05-09 DIAGNOSIS — R2689 Other abnormalities of gait and mobility: Secondary | ICD-10-CM | POA: Diagnosis not present

## 2018-05-09 DIAGNOSIS — Z96659 Presence of unspecified artificial knee joint: Secondary | ICD-10-CM | POA: Diagnosis not present

## 2018-05-09 DIAGNOSIS — D62 Acute posthemorrhagic anemia: Secondary | ICD-10-CM | POA: Diagnosis not present

## 2018-05-09 DIAGNOSIS — F331 Major depressive disorder, recurrent, moderate: Secondary | ICD-10-CM | POA: Diagnosis not present

## 2018-05-09 DIAGNOSIS — M6281 Muscle weakness (generalized): Secondary | ICD-10-CM | POA: Diagnosis not present

## 2018-05-09 LAB — BPAM RBC
BLOOD PRODUCT EXPIRATION DATE: 201908272359
Blood Product Expiration Date: 201909042359
ISSUE DATE / TIME: 201908011115
ISSUE DATE / TIME: 201908011441
UNIT TYPE AND RH: 9500
Unit Type and Rh: 9500

## 2018-05-09 LAB — BASIC METABOLIC PANEL
ANION GAP: 9 (ref 5–15)
BUN: 12 mg/dL (ref 8–23)
CALCIUM: 8.5 mg/dL — AB (ref 8.9–10.3)
CO2: 29 mmol/L (ref 22–32)
Chloride: 96 mmol/L — ABNORMAL LOW (ref 98–111)
Creatinine, Ser: 0.43 mg/dL — ABNORMAL LOW (ref 0.44–1.00)
GLUCOSE: 118 mg/dL — AB (ref 70–99)
Potassium: 3.4 mmol/L — ABNORMAL LOW (ref 3.5–5.1)
Sodium: 134 mmol/L — ABNORMAL LOW (ref 135–145)

## 2018-05-09 LAB — CBC
HCT: 29.7 % — ABNORMAL LOW (ref 36.0–46.0)
Hemoglobin: 10.3 g/dL — ABNORMAL LOW (ref 12.0–15.0)
MCH: 31.1 pg (ref 26.0–34.0)
MCHC: 34.7 g/dL (ref 30.0–36.0)
MCV: 89.7 fL (ref 78.0–100.0)
PLATELETS: 129 10*3/uL — AB (ref 150–400)
RBC: 3.31 MIL/uL — ABNORMAL LOW (ref 3.87–5.11)
RDW: 13.8 % (ref 11.5–15.5)
WBC: 10.2 10*3/uL (ref 4.0–10.5)

## 2018-05-09 LAB — TYPE AND SCREEN
ABO/RH(D): O NEG
Antibody Screen: NEGATIVE
Unit division: 0
Unit division: 0

## 2018-05-09 MED ORDER — TRAMADOL HCL 50 MG PO TABS: 50.0000 mg | ORAL_TABLET | Freq: Four times a day (QID) | ORAL | 0 refills | Status: DC | PRN

## 2018-05-09 MED ORDER — METHOCARBAMOL 500 MG PO TABS: 500.0000 mg | ORAL_TABLET | Freq: Four times a day (QID) | ORAL | 0 refills | Status: DC | PRN

## 2018-05-09 MED ORDER — ASPIRIN 325 MG PO TBEC: 325.0000 mg | DELAYED_RELEASE_TABLET | Freq: Two times a day (BID) | ORAL | 0 refills | Status: AC

## 2018-05-09 NOTE — Progress Notes (Signed)
Physical Therapy Treatment Patient Details Name: Claire West MRN: [id number] DOB: 1941/02/13 Today's Date: 05/09/2018    History of Present Illness s/p L TKA post op confusion    PT Comments    The patient continues to be delayed in [facility]. @ assist to ambulate x 20'. Recommend SNF.   Follow Up Recommendations   SNF     Equipment Recommendations  None recommended by PT    Recommendations for Other Services       Precautions / Restrictions Precautions Precautions: Knee;Fall Required Braces or Orthoses: Knee Immobilizer - Left Knee Immobilizer - Left: Discontinue once straight leg raise with < 10 degree lag Restrictions Weight Bearing Restrictions: No    Mobility  Bed Mobility   Bed Mobility: Supine to Sit     Supine to sit: Max assist     General bed mobility comments: much assist to sit EOB , multimodal cues for sequencing and to initiate each step of activity  Transfers Overall transfer level: Needs assistance Equipment used: Rolling walker (2 wheeled) Transfers: Sit to/from [facility] Sit to Stand: +2 physical assistance;+2 safety/equipment;Mod assist Stand pivot transfers: +2 physical assistance;+2 safety/equipment;Mod assist       General transfer comment:  2 assist to stand from the  bed and BSC, multimodal cues. Patient is Zombied" and needs much stimulation. Stands and took small steps to [employer].  Ambulation/Gait Ambulation/Gait assistance: Mod assist;+2 physical assistance;+2 safety/equipment Gait Distance (Feet): 20 Feet   Gait Pattern/deviations: Step-to pattern;Decreased stance time - left;Drifts right/left;Narrow base of support;Shuffle Gait velocity: decreased   General Gait Details: still requires + 2 assist with poor initiation and impaired cognition.  Pposterior lean and poor standing balance.  pt unable to self correct to midline .  Poor steppage responce and poor initiation.      Stairs             Wheelchair  Mobility    Modified Rankin (Stroke Patients Only)       Balance   Sitting-balance support: Bilateral upper extremity supported;Feet supported Sitting balance-Leahy Scale: Fair Sitting balance - Comments: reliant on UEs    Standing balance support: During functional activity;Bilateral upper extremity supported Standing balance-Leahy Scale: Poor                              Cognition Arousal/Alertness: Awake/alert Behavior During Therapy: Flat affect Overall Cognitive Status: Impaired/Different from baseline Area of Impairment: Orientation;Attention;Memory;Following commands;Safety/judgement;Awareness;Problem solving                 Orientation Level: Disoriented to Current Attention Level: Alternating Memory: Decreased short-term memory Following Commands: Follows one step commands inconsistently Safety/Judgement: Decreased awareness of safety;Decreased awareness of deficits   Problem Solving: Slow processing;Decreased initiation;Requires verbal cues;Requires tactile cues;Difficulty sequencing General Comments: patient requires multiple cues to carry through with  activity.      Exercises      General Comments        Pertinent Vitals/Pain Faces Pain Scale: Hurts little more Pain Location: L knee with activity Pain Descriptors / Indicators: Grimacing;Guarding Pain Intervention(s): Ice applied    Home Living                      Prior Function            PT Goals (current goals can now be found in the care plan section) Progress towards PT goals: Progressing toward goals  Frequency    7X/week      PT Plan Current plan remains appropriate    Co-evaluation              AM-PAC PT "6 Clicks" Daily Activity  Outcome Measure  Difficulty turning over in bed (including adjusting bedclothes, sheets and blankets)?: A Lot Difficulty moving from lying on back to sitting on the side of the bed? : A Lot Difficulty sitting  down on and standing up from a chair with arms (e.g., wheelchair, bedside commode, etc,.)?: Unable Help needed moving to and from a bed to chair (including a wheelchair)?: Total Help needed walking in hospital room?: Total Help needed climbing 3-5 steps with a railing? : Total 6 Click Score: 8    End of Session Equipment Utilized During Treatment: Gait belt Activity Tolerance: Patient limited by fatigue;Patient limited by lethargy Patient left: in chair;with call bell/phone within reach;with chair alarm set Nurse Communication: Mobility status PT Visit Diagnosis: Unsteadiness on feet (R26.81)     Time: [id number] PT Time Calculation (min) (ACUTE ONLY): 34 min  Charges:  $Gait Training: 23-37 mins                     [place] PT [phone]    Claretha Cooper 05/09/2018, 12:01 PM

## 2018-05-09 NOTE — Progress Notes (Signed)
   05/09/18 1418  PT Visit Information  Last PT Received On 05/09/18  History of Present Illness s/p L TKA post op confusion  Precautions  Precautions Knee;Fall  Required Braces or Orthoses Knee Immobilizer - Left  Restrictions  Other Position/Activity Restrictions WBAT  Pain Assessment  Faces Pain Scale 4  Pain Location L knee with activity  Pain Descriptors / Indicators Grimacing;Guarding  Pain Intervention(s) RN gave pain meds during session;Limited activity within patient's tolerance;Monitored during session  Cognition  Arousal/Alertness Awake/alert  Behavior During Therapy Flat affect  Overall Cognitive Status Impaired/Different from baseline  Area of Impairment Orientation;Attention;Memory;Following commands;Safety/judgement;Awareness;Problem solving  Problem Solving Slow processing;Decreased initiation;Requires verbal cues;Requires tactile cues;Difficulty sequencing  Bed Mobility  Sit to supine Mod assist  General bed mobility comments extra time to foloow multimodal cues.  Transfers  Overall transfer level Needs assistance  Equipment used Rolling walker (2 wheeled)  Transfers Sit to/from [facility]  Sit to Stand Mod assist  Stand pivot transfers Mod assist  General transfer comment Assist to rise, multimodal cues from recliner and BSC, frequent recueing required.   Bed brought up t to [facility] for return to bed.   PT - End of Session  Equipment Utilized During Treatment Gait belt  Activity Tolerance Patient limited by fatigue;Patient limited by lethargy  Patient left in bed;with call bell/phone within reach;with bed alarm set;with family/visitor present  Nurse Communication Mobility status   PT - Assessment/Plan  PT Plan Current plan remains appropriate  PT Visit Diagnosis Unsteadiness on feet (R26.81)  PT Frequency (ACUTE ONLY) 7X/week  Follow Up Recommendations SNF  PT equipment None recommended by PT  AM-PAC PT "6 Clicks" Daily Activity Outcome Measure   Difficulty turning over in bed (including adjusting bedclothes, sheets and blankets)? 2  Difficulty moving from lying on back to sitting on the side of the bed?  2  Difficulty sitting down on and standing up from a chair with arms (e.g., wheelchair, bedside commode, etc,.)? 1  Help needed moving to and from a bed to chair (including a wheelchair)? 1  Help needed walking in hospital room? 1  Help needed climbing 3-5 steps with a railing?  1  6 Click Score 8  Mobility G Code  CM  PT Goal Progression  Progress towards PT goals Progressing toward goals  PT Time Calculation  PT Start Time (ACUTE ONLY) 1328  PT Stop Time (ACUTE ONLY) 1349  PT Time Calculation (min) (ACUTE ONLY) 21 min  PT General Charges  $$ ACUTE PT VISIT 1 Visit  PT Treatments  $Therapeutic Activity 8-22 mins  [place] PT [phone]

## 2018-05-09 NOTE — Care Management Important Message (Signed)
Important Message  Patient Details  Name: Claire West MRN: [id number] Date of Birth: 1940/10/09   Medicare Important Message Given:  Yes    Kerin Salen 05/09/2018, 11:09 AMImportant Message  Patient Details  Name: Claire West MRN: [id number] Date of Birth: March 08, 1941   Medicare Important Message Given:  Yes    Kerin Salen 05/09/2018, 11:09 AM

## 2018-05-09 NOTE — Discharge Summary (Signed)
Physician Discharge Summary   Patient ID: Claire West MRN: [id number] DOB/AGE: [birth year] 77 y.o.  Admit date: 05/05/2018 Discharge date:   Primary Diagnosis: Osteoarthritis left knee   Admission Diagnoses:  Past Medical History:  Diagnosis Date  . Depression   . Gait instability    walks with cane  . GERD (gastroesophageal reflux disease)   . Hyperlipemia   . Hypertension   . Knee pain   . Mild cognitive impairment with memory loss   . Seasonal allergies    Discharge Diagnoses:   Principal Problem:   OA (osteoarthritis) of knee  Estimated body mass index is 20.82 kg/m as calculated from the following:   Height as of this encounter: '5\' 6"'$  (1.676 m).   Weight as of this encounter: 58.5 kg (129 lb).  Procedure:  Procedure(s) (LRB): LEFT TOTAL KNEE ARTHROPLASTY (Left)   Consults: None  HPI: Claire West is a 77 y.o. year old year old female with end stage OA of her left knee with progressively worsening pain and dysfunction. She has constant pain, with activity and at rest and significant functional deficits with difficulties even with ADLs. She has had extensive non-op management including analgesics, injections of cortisone and viscosupplements, and home exercise program, but remains in significant pain with significant dysfunction. Radiographs show bone on bone arthritis lateral and patellofemoral. She presents now for left Total Knee Arthroplasty.  Laboratory Data: Admission on 05/05/2018  Component Date Value Ref Range Status  . WBC 05/06/2018 12.8* 4.0 - 10.5 K/uL Final  . RBC 05/06/2018 2.84* 3.87 - 5.11 MIL/uL Final  . Hemoglobin 05/06/2018 8.9* 12.0 - 15.0 g/dL Final  . HCT 05/06/2018 25.7* 36.0 - 46.0 % Final  . MCV 05/06/2018 90.5  78.0 - 100.0 fL Final  . MCH 05/06/2018 31.3  26.0 - 34.0 pg Final  . MCHC 05/06/2018 34.6  30.0 - 36.0 g/dL Final  . RDW 05/06/2018 12.6  11.5 - 15.5 % Final  . Platelets 05/06/2018 164  150 - 400 K/uL Final   Performed at [facility], [place] [address], [place], [place] [address]  . Sodium 05/06/2018 134* 135 - 145 mmol/L Final  . Potassium 05/06/2018 4.2  3.5 - 5.1 mmol/L Final  . Chloride 05/06/2018 100  98 - 111 mmol/L Final  . CO2 05/06/2018 26  22 - 32 mmol/L Final  . Glucose, Bld 05/06/2018 136* 70 - 99 mg/dL Final  . BUN 05/06/2018 14  8 - 23 mg/dL Final  . Creatinine, Ser 05/06/2018 0.61  0.44 - 1.00 mg/dL Final  . Calcium 05/06/2018 8.8* 8.9 - 10.3 mg/dL Final  . GFR calc non Af Amer 05/06/2018 >60  >60 mL/min Final  . GFR calc Af Amer 05/06/2018 >60  >60 mL/min Final   Comment: (NOTE) The eGFR has been calculated using the CKD EPI equation. This calculation has not been validated in all clinical situations. eGFR's persistently <60 mL/min signify possible Chronic Kidney Disease.   Georgiann Hahn gap 05/06/2018 8  5 - 15 Final   Performed at [facility], [place] [address], [place], [place] [address]  . WBC 05/07/2018 16.1* 4.0 - 10.5 K/uL Final  . RBC 05/07/2018 2.63* 3.87 - 5.11 MIL/uL Final  . Hemoglobin 05/07/2018 8.2* 12.0 - 15.0 g/dL Final  . HCT 05/07/2018 23.9* 36.0 - 46.0 % Final  . MCV 05/07/2018 90.9  78.0 - 100.0 fL Final  . MCH 05/07/2018 31.2  26.0 - 34.0 pg Final  . MCHC 05/07/2018 34.3  30.0 - 36.0 g/dL Final  . RDW 05/07/2018 12.7  11.5 - 15.5 % Final  . Platelets 05/07/2018 134* 150 - 400 K/uL Final   Performed at [facility], [place] [address] [place], [place] [address]  . Sodium 05/07/2018 137  135 - 145 mmol/L Final  . Potassium 05/07/2018 3.7  3.5 - 5.1 mmol/L Final  . Chloride 05/07/2018 98  98 - 111 mmol/L Final  . CO2 05/07/2018 30  22 - 32 mmol/L Final  . Glucose, Bld 05/07/2018 111* 70 - 99 mg/dL Final  . BUN 05/07/2018 14  8 - 23 mg/dL Final  . Creatinine, Ser 05/07/2018 0.57  0.44 - 1.00 mg/dL Final  . Calcium 05/07/2018 8.7* 8.9 - 10.3 mg/dL Final  . GFR calc non Af Amer 05/07/2018 >60  >60 mL/min Final  . GFR calc Af Amer  05/07/2018 >60  >60 mL/min Final   Comment: (NOTE) The eGFR has been calculated using the CKD EPI equation. This calculation has not been validated in all clinical situations. eGFR's persistently <60 mL/min signify possible Chronic Kidney Disease.   Georgiann Hahn gap 05/07/2018 9  5 - 15 Final   Performed at [facility], [place] [address], [place], [place] [address]  . WBC 05/08/2018 11.2* 4.0 - 10.5 K/uL Final  . RBC 05/08/2018 2.41* 3.87 - 5.11 MIL/uL Final  . Hemoglobin 05/08/2018 7.9* 12.0 - 15.0 g/dL Final  . HCT 05/08/2018 22.3* 36.0 - 46.0 % Final  . MCV 05/08/2018 92.5  78.0 - 100.0 fL Final  . MCH 05/08/2018 32.8  26.0 - 34.0 pg Final  . MCHC 05/08/2018 35.4  30.0 - 36.0 g/dL Final  . RDW 05/08/2018 13.1  11.5 - 15.5 % Final  . Platelets 05/08/2018 128* 150 - 400 K/uL Final   Performed at [facility], [place] [address], [place], [place] [address]  . Order Confirmation 05/08/2018    Final                   Value:ORDER PROCESSED BY BLOOD BANK Performed at [facility], [place] [address], [place], [place] [address]   . WBC 05/09/2018 10.2  4.0 - 10.5 K/uL Final  . RBC 05/09/2018 3.31* 3.87 - 5.11 MIL/uL Final  . Hemoglobin 05/09/2018 10.3* 12.0 - 15.0 g/dL Final  . HCT 05/09/2018 29.7* 36.0 - 46.0 % Final  . MCV 05/09/2018 89.7  78.0 - 100.0 fL Final  . MCH 05/09/2018 31.1  26.0 - 34.0 pg Final  . MCHC 05/09/2018 34.7  30.0 - 36.0 g/dL Final  . RDW 05/09/2018 13.8  11.5 - 15.5 % Final  . Platelets 05/09/2018 129* 150 - 400 K/uL Final   Performed at [facility], [place] [address], [place], [place] [address]  . Sodium 05/09/2018 134* 135 - 145 mmol/L Final  . Potassium 05/09/2018 3.4* 3.5 - 5.1 mmol/L Final  . Chloride 05/09/2018 96* 98 - 111 mmol/L Final  . CO2 05/09/2018 29  22 - 32 mmol/L Final  . Glucose, Bld 05/09/2018 118* 70 - 99 mg/dL Final  . BUN 05/09/2018 12  8 - 23 mg/dL Final  . Creatinine, Ser  05/09/2018 0.43* 0.44 - 1.00 mg/dL Final  . Calcium 05/09/2018 8.5* 8.9 - 10.3 mg/dL Final  . GFR calc non Af Amer 05/09/2018 >60  >60 mL/min Final  . GFR calc Af Amer 05/09/2018 >60  >60 mL/min Final   Comment: (NOTE) The eGFR has been calculated using  the CKD EPI equation. This calculation has not been validated in all clinical situations. eGFR's persistently <60 mL/min signify possible Chronic Kidney Disease.   Georgiann Hahn gap 05/09/2018 9  5 - 15 Final   Performed at [facility], [place] [address], [place], [place] [address]  . Hemoglobin 05/08/2018 10.1* 12.0 - 15.0 g/dL Final   Comment: DELTA CHECK NOTED POST TRANSFUSION SPECIMEN   . HCT 05/08/2018 29.8* 36.0 - 46.0 % Final   Performed at [place] [address], [place], [place] [address]  Hospital Outpatient Visit on 04/25/2018  Component Date Value Ref Range Status  . aPTT 04/25/2018 29  24 - 36 seconds Final   Performed at [facility], [place] [address] [place], [place] [address]  . WBC 04/25/2018 6.4  4.0 - 10.5 K/uL Final  . RBC 04/25/2018 3.91  3.87 - 5.11 MIL/uL Final  . Hemoglobin 04/25/2018 12.3  12.0 - 15.0 g/dL Final  . HCT 04/25/2018 36.0  36.0 - 46.0 % Final  . MCV 04/25/2018 92.1  78.0 - 100.0 fL Final  . MCH 04/25/2018 31.5  26.0 - 34.0 pg Final  . MCHC 04/25/2018 34.2  30.0 - 36.0 g/dL Final  . RDW 04/25/2018 12.6  11.5 - 15.5 % Final  . Platelets 04/25/2018 206  150 - 400 K/uL Final   Performed at [facility], [place] [address] [place], [place] [address]  . Sodium 04/25/2018 138  135 - 145 mmol/L Final  . Potassium 04/25/2018 5.5* 3.5 - 5.1 mmol/L Final  . Chloride 04/25/2018 102  98 - 111 mmol/L Final   Please note change in reference range.  . CO2 04/25/2018 30  22 - 32 mmol/L Final  . Glucose, Bld 04/25/2018 100* 70 - 99 mg/dL Final   Please note change in reference range.  . BUN 04/25/2018 9  8 - 23 mg/dL Final   Please note  change in reference range.  . Creatinine, Ser 04/25/2018 0.68  0.44 - 1.00 mg/dL Final  . Calcium 04/25/2018 9.3  8.9 - 10.3 mg/dL Final  . Total Protein 04/25/2018 6.2* 6.5 - 8.1 g/dL Final  . Albumin 04/25/2018 3.5  3.5 - 5.0 g/dL Final  . AST 04/25/2018 23  15 - 41 U/L Final  . ALT 04/25/2018 14  0 - 44 U/L Final   Please note change in reference range.  . Alkaline Phosphatase 04/25/2018 83  38 - 126 U/L Final  . Total Bilirubin 04/25/2018 0.5  0.3 - 1.2 mg/dL Final  . GFR calc non Af Amer 04/25/2018 >60  >60 mL/min Final  . GFR calc Af Amer 04/25/2018 >60  >60 mL/min Final   Comment: (NOTE) The eGFR has been calculated using the CKD EPI equation. This calculation has not been validated in all clinical situations. eGFR's persistently <60 mL/min signify possible Chronic Kidney Disease.   Georgiann Hahn gap 04/25/2018 6  5 - 15 Final   Performed at [facility], [place] [address], [place], [place] [address]  . Prothrombin Time 04/25/2018 12.9  11.4 - 15.2 seconds Final  . INR 04/25/2018 0.98   Final   Performed at [facility], [place] [address], [place],  [address]  . ABO/RH(D) 04/25/2018 O NEG   Final  . Antibody Screen 04/25/2018 NEG   Final  . Sample Expiration 04/25/2018 05/08/2018   Final  . Extend sample reason 04/25/2018 NO TRANSFUSIONS OR PREGNANCY IN THE PAST 3 MONTHS  Final  . Unit Number 04/25/2018 [id number]   Final  . Blood Component Type 04/25/2018 RBC LR PHER2   Final  . Unit division 04/25/2018 00   Final  . Status of Unit 04/25/2018 ISSUED   Final  . Transfusion Status 04/25/2018 OK TO TRANSFUSE   Final  . Crossmatch Result 04/25/2018 Compatible   Final  . Unit Number 04/25/2018 [id number]   Final  . Blood Component Type 04/25/2018 RED CELLS,LR   Final  . Unit division 04/25/2018 00   Final  . Status of Unit 04/25/2018 ISSUED   Final  . Transfusion Status 04/25/2018 OK TO TRANSFUSE   Final  . Crossmatch Result 04/25/2018     Final                   Value:Compatible Performed at [facility], [place] [address] [place], [place] [address]   . Color, Urine 04/25/2018 YELLOW  YELLOW Final  . APPearance 04/25/2018 CLEAR  CLEAR Final  . Specific Gravity, Urine 04/25/2018 1.004* 1.005 - 1.030 Final  . pH 04/25/2018 7.0  5.0 - 8.0 Final  . Glucose, UA 04/25/2018 NEGATIVE  NEGATIVE mg/dL Final  . Hgb urine dipstick 04/25/2018 NEGATIVE  NEGATIVE Final  . Bilirubin Urine 04/25/2018 NEGATIVE  NEGATIVE Final  . Ketones, ur 04/25/2018 NEGATIVE  NEGATIVE mg/dL Final  . Protein, ur 04/25/2018 NEGATIVE  NEGATIVE mg/dL Final  . Nitrite 04/25/2018 NEGATIVE  NEGATIVE Final  . Leukocytes, UA 04/25/2018 NEGATIVE  NEGATIVE Final   Performed at [facility] [address] [place], [place] [address]  . MRSA, PCR 04/25/2018 NEGATIVE  NEGATIVE Final  . Staphylococcus aureus 04/25/2018 NEGATIVE  NEGATIVE Final   Comment: (NOTE) The Xpert SA Assay (FDA approved for NASAL specimens in patients 8 years of age and older), is one component of a comprehensive surveillance program. It is not intended to diagnose infection nor to guide or monitor treatment. Performed at [facility], [place] [address], [place], [place] [address]   . ABO/RH(D) 04/25/2018    Final                   Value:O NEG Performed at [facility], [place] [address], [place], [place] [address]   . ISSUE DATE / TIME 04/25/2018 [id number]   Final  . Blood Product Unit Number 04/25/2018 [id number]   Final  . PRODUCT CODE 04/25/2018 [id number]   Final  . Unit Type and Rh 04/25/2018 9500   Final  . Blood Product Expiration Date 04/25/2018 [id number]   Final  . ISSUE DATE / TIME 04/25/2018 [id number]   Final  . Blood Product Unit Number 04/25/2018 [id number]   Final  . PRODUCT CODE 04/25/2018 [id number]   Final  . Unit Type and Rh 04/25/2018 9500   Final  . Blood Product Expiration Date  04/25/2018 [id number]   Final     X-Rays:No results found.  EKG: Orders placed or performed during the hospital encounter of 01/25/18  . ED EKG  . ED EKG  . EKG 12-Lead  . EKG 12-Lead  . EKG     Hospital Course: Claire West is a 77 y.o. who was admitted to [facility]. They were brought to the operating room on 05/05/2018 and underwent Procedure(s): LEFT TOTAL KNEE ARTHROPLASTY.  Patient tolerated the procedure well and was later transferred to the recovery room and then to the orthopaedic floor for postoperative care.  They were given PO and IV  analgesics for pain control following their surgery.  They were given 24 hours of postoperative antibiotics of  Anti-infectives (From admission, onward)   Start     Dose/Rate Route Frequency Ordered Stop   05/05/18 2100  vancomycin (VANCOCIN) IVPB 1000 mg/200 mL premix     1,000 mg 200 mL/hr over 60 Minutes Intravenous Every 12 hours 05/05/18 1238 05/05/18 2139   05/05/18 0715  vancomycin (VANCOCIN) IVPB 1000 mg/200 mL premix     1,000 mg 200 mL/hr over 60 Minutes Intravenous On call to O.R. 05/05/18 6269 05/05/18 1052     and started on DVT prophylaxis in the form of Aspirin.   PT and OT were ordered for total joint protocol.  Discharge planning consulted to help with postop disposition and equipment needs.  Patient had a decent night on the evening of surgery. They start to get up OOB with therapy on POD #0. Hemovac drain was pulled without difficulty on POD #1. Continued to work with therapy into POD #2. Dressing was changed and the incision was clean, dry, and intact with minimal blood drainage. Pt had problems with confusion after taking dilaudid, prompting discontinuation of the pain medication. This drowsiness lasted for approximately 2-3 days. Patient continued to work with therapy on post-operative days 2 and 3. Incision was healing well. Pt's hemoglobin dropped to 7.9 on POD #3, and 2 units of RBCs were transfused, with a  post-transfusion hemoglobin much improved at 10.3. On POD #4, patient's alertness and drowsiness had improved. However, patient had not progressed sufficiently with physical therapy. After speaking with the family and patient, it was decided that discharge to a skilled nursing facility would be more appropriate if the patient did not progress with therapy that day.   Diet: Cardiac diet Activity: WBAT Follow-up: in 2 weeks Disposition - Skilled nursing facility Discharged Condition: stable   Discharge Instructions    Call MD / Call 911   Complete by:  As directed    If you experience chest pain or shortness of breath, CALL 911 and be transported to the hospital emergency room.  If you develope a fever above 101 F, pus (white drainage) or increased drainage or redness at the wound, or calf pain, call your surgeon's office.   Change dressing   Complete by:  As directed    Change the dressing daily with sterile 4 x 4 inch gauze dressing and apply TED hose.   Constipation Prevention   Complete by:  As directed    Drink plenty of fluids.  Prune juice may be helpful.  You may use a stool softener, such as Colace (over the counter) 100 mg twice a day.  Use MiraLax (over the counter) for constipation as needed.   Diet - low sodium heart healthy   Complete by:  As directed    Discharge instructions   Complete by:  As directed    Dr. Gaynelle Arabian Total Joint Specialist [facility] [address] [place], [place] [address] [phone]  TOTAL KNEE REPLACEMENT POSTOPERATIVE DIRECTIONS  Knee Rehabilitation, Guidelines Following Surgery  Results after knee surgery are often greatly improved when you follow the exercise, range of motion and muscle strengthening exercises prescribed by your doctor. Safety measures are also important to protect the knee from further injury. Any time any of these exercises cause you to have increased pain or swelling in your knee joint, decrease the  amount until you are comfortable again and slowly increase them. If you have problems or  questions, call your caregiver or physical therapist for advice.   HOME CARE INSTRUCTIONS  Remove items at home which could result in a fall. This includes throw rugs or furniture in walking pathways.  ICE to the affected knee every three hours for 30 minutes at a time and then as needed for pain and swelling.  Continue to use ice on the knee for pain and swelling from surgery. You may notice swelling that will progress down to the foot and ankle.  This is normal after surgery.  Elevate the leg when you are not up walking on it.   Continue to use the breathing machine which will help keep your temperature down.  It is common for your temperature to cycle up and down following surgery, especially at night when you are not up moving around and exerting yourself.  The breathing machine keeps your lungs expanded and your temperature down. Do not place pillow under knee, focus on keeping the knee straight while resting   DIET You may resume your previous home diet once your are discharged from the hospital.  DRESSING / WOUND CARE / SHOWERING You may shower 3 days after surgery, but keep the wounds dry during showering.  You may use an occlusive plastic wrap (Press'n Seal for example), NO SOAKING/SUBMERGING IN THE BATHTUB.  If the bandage gets wet, change with a clean dry gauze.  If the incision gets wet, pat the wound dry with a clean towel. You may start showering once you are discharged home but do not submerge the incision under water. Just pat the incision dry and apply a dry gauze dressing on daily. Change the surgical dressing daily and reapply a dry dressing each time.  ACTIVITY Walk with your walker as instructed. Use walker as long as suggested by your caregivers. Avoid periods of inactivity such as sitting longer than an hour when not asleep. This helps prevent blood clots.  You may resume a sexual  relationship in one month or when given the OK by your doctor.  You may return to work once you are cleared by your doctor.  Do not drive a car for 6 weeks or until released by you surgeon.  Do not drive while taking narcotics.  WEIGHT BEARING Weight bearing as tolerated with assist device (walker, cane, etc) as directed, use it as long as suggested by your surgeon or therapist, typically at least 4-6 weeks.  POSTOPERATIVE CONSTIPATION PROTOCOL Constipation - defined medically as fewer than three stools per week and severe constipation as less than one stool per week.  One of the most common issues patients have following surgery is constipation.  Even if you have a regular bowel pattern at home, your normal regimen is likely to be disrupted due to multiple reasons following surgery.  Combination of anesthesia, postoperative narcotics, change in appetite and fluid intake all can affect your bowels.  In order to avoid complications following surgery, here are some recommendations in order to help you during your recovery period.  Colace (docusate) - Pick up an over-the-counter form of Colace or another stool softener and take twice a day as long as you are requiring postoperative pain medications.  Take with a full glass of water daily.  If you experience loose stools or diarrhea, hold the colace until you stool forms back up.  If your symptoms do not get better within 1 week or if they get worse, check with your doctor.  Dulcolax (bisacodyl) - Pick up over-the-counter and take as  directed by the product packaging as needed to assist with the movement of your bowels.  Take with a full glass of water.  Use this product as needed if not relieved by Colace only.   MiraLax (polyethylene glycol) - Pick up over-the-counter to have on hand.  MiraLax is a solution that will increase the amount of water in your bowels to assist with bowel movements.  Take as directed and can mix with a glass of water, juice,  soda, coffee, or tea.  Take if you go more than two days without a movement. Do not use MiraLax more than once per day. Call your doctor if you are still constipated or irregular after using this medication for 7 days in a row.  If you continue to have problems with postoperative constipation, please contact the office for further assistance and recommendations.  If you experience "the worst abdominal pain ever" or develop nausea or vomiting, please contact the office immediatly for further recommendations for treatment.  ITCHING  If you experience itching with your medications, try taking only a single pain pill, or even half a pain pill at a time.  You can also use Benadryl over the counter for itching or also to help with sleep.   TED HOSE STOCKINGS Wear the elastic stockings on both legs for three weeks following surgery during the day but you may remove then at night for sleeping.  MEDICATIONS See your medication summary on the "After Visit Summary" that the nursing staff will review with you prior to discharge.  You may have some home medications which will be placed on hold until you complete the course of blood thinner medication.  It is important for you to complete the blood thinner medication as prescribed by your surgeon.  Continue your approved medications as instructed at time of discharge.  PRECAUTIONS If you experience chest pain or shortness of breath - call 911 immediately for transfer to the hospital emergency department.  If you develop a fever greater that 101 F, purulent drainage from wound, increased redness or drainage from wound, foul odor from the wound/dressing, or calf pain - CONTACT YOUR SURGEON.                                                   FOLLOW-UP APPOINTMENTS Make sure you keep all of your appointments after your operation with your surgeon and caregivers. You should call the office at the above phone number and make an appointment for approximately two weeks  after the date of your surgery or on the date instructed by your surgeon outlined in the "After Visit Summary".   RANGE OF MOTION AND STRENGTHENING EXERCISES  Rehabilitation of the knee is important following a knee injury or an operation. After just a few days of immobilization, the muscles of the thigh which control the knee become weakened and shrink (atrophy). Knee exercises are designed to build up the tone and strength of the thigh muscles and to improve knee motion. Often times heat used for twenty to thirty minutes before working out will loosen up your tissues and help with improving the range of motion but do not use heat for the first two weeks following surgery. These exercises can be done on a training (exercise) mat, on the floor, on a table or on a bed. Use what ever works the  best and is most comfortable for you Knee exercises include:  Leg Lifts - While your knee is still immobilized in a splint or cast, you can do straight leg raises. Lift the leg to 60 degrees, hold for 3 sec, and slowly lower the leg. Repeat 10-20 times 2-3 times daily. Perform this exercise against resistance later as your knee gets better.  Quad and Hamstring Sets - Tighten up the muscle on the front of the thigh (Quad) and hold for 5-10 sec. Repeat this 10-20 times hourly. Hamstring sets are done by pushing the foot backward against an object and holding for 5-10 sec. Repeat as with quad sets.  Leg Slides: Lying on your back, slowly slide your foot toward your buttocks, bending your knee up off the floor (only go as far as is comfortable). Then slowly slide your foot back down until your leg is flat on the floor again. Angel Wings: Lying on your back spread your legs to the side as far apart as you can without causing discomfort.  A rehabilitation program following serious knee injuries can speed recovery and prevent re-injury in the future due to weakened muscles. Contact your doctor or a physical therapist for more  information on knee rehabilitation.   IF YOU ARE TRANSFERRED TO A SKILLED REHAB FACILITY If the patient is transferred to a skilled rehab facility following release from the hospital, a list of the current medications will be sent to the facility for the patient to continue.  When discharged from the skilled rehab facility, please have the facility set up the patient's [place] prior to being released. Also, the skilled facility will be responsible for providing the patient with their medications at time of release from the facility to include their pain medication, the muscle relaxants, and their blood thinner medication. If the patient is still at the rehab facility at time of the two week follow up appointment, the skilled rehab facility will also need to assist the patient in arranging follow up appointment in our office and any transportation needs.  MAKE SURE YOU:  Understand these instructions.  Get help right away if you are not doing well or get worse.    Pick up stool softner and laxative for home use following surgery while on pain medications. Do not submerge incision under water. Please use good hand washing techniques while changing dressing each day. May shower starting three days after surgery. Please use a clean towel to pat the incision dry following showers. Continue to use ice for pain and swelling after surgery. Do not use any lotions or creams on the incision until instructed by your surgeon.   Do not put a pillow under the knee. Place it under the heel.   Complete by:  As directed    Driving restrictions   Complete by:  As directed    No driving for two weeks   TED hose   Complete by:  As directed    Use stockings (TED hose) for three weeks on both leg(s).  You may remove them at night for sleeping.   Weight bearing as tolerated   Complete by:  As directed      Allergies as of 05/09/2018      Reactions   Codeine Hives, Rash   Neosporin  [neomycin-bacitracin Zn-polymyx] Rash   Penicillins Rash   Oral rash/peeling Has patient had a PCN reaction causing immediate rash, facial/tongue/throat swelling, SOB or lightheadedness with hypotension: Yes Has patient had a PCN  reaction causing severe rash involving mucus membranes or skin necrosis: No Has patient had a PCN reaction that required hospitalization No Has patient had a PCN reaction occurring within the last 10 years: No If all of the above answers are "NO", then may proceed with Cephalosporin use.   Sulfa Antibiotics Hives, Rash      Medication List    STOP taking these medications   BIOFREEZE EX   meloxicam 7.5 MG tablet Commonly known as:  MOBIC     TAKE these medications   aspirin 325 MG EC tablet Take 1 tablet (325 mg total) by mouth 2 (two) times daily for 17 days. Take one tablet (325 mg) Aspirin two times a day for three weeks following surgery. Then take one baby Aspirin (81 mg) once a day for three weeks. Then discontinue aspirin.   cholecalciferol 1000 units tablet Commonly known as:  VITAMIN D Take 1,000 Units by mouth daily at 12 noon.   escitalopram 10 MG tablet Commonly known as:  LEXAPRO Take 10 mg by mouth at bedtime.   fexofenadine 180 MG tablet Commonly known as:  ALLEGRA Take 180 mg by mouth daily.   Krill Oil 500 MG Caps Take 500 mg by mouth daily at 12 noon.   methocarbamol 500 MG tablet Commonly known as:  ROBAXIN Take 1 tablet (500 mg total) by mouth every 6 (six) hours as needed for muscle spasms.   metoprolol succinate 100 MG 24 hr tablet Commonly known as:  TOPROL-XL Take 100 mg by mouth 2 (two) times daily. Take with or immediately following a meal.   multivitamin with minerals Tabs tablet Take 1 tablet by mouth daily. Women's Multivitamin   omeprazole 20 MG capsule Commonly known as:  PRILOSEC Take 20 mg by mouth daily before breakfast.   potassium chloride 10 MEQ tablet Commonly known as:  K-DUR Take 1 tablet (10  mEq total) by mouth daily. What changed:  when to take this   PROBIOTIC PO Take 1 capsule by mouth daily. 25 Billion   traMADol 50 MG tablet Commonly known as:  ULTRAM Take 1-2 tablets (50-100 mg total) by mouth every 6 (six) hours as needed for moderate pain.   valsartan 320 MG tablet Commonly known as:  DIOVAN Take 320 mg by mouth daily at 3 pm.            Discharge Care Instructions  (From admission, onward)        Start     Ordered   05/09/18 0000  Weight bearing as tolerated     05/09/18 0724   05/09/18 0000  Change dressing    Comments:  Change the dressing daily with sterile 4 x 4 inch gauze dressing and apply TED hose.   05/09/18 0724     Follow-up Information    Gaynelle Arabian, MD. Schedule an appointment as soon as possible for a visit on 05/20/2018.   Specialty:  Orthopedic Surgery Contact information: [address] [place] [address] [phone]        [facility] Follow up.   Specialty:  [place] Why:  physical therapy Contact information: [address] [place] [address] [phone]           Signed: Theresa Duty, PA-C Orthopaedic Surgery 05/09/2018, 7:25 AM

## 2018-05-09 NOTE — Progress Notes (Signed)
Report called to Nia, LPN at [facility].

## 2018-05-09 NOTE — [place] FL2 (Addendum)
[place] MEDICAID FL2 LEVEL OF CARE SCREENING TOOL     IDENTIFICATION  Patient Name: Claire West Birthdate: April 13, 1941 Sex: female Admission Date (Current Location): 05/05/2018  [facility] and [place] Number:  Herbalist and Address:  [facility],  [place] [address] [place]      Provider Number: [id number]  Attending Physician Name and Address:  Gaynelle Arabian, MD  Relative Name and Phone Number:       Current Level of Care: Hospital Recommended Level of Care: [place] Prior Approval Number:    Date Approved/Denied:   PASRR Number:   [id number]   Discharge Plan: SNF    Current Diagnoses: Patient Active Problem List   Diagnosis Date Noted  . OA (osteoarthritis) of knee 05/05/2018  . Amnestic MCI (mild cognitive impairment with memory loss) 12/24/2017  . Acute metabolic encephalopathy 46/27/0350  . Altered mental status 11/06/2017  . Hyponatremia 02/12/2016  . Hypokalemia 02/12/2016  . Dehydration 02/12/2016  . Elevated LFTs 02/12/2016  . HTN (hypertension) 02/12/2016  . GERD (gastroesophageal reflux disease) 02/12/2016    Orientation RESPIRATION BLADDER Height & Weight     Self,  Mild Cognitive Impairment  Normal Continent Weight: 58.5 kg (129 lb) Height:  5\' 6"  (167.6 cm)  BEHAVIORAL SYMPTOMS/MOOD NEUROLOGICAL BOWEL NUTRITION STATUS      Continent Diet(Low Sodium Heart Healthy )  AMBULATORY STATUS COMMUNICATION OF NEEDS Skin   Extensive Assist Verbally Surgical wounds Left Knee                       Personal Care Assistance Level of Assistance  Bathing, Feeding, Dressing Bathing Assistance: Maximum assistance  Feeding assistance: Independent Dressing Assistance: Maximum assistance     Functional Limitations Info  Sight, Hearing, Speech Sight Info: Adequate Hearing Info: Adequate Speech Info: Adequate    SPECIAL CARE FACTORS FREQUENCY  PT (By licensed PT), OT (By licensed OT)     PT  Frequency: 7x/week OT Frequency: 7x/week            Contractures Contractures Info: Not present    Additional Factors Info  Code Status, Allergies, Psychotropic Code Status Info: fullcode Allergies Info: Codeine, Neosporin Neomycin-bacitracin Zn-polymyx, Penicillins, Sulfa Antibiotics Psychotropic Info: lexapro         Current Medications (05/09/2018):  This is the current hospital active medication list Current Facility-Administered Medications  Medication Dose Route Frequency Provider Last Rate Last Dose  . 0.9 %  sodium chloride infusion   Intravenous Continuous Gaynelle Arabian, MD 75 mL/hr at 05/06/18 0317    . aspirin EC tablet 325 mg  325 mg Oral BID Gaynelle Arabian, MD   325 mg at 05/09/18 1014  . bisacodyl (DULCOLAX) suppository 10 mg  10 mg Rectal Daily PRN Gaynelle Arabian, MD      . diphenhydrAMINE (BENADRYL) 12.5 MG/5ML elixir 12.5-25 mg  12.5-25 mg Oral Q4H PRN Aluisio, Pilar Plate, MD      . docusate sodium (COLACE) capsule 100 mg  100 mg Oral BID Gaynelle Arabian, MD   100 mg at 05/09/18 1014  . escitalopram (LEXAPRO) tablet 10 mg  10 mg Oral QHS Gaynelle Arabian, MD   10 mg at 05/08/18 2104  . irbesartan (AVAPRO) tablet 300 mg  300 mg Oral Daily Gaynelle Arabian, MD   300 mg at 05/08/18 1639  . loratadine (CLARITIN) tablet 10 mg  10 mg Oral Daily Gaynelle Arabian, MD   10 mg at 05/09/18 1014  . menthol-cetylpyridinium (CEPACOL) lozenge  3 mg  1 lozenge Oral PRN Gaynelle Arabian, MD       Or  . phenol (CHLORASEPTIC) mouth spray 1 spray  1 spray Mouth/Throat PRN Aluisio, Pilar Plate, MD      . methocarbamol (ROBAXIN) tablet 500 mg  500 mg Oral Q6H PRN Gaynelle Arabian, MD   500 mg at 05/08/18 1814   Or  . methocarbamol (ROBAXIN) 500 mg in dextrose 5 % 50 mL IVPB  500 mg Intravenous Q6H PRN Aluisio, Pilar Plate, MD      . metoCLOPramide (REGLAN) tablet 5-10 mg  5-10 mg Oral Q8H PRN Aluisio, Pilar Plate, MD       Or  . metoCLOPramide (REGLAN) injection 5-10 mg  5-10 mg Intravenous Q8H PRN Gaynelle Arabian, MD    10 mg at 05/05/18 1752  . metoprolol succinate (TOPROL-XL) 24 hr tablet 100 mg  100 mg Oral BID Gaynelle Arabian, MD   100 mg at 05/09/18 0617  . ondansetron (ZOFRAN) tablet 4 mg  4 mg Oral Q6H PRN Aluisio, Pilar Plate, MD       Or  . ondansetron (ZOFRAN) injection 4 mg  4 mg Intravenous Q6H PRN Gaynelle Arabian, MD   4 mg at 05/05/18 1604  . pantoprazole (PROTONIX) EC tablet 40 mg  40 mg Oral Daily Gaynelle Arabian, MD   40 mg at 05/09/18 1014  . polyethylene glycol (MIRALAX / GLYCOLAX) packet 17 g  17 g Oral Daily PRN Aluisio, Pilar Plate, MD      . potassium chloride (K-DUR) CR tablet 10 mEq  10 mEq Oral Q1200 Gaynelle Arabian, MD   10 mEq at 05/08/18 1101  . sodium phosphate (FLEET) 7-19 GM/118ML enema 1 enema  1 enema Rectal Once PRN Aluisio, Pilar Plate, MD      . traMADol Veatrice Bourbon) tablet 50-100 mg  50-100 mg Oral Q6H PRN Gaynelle Arabian, MD   50 mg at 05/09/18 0617     Discharge Medications: Please see discharge summary for a list of discharge medications.  Relevant Imaging Results:  Relevant Lab Results:   Additional Information ssn:[phone]  Lia Hopping, LCSW

## 2018-05-09 NOTE — Progress Notes (Signed)
   Subjective: 4 Days Post-Op Procedure(s) (LRB): LEFT TOTAL KNEE ARTHROPLASTY (Left) Patient reports pain as mild.   Patient seen in rounds by Dr. Wynelle Link. Patient is struggling to progress with physical therapy. Voiding without difficulty. Denies chest pain, SOB, or calf pain.  Objective: Vital signs in last 24 hours: Temp:  [98.4 F (36.9 C)-99.1 F (37.3 C)] 99 F (37.2 C) (08/02 0553) Pulse Rate:  [84-98] 84 (08/02 0553) Resp:  [16-18] 17 (08/02 0553) BP: (140-195)/(66-91) 195/86 (08/02 0553) SpO2:  [92 %-96 %] 95 % (08/02 0553)  Intake/Output from previous day:  Intake/Output Summary (Last 24 hours) at 05/09/2018 0718 Last data filed at 05/09/2018 0600 Gross per 24 hour  Intake 2130 ml  Output 1600 ml  Net 530 ml    Intake/Output this shift: No intake/output data recorded.  Labs: Recent Labs    05/07/18 0526 05/08/18 0530 05/08/18 1933 05/09/18 0541  HGB 8.2* 7.9* 10.1* 10.3*   Recent Labs    05/08/18 0530 05/08/18 1933 05/09/18 0541  WBC 11.2*  --  10.2  RBC 2.41*  --  3.31*  HCT 22.3* 29.8* 29.7*  PLT 128*  --  129*   Recent Labs    05/07/18 0526 05/09/18 0541  NA 137 134*  K 3.7 3.4*  CL 98 96*  CO2 30 29  BUN 14 12  CREATININE 0.57 0.43*  GLUCOSE 111* 118*  CALCIUM 8.7* 8.5*   No results for input(s): LABPT, INR in the last 72 hours.  Exam: General - Patient is Alert and Oriented Extremity - Neurologically intact Neurovascular intact Sensation intact distally Dorsiflexion/Plantar flexion intact Dressing/Incision - clean, dry, no drainage Motor Function - intact, moving foot and toes well on exam.   Past Medical History:  Diagnosis Date  . Depression   . Gait instability    walks with cane  . GERD (gastroesophageal reflux disease)   . Hyperlipemia   . Hypertension   . Knee pain   . Mild cognitive impairment with memory loss   . Seasonal allergies     Assessment/Plan: 4 Days Post-Op Procedure(s) (LRB): LEFT TOTAL KNEE  ARTHROPLASTY (Left) Principal Problem:   OA (osteoarthritis) of knee  Estimated body mass index is 20.82 kg/m as calculated from the following:   Height as of this encounter: 5\' 6"  (1.676 m).   Weight as of this encounter: 58.5 kg (129 lb). Up with therapy  DVT Prophylaxis - Aspirin Weight-bearing as tolerated  Post-transfusion hemoglobin this AM is much improved at 10.3. Spoke with both patient and family this AM, patient is not progressing enough with therapy. Consult for skilled nursing facility placement has been ordered. If she does well today and progresses with therapy, she may be discharged to home tomorrow with HHPT followed by outpatient physical therapy. However, if she does not progress, she will require SNF placement will discharge either today or tomorrow. Patient and family are both in agreement with this plan.   Theresa Duty, PA-C Orthopedic Surgery 05/09/2018, 7:18 AM

## 2018-05-09 NOTE — Clinical Social Work Placement (Addendum)
D/C Summary sent.  PTAR arranged for 3:30 pick up.  Nurse given the number to call report.    CLINICAL SOCIAL WORK PLACEMENT  NOTE  Date:  05/09/2018  Patient Details  Name: Claire West MRN: [id number] Date of Birth: 1941-06-07  Clinical Social Work is seeking post-discharge placement for this patient at the [place] level of care (*CSW will initial, date and re-position this form in  chart as items are completed):  No   Patient/family provided with [place] Work Department's list of facilities offering this level of care within the geographic area requested by the patient (or if unable, by the patient's family).  No   Patient/family informed of their freedom to choose among providers that offer the needed level of care, that participate in Medicare, Medicaid or managed care program needed by the patient, have an available bed and are willing to accept the patient.  No   Patient/family informed of [facility]'s ownership interest in [facility] and [facility], as well as of the fact that they are under no obligation to receive care at these facilities.  PASRR submitted to EDS on 05/09/18     PASRR number received on 05/09/18     Existing PASRR number confirmed on       FL2 transmitted to all facilities in geographic area requested by pt/family on 05/09/18     FL2 transmitted to all facilities within larger geographic area on 05/09/18     Patient informed that his/her managed care company has contracts with or will negotiate with certain facilities, including the following:  [facility]     Yes   Patient/family informed of bed offers received.  Patient [facility] bed at [facility]     Physician recommends and patient chooses bed at [facility]    Patient [facility] on 05/09/18.  Patient to be transferred to facility by PTAR      Patient family  notified on 05/09/18 of transfer.  Name of family member notified:  Sister at bedside      PHYSICIAN       Additional Comment:    _______________________________________________ Lia Hopping, LCSW 05/09/2018, 12:38 PM

## 2018-05-09 NOTE — Clinical Social Work Note (Signed)
Clinical Social Work Assessment  Patient Details  Name: Claire West MRN: [id number] Date of Birth: 14-Nov-1940  Date of referral:  05/09/18               Reason for consult:  Facility Placement                Permission sought to share information with:  Family Supports, Chartered certified accountant granted to share information::  Yes, Verbal Permission Granted  Name::        Agency::  SNF  Relationship::  Sister  Contact Information:     Housing/Transportation Living arrangements for the past 2 months:  [place] of Information:  Patient Patient Interpreter Needed:  None Criminal Activity/Legal Involvement Pertinent to Current Situation/Hospitalization:  No - Comment as needed Significant Relationships:  None Lives with:  Self Do you feel safe going back to the place where you live?  No Need for family participation in patient care:  No (Coment)  Care giving concerns:   Physician and physical therapy recommend short rehab.   Social Worker assessment / plan: CSW met with patient at bedside, explain role and reason for visit- to assist with dc to snf. Patient progressing slowly with physical therapy and requiring assistance with activities of daily living. Patient sister reports the patient has mild cognitive impairment and family stays with the patient around the clock. Family is agreeable patient will need rehab before returning home.   CSW explained SNF process. Patient sister states the physician prefers [facility]. CSW checked for bed availability at SNF. CSW confirm bed at [facility], patient able to transfer. Patient has Medicare insurance has met 3 night stay requirement. PTAR to transport.  Sister will fill out paperwork at 3:30pm.   FL2 complete.  PASRR Complete  Plan: SNF    Employment status:  Retired Forensic scientist:    PT Recommendations:  [place] / Referral to community  resources:  [place]  Patient/Family's Response to care:  Agreeable and Responding well to care.   Patient/Family's Understanding of and Emotional Response to Diagnosis, Current Treatment, and Prognosis:  Patient has mild cognitive impairment. Patient son and sister are primary caretakers and assist with patient care. Both have a good understanding of patient diagnosis and current treatment.   Emotional Assessment Appearance:  Appears stated age Attitude/Demeanor/Rapport:    Affect (typically observed):  Accepting Orientation:  Oriented to Self Alcohol / Substance use:  Not Applicable Psych involvement (Current and /or in the community):  No (Comment)  Discharge Needs  Concerns to be addressed:  Discharge Planning Concerns Readmission within the last 30 days:  No Current discharge risk:  Dependent with Mobility Barriers to Discharge:  Continued Medical Work up   [employer], LCSW 05/09/2018, 12:04 PM

## 2018-05-12 DIAGNOSIS — F329 Major depressive disorder, single episode, unspecified: Secondary | ICD-10-CM | POA: Diagnosis not present

## 2018-05-12 DIAGNOSIS — M1712 Unilateral primary osteoarthritis, left knee: Secondary | ICD-10-CM | POA: Diagnosis not present

## 2018-05-12 DIAGNOSIS — I10 Essential (primary) hypertension: Secondary | ICD-10-CM | POA: Diagnosis not present

## 2018-05-12 DIAGNOSIS — K219 Gastro-esophageal reflux disease without esophagitis: Secondary | ICD-10-CM | POA: Diagnosis not present

## 2018-05-13 DIAGNOSIS — I10 Essential (primary) hypertension: Secondary | ICD-10-CM | POA: Diagnosis not present

## 2018-05-13 DIAGNOSIS — D62 Acute posthemorrhagic anemia: Secondary | ICD-10-CM | POA: Diagnosis not present

## 2018-05-13 DIAGNOSIS — K219 Gastro-esophageal reflux disease without esophagitis: Secondary | ICD-10-CM | POA: Diagnosis not present

## 2018-05-13 DIAGNOSIS — M1712 Unilateral primary osteoarthritis, left knee: Secondary | ICD-10-CM | POA: Diagnosis not present

## 2018-05-14 DIAGNOSIS — Z96659 Presence of unspecified artificial knee joint: Secondary | ICD-10-CM | POA: Diagnosis not present

## 2018-05-14 DIAGNOSIS — D500 Iron deficiency anemia secondary to blood loss (chronic): Secondary | ICD-10-CM | POA: Diagnosis not present

## 2018-05-16 DIAGNOSIS — I10 Essential (primary) hypertension: Secondary | ICD-10-CM | POA: Diagnosis not present

## 2018-05-16 DIAGNOSIS — K219 Gastro-esophageal reflux disease without esophagitis: Secondary | ICD-10-CM | POA: Diagnosis not present

## 2018-05-16 DIAGNOSIS — M1712 Unilateral primary osteoarthritis, left knee: Secondary | ICD-10-CM | POA: Diagnosis not present

## 2018-05-16 DIAGNOSIS — W19XXXD Unspecified fall, subsequent encounter: Secondary | ICD-10-CM | POA: Diagnosis not present

## 2018-05-22 ENCOUNTER — Other Ambulatory Visit: Payer: Self-pay | Admitting: *Deleted

## 2018-05-22 NOTE — Patient Outreach (Signed)
[facility]) Care Management  05/22/2018  Claire West 16-Jul-1941 [id number]   Collaboration with Wilson, Christinia Gully, RN. She reports this patient is admitted to [facility] under a bundle package.  She will be followed by the [facility] bundle care management team.   This patient is eligible for [facility] care management services and [facility] RNCM can consulted if any [facility] community care managemnent needs arise.  Plan to sign off.  Royetta Crochet. Laymond Purser, RN, BSN, [place] [phone]) Business Cell  [phone]) Toll Free Office

## 2018-05-26 DIAGNOSIS — N342 Other urethritis: Secondary | ICD-10-CM | POA: Diagnosis not present

## 2018-05-26 DIAGNOSIS — Z6821 Body mass index (BMI) 21.0-21.9, adult: Secondary | ICD-10-CM | POA: Diagnosis not present

## 2018-05-26 DIAGNOSIS — R238 Other skin changes: Secondary | ICD-10-CM | POA: Diagnosis not present

## 2018-05-26 DIAGNOSIS — R739 Hyperglycemia, unspecified: Secondary | ICD-10-CM | POA: Diagnosis not present

## 2018-05-27 DIAGNOSIS — M25662 Stiffness of left knee, not elsewhere classified: Secondary | ICD-10-CM | POA: Diagnosis not present

## 2018-05-27 DIAGNOSIS — R2689 Other abnormalities of gait and mobility: Secondary | ICD-10-CM | POA: Diagnosis not present

## 2018-05-27 DIAGNOSIS — M6281 Muscle weakness (generalized): Secondary | ICD-10-CM | POA: Diagnosis not present

## 2018-05-27 DIAGNOSIS — Z471 Aftercare following joint replacement surgery: Secondary | ICD-10-CM | POA: Diagnosis not present

## 2018-05-27 DIAGNOSIS — Z96652 Presence of left artificial knee joint: Secondary | ICD-10-CM | POA: Diagnosis not present

## 2018-05-29 DIAGNOSIS — Z471 Aftercare following joint replacement surgery: Secondary | ICD-10-CM | POA: Diagnosis not present

## 2018-05-29 DIAGNOSIS — M6281 Muscle weakness (generalized): Secondary | ICD-10-CM | POA: Diagnosis not present

## 2018-05-29 DIAGNOSIS — M25662 Stiffness of left knee, not elsewhere classified: Secondary | ICD-10-CM | POA: Diagnosis not present

## 2018-05-29 DIAGNOSIS — Z96652 Presence of left artificial knee joint: Secondary | ICD-10-CM | POA: Diagnosis not present

## 2018-05-29 DIAGNOSIS — R2689 Other abnormalities of gait and mobility: Secondary | ICD-10-CM | POA: Diagnosis not present

## 2018-06-02 DIAGNOSIS — M6281 Muscle weakness (generalized): Secondary | ICD-10-CM | POA: Diagnosis not present

## 2018-06-02 DIAGNOSIS — Z471 Aftercare following joint replacement surgery: Secondary | ICD-10-CM | POA: Diagnosis not present

## 2018-06-02 DIAGNOSIS — R739 Hyperglycemia, unspecified: Secondary | ICD-10-CM | POA: Diagnosis not present

## 2018-06-02 DIAGNOSIS — R2689 Other abnormalities of gait and mobility: Secondary | ICD-10-CM | POA: Diagnosis not present

## 2018-06-02 DIAGNOSIS — M25662 Stiffness of left knee, not elsewhere classified: Secondary | ICD-10-CM | POA: Diagnosis not present

## 2018-06-02 DIAGNOSIS — Z96652 Presence of left artificial knee joint: Secondary | ICD-10-CM | POA: Diagnosis not present

## 2018-06-02 DIAGNOSIS — R7309 Other abnormal glucose: Secondary | ICD-10-CM | POA: Diagnosis not present

## 2018-06-04 DIAGNOSIS — Z471 Aftercare following joint replacement surgery: Secondary | ICD-10-CM | POA: Diagnosis not present

## 2018-06-04 DIAGNOSIS — M6281 Muscle weakness (generalized): Secondary | ICD-10-CM | POA: Diagnosis not present

## 2018-06-04 DIAGNOSIS — Z96652 Presence of left artificial knee joint: Secondary | ICD-10-CM | POA: Diagnosis not present

## 2018-06-04 DIAGNOSIS — M25662 Stiffness of left knee, not elsewhere classified: Secondary | ICD-10-CM | POA: Diagnosis not present

## 2018-06-04 DIAGNOSIS — R2689 Other abnormalities of gait and mobility: Secondary | ICD-10-CM | POA: Diagnosis not present

## 2018-06-06 DIAGNOSIS — Z96652 Presence of left artificial knee joint: Secondary | ICD-10-CM | POA: Diagnosis not present

## 2018-06-06 DIAGNOSIS — M25662 Stiffness of left knee, not elsewhere classified: Secondary | ICD-10-CM | POA: Diagnosis not present

## 2018-06-06 DIAGNOSIS — R2689 Other abnormalities of gait and mobility: Secondary | ICD-10-CM | POA: Diagnosis not present

## 2018-06-06 DIAGNOSIS — M6281 Muscle weakness (generalized): Secondary | ICD-10-CM | POA: Diagnosis not present

## 2018-06-06 DIAGNOSIS — Z471 Aftercare following joint replacement surgery: Secondary | ICD-10-CM | POA: Diagnosis not present

## 2018-06-10 DIAGNOSIS — M1712 Unilateral primary osteoarthritis, left knee: Secondary | ICD-10-CM | POA: Diagnosis not present

## 2018-06-10 DIAGNOSIS — Z471 Aftercare following joint replacement surgery: Secondary | ICD-10-CM | POA: Diagnosis not present

## 2018-06-10 DIAGNOSIS — M25662 Stiffness of left knee, not elsewhere classified: Secondary | ICD-10-CM | POA: Diagnosis not present

## 2018-06-10 DIAGNOSIS — R2689 Other abnormalities of gait and mobility: Secondary | ICD-10-CM | POA: Diagnosis not present

## 2018-06-10 DIAGNOSIS — Z96652 Presence of left artificial knee joint: Secondary | ICD-10-CM | POA: Diagnosis not present

## 2018-06-10 DIAGNOSIS — M6281 Muscle weakness (generalized): Secondary | ICD-10-CM | POA: Diagnosis not present

## 2018-06-13 DIAGNOSIS — M25662 Stiffness of left knee, not elsewhere classified: Secondary | ICD-10-CM | POA: Diagnosis not present

## 2018-06-13 DIAGNOSIS — M6281 Muscle weakness (generalized): Secondary | ICD-10-CM | POA: Diagnosis not present

## 2018-06-13 DIAGNOSIS — Z471 Aftercare following joint replacement surgery: Secondary | ICD-10-CM | POA: Diagnosis not present

## 2018-06-13 DIAGNOSIS — R2689 Other abnormalities of gait and mobility: Secondary | ICD-10-CM | POA: Diagnosis not present

## 2018-06-13 DIAGNOSIS — Z96652 Presence of left artificial knee joint: Secondary | ICD-10-CM | POA: Diagnosis not present

## 2018-06-16 DIAGNOSIS — Z471 Aftercare following joint replacement surgery: Secondary | ICD-10-CM | POA: Diagnosis not present

## 2018-06-16 DIAGNOSIS — M6281 Muscle weakness (generalized): Secondary | ICD-10-CM | POA: Diagnosis not present

## 2018-06-16 DIAGNOSIS — M25662 Stiffness of left knee, not elsewhere classified: Secondary | ICD-10-CM | POA: Diagnosis not present

## 2018-06-16 DIAGNOSIS — Z96652 Presence of left artificial knee joint: Secondary | ICD-10-CM | POA: Diagnosis not present

## 2018-06-16 DIAGNOSIS — R2689 Other abnormalities of gait and mobility: Secondary | ICD-10-CM | POA: Diagnosis not present

## 2018-06-19 DIAGNOSIS — M25662 Stiffness of left knee, not elsewhere classified: Secondary | ICD-10-CM | POA: Diagnosis not present

## 2018-06-19 DIAGNOSIS — Z471 Aftercare following joint replacement surgery: Secondary | ICD-10-CM | POA: Diagnosis not present

## 2018-06-19 DIAGNOSIS — R2689 Other abnormalities of gait and mobility: Secondary | ICD-10-CM | POA: Diagnosis not present

## 2018-06-19 DIAGNOSIS — M6281 Muscle weakness (generalized): Secondary | ICD-10-CM | POA: Diagnosis not present

## 2018-06-19 DIAGNOSIS — Z96652 Presence of left artificial knee joint: Secondary | ICD-10-CM | POA: Diagnosis not present

## 2018-06-23 DIAGNOSIS — M25662 Stiffness of left knee, not elsewhere classified: Secondary | ICD-10-CM | POA: Diagnosis not present

## 2018-06-23 DIAGNOSIS — M6281 Muscle weakness (generalized): Secondary | ICD-10-CM | POA: Diagnosis not present

## 2018-06-23 DIAGNOSIS — Z471 Aftercare following joint replacement surgery: Secondary | ICD-10-CM | POA: Diagnosis not present

## 2018-06-23 DIAGNOSIS — R2689 Other abnormalities of gait and mobility: Secondary | ICD-10-CM | POA: Diagnosis not present

## 2018-06-23 DIAGNOSIS — Z96652 Presence of left artificial knee joint: Secondary | ICD-10-CM | POA: Diagnosis not present

## 2018-06-24 DIAGNOSIS — E539 Vitamin B deficiency, unspecified: Secondary | ICD-10-CM | POA: Diagnosis not present

## 2018-06-24 DIAGNOSIS — E876 Hypokalemia: Secondary | ICD-10-CM | POA: Diagnosis not present

## 2018-06-24 DIAGNOSIS — M13842 Other specified arthritis, left hand: Secondary | ICD-10-CM | POA: Diagnosis not present

## 2018-06-24 DIAGNOSIS — Z1389 Encounter for screening for other disorder: Secondary | ICD-10-CM | POA: Diagnosis not present

## 2018-06-24 DIAGNOSIS — E538 Deficiency of other specified B group vitamins: Secondary | ICD-10-CM | POA: Diagnosis not present

## 2018-06-24 DIAGNOSIS — Z6821 Body mass index (BMI) 21.0-21.9, adult: Secondary | ICD-10-CM | POA: Diagnosis not present

## 2018-06-25 DIAGNOSIS — R2689 Other abnormalities of gait and mobility: Secondary | ICD-10-CM | POA: Diagnosis not present

## 2018-06-25 DIAGNOSIS — M6281 Muscle weakness (generalized): Secondary | ICD-10-CM | POA: Diagnosis not present

## 2018-06-25 DIAGNOSIS — M25662 Stiffness of left knee, not elsewhere classified: Secondary | ICD-10-CM | POA: Diagnosis not present

## 2018-06-25 DIAGNOSIS — Z96652 Presence of left artificial knee joint: Secondary | ICD-10-CM | POA: Diagnosis not present

## 2018-06-25 DIAGNOSIS — Z471 Aftercare following joint replacement surgery: Secondary | ICD-10-CM | POA: Diagnosis not present

## 2018-06-27 DIAGNOSIS — M6281 Muscle weakness (generalized): Secondary | ICD-10-CM | POA: Diagnosis not present

## 2018-06-27 DIAGNOSIS — Z96652 Presence of left artificial knee joint: Secondary | ICD-10-CM | POA: Diagnosis not present

## 2018-06-27 DIAGNOSIS — Z471 Aftercare following joint replacement surgery: Secondary | ICD-10-CM | POA: Diagnosis not present

## 2018-06-27 DIAGNOSIS — M25662 Stiffness of left knee, not elsewhere classified: Secondary | ICD-10-CM | POA: Diagnosis not present

## 2018-06-27 DIAGNOSIS — R2689 Other abnormalities of gait and mobility: Secondary | ICD-10-CM | POA: Diagnosis not present

## 2018-07-04 DIAGNOSIS — M1991 Primary osteoarthritis, unspecified site: Secondary | ICD-10-CM | POA: Diagnosis not present

## 2018-07-04 DIAGNOSIS — E782 Mixed hyperlipidemia: Secondary | ICD-10-CM | POA: Diagnosis not present

## 2018-07-04 DIAGNOSIS — Z6822 Body mass index (BMI) 22.0-22.9, adult: Secondary | ICD-10-CM | POA: Diagnosis not present

## 2018-07-04 DIAGNOSIS — Z0001 Encounter for general adult medical examination with abnormal findings: Secondary | ICD-10-CM | POA: Diagnosis not present

## 2018-07-04 DIAGNOSIS — I10 Essential (primary) hypertension: Secondary | ICD-10-CM | POA: Diagnosis not present

## 2018-07-04 DIAGNOSIS — R300 Dysuria: Secondary | ICD-10-CM | POA: Diagnosis not present

## 2018-07-04 DIAGNOSIS — Z1389 Encounter for screening for other disorder: Secondary | ICD-10-CM | POA: Diagnosis not present

## 2018-07-07 ENCOUNTER — Encounter: Payer: Self-pay | Admitting: Neurology

## 2018-07-07 ENCOUNTER — Ambulatory Visit (INDEPENDENT_AMBULATORY_CARE_PROVIDER_SITE_OTHER): Payer: Medicare Other | Admitting: Neurology

## 2018-07-07 VITALS — BP 177/95 | HR 81 | Wt 130.5 lb

## 2018-07-07 DIAGNOSIS — G3184 Mild cognitive impairment, so stated: Secondary | ICD-10-CM

## 2018-07-07 DIAGNOSIS — R410 Disorientation, unspecified: Secondary | ICD-10-CM | POA: Diagnosis not present

## 2018-07-07 NOTE — Patient Instructions (Signed)

## 2018-07-07 NOTE — Progress Notes (Addendum)
Provider:  Larey Seat, M D  Referring Provider: Sharilyn Sites, MD Primary Care Physician:  Sharilyn Sites, MD  Chief Complaint  Patient presents with  . Follow-up    MEtabolic encephalopathy and memory follow up room 11 pt with [name] her son- RN Katrina    HPI:  Claire West is a 77 y.o. female is seen in a RV with son and caretaker CNA, 07-07-2018. I had the pleasure of seeing this patient on December 24, 2017 the day before her 58 first day.  See history below.  We held off after our last visit on starting any kind of memory supportive medication as I was not sure that the patient has a ongoing chronic metabolic encephalopathy or rather a delirium but had something to do with the medication and conditions she was treated in the hospital for.  She also had acute electrolyte imbalances which can cause confusion memory loss and sometimes even pseudo-psychotic episodes.  Today she performed well on a Mini-Mental Status Examination she was fully oriented to [facility] [place] and state, she knew it was 45 September but not sure about the day of the week.  She was able to spell a word backwards which gave her 4 out of 5 possible points she recalled 3 immediate recall words, she follows all commands fluently and performed also verbal and written commands.  Visual-spatial orientation was intact. She has daytime caregivers, urine incontinence is a problem, and she forgets medications ,meals, but she is not longer in delirium. .   Inspite of this MMSE was 27/30 points today! Her husband is in memory care, her brother in law had frontal dementia, a sister in law with lymphoma.   She has undergone a knee replacement and has undergone exercises and PT concluded just last week , walks with a walker. She feels unsteady- She has a swollen left leg, with a hematoma under the surgical knee site.   MMSE - Mini Mental State Exam 07/07/2018 12/24/2017  Orientation to time 4 4  Orientation to  Place 5 4  Registration 3 3  Attention/ Calculation 3 4  Recall 3 2  Language- name 2 objects 2 1  Language- repeat 1 1  Language- follow 3 step command 3 3  Language- read & follow direction 1 1  Write a sentence 1 1  Copy design 1 0  Total score 27 24                                                              28/ 30 - I used WORLD backwards instead of serial 7s.   I added a MOCA  21/ 30 points- this is indicative of early dementia.     HRI :Consult for this 77 year old female patient who is seen here as a referral from Dr. Hilma Favors for follow up on a metabolic encephalopathy late January and early February 2019, and earlier an episode in November 2018. shehas a history of HTN, osteoarthritis and 2 strokes.  Neurology  I have the pleasure of seeing Claire West  on 24 December [birth year], 77 year old Caucasian right-handed female patient of Dr. Hilma Favors presents after a hospital admission between January 30 and November 08, 2017 when she was diagnosed with acute metabolic encephalopathy  hyponatremia, hypokalemia.  It was assumed that blood pressure medications which she has taken for over a decade had caused the electrolyte imbalance.  There were also previous episodes of low potassium in late Ultram 2018 noticed.  I would like to "her current medications which include aspirin, Citrucel, multivitamins, calcium, tramadol, oxybutynin chloride, omeprazole, meloxicam, escitalopram, losartan, hydrochlorothiazide and metoprolol.  She no longer takes meloxicam, tramadol , oxybutynin. She feels better but she still has sundowning, asks more repetitive questions to wards the evening, more forgetful.   She is often frustrated and her family is concerned about her driving. The patient's Blood pressure control is very important to slow the progression of neuro micro-vascular disease.  I will revisit with her in 4 month and offered in the meanwhile start her on Namenda , with the goal to start aricept as addition.  She is at this time inclined to wait, have repeat memory testing and would than decide in 4 month to start medication or not.      Review of Systems: Out of a complete 14 system review, the patient complains of only the following symptoms, and all other reviewed systems are negative.  confusion, repetitive questions, walking unsteadiness. Cane - knee is "Bone on bone"   Social History   Socioeconomic History  . Marital status: Married    Spouse name: Not on file  . Number of children: Not on file  . Years of education: Not on file  . Highest education level: Not on file  Occupational History  . Not on file  Social Needs  . Financial resource strain: Not on file  . Food insecurity:    Worry: Not on file    Inability: Not on file  . Transportation needs:    Medical: Not on file    Non-medical: Not on file  Tobacco Use  . Smoking status: Never Smoker  . Smokeless tobacco: Never Used  Substance and Sexual Activity  . Alcohol use: No  . Drug use: No  . Sexual activity: Never  Lifestyle  . Physical activity:    Days per week: Not on file    Minutes per session: Not on file  . Stress: Not on file  Relationships  . Social connections:    Talks on phone: Not on file    Gets together: Not on file    Attends religious service: Not on file    Active member of club or organization: Not on file    Attends meetings of clubs or organizations: Not on file    Relationship status: Not on file  . Intimate partner violence:    Fear of current or ex partner: Not on file    Emotionally abused: Not on file    Physically abused: Not on file    Forced sexual activity: Not on file  Other Topics Concern  . Not on file  Social History Narrative  . Not on file    Family History  Problem Relation Age of Onset  . Lung cancer Mother   . Lung cancer Sister     Past Medical History:  Diagnosis Date  . Depression   . Gait instability    walks with cane  . GERD (gastroesophageal reflux  disease)   . Hyperlipemia   . Hypertension   . Knee pain   . Mild cognitive impairment with memory loss   . Seasonal allergies     Past Surgical History:  Procedure Laterality Date  . ABDOMINAL HYSTERECTOMY    .  ARTHROSCOPIC REPAIR ACL    . COLONOSCOPY    . COLONOSCOPY N/A 11/23/2015   Procedure: COLONOSCOPY;  Surgeon: Rogene Houston, MD;  Location: [facility];  Service: Endoscopy;  Laterality: N/A;  830  . EYE SURGERY Bilateral    Cataracts  . TOTAL KNEE ARTHROPLASTY Left 05/05/2018   Procedure: LEFT TOTAL KNEE ARTHROPLASTY;  Surgeon: Gaynelle Arabian, MD;  Location: [facility];  Service: Orthopedics;  Laterality: Left;  with block    Current Outpatient Medications  Medication Sig Dispense Refill  . cholecalciferol (VITAMIN D) 1000 units tablet Take 1,000 Units by mouth daily at 12 noon.    . escitalopram (LEXAPRO) 10 MG tablet Take 10 mg by mouth at bedtime.  1  . fexofenadine (ALLEGRA) 180 MG tablet Take 180 mg by mouth daily.    Javier Docker Oil 500 MG CAPS Take 500 mg by mouth daily at 12 noon.    . methocarbamol (ROBAXIN) 500 MG tablet Take 1 tablet (500 mg total) by mouth every 6 (six) hours as needed for muscle spasms. 40 tablet 0  . metoprolol succinate (TOPROL-XL) 100 MG 24 hr tablet Take 100 mg by mouth 2 (two) times daily. Take with or immediately following a meal.    . Multiple Vitamin (MULTIVITAMIN WITH MINERALS) TABS tablet Take 1 tablet by mouth daily. Women's Multivitamin    . omeprazole (PRILOSEC) 20 MG capsule Take 20 mg by mouth daily before breakfast.     . potassium chloride (K-DUR) 10 MEQ tablet Take 1 tablet (10 mEq total) by mouth daily. (Patient taking differently: Take 10 mEq by mouth daily at 12 noon. ) 5 tablet 0  . Probiotic Product (PROBIOTIC PO) Take 1 capsule by mouth daily. [place]    . valsartan (DIOVAN) 320 MG tablet Take 320 mg by mouth daily at 3 pm.      No current facility-administered medications for this visit.     Allergies as of 07/07/2018 -  Review Complete 07/07/2018  Allergen Reaction Noted  . Codeine Hives and Rash 07/08/2012  . Neosporin [neomycin-bacitracin zn-polymyx] Rash 04/23/2018  . Penicillins Rash 07/08/2012  . Sulfa antibiotics Hives and Rash 07/08/2012     CLINICAL DATA:  Initial evaluation for acute altered mental status, confusion, lethargy.  EXAM: MRI HEAD WITHOUT CONTRAST  TECHNIQUE: Multiplanar, multiecho pulse sequences of the brain and surrounding structures were obtained without intravenous contrast.  COMPARISON:  Prior CT from 11/04/2017.  FINDINGS: Brain: Generalized age-related cerebral atrophy. Extensive T2/FLAIR hyperintensity involving the periventricular and deep white matter both cerebral hemispheres, most consistent with chronic microvascular ischemic changes. Chronic microvascular disease present within the pons as well. Superimposed remote lacunar infarcts present within the bilateral basal ganglia, deep white matter, and right thalamus.  No abnormal foci of restricted diffusion to suggest acute or subacute ischemia. Gray-white matter differentiation maintained. No evidence for remote cortical infarction. No acute intracranial hemorrhage. Small chronic microhemorrhage noted within the right thalamus.  No mass lesion, midline shift or mass effect. No hydrocephalus. No extra-axial fluid collection. Major dural sinuses are grossly patent.  Pituitary gland suprasellar region normal. Midline structures intact and normal.  Vascular: Major intracranial vascular flow voids are maintained at the skull base.  Skull and upper cervical spine: Craniocervical junction within normal limits. Mild degenerate spondylolysis noted within the upper cervical spine without significant stenosis. Bone marrow signal intensity within normal limits. No scalp soft tissue abnormality.  Sinuses/Orbits: Globes and orbital soft tissues within normal limits. Patient status post cataract  extraction  bilaterally. Scattered mucosal thickening within the ethmoidal air cells and maxillary sinuses. Paranasal sinuses are otherwise clear. No significant mastoid effusion. Inner ear structures grossly normal.  Other: None.  IMPRESSION: 1. No acute intracranial abnormality. 2. Generalized age-related cerebral atrophy with advanced chronic microvascular ischemic disease with scattered remote lacunar infarcts as above.   Electronically Signed   By: Jeannine Boga M.D.   On: 11/06/2017 20:52    Vitals: BP (!) 177/95 (BP Location: Right Arm, Patient Position: Sitting, Cuff Size: Normal)   Pulse 81   Wt 130 lb 8 oz (59.2 kg)   BMI 21.06 kg/m  Last Weight:  Wt Readings from Last 1 Encounters:  07/07/18 130 lb 8 oz (59.2 kg)   Last Height:   Ht Readings from Last 1 Encounters:  05/05/18 5\' 6"  (1.676 m)    Physical exam:  General: The patient is awake, alert and appears not in acute distress. The patient is well groomed. Head: Normocephalic, atraumatic. Neck is supple. Mallampati 1, neck circumference:14- Cardiovascular:  Regular rate and rhythm , without  murmurs or carotid bruit, and without distended neck veins. Respiratory: Lungs are clear to auscultation. Skin:  Without evidence of edema, or rash Trunk: stooped posture.  Neurologic exam : The patient is awake and alert, oriented to place and time.  Memory subjective described as ' slow " There is a normal attention span & concentration ability-" small talk". Speech is fluent without dysarthria, dysphonia or aphasia. Mood and affect are appropriate.  MMSE - Mini Mental State Exam 07/07/2018 12/24/2017  Orientation to time 4 4  Orientation to Place 5 4  Registration 3 3  Attention/ Calculation 2 4  Recall 3 2  Language- name 2 objects 2 1  Language- repeat 1 1  Language- follow 3 step command 3 3  Language- read & follow direction 1 1  Write a sentence 1 1  Copy design 1 0  Total score 26 24   I  added MOCA testing. The patient was well able to solve trail making and clock drawing, and named 2 of 3 exotic animals .She failed at copying a cube.  Cranial nerves: Pupils are equal and briskly reactive to light. Extraocular movements  in vertical and horizontal planes intact and without nystagmus.  Visual fields by finger perimetry are intact. Hearing to finger rub intact.  Facial sensation intact to fine touch. Facial motor strength is symmetric and tongue and uvula move midline. Tongue protrusion into either cheek is normal. Shoulder shrug is normal.   Motor exam:   Normal tone ,muscle bulk and symmetric strength in all extremities. Her knee buckles- she is unsteady . But she has good strength.   Sensory:  Fine touch, pinprick and vibration were tested in all extremities. Proprioception was abnormal  Coordination: Rapid alternating movements in the fingers/hands were normal.  Finger-to-nose maneuver  normal without evidence of ataxia, dysmetria or tremor.  Gait and station: Patient walks with a cane for  assistive device. Strength within normal limits.  Smaller step width.  Stance is wider based - she turned with 5 steps and used her cane. No hand tremor noticed, normal arm swing.  Deep tendon reflexes: in the  upper and lower extremities are symmetric 2 plus - and intact.  Babinski maneuver response is downgoing.    Assessment:  Dear Dr. Hilma Favors,  After physical and neurologic examination, review of laboratory studies, imaging, neurophysiology testing and pre-existing records, assessment of our mutual patient is  that of :  Advanced microvascular diease and recent admission for metabolic encephalopathy- patient's main risk factor is hypertension. No hx of DM.   Early dementia cannot be ruled out, she doesn't even have enough point loss for MCI on MMSE but clearly had not the stamina to succeed on a MOCA  ( 21 /30 ) points. - there is advanced atrophy with widened temporal sulci by  brain MRI. Episodic loss of memory, amnestic spells. I woud use early dementia as a diagnosis.   Gait disorder related to advanced arthritis, knee replacement. She finished with PT.    Plan:  Treatment plan and additional workup :  NPI :  4-6 month with MOCA/ MMSE and consider Namenda / Aricept if indicated.     Asencion Partridge Barkley Kratochvil MD 07/07/2018

## 2018-07-23 ENCOUNTER — Other Ambulatory Visit: Payer: Self-pay

## 2018-07-23 ENCOUNTER — Emergency Department (HOSPITAL_COMMUNITY): Payer: Medicare Other

## 2018-07-23 ENCOUNTER — Emergency Department (HOSPITAL_COMMUNITY)
Admission: EM | Admit: 2018-07-23 | Discharge: 2018-07-23 | Disposition: A | Discharge status: Home / Self Care | Payer: Medicare Other | Attending: Emergency Medicine | Admitting: Emergency Medicine

## 2018-07-23 ENCOUNTER — Encounter (HOSPITAL_COMMUNITY): Payer: Self-pay | Admitting: Emergency Medicine

## 2018-07-23 DIAGNOSIS — Z79899 Other long term (current) drug therapy: Secondary | ICD-10-CM | POA: Insufficient documentation

## 2018-07-23 DIAGNOSIS — W01198A Fall on same level from slipping, tripping and stumbling with subsequent striking against other object, initial encounter: Secondary | ICD-10-CM | POA: Insufficient documentation

## 2018-07-23 DIAGNOSIS — Y929 Unspecified place or not applicable: Secondary | ICD-10-CM | POA: Diagnosis not present

## 2018-07-23 DIAGNOSIS — F329 Major depressive disorder, single episode, unspecified: Secondary | ICD-10-CM | POA: Insufficient documentation

## 2018-07-23 DIAGNOSIS — M25561 Pain in right knee: Secondary | ICD-10-CM | POA: Diagnosis not present

## 2018-07-23 DIAGNOSIS — S0003XA Contusion of scalp, initial encounter: Secondary | ICD-10-CM | POA: Diagnosis not present

## 2018-07-23 DIAGNOSIS — S0083XA Contusion of other part of head, initial encounter: Secondary | ICD-10-CM | POA: Diagnosis not present

## 2018-07-23 DIAGNOSIS — I10 Essential (primary) hypertension: Secondary | ICD-10-CM | POA: Insufficient documentation

## 2018-07-23 DIAGNOSIS — Y9389 Activity, other specified: Secondary | ICD-10-CM | POA: Diagnosis not present

## 2018-07-23 DIAGNOSIS — N3000 Acute cystitis without hematuria: Secondary | ICD-10-CM | POA: Diagnosis not present

## 2018-07-23 DIAGNOSIS — S8992XA Unspecified injury of left lower leg, initial encounter: Secondary | ICD-10-CM | POA: Diagnosis not present

## 2018-07-23 DIAGNOSIS — S0990XA Unspecified injury of head, initial encounter: Secondary | ICD-10-CM | POA: Diagnosis present

## 2018-07-23 DIAGNOSIS — Y998 Other external cause status: Secondary | ICD-10-CM | POA: Insufficient documentation

## 2018-07-23 DIAGNOSIS — Z96652 Presence of left artificial knee joint: Secondary | ICD-10-CM | POA: Insufficient documentation

## 2018-07-23 DIAGNOSIS — S8991XA Unspecified injury of right lower leg, initial encounter: Secondary | ICD-10-CM | POA: Diagnosis not present

## 2018-07-23 LAB — URINALYSIS, ROUTINE W REFLEX MICROSCOPIC
BILIRUBIN URINE: NEGATIVE
Bacteria, UA: NONE SEEN
Glucose, UA: NEGATIVE mg/dL
HGB URINE DIPSTICK: NEGATIVE
Ketones, ur: NEGATIVE mg/dL
NITRITE: NEGATIVE
PROTEIN: NEGATIVE mg/dL
Specific Gravity, Urine: 1.006 (ref 1.005–1.030)
pH: 8 (ref 5.0–8.0)

## 2018-07-23 MED ORDER — NITROFURANTOIN MONOHYD MACRO 100 MG PO CAPS: 100.0000 mg | ORAL_CAPSULE | Freq: Two times a day (BID) | ORAL | 0 refills | Status: DC

## 2018-07-23 MED ORDER — CIPROFLOXACIN HCL 250 MG PO TABS
250.0000 mg | ORAL_TABLET | Freq: Once | ORAL | Status: AC
  Administered 2018-07-23: 250 mg via ORAL
  Filled 2018-07-23: qty 1

## 2018-07-23 MED ORDER — CIPROFLOXACIN HCL 250 MG PO TABS: 250.0000 mg | ORAL_TABLET | Freq: Two times a day (BID) | ORAL | 0 refills | Status: DC

## 2018-07-23 MED ORDER — CIPROFLOXACIN HCL 250 MG PO TABS: 250.0000 mg | ORAL_TABLET | Freq: Two times a day (BID) | ORAL | 0 refills | Status: AC

## 2018-07-23 NOTE — ED Notes (Signed)
Patient given ginger ale to drink 

## 2018-07-23 NOTE — Discharge Instructions (Addendum)
Take the medications as prescribed, you can also take over-the-counter medications as needed for your pain and discomfort, monitor for fever vomiting, worsening symptoms

## 2018-07-23 NOTE — ED Provider Notes (Addendum)
[facility] Provider Note   CSN: [id number] Arrival date & time: 07/23/18  1523     History   Chief Complaint Chief Complaint  Patient presents with  . Fall    HPI Claire West is a 77 y.o. female.  HPI Patient presents to the emergency room for evaluation of injuries associated with a fall.  Patient was getting out of her car.  She lost her balance and fell to the ground striking her face on a curb.  She also struck her knees on the ground.  Patient denies any loss of consciousness.  She was not feeling weak or sick before the fall.  She does have pain now in her bilateral knees.  She is also having pain and swelling on the right side of her face and forehead.  She denies taking any blood thinning agents.  She denies any chest pain or shortness of breath.  No hip pain.  She sustained some minor abrasions to her hands but does not have any significant pain in her upper extremities. Past Medical History:  Diagnosis Date  . Depression   . Gait instability    walks with cane  . GERD (gastroesophageal reflux disease)   . Hyperlipemia   . Hypertension   . Knee pain   . Mild cognitive impairment with memory loss   . Seasonal allergies     Patient Active Problem List   Diagnosis Date Noted  . Delirium in remission 07/07/2018  . OA (osteoarthritis) of knee 05/05/2018  . Amnestic MCI (mild cognitive impairment with memory loss) 12/24/2017  . Acute metabolic encephalopathy 40/98/1191  . Altered mental status 11/06/2017  . Hyponatremia 02/12/2016  . Hypokalemia 02/12/2016  . Dehydration 02/12/2016  . Elevated LFTs 02/12/2016  . HTN (hypertension) 02/12/2016  . GERD (gastroesophageal reflux disease) 02/12/2016    Past Surgical History:  Procedure Laterality Date  . ABDOMINAL HYSTERECTOMY    . ARTHROSCOPIC REPAIR ACL    . COLONOSCOPY    . COLONOSCOPY N/A 11/23/2015   Procedure: COLONOSCOPY;  Surgeon: Rogene Houston, MD;  Location: [facility];   Service: Endoscopy;  Laterality: N/A;  830  . EYE SURGERY Bilateral    Cataracts  . TOTAL KNEE ARTHROPLASTY Left 05/05/2018   Procedure: LEFT TOTAL KNEE ARTHROPLASTY;  Surgeon: Gaynelle Arabian, MD;  Location: [facility];  Service: Orthopedics;  Laterality: Left;  with block     OB History    Gravida  1   Para  1   Term  1   Preterm      AB      Living  1     SAB      TAB      Ectopic      Multiple      Live Births               Home Medications    Prior to Admission medications   Medication Sig Start Date End Date Taking? Authorizing Provider  cholecalciferol (VITAMIN D) 1000 units tablet Take 1,000 Units by mouth daily at 12 noon.    [provider]  escitalopram (LEXAPRO) 10 MG tablet Take 10 mg by mouth at bedtime. 03/07/18   [provider]  fexofenadine (ALLEGRA) 180 MG tablet Take 180 mg by mouth daily.    [provider]  Javier Docker Oil 500 MG CAPS Take 500 mg by mouth daily at 12 noon.    [provider]  methocarbamol (ROBAXIN) 500 MG  tablet Take 1 tablet (500 mg total) by mouth every 6 (six) hours as needed for muscle spasms. 05/09/18   Edmisten, Kristie L, PA  metoprolol succinate (TOPROL-XL) 100 MG 24 hr tablet Take 100 mg by mouth 2 (two) times daily. Take with or immediately following a meal.    [provider]  Multiple Vitamin (MULTIVITAMIN WITH MINERALS) TABS tablet Take 1 tablet by mouth daily. Women's Multivitamin    [provider]  nitrofurantoin, macrocrystal-monohydrate, (MACROBID) 100 MG capsule Take 1 capsule (100 mg total) by mouth 2 (two) times daily. 07/23/18   Dorie Rank, MD  omeprazole (PRILOSEC) 20 MG capsule Take 20 mg by mouth daily before breakfast.  04/18/15   [provider]  potassium chloride (K-DUR) 10 MEQ tablet Take 1 tablet (10 mEq total) by mouth daily. Patient taking differently: Take 10 mEq by mouth daily at 12 noon.  11/04/17   Noemi Chapel, MD  Probiotic Product  (PROBIOTIC PO) Take 1 capsule by mouth daily. 73 Billion    [provider]  valsartan (DIOVAN) 320 MG tablet Take 320 mg by mouth daily at 3 pm.  12/18/17   [provider]    Family History Family History  Problem Relation Age of Onset  . Lung cancer Mother   . Lung cancer Sister     Social History Social History   Tobacco Use  . Smoking status: Never Smoker  . Smokeless tobacco: Never Used  Substance Use Topics  . Alcohol use: No  . Drug use: No     Allergies   Codeine; Neosporin [neomycin-bacitracin zn-polymyx]; Penicillins; and Sulfa antibiotics   Review of Systems Review of Systems  All other systems reviewed and are negative.    Physical Exam Updated Vital Signs BP (!) 187/81 (BP Location: Left Arm)   Pulse 85   Temp 97.7 F (36.5 C) (Oral)   Resp 18   Ht 1.676 m (5\' 6" )   Wt 58.5 kg   SpO2 97%   BMI 20.82 kg/m   Physical Exam  Constitutional: No distress.  HENT:  Head: Normocephalic.  Right Ear: External ear normal.  Left Ear: External ear normal.  Contusion of the right forehead, contusion to the right upper cheek with some abrasions  Eyes: Conjunctivae are normal. Right eye exhibits no discharge. Left eye exhibits no discharge. No scleral icterus.  Neck: Neck supple. No tracheal deviation present.  Cardiovascular: Normal rate, regular rhythm and intact distal pulses.  Pulmonary/Chest: Effort normal and breath sounds normal. No stridor. No respiratory distress. She has no wheezes. She has no rales.  Abdominal: Soft. Bowel sounds are normal. She exhibits no distension. There is no tenderness. There is no rebound and no guarding.  Musculoskeletal: She exhibits no edema.       Right shoulder: She exhibits no tenderness, no bony tenderness and no swelling.       Left shoulder: She exhibits no tenderness, no bony tenderness and no swelling.       Right wrist: She exhibits no tenderness, no bony tenderness and no swelling.       Left  wrist: She exhibits no tenderness, no bony tenderness and no swelling.       Right hip: She exhibits normal range of motion, no tenderness, no bony tenderness and no swelling.       Left hip: She exhibits normal range of motion, no tenderness and no bony tenderness.       Right knee: Tenderness found.  Left knee: Tenderness found.       Right ankle: She exhibits no swelling. No tenderness.       Left ankle: She exhibits no swelling. No tenderness.       Cervical back: She exhibits no tenderness, no bony tenderness and no swelling.       Thoracic back: She exhibits no tenderness, no bony tenderness and no swelling.       Lumbar back: She exhibits no tenderness, no bony tenderness and no swelling.  Right greater than left abrasions and contusions of the knees, no gross deformity, no laceration  Neurological: She is alert. She has normal strength. No cranial nerve deficit (no facial droop, extraocular movements intact, no slurred speech) or sensory deficit. She exhibits normal muscle tone. She displays no seizure activity. Coordination normal.  Skin: Skin is warm and dry. No rash noted. She is not diaphoretic.  Superficial abrasions noted on the scan of her hands  Psychiatric: She has a normal mood and affect.  Nursing note and vitals reviewed.    ED Treatments / Results  Labs (all labs ordered are listed, but only abnormal results are displayed) Labs Reviewed  URINALYSIS, ROUTINE W REFLEX MICROSCOPIC - Abnormal; Notable for the following components:      Result Value   Color, Urine STRAW (*)    Leukocytes, UA TRACE (*)    All other components within normal limits  URINE CULTURE    EKG None  Radiology Ct Head Wo Contrast  Result Date: 07/23/2018 CLINICAL DATA:  Fall with right facial injury and bruising EXAM: CT HEAD WITHOUT CONTRAST CT MAXILLOFACIAL WITHOUT CONTRAST TECHNIQUE: Multidetector CT imaging of the head and maxillofacial structures were performed using the standard  protocol without intravenous contrast. Multiplanar CT image reconstructions of the maxillofacial structures were also generated. COMPARISON:  01/25/2018 CT FINDINGS: CT HEAD FINDINGS Brain: No acute territorial infarction, hemorrhage or intracranial mass. Focal hypodensity within the right frontal white matter consistent with age indeterminate infarct, new since 01/25/2018. Extensive small vessel ischemic changes of the white matter. Stable ventricle size. Vascular: No hyperdense vessels. Carotid vascular and vertebral artery calcification Skull: No fracture Other: Moderate right frontal scalp hematoma CT MAXILLOFACIAL FINDINGS Osseous: No acute nasal bone fracture. Mandibular heads are normally position. No mandibular fracture. Pterygoid plates and zygomatic arches are intact. The mastoid air cells are without acute fluid. Orbits: Negative. No traumatic or inflammatory finding. Sinuses: Clear. Soft tissues: Moderate right forehead hematoma. Moderate right facial soft tissue swelling and hematoma. Upper cervical spine again demonstrates grade 1 anterolisthesis of C3 on C4. IMPRESSION: 1. Negative for acute intracranial hemorrhage. 2. Focal hypodensity in the right frontal white matter, consistent with age indeterminate infarct, new since 01/25/2018 comparison CT. Atrophy and small vessel ischemic changes of the white matter 3. No acute facial bone fracture. Moderate right forehead and facial soft tissue hematomas. Electronically Signed   By: Donavan Foil M.D.   On: 07/23/2018 19:10   Dg Knee Complete 4 Views Left  Result Date: 07/23/2018 CLINICAL DATA:  Fall EXAM: LEFT KNEE - COMPLETE 4+ VIEW COMPARISON:  None. FINDINGS: Total knee arthroplasty is in place. It is anatomically aligned. No breakage or loosening of the hardware. Osteopenia. IMPRESSION: No acute bony pathology. Electronically Signed   By: Marybelle Killings M.D.   On: 07/23/2018 18:14   Dg Knee Complete 4 Views Right  Result Date:  07/23/2018 CLINICAL DATA:  fall, pain EXAM: RIGHT KNEE - COMPLETE 4+ VIEW COMPARISON:  None. FINDINGS: Severe narrowing  of the lateral compartment. No acute fracture. No dislocation. Osteophyte formation about all 3 compartments is present. Osteopenia. IMPRESSION: No acute bony pathology. Electronically Signed   By: Marybelle Killings M.D.   On: 07/23/2018 18:15   Ct Maxillofacial Wo Contrast  Result Date: 07/23/2018 CLINICAL DATA:  Fall with right facial injury and bruising EXAM: CT HEAD WITHOUT CONTRAST CT MAXILLOFACIAL WITHOUT CONTRAST TECHNIQUE: Multidetector CT imaging of the head and maxillofacial structures were performed using the standard protocol without intravenous contrast. Multiplanar CT image reconstructions of the maxillofacial structures were also generated. COMPARISON:  01/25/2018 CT FINDINGS: CT HEAD FINDINGS Brain: No acute territorial infarction, hemorrhage or intracranial mass. Focal hypodensity within the right frontal white matter consistent with age indeterminate infarct, new since 01/25/2018. Extensive small vessel ischemic changes of the white matter. Stable ventricle size. Vascular: No hyperdense vessels. Carotid vascular and vertebral artery calcification Skull: No fracture Other: Moderate right frontal scalp hematoma CT MAXILLOFACIAL FINDINGS Osseous: No acute nasal bone fracture. Mandibular heads are normally position. No mandibular fracture. Pterygoid plates and zygomatic arches are intact. The mastoid air cells are without acute fluid. Orbits: Negative. No traumatic or inflammatory finding. Sinuses: Clear. Soft tissues: Moderate right forehead hematoma. Moderate right facial soft tissue swelling and hematoma. Upper cervical spine again demonstrates grade 1 anterolisthesis of C3 on C4. IMPRESSION: 1. Negative for acute intracranial hemorrhage. 2. Focal hypodensity in the right frontal white matter, consistent with age indeterminate infarct, new since 01/25/2018 comparison CT. Atrophy  and small vessel ischemic changes of the white matter 3. No acute facial bone fracture. Moderate right forehead and facial soft tissue hematomas. Electronically Signed   By: Donavan Foil M.D.   On: 07/23/2018 19:10    Procedures Procedures (including critical care time)  Medications Ordered in ED Medications  ciprofloxacin (CIPRO) tablet 250 mg (250 mg Oral Given 07/23/18 2023)     Initial Impression / Assessment and Plan / ED Course  I have reviewed the triage vital signs and the nursing notes.  Pertinent labs & imaging results that were available during my care of the patient were reviewed by me and considered in my medical decision making (see chart for details).  Clinical Course as of Jul 23 2028  Wed Jul 23, 2018  1838 Family now mentions urinary frequency recently.  They request a UA   [JK]    Clinical Course User Index [JK] Dorie Rank, MD   No evidence of serious injury associated with thefall.  Consistent with soft tissue injury.  Incidental occult stroke noted on the CT scan.  Discussed this findings with the family.  She does not have any neurologic symptoms, I doubt the CT finding is related to todays event.  Pt has trace LE and WBC.  She has urinary frequency.  Allergic to PCN and septra.  Will dc home with course of macrobid.  Prior renal function was normal.. I explained findings to patient and warning signs that should prompt return to the ED.     Final Clinical Impressions(s) / ED Diagnoses   Final diagnoses:  Contusion of face, initial encounter  Acute cystitis without hematuria    ED Discharge Orders         Ordered    ciprofloxacin (CIPRO) 250 MG tablet  Every 12 hours,   Status:  Discontinued     07/23/18 2024    nitrofurantoin, macrocrystal-monohydrate, (MACROBID) 100 MG capsule  2 times daily     07/23/18 2028  Dorie Rank, MD 07/23/18 2029 Patient states she is now allergic to [facility] as well.  She is also allergic to sulfa drugs and  penicillins.  Cipro is really the only alternative at this point despite the black box warning   Dorie Rank, MD 07/23/18 2040

## 2018-07-23 NOTE — ED Notes (Signed)
Pt given ice pack

## 2018-07-23 NOTE — Progress Notes (Signed)
2 silver "diamond" hoop earring removed by diagnostic, I placed in ziploc bag along with silver necklace with a round charm, bag placed in patient's hands.

## 2018-07-23 NOTE — ED Triage Notes (Addendum)
Pt reports slipping and falling approx 15 min ago. Pt hit RT side of face on concrete. Noticeable bruising, edema, and abrasions noted to face. Denies LOC. Pt also c/o abrasions to bilateral knees. Pt had recent LT knee replacement.

## 2018-07-27 LAB — URINE CULTURE: Culture: >=100000 — AB

## 2018-07-28 ENCOUNTER — Telehealth: Payer: Self-pay | Admitting: Emergency Medicine

## 2018-07-28 NOTE — Telephone Encounter (Signed)
Post ED Visit - Positive Culture Follow-up  Culture report reviewed by antimicrobial stewardship pharmacist:  []  Elenor Quinones, Pharm.D. []  Heide Guile, Pharm.D., BCPS AQ-ID []  Parks Neptune, Pharm.D., BCPS []  Alycia Rossetti, Pharm.D., BCPS []  [place], Pharm.D., BCPS, AAHIVP []  Legrand Como, Pharm.D., BCPS, AAHIVP [x]  Salome Arnt, PharmD, BCPS []  Johnnette Gourd, PharmD, BCPS []  Hughes Better, PharmD, BCPS []  Leeroy Cha, PharmD  Positive urine culture Treated with ciprofloxacin and nitrofurantoin, organism sensitive to the same and no further patient follow-up is required at this time.  Hazle Nordmann 07/28/2018, 9:41 AM

## 2018-08-08 DIAGNOSIS — R350 Frequency of micturition: Secondary | ICD-10-CM | POA: Diagnosis not present

## 2018-08-08 DIAGNOSIS — Z6822 Body mass index (BMI) 22.0-22.9, adult: Secondary | ICD-10-CM | POA: Diagnosis not present

## 2018-08-08 DIAGNOSIS — N390 Urinary tract infection, site not specified: Secondary | ICD-10-CM | POA: Diagnosis not present

## 2018-08-08 DIAGNOSIS — M810 Age-related osteoporosis without current pathological fracture: Secondary | ICD-10-CM | POA: Diagnosis not present

## 2018-08-22 ENCOUNTER — Emergency Department (HOSPITAL_COMMUNITY): Payer: Medicare Other

## 2018-08-22 ENCOUNTER — Other Ambulatory Visit: Payer: Self-pay

## 2018-08-22 ENCOUNTER — Observation Stay (HOSPITAL_COMMUNITY)
Admission: EM | Admit: 2018-08-22 | Discharge: 2018-08-24 | Disposition: A | Discharge status: Home / Self Care | Payer: Medicare Other | Attending: Internal Medicine | Admitting: Internal Medicine

## 2018-08-22 ENCOUNTER — Encounter (HOSPITAL_COMMUNITY): Payer: Self-pay

## 2018-08-22 DIAGNOSIS — I10 Essential (primary) hypertension: Secondary | ICD-10-CM | POA: Diagnosis not present

## 2018-08-22 DIAGNOSIS — G459 Transient cerebral ischemic attack, unspecified: Secondary | ICD-10-CM

## 2018-08-22 DIAGNOSIS — Z96652 Presence of left artificial knee joint: Secondary | ICD-10-CM | POA: Insufficient documentation

## 2018-08-22 DIAGNOSIS — M179 Osteoarthritis of knee, unspecified: Secondary | ICD-10-CM | POA: Diagnosis present

## 2018-08-22 DIAGNOSIS — I639 Cerebral infarction, unspecified: Principal | ICD-10-CM

## 2018-08-22 DIAGNOSIS — R4702 Dysphasia: Secondary | ICD-10-CM | POA: Diagnosis not present

## 2018-08-22 DIAGNOSIS — N3000 Acute cystitis without hematuria: Secondary | ICD-10-CM

## 2018-08-22 DIAGNOSIS — R4781 Slurred speech: Secondary | ICD-10-CM | POA: Diagnosis present

## 2018-08-22 DIAGNOSIS — Z79899 Other long term (current) drug therapy: Secondary | ICD-10-CM | POA: Diagnosis not present

## 2018-08-22 DIAGNOSIS — K219 Gastro-esophageal reflux disease without esophagitis: Secondary | ICD-10-CM | POA: Diagnosis not present

## 2018-08-22 DIAGNOSIS — Z8673 Personal history of transient ischemic attack (TIA), and cerebral infarction without residual deficits: Secondary | ICD-10-CM | POA: Diagnosis not present

## 2018-08-22 DIAGNOSIS — R0902 Hypoxemia: Secondary | ICD-10-CM | POA: Diagnosis not present

## 2018-08-22 DIAGNOSIS — R4182 Altered mental status, unspecified: Secondary | ICD-10-CM | POA: Diagnosis not present

## 2018-08-22 DIAGNOSIS — M1710 Unilateral primary osteoarthritis, unspecified knee: Secondary | ICD-10-CM | POA: Diagnosis present

## 2018-08-22 DIAGNOSIS — R42 Dizziness and giddiness: Secondary | ICD-10-CM | POA: Diagnosis not present

## 2018-08-22 LAB — URINALYSIS, ROUTINE W REFLEX MICROSCOPIC
Bilirubin Urine: NEGATIVE
Glucose, UA: NEGATIVE mg/dL
HGB URINE DIPSTICK: NEGATIVE
KETONES UR: NEGATIVE mg/dL
NITRITE: POSITIVE — AB
PROTEIN: NEGATIVE mg/dL
SPECIFIC GRAVITY, URINE: 1.003 — AB (ref 1.005–1.030)
pH: 7 (ref 5.0–8.0)

## 2018-08-22 LAB — I-STAT TROPONIN, ED: TROPONIN I, POC: 0.01 ng/mL (ref 0.00–0.08)

## 2018-08-22 LAB — CBC
HCT: 36.3 % (ref 36.0–46.0)
Hemoglobin: 12.1 g/dL (ref 12.0–15.0)
MCH: 31.2 pg (ref 26.0–34.0)
MCHC: 33.3 g/dL (ref 30.0–36.0)
MCV: 93.6 fL (ref 80.0–100.0)
Platelets: 181 10*3/uL (ref 150–400)
RBC: 3.88 MIL/uL (ref 3.87–5.11)
RDW: 12.4 % (ref 11.5–15.5)
WBC: 6 10*3/uL (ref 4.0–10.5)
nRBC: 0 % (ref 0.0–0.2)

## 2018-08-22 LAB — COMPREHENSIVE METABOLIC PANEL
ALT: 16 U/L (ref 0–44)
ANION GAP: 7 (ref 5–15)
AST: 25 U/L (ref 15–41)
Albumin: 3.8 g/dL (ref 3.5–5.0)
Alkaline Phosphatase: 81 U/L (ref 38–126)
BUN: 11 mg/dL (ref 8–23)
CHLORIDE: 100 mmol/L (ref 98–111)
CO2: 27 mmol/L (ref 22–32)
Calcium: 9 mg/dL (ref 8.9–10.3)
Creatinine, Ser: 0.66 mg/dL (ref 0.44–1.00)
Glucose, Bld: 117 mg/dL — ABNORMAL HIGH (ref 70–99)
Potassium: 4 mmol/L (ref 3.5–5.1)
SODIUM: 134 mmol/L — AB (ref 135–145)
Total Bilirubin: 0.7 mg/dL (ref 0.3–1.2)
Total Protein: 6.5 g/dL (ref 6.5–8.1)

## 2018-08-22 LAB — DIFFERENTIAL
Abs Immature Granulocytes: 0.02 10*3/uL (ref 0.00–0.07)
BASOS ABS: 0 10*3/uL (ref 0.0–0.1)
BASOS PCT: 1 %
EOS PCT: 2 %
Eosinophils Absolute: 0.1 10*3/uL (ref 0.0–0.5)
Immature Granulocytes: 0 %
Lymphocytes Relative: 20 %
Lymphs Abs: 1.2 10*3/uL (ref 0.7–4.0)
MONO ABS: 0.7 10*3/uL (ref 0.1–1.0)
Monocytes Relative: 12 %
NEUTROS PCT: 65 %
Neutro Abs: 3.9 10*3/uL (ref 1.7–7.7)

## 2018-08-22 LAB — RAPID URINE DRUG SCREEN, HOSP PERFORMED
AMPHETAMINES: NOT DETECTED
Barbiturates: NOT DETECTED
Benzodiazepines: NOT DETECTED
Cocaine: NOT DETECTED
OPIATES: NOT DETECTED
Tetrahydrocannabinol: NOT DETECTED

## 2018-08-22 LAB — PROTIME-INR
INR: 1.08
PROTHROMBIN TIME: 13.9 s (ref 11.4–15.2)

## 2018-08-22 LAB — APTT: APTT: 30 s (ref 24–36)

## 2018-08-22 LAB — ETHANOL: Alcohol, Ethyl (B): <10 mg/dL (ref <10)

## 2018-08-22 MED ORDER — AMLODIPINE BESYLATE 5 MG PO TABS
5.0000 mg | ORAL_TABLET | Freq: Once | ORAL | Status: AC
  Administered 2018-08-22: 5 mg via ORAL
  Filled 2018-08-22: qty 1

## 2018-08-22 MED ORDER — FOSFOMYCIN TROMETHAMINE 3 G PO PACK
3.0000 g | PACK | Freq: Once | ORAL | Status: AC
  Administered 2018-08-22: 3 g via ORAL
  Filled 2018-08-22: qty 3

## 2018-08-22 NOTE — ED Triage Notes (Signed)
Ems called out for slurred speech and mouth droop. That has resolved. LKN 710PM  History of TIA  20 L AC.  143 CBG. SR on monitor. BP elevated >200

## 2018-08-22 NOTE — ED Provider Notes (Signed)
[facility] EMERGENCY DEPARTMENT Provider Note   CSN: [id number] Arrival date & time: 08/22/18  1959     History   Chief Complaint Chief Complaint  Patient presents with  . Aphasia    HPI Claire West is a 77 y.o. female.  HPI Patient presented to the emergency room for evaluation of speech difficulty.  Patient states she was out shopping.  She was with her caregiver.  She started to feel hot so they drove home.  That resolved but about 60 minutes later when she was home her son noticed that her speech was slurred.  This lasted for maybe 10 to 15 minutes.  Patient denied any other symptoms.  She was not having any new weakness.  No trouble with any numbness.  She did not have any visual difficulties.  She denies any trouble with any chest pain or shortness of breath.  All of her symptoms have now resolved.  Patient states she feels fine right now.  She does have history of prior TIAs. Past Medical History:  Diagnosis Date  . Depression   . Gait instability    walks with cane  . GERD (gastroesophageal reflux disease)   . Hyperlipemia   . Hypertension   . Knee pain   . Mild cognitive impairment with memory loss   . Seasonal allergies     Patient Active Problem List   Diagnosis Date Noted  . Delirium in remission 07/07/2018  . OA (osteoarthritis) of knee 05/05/2018  . Amnestic MCI (mild cognitive impairment with memory loss) 12/24/2017  . Acute metabolic encephalopathy 69/48/5462  . Altered mental status 11/06/2017  . Hyponatremia 02/12/2016  . Hypokalemia 02/12/2016  . Dehydration 02/12/2016  . Elevated LFTs 02/12/2016  . HTN (hypertension) 02/12/2016  . GERD (gastroesophageal reflux disease) 02/12/2016    Past Surgical History:  Procedure Laterality Date  . ABDOMINAL HYSTERECTOMY    . ARTHROSCOPIC REPAIR ACL    . COLONOSCOPY    . COLONOSCOPY N/A 11/23/2015   Procedure: COLONOSCOPY;  Surgeon: Rogene Houston, MD;  Location: [facility];  Service: Endoscopy;   Laterality: N/A;  830  . EYE SURGERY Bilateral    Cataracts  . TOTAL KNEE ARTHROPLASTY Left 05/05/2018   Procedure: LEFT TOTAL KNEE ARTHROPLASTY;  Surgeon: Gaynelle Arabian, MD;  Location: [facility];  Service: Orthopedics;  Laterality: Left;  with block     OB History    Gravida  1   Para  1   Term  1   Preterm      AB      Living  1     SAB      TAB      Ectopic      Multiple      Live Births               Home Medications    Prior to Admission medications   Medication Sig Start Date End Date Taking? Authorizing Provider  cholecalciferol (VITAMIN D) 1000 units tablet Take 1,000 Units by mouth daily at 12 noon.    [provider]  escitalopram (LEXAPRO) 10 MG tablet Take 10 mg by mouth at bedtime. 03/07/18   [provider]  fexofenadine (ALLEGRA) 180 MG tablet Take 180 mg by mouth daily.    [provider]  Javier Docker Oil 500 MG CAPS Take 500 mg by mouth daily at 12 noon.    [provider]  methocarbamol (ROBAXIN) 500 MG tablet Take 1 tablet (500 mg  total) by mouth every 6 (six) hours as needed for muscle spasms. 05/09/18   Edmisten, Kristie L, PA  metoprolol succinate (TOPROL-XL) 100 MG 24 hr tablet Take 100 mg by mouth 2 (two) times daily. Take with or immediately following a meal.    [provider]  Multiple Vitamin (MULTIVITAMIN WITH MINERALS) TABS tablet Take 1 tablet by mouth daily. Women's Multivitamin    [provider]  nitrofurantoin, macrocrystal-monohydrate, (MACROBID) 100 MG capsule Take 1 capsule (100 mg total) by mouth 2 (two) times daily. 07/23/18   Dorie Rank, MD  omeprazole (PRILOSEC) 20 MG capsule Take 20 mg by mouth daily before breakfast.  04/18/15   [provider]  potassium chloride (K-DUR) 10 MEQ tablet Take 1 tablet (10 mEq total) by mouth daily. Patient taking differently: Take 10 mEq by mouth daily at 12 noon.  11/04/17   Noemi Chapel, MD  Probiotic Product (PROBIOTIC PO) Take 1 capsule  by mouth daily. 40 Billion    [provider]  valsartan (DIOVAN) 320 MG tablet Take 320 mg by mouth daily at 3 pm.  12/18/17   [provider]    Family History Family History  Problem Relation Age of Onset  . Lung cancer Mother   . Lung cancer Sister     Social History Social History   Tobacco Use  . Smoking status: Never Smoker  . Smokeless tobacco: Never Used  Substance Use Topics  . Alcohol use: No  . Drug use: No     Allergies   Codeine; Neosporin [neomycin-bacitracin zn-polymyx]; Penicillins; and Sulfa antibiotics   Review of Systems Review of Systems  All other systems reviewed and are negative.    Physical Exam Updated Vital Signs BP (!) 182/90   Pulse 76   Temp 98.3 F (36.8 C) (Oral)   Resp 15   Ht 1.676 m (5\' 6" )   Wt 58.5 kg   SpO2 96%   BMI 20.82 kg/m   Physical Exam  Constitutional: She is oriented to person, place, and time. She appears well-developed and well-nourished. No distress.  HENT:  Head: Normocephalic and atraumatic.  Right Ear: External ear normal.  Left Ear: External ear normal.  Mouth/Throat: Oropharynx is clear and moist.  Eyes: Conjunctivae are normal. Right eye exhibits no discharge. Left eye exhibits no discharge. No scleral icterus.  Neck: Neck supple. No tracheal deviation present.  Cardiovascular: Normal rate, regular rhythm and intact distal pulses.  Pulmonary/Chest: Effort normal and breath sounds normal. No stridor. No respiratory distress. She has no wheezes. She has no rales.  Abdominal: Soft. Bowel sounds are normal. She exhibits no distension. There is no tenderness. There is no rebound and no guarding.  Musculoskeletal: She exhibits no edema or tenderness.  Neurological: She is alert and oriented to person, place, and time. She has normal strength. No cranial nerve deficit (No facial droop, extraocular movements intact, tongue midline ) or sensory deficit. She exhibits normal muscle tone. She  displays no seizure activity. Coordination normal.  No pronator drift bilateral upper extrem, able to hold both legs off bed for 5 seconds, sensation intact in all extremities, no visual field cuts, no left or right sided neglect, normal finger-nose exam bilaterally, no nystagmus noted   Skin: Skin is warm and dry. No rash noted.  Psychiatric: She has a normal mood and affect.  Nursing note and vitals reviewed.    ED Treatments / Results  Labs (all labs ordered are listed, but only abnormal results are displayed)  Labs Reviewed  COMPREHENSIVE METABOLIC PANEL - Abnormal; Notable for the following components:      Result Value   Sodium 134 (*)    Glucose, Bld 117 (*)    All other components within normal limits  URINALYSIS, ROUTINE W REFLEX MICROSCOPIC - Abnormal; Notable for the following components:   APPearance HAZY (*)    Specific Gravity, Urine 1.003 (*)    Nitrite POSITIVE (*)    Leukocytes, UA LARGE (*)    WBC, UA >50 (*)    Bacteria, UA RARE (*)    All other components within normal limits  URINE CULTURE  ETHANOL  PROTIME-INR  APTT  CBC  DIFFERENTIAL  RAPID URINE DRUG SCREEN, HOSP PERFORMED  I-STAT TROPONIN, ED    EKG EKG Interpretation  Date/Time:  Friday August 22 2018 20:24:58 EST Ventricular Rate:  78 PR Interval:    QRS Duration: 109 QT Interval:  417 QTC Calculation: 475 R Axis:   -2 Text Interpretation:  probable sinus rhythm Probable left ventricular hypertrophy Anterior Q waves, possibly due to LVH Artifact in lead(s) I III aVR aVL aVF V1  Poor data quality, interpretation may be adversely affected Confirmed by Dorie Rank [phone]) on 08/22/2018 8:41:29 PM   Radiology Ct Head Wo Contrast  Result Date: 08/22/2018 CLINICAL DATA:  Altered mental status today EXAM: CT HEAD WITHOUT CONTRAST TECHNIQUE: Contiguous axial images were obtained from the base of the skull through the vertex without intravenous contrast. COMPARISON:  07/23/2018 FINDINGS: Brain:  Chronic stable small vessel ischemic disease of periventricular white matter. Stable or focal right frontal white matter hypodensity/encephalomalacia. Age related involutional changes of the brain are demonstrated. No acute intracranial hemorrhage large vascular territory infarct, hydrocephalus, midline shift or edema. Midline fourth ventricle and basal cisterns without effacement. Small chronic caudate lacunar infarcts are noted bilaterally. Vascular: No hyperdense vessel sign. No unexpected calcifications. Atherosclerosis of the carotid siphons and both vertebral arteries. Skull: Intact Sinuses/Orbits: Nonacute Other: None IMPRESSION: Chronic small vessel ischemic disease of periventricular white matter. No acute intracranial abnormality. Electronically Signed   By: Ashley Royalty M.D.   On: 08/22/2018 20:55    Procedures Procedures (including critical care time)  Medications Ordered in ED Medications  amLODipine (NORVASC) tablet 5 mg (has no administration in time range)  fosfomycin (MONUROL) packet 3 g (has no administration in time range)     Initial Impression / Assessment and Plan / ED Course  I have reviewed the triage vital signs and the nursing notes.  Pertinent labs & imaging results that were available during my care of the patient were reviewed by me and considered in my medical decision making (see chart for details).   Pt presented with tia sx consisting of aphasia.  Sx all resolved at this point.  Labs also notable for probable uti and BP remains elevated.  No deficits at this time.  Will admit for further tia workup and monitoring.   Final Clinical Impressions(s) / ED Diagnoses   Final diagnoses:  TIA (transient ischemic attack)  Acute cystitis without hematuria  Hypertension, unspecified type      Dorie Rank, MD 08/22/18 2205

## 2018-08-22 NOTE — ED Notes (Signed)
Pt was informed that we need a urine sample. Pt states that she can not urinate at this time. 

## 2018-08-23 ENCOUNTER — Observation Stay (HOSPITAL_BASED_OUTPATIENT_CLINIC_OR_DEPARTMENT_OTHER): Payer: Medicare Other

## 2018-08-23 ENCOUNTER — Observation Stay (HOSPITAL_COMMUNITY): Payer: Medicare Other

## 2018-08-23 DIAGNOSIS — K219 Gastro-esophageal reflux disease without esophagitis: Secondary | ICD-10-CM | POA: Diagnosis not present

## 2018-08-23 DIAGNOSIS — I10 Essential (primary) hypertension: Secondary | ICD-10-CM

## 2018-08-23 DIAGNOSIS — I340 Nonrheumatic mitral (valve) insufficiency: Secondary | ICD-10-CM | POA: Diagnosis not present

## 2018-08-23 DIAGNOSIS — N3000 Acute cystitis without hematuria: Secondary | ICD-10-CM

## 2018-08-23 DIAGNOSIS — Z8673 Personal history of transient ischemic attack (TIA), and cerebral infarction without residual deficits: Secondary | ICD-10-CM | POA: Diagnosis not present

## 2018-08-23 DIAGNOSIS — I351 Nonrheumatic aortic (valve) insufficiency: Secondary | ICD-10-CM

## 2018-08-23 DIAGNOSIS — G459 Transient cerebral ischemic attack, unspecified: Secondary | ICD-10-CM

## 2018-08-23 DIAGNOSIS — Z79899 Other long term (current) drug therapy: Secondary | ICD-10-CM | POA: Diagnosis not present

## 2018-08-23 DIAGNOSIS — Z96652 Presence of left artificial knee joint: Secondary | ICD-10-CM | POA: Diagnosis not present

## 2018-08-23 DIAGNOSIS — R4781 Slurred speech: Secondary | ICD-10-CM | POA: Diagnosis not present

## 2018-08-23 DIAGNOSIS — I639 Cerebral infarction, unspecified: Secondary | ICD-10-CM | POA: Diagnosis not present

## 2018-08-23 DIAGNOSIS — M179 Osteoarthritis of knee, unspecified: Secondary | ICD-10-CM | POA: Diagnosis not present

## 2018-08-23 LAB — CREATININE, SERUM
Creatinine, Ser: 0.69 mg/dL (ref 0.44–1.00)
GFR calc Af Amer: >60 mL/min (ref >60)

## 2018-08-23 LAB — CBC
HEMATOCRIT: 36 % (ref 36.0–46.0)
Hemoglobin: 12.4 g/dL (ref 12.0–15.0)
MCH: 30.8 pg (ref 26.0–34.0)
MCHC: 34.4 g/dL (ref 30.0–36.0)
MCV: 89.6 fL (ref 80.0–100.0)
NRBC: 0 % (ref 0.0–0.2)
PLATELETS: 188 10*3/uL (ref 150–400)
RBC: 4.02 MIL/uL (ref 3.87–5.11)
RDW: 12.4 % (ref 11.5–15.5)
WBC: 8.5 10*3/uL (ref 4.0–10.5)

## 2018-08-23 LAB — LIPID PANEL
CHOL/HDL RATIO: 5.3 ratio
Cholesterol: 186 mg/dL (ref 0–200)
HDL: 35 mg/dL — AB (ref >40)
LDL CALC: 117 mg/dL — AB (ref 0–99)
TRIGLYCERIDES: 170 mg/dL — AB (ref <150)
VLDL: 34 mg/dL (ref 0–40)

## 2018-08-23 LAB — ECHOCARDIOGRAM COMPLETE
HEIGHTINCHES: 66 in
WEIGHTICAEL: 2064 [oz_av]

## 2018-08-23 LAB — HEMOGLOBIN A1C
Hgb A1c MFr Bld: 5 % (ref 4.8–5.6)
MEAN PLASMA GLUCOSE: 96.8 mg/dL

## 2018-08-23 MED ORDER — CLOPIDOGREL BISULFATE 75 MG PO TABS
75.0000 mg | ORAL_TABLET | Freq: Every day | ORAL | Status: DC
  Administered 2018-08-23 – 2018-08-24 (×2): 75 mg via ORAL
  Filled 2018-08-23 (×2): qty 1

## 2018-08-23 MED ORDER — ACETAMINOPHEN 325 MG PO TABS
650.0000 mg | ORAL_TABLET | ORAL | Status: DC | PRN
  Administered 2018-08-23: 650 mg via ORAL
  Filled 2018-08-23: qty 2

## 2018-08-23 MED ORDER — PANTOPRAZOLE SODIUM 40 MG PO TBEC
40.0000 mg | DELAYED_RELEASE_TABLET | Freq: Every day | ORAL | Status: DC
  Administered 2018-08-23 – 2018-08-24 (×2): 40 mg via ORAL
  Filled 2018-08-23 (×2): qty 1

## 2018-08-23 MED ORDER — ASPIRIN 300 MG RE SUPP: 300.0000 mg | Freq: Every day | RECTAL | Status: DC

## 2018-08-23 MED ORDER — ACETAMINOPHEN 160 MG/5ML PO SOLN: 650.0000 mg | ORAL | Status: DC | PRN

## 2018-08-23 MED ORDER — HYDRALAZINE HCL 20 MG/ML IJ SOLN
10.0000 mg | Freq: Once | INTRAMUSCULAR | Status: AC
  Administered 2018-08-23: 10 mg via INTRAVENOUS
  Filled 2018-08-23: qty 1

## 2018-08-23 MED ORDER — ASPIRIN 325 MG PO TABS
325.0000 mg | ORAL_TABLET | Freq: Every day | ORAL | Status: DC
  Administered 2018-08-23: 325 mg via ORAL
  Filled 2018-08-23: qty 1

## 2018-08-23 MED ORDER — SODIUM CHLORIDE 0.9 % IV SOLN
1.0000 g | INTRAVENOUS | Status: DC
  Administered 2018-08-24: 1 g via INTRAVENOUS
  Filled 2018-08-23: qty 10

## 2018-08-23 MED ORDER — SENNOSIDES-DOCUSATE SODIUM 8.6-50 MG PO TABS: 1.0000 | ORAL_TABLET | Freq: Every evening | ORAL | Status: DC | PRN

## 2018-08-23 MED ORDER — METOPROLOL SUCCINATE ER 100 MG PO TB24
100.0000 mg | ORAL_TABLET | Freq: Two times a day (BID) | ORAL | Status: DC
  Administered 2018-08-23 – 2018-08-24 (×4): 100 mg via ORAL
  Filled 2018-08-23 (×4): qty 1

## 2018-08-23 MED ORDER — IRBESARTAN 300 MG PO TABS
300.0000 mg | ORAL_TABLET | Freq: Every day | ORAL | Status: DC
  Administered 2018-08-23 – 2018-08-24 (×2): 300 mg via ORAL
  Filled 2018-08-23 (×2): qty 1

## 2018-08-23 MED ORDER — ACETAMINOPHEN 650 MG RE SUPP: 650.0000 mg | RECTAL | Status: DC | PRN

## 2018-08-23 MED ORDER — STROKE: EARLY STAGES OF RECOVERY BOOK
Freq: Once | Status: AC
  Administered 2018-08-23: 04:00:00
  Filled 2018-08-23: qty 1

## 2018-08-23 MED ORDER — ENOXAPARIN SODIUM 40 MG/0.4ML ~~LOC~~ SOLN
40.0000 mg | SUBCUTANEOUS | Status: DC
  Administered 2018-08-23: 40 mg via SUBCUTANEOUS
  Filled 2018-08-23 (×2): qty 0.4

## 2018-08-23 MED ORDER — SODIUM CHLORIDE 0.9 % IV SOLN: INTRAVENOUS | Status: DC

## 2018-08-23 MED ORDER — ESCITALOPRAM OXALATE 10 MG PO TABS
10.0000 mg | ORAL_TABLET | Freq: Every day | ORAL | Status: DC
  Administered 2018-08-23: 10 mg via ORAL
  Filled 2018-08-23: qty 1

## 2018-08-23 NOTE — Evaluation (Signed)
Occupational Therapy Evaluation Patient Details Name: Claire West MRN: [id number] DOB: 10-04-1941 Today's Date: 08/23/2018    History of Present Illness Pt is a 77 y.o. female, with history of hypertension, GERD, hyperlipidemia came to the ED with chief complaint of speech difficulty.   MRI reveals: New subacute versus chronic bifrontal white matter and RIGHT parieto-occipital junction infarcts.   Clinical Impression   PTA patient has 24/7 care, able to complete ADLs with assistance and mobility using RW.  Patient currently admitted for above and limited by problem list below.  Patient completes UB ADLs with min assist, LB ADLs with mod to max assist, toileting with max assist and toilet transfers with min assist.  Patients sister reports cognition and mobility is near baseline, but limited eval completed due to transport arriving for testing.   Believe patient will benefit from continued OT services while admitted in order to optimize independence and return to PLOF, but patient has 24/7 care at dc and do not anticipate further OT needs.  Will continue to follow.     Follow Up Recommendations  Supervision/Assistance - 24 hour    Equipment Recommendations  None recommended by OT    Recommendations for Other Services PT consult     Precautions / Restrictions Precautions Precautions: Fall Restrictions Weight Bearing Restrictions: No      Mobility Bed Mobility Overal bed mobility: Needs Assistance Bed Mobility: Supine to Sit;Sit to Supine     Supine to sit: Min assist Sit to supine: Min guard   General bed mobility comments: min assist for trunk support to transition to sitting EOB, increased time and effort  Transfers Overall transfer level: Needs assistance Equipment used: 1 person hand held assist Transfers: Sit to/from Stand Sit to Stand: Min assist         General transfer comment: min assist for safety and balance, increased time for initation of task      Balance Overall balance assessment: Needs assistance Sitting-balance support: No upper extremity supported;Feet supported Sitting balance-Leahy Scale: Fair     Standing balance support: During functional activity;Single extremity supported Standing balance-Leahy Scale: Poor Standing balance comment: reliant on UE and external support                           ADL either performed or assessed with clinical judgement   ADL Overall ADL's : Needs assistance/impaired     Grooming: Sitting;Minimal assistance   Upper Body Bathing: Minimal assistance;Sitting   Lower Body Bathing: Moderate assistance;Sit to/from stand   Upper Body Dressing : Minimal assistance;Sitting   Lower Body Dressing: Sit to/from stand;Maximal assistance   Toilet Transfer: Minimal assistance;Ambulation;BSC   Toileting- Clothing Manipulation and Hygiene: Maximal assistance;Sit to/from stand Toileting - Clothing Manipulation Details (indicate cue type and reason): assist for clothing mgmt      Functional mobility during ADLs: Minimal assistance General ADL Comments: patient requires increased time for all activities     Vision Baseline Vision/History: Wears glasses Wears Glasses: At all times Patient Visual Report: No change from baseline Additional Comments: unable to assess, transport arrived for testing     Perception     Praxis      Pertinent Vitals/Pain Pain Assessment: No/denies pain     Hand Dominance Right   Extremity/Trunk Assessment Upper Extremity Assessment Upper Extremity Assessment: Overall WFL for tasks assessed   Lower Extremity Assessment Lower Extremity Assessment: Defer to PT evaluation       Communication Communication Communication:  No difficulties   Cognition Arousal/Alertness: Lethargic Behavior During Therapy: WFL for tasks assessed/performed Overall Cognitive Status: History of cognitive impairments - at baseline Area of Impairment:  Orientation;Memory;Attention;Following commands;Safety/judgement;Awareness;Problem solving                 Orientation Level: Disoriented to;Situation Current Attention Level: Sustained Memory: Decreased short-term memory Following Commands: Follows one step commands with increased time;Follows one step commands consistently Safety/Judgement: Decreased awareness of deficits;Decreased awareness of safety Awareness: Emergent Problem Solving: Slow processing;Decreased initiation;Requires verbal cues;Requires tactile cues;Difficulty sequencing General Comments: Her sister reports cognition is at baseline.   General Comments  sister present and supportive    Exercises     Shoulder Instructions      Home Living Family/patient expects to be discharged to:: Private residence Living Arrangements: Alone Available Help at Discharge: Available 24 hours/day;Family(caregivers) Type of Home: House Home Access: Stairs to enter [employer] of Steps: 3 Entrance Stairs-Rails: Right;Left;Can reach both [place]: Two level;Able to live on main level with bedroom/bathroom Alternate Level Stairs-Number of Steps: 12 (has chair lift to to second level), sleeps on main floor with 2-3 steps down to bathroom   Bathroom Shower/Tub: Walk-in shower(upstairs )   Biochemist, clinical: Standard     Home Equipment: Cane - single point;Walker - 2 wheels;Shower seat;Bedside commode;Wheelchair - manual;Hand held shower head          Prior Functioning/Environment Level of Independence: Needs assistance  Gait / Transfers Assistance Needed: used RW for mobility, min assist at times for low surface transfers otherwise supervision ADL's / Homemaking Assistance Needed: needs assist for bathing and dressing tasks   Comments: has 24/7 care        OT Problem List: Decreased activity tolerance;Impaired balance (sitting and/or standing);Decreased cognition;Decreased safety awareness;Decreased  knowledge of use of DME or AE      OT Treatment/Interventions: Self-care/ADL training;Therapeutic exercise;DME and/or AE instruction;Therapeutic activities;Balance training;Patient/family education    OT Goals(Current goals can be found in the care plan section) Acute Rehab OT Goals Patient Stated Goal: to go home OT Goal Formulation: With patient Time For Goal Achievement: 09/06/18 Potential to Achieve Goals: Good  OT Frequency: Min 2X/week   Barriers to D/C:            Co-evaluation              AM-PAC PT "6 Clicks" Daily Activity     Outcome Measure Help from another person eating meals?: None Help from another person taking care of personal grooming?: A Little Help from another person toileting, which includes using toliet, bedpan, or urinal?: A Lot Help from another person bathing (including washing, rinsing, drying)?: A Little Help from another person to put on and taking off regular upper body clothing?: A Little Help from another person to put on and taking off regular lower body clothing?: A Lot 6 Click Score: 17   End of Session Nurse Communication: Mobility status  Activity Tolerance: Patient tolerated treatment well Patient left: in bed;with call bell/phone within reach;with family/visitor present(transport arrived)  OT Visit Diagnosis: Unsteadiness on feet (R26.81);Other abnormalities of gait and mobility (R26.89)                Time: 8127-5170 OT Time Calculation (min): 22 min Charges:  OT General Charges $OT Visit: 1 Visit OT Evaluation $OT Eval Moderate Complexity: [place], OT Acute Rehabilitation Services Pager [phone] Office [phone]   Delight Stare 08/23/2018, 9:38 AM

## 2018-08-23 NOTE — Evaluation (Signed)
Physical Therapy Evaluation Patient Details Name: Claire West MRN: [id number] DOB: 1940-11-30 Today's Date: 08/23/2018   History of Present Illness  Pt is a 77 y.o. female with a past medical history consisting of HTN, hyperlipidemia, L TKA on 04/2018 and prior TIA's came to the ED with chief complaint of speech difficulty and blurry vision. MRI on 08/23/2018 reveals: New subacute versus chronic bifrontal white matter and RIGHT parieto-occipital junction infarcts and no acute infarcts.  Clinical Impression  Pt presents supine, HOB elevated, and alert. Pt states willingness to participate in PT. Pt sister is present throughout the session. Prior to admission, pt was modified independent with mobility, requiring the use of a RW. Pt also had 24/7 care at home for assistance with ADL's. Pt lives in a two story home with 3 steps to enter, but can live on the first floor if needed. Pt is overall supervision with sitting balance and supine to sit, while mod A with sit to supine. Pt is also min guard with stairs and ambulation, while min A for transfers. Pt required increased time during walking, with no reported fatigue or dizziness throughout. Pt also denies any current symptoms of blurry vision. Currently recommending outpatient PT due to 24/7 assistance at home and to improve functional balance and strength. Pt would benefit from continued PT in order to increase strength, balance and functional mobility.         Follow Up Recommendations Outpatient PT    Equipment Recommendations  None recommended by PT    Recommendations for Other Services       Precautions / Restrictions Precautions Precautions: Fall Restrictions Weight Bearing Restrictions: No      Mobility  Bed Mobility Overal bed mobility: Needs Assistance Bed Mobility: Rolling;Sit to Supine;Supine to Sit Rolling: Supervision   Supine to sit: Supervision Sit to supine: Mod assist   General bed mobility comments: Supervision  during rolling and supine to sit for safety. Increased time and effort needed. BLE assist during sit to spine with pt able to control trunk.   Transfers Overall transfer level: Needs assistance Equipment used: Rolling walker (2 wheeled) Transfers: Sit to/from Stand Sit to Stand: Min assist         General transfer comment: Minimal assist given for initial power up and stability.   Ambulation/Gait Ambulation/Gait assistance: Min guard Gait Distance (Feet): 150 Feet Assistive device: Rolling walker (2 wheeled) Gait Pattern/deviations: Decreased step length - right;Decreased step length - left   Gait velocity interpretation: 1.31 - 2.62 ft/sec, indicative of limited community ambulator General Gait Details: Pt demonstrates decreased gait speed, which is reported to be at baseline. Pt requires verbal cueing to maintain positioning inside the walker frame. VC given for forward gaze stabilization and upright posture.    Stairs Stairs: Yes Stairs assistance: Min guard Stair Management: Two rails;Step to pattern Number of Stairs: 4(2x2) General stair comments: Pt required increased time in effort in order to ambulate stairs. Pt demonstrates mild decrease in stability with no loss of balance.   Wheelchair Mobility    Modified Rankin (Stroke Patients Only) Modified Rankin (Stroke Patients Only) Pre-Morbid Rankin Score: Moderate disability Modified Rankin: Moderate disability     Balance Overall balance assessment: Needs assistance Sitting-balance support: No upper extremity supported;Feet supported Sitting balance-Leahy Scale: Fair Sitting balance - Comments: Pt able to maintain balance with supervision for safety.    Standing balance support: Bilateral upper extremity supported;During functional activity Standing balance-Leahy Scale: Poor Standing balance comment: Pt requires BUE support in  order to maintain standing balance.                              Pertinent  Vitals/Pain Pain Assessment: No/denies pain    Home Living Family/patient expects to be discharged to:: Private residence Living Arrangements: Alone Available Help at Discharge: Personal care attendant;Available 24 hours/day;Family Type of Home: House Home Access: Stairs to enter Entrance Stairs-Rails: Can reach both Entrance Stairs-Number of Steps: 3 Home Layout: Two level;Able to live on main level with bedroom/bathroom(hospital bed ) Home Equipment: Cane - single point;Walker - 2 wheels;Shower seat;Bedside commode;Wheelchair - manual;Hand held shower head      Prior Function Level of Independence: Needs assistance   Gait / Transfers Assistance Needed: RW for ambulation both inside and outside the home   ADL's / Homemaking Assistance Needed: needs assist for bathing and dressing tasks  Comments: 24/7 support from caregiver      Hand Dominance   Dominant Hand: Right    Extremity/Trunk Assessment   Upper Extremity Assessment Upper Extremity Assessment: Overall WFL for tasks assessed    Lower Extremity Assessment Lower Extremity Assessment: Overall WFL for tasks assessed       Communication   Communication: No difficulties  Cognition Arousal/Alertness: Awake/alert Behavior During Therapy: WFL for tasks assessed/performed Overall Cognitive Status: History of cognitive impairments - at baseline Area of Impairment: Following commands;Problem solving                       Following Commands: Follows one step commands with increased time;Follows one step commands consistently     Problem Solving: Slow processing;Requires verbal cues        General Comments General comments (skin integrity, edema, etc.): sister present    Exercises     Assessment/Plan    PT Assessment Patient needs continued PT services  PT Problem List Decreased strength;Decreased range of motion;Decreased activity tolerance;Decreased balance;Decreased mobility;Decreased  coordination;Decreased knowledge of use of DME       PT Treatment Interventions DME instruction;Gait training;Stair training;Functional mobility training;Therapeutic exercise;Therapeutic activities;Balance training;Neuromuscular re-education    PT Goals (Current goals can be found in the Care Plan section)  Acute Rehab PT Goals Patient Stated Goal: to go home PT Goal Formulation: With patient Time For Goal Achievement: 09/06/18 Potential to Achieve Goals: Good    Frequency Min 4X/week   Barriers to discharge        Co-evaluation               AM-PAC PT "6 Clicks" Daily Activity  Outcome Measure Difficulty turning over in bed (including adjusting bedclothes, sheets and blankets)?: A Little Difficulty moving from lying on back to sitting on the side of the bed? : A Lot Difficulty sitting down on and standing up from a chair with arms (e.g., wheelchair, bedside commode, etc,.)?: Unable Help needed moving to and from a bed to chair (including a wheelchair)?: A Little Help needed walking in hospital room?: A Little Help needed climbing 3-5 steps with a railing? : A Little 6 Click Score: 15    End of Session Equipment Utilized During Treatment: Gait belt Activity Tolerance: Patient tolerated treatment well Patient left: in bed;with call bell/phone within reach;with bed alarm set;with family/visitor present Nurse Communication: Mobility status PT Visit Diagnosis: Other abnormalities of gait and mobility (R26.89);Muscle weakness (generalized) (M62.81)    Time: 2683-4196 PT Time Calculation (min) (ACUTE ONLY): 29 min   Charges:  PT Evaluation $PT Eval Moderate Complexity: (P) 1 Mod PT Treatments $Gait Training: (P) 8-22 mins        Wandra Feinstein, SPT Acute Rehab [phone] (pager) [phone] (office)   Arlicia Paquette 08/23/2018, 1:58 PM

## 2018-08-23 NOTE — Consult Note (Addendum)
NEURO HOSPITALIST  CONSULT     Requesting Physician: Dr. Reesa Chew    Chief Complaint: slurred speech  History obtained from:  Patient  / daughter    HPI:                                                                                                                                         Claire West is an 77 y.o. female with PMH significant for HTN, HLD who presented to [place] 08/22/18 with c/o speech difficulty. Transferred to [facility] for MRI/MRA. neurology consulted for stroke seen on MRI.   patient states that she was out shopping at [employer] with her LPN ( caretaker). She started to feel hot, had to sit down and had some nausea/vomiting and double vision. so they drove home. About an hour later she was home when they noticed slurred speech and a drooping face. They took her to [employer]. The slurred speech lasted about 10-15 minutes.  Denies any CP, SOB, vision changes, numbness or weakness. Symptoms resolved. No prior stroke history. Does have a history of TIA's.  Hospital course: 08/22/18: admitted to obs at [facility] for TIA CT Head: no hemorrhage. BG: 117 BP: 214/97 08/23/18: transferred to [facility] for MRI/MRA MRI: New subacute versus chronic bifrontal white matter and RIGHT parieto-occipital junction Infarcts.old lacunar infarcts   Date last known well: Date: 08/23/2018 Time last known well: Time: 19:00 tPA Given: No: outside of window  Modified Rankin: Rankin Score=1 NIHSS:0   Past Medical History:  Diagnosis Date  . Depression   . Gait instability    walks with cane  . GERD (gastroesophageal reflux disease)   . Hyperlipemia   . Hypertension   . Knee pain   . Mild cognitive impairment with memory loss   . Seasonal allergies     Past Surgical History:  Procedure Laterality Date  . ABDOMINAL HYSTERECTOMY    . ARTHROSCOPIC REPAIR ACL    . COLONOSCOPY    . COLONOSCOPY N/A 11/23/2015   Procedure: COLONOSCOPY;  Surgeon:  Rogene Houston, MD;  Location: [facility] SUITE;  Service: Endoscopy;  Laterality: N/A;  830  . EYE SURGERY Bilateral    Cataracts  . TOTAL KNEE ARTHROPLASTY Left 05/05/2018   Procedure: LEFT TOTAL KNEE ARTHROPLASTY;  Surgeon: Gaynelle Arabian, MD;  Location: [facility] ORS;  Service: Orthopedics;  Laterality: Left;  with block    Family History  Problem Relation Age of Onset  . Lung cancer Mother   . Lung cancer Sister          Social History:  reports that she has never smoked. She has  never used smokeless tobacco. She reports that she does not drink alcohol or use drugs.  Allergies:  Allergies  Allergen Reactions  . Adhesive [Tape] Itching and Rash    Irritation at site  . Codeine Hives and Rash  . Neosporin [Neomycin-Bacitracin Zn-Polymyx] Rash  . Penicillins Rash    Oral rash/peeling Has patient had a PCN reaction causing immediate rash, facial/tongue/throat swelling, SOB or lightheadedness with hypotension: Yes Has patient had a PCN reaction causing severe rash involving mucus membranes or skin necrosis: No Has patient had a PCN reaction that required hospitalization No Has patient had a PCN reaction occurring within the last 10 years: No If all of the above answers are "NO", then may proceed with Cephalosporin use.   . Sulfa Antibiotics Hives and Rash    Medications:                                                                                                                           Scheduled: . aspirin  300 mg Rectal Daily   Or  . aspirin  325 mg Oral Daily  . enoxaparin (LOVENOX) injection  40 mg Subcutaneous Q24H  . escitalopram  10 mg Oral QHS  . irbesartan  300 mg Oral Daily  . metoprolol succinate  100 mg Oral BID  . pantoprazole  40 mg Oral Daily   Continuous: . sodium chloride    . [START ON 08/24/2018] cefTRIAXone (ROCEPHIN)  IV     VXB:[id number] **OR** acetaminophen (TYLENOL) oral liquid 160 mg/5 mL **OR** acetaminophen, senna-docusate   ROS:                                                                                                                                        ROS was performed and is negative except as noted in HPI    General Examination:  Blood pressure (!) 179/84, pulse 78, temperature 98.4 F (36.9 C), temperature source Oral, resp. rate 13, height 5\' 6"  (1.676 m), weight 58.5 kg, SpO2 100 %.  HEENT-  Normocephalic, no lesions, without obvious abnormality.  Normal external eye and conjunctiva.  Cardiovascular- S1-S2 audible, pulses palpable throughout  Lungs-no rhonchi or wheezing noted, no excessive working breathing.  Saturations within normal limits on RA Abdomen- All 4 quadrants palpated and nontender Extremities- Warm, dry and intact Musculoskeletal-no joint tenderness, deformity or swelling Skin-warm and dry, healed incision over left knee Neurological Examination Mental Status: Alert, oriented, thought content appropriate.  Naming intact. No dysarthria noted. Speech fluent without evidence of aphasia.  Able to follow commands without difficulty. Cranial Nerves: II:  Visual fields grossly normal,  III,IV, VI: ptosis not present, extra-ocular motions intact bilaterally, 8mm brisk, pupils equal, round, reactive to light and accommodation V,VII: smile symmetric, facial light touch sensation normal bilaterally VIII: hearing normal bilaterally IX,X: uvula rises symmetrically XI: bilateral shoulder shrug XII: midline tongue extension Motor: Right : Upper extremity   5/5 Left:     Upper extremity   5/5  Lower extremity   5/5  Lower extremity   5/5 Tone and bulk:normal tone throughout; no atrophy noted Sensory: Pinprick and light touch intact throughout, bilaterally Deep Tendon Reflexes: 2+ and symmetric biceps and patella Plantars: Right: downgoing   Left: downgoing Cerebellar: normal finger-to-nose,  normal  heel-to-shin test Gait: deferred   Lab Results: Basic Metabolic Panel: Recent Labs  Lab 08/22/18 2032 08/23/18 0612  NA 134*  --   K 4.0  --   CL 100  --   CO2 27  --   GLUCOSE 117*  --   BUN 11  --   CREATININE 0.66 0.69  CALCIUM 9.0  --     CBC: Recent Labs  Lab 08/22/18 2032 08/23/18 0612  WBC 6.0 8.5  NEUTROABS 3.9  --   HGB 12.1 12.4  HCT 36.3 36.0  MCV 93.6 89.6  PLT 181 188    Lipid Panel: Recent Labs  Lab 08/23/18 0612  CHOL 186  TRIG 170*  HDL 35*  CHOLHDL 5.3  VLDL 34  LDLCALC 117*    CBG: No results for input(s): GLUCAP in the last 168 hours.  Imaging: Dg Chest 2 View  Result Date: 08/23/2018 CLINICAL DATA:  Slurred speech, mouth droop. EXAM: CHEST - 2 VIEW COMPARISON:  Chest radiograph December 25, 2017 FINDINGS: Cardiac silhouette is mildly enlarged. Mediastinal silhouette is normal. Calcified aortic arch. No pleural effusions or focal consolidations. Trachea projects midline and there is no pneumothorax. Soft tissue planes and included osseous structures are non-suspicious. Calcified bilateral breast implants. IMPRESSION: Mild cardiomegaly.  No acute pulmonary process. Electronically Signed   By: Elon Alas M.D.   On: 08/23/2018 01:14   Ct Head Wo Contrast  Result Date: 08/22/2018 CLINICAL DATA:  Altered mental status today EXAM: CT HEAD WITHOUT CONTRAST TECHNIQUE: Contiguous axial images were obtained from the base of the skull through the vertex without intravenous contrast. COMPARISON:  07/23/2018 FINDINGS: Brain: Chronic stable small vessel ischemic disease of periventricular white matter. Stable or focal right frontal white matter hypodensity/encephalomalacia. Age related involutional changes of the brain are demonstrated. No acute intracranial hemorrhage large vascular territory infarct, hydrocephalus, midline shift or edema. Midline fourth ventricle and basal cisterns without effacement. Small chronic caudate lacunar infarcts are noted  bilaterally. Vascular: No hyperdense vessel sign. No unexpected calcifications. Atherosclerosis of the carotid siphons and both vertebral arteries. Skull: Intact  Sinuses/Orbits: Nonacute Other: None IMPRESSION: Chronic small vessel ischemic disease of periventricular white matter. No acute intracranial abnormality. Electronically Signed   By: Ashley Royalty M.D.   On: 08/22/2018 20:55   Mr Brain Wo Contrast  Result Date: 08/23/2018 CLINICAL DATA:  Episodic slurred speech. History of hypertension and hyperlipidemia. EXAM: MRI HEAD WITHOUT CONTRAST MRA HEAD WITHOUT CONTRAST TECHNIQUE: Multiplanar, multiecho pulse sequences of the brain and surrounding structures were obtained without intravenous contrast. Angiographic images of the head were obtained using MRA technique without contrast. COMPARISON:  CT HEAD August 22, 2018 and MRI head November 06, 2017 FINDINGS: MRI HEAD FINDINGS INTRACRANIAL CONTENTS: Faint marginal reduced diffusion of cystic bifrontal white matter infarcts without ADC abnormality. Faint reduced diffusion RIGHT parietooccipital junction without ADC abnormality. Scattered chronic microhemorrhages. Confluent supratentorial and patchy pontine white matter FLAIR T2 hyperintensities. Hazy T2 hyperintensities bilateral basal ganglia and thalami associated with chronic hypertension, superimposed old lacunar infarcts. No advanced parenchymal brain volume loss. No hydrocephalus. No midline shift, mass effect or masses. No abnormal extra-axial fluid collections. VASCULAR: Normal major intracranial vascular flow voids present at skull base. SKULL AND UPPER CERVICAL SPINE: No abnormal sellar expansion. No suspicious calvarial bone marrow signal. Grade 1 C3-4 anterolisthesis. Pannus about the odontoid process seen with CPPD. Craniocervical junction maintained. SINUSES/ORBITS: The mastoid air-cells and included paranasal sinuses are well-aerated.The included ocular globes and orbital contents are  non-suspicious. OTHER: None. MRA HEAD FINDINGS ANTERIOR CIRCULATION: Normal flow related enhancement of the included cervical, petrous, cavernous and supraclinoid internal carotid arteries. Mild stenosis RIGHT supraclinoid ICA. Patent anterior communicating artery. Patent anterior and middle cerebral arteries, moderate luminal irregularity compatible with atherosclerosis. No large vessel occlusion, flow limiting stenosis, aneurysm. POSTERIOR CIRCULATION: Codominant vertebral arteries. Severe stenosis LEFT proximal V4 segment. Vertebrobasilar arteries are patent, with normal flow related enhancement of the main branch vessels. Patent posterior cerebral arteries. Severe stenosis LEFT P2 segment. No large vessel occlusion, flow limiting stenosis,  aneurysm. ANATOMIC VARIANTS: Hypoplastic RIGHT A1 segment. Source images and MIP images were reviewed. IMPRESSION: MRI HEAD: 1. No acute intracranial process. New subacute versus chronic bifrontal white matter and RIGHT parieto-occipital junction infarcts. 2. Severe chronic small vessel ischemic changes and old lacunar infarcts. MRA HEAD: 1. No emergent large vessel occlusion. 2. Severe stenosis LEFT V4 and LEFT P2 segments. 3. Moderate intracranial atherosclerosis. Electronically Signed   By: Elon Alas M.D.   On: 08/23/2018 05:20   Mr Jodene Nam Head/brain KX Cm  Result Date: 08/23/2018 CLINICAL DATA:  Episodic slurred speech. History of hypertension and hyperlipidemia. EXAM: MRI HEAD WITHOUT CONTRAST MRA HEAD WITHOUT CONTRAST TECHNIQUE: Multiplanar, multiecho pulse sequences of the brain and surrounding structures were obtained without intravenous contrast. Angiographic images of the head were obtained using MRA technique without contrast. COMPARISON:  CT HEAD August 22, 2018 and MRI head November 06, 2017 FINDINGS: MRI HEAD FINDINGS INTRACRANIAL CONTENTS: Faint marginal reduced diffusion of cystic bifrontal white matter infarcts without ADC abnormality. Faint  reduced diffusion RIGHT parietooccipital junction without ADC abnormality. Scattered chronic microhemorrhages. Confluent supratentorial and patchy pontine white matter FLAIR T2 hyperintensities. Hazy T2 hyperintensities bilateral basal ganglia and thalami associated with chronic hypertension, superimposed old lacunar infarcts. No advanced parenchymal brain volume loss. No hydrocephalus. No midline shift, mass effect or masses. No abnormal extra-axial fluid collections. VASCULAR: Normal major intracranial vascular flow voids present at skull base. SKULL AND UPPER CERVICAL SPINE: No abnormal sellar expansion. No suspicious calvarial bone marrow signal. Grade 1 C3-4 anterolisthesis. Pannus about the odontoid process seen with  CPPD. Craniocervical junction maintained. SINUSES/ORBITS: The mastoid air-cells and included paranasal sinuses are well-aerated.The included ocular globes and orbital contents are non-suspicious. OTHER: None. MRA HEAD FINDINGS ANTERIOR CIRCULATION: Normal flow related enhancement of the included cervical, petrous, cavernous and supraclinoid internal carotid arteries. Mild stenosis RIGHT supraclinoid ICA. Patent anterior communicating artery. Patent anterior and middle cerebral arteries, moderate luminal irregularity compatible with atherosclerosis. No large vessel occlusion, flow limiting stenosis, aneurysm. POSTERIOR CIRCULATION: Codominant vertebral arteries. Severe stenosis LEFT proximal V4 segment. Vertebrobasilar arteries are patent, with normal flow related enhancement of the main branch vessels. Patent posterior cerebral arteries. Severe stenosis LEFT P2 segment. No large vessel occlusion, flow limiting stenosis,  aneurysm. ANATOMIC VARIANTS: Hypoplastic RIGHT A1 segment. Source images and MIP images were reviewed. IMPRESSION: MRI HEAD: 1. No acute intracranial process. New subacute versus chronic bifrontal white matter and RIGHT parieto-occipital junction infarcts. 2. Severe chronic small  vessel ischemic changes and old lacunar infarcts. MRA HEAD: 1. No emergent large vessel occlusion. 2. Severe stenosis LEFT V4 and LEFT P2 segments. 3. Moderate intracranial atherosclerosis. Electronically Signed   By: Elon Alas M.D.   On: 08/23/2018 05:20   Laurey Morale, MSN, NP-C [facility] [phone]  08/23/2018, 10:26 AM   Attending physician note to follow with Assessment and plan .   Assessment: 77 y.o. female with PMH significant for HTN, HLD who presented to [place] 08/22/18 with c/o speech difficulty. Transferred to [facility] for MRI/MRA. neurology consulted for stroke seen on MRI. CTH: no hemorrhage. MRI: right parieto-occipital junction infarcts subacute vs chronic.MRA: no LVO. Further stroke work-up needed.   Impression: Transient ischemic attack Chronic multiple infarcts   Recommendations: -- BP goal : normotension  --MRI Brain  --MRA of the head w/o and neck with contrast --Echocardiogram -- ASA, will add Plavix  -- High intensity Statin if LDL > 70 -- HgbA1c, fasting lipid panel -- PT consult, OT consult, Speech consult --Telemetry monitoring --Frequent neuro checks --Stroke swallow screen     --please page stroke NP  Or  PA  Or MD from 8am -4 pm  as this patient from this time will be  followed by the stroke.   You can look them up on [email or web address]  Password [email or web address]  NEUROHOSPITALIST ADDENDUM Performed a face to face diagnostic evaluation.   I have reviewed the contents of history and physical exam as documented by PA/ARNP/Resident and agree with above documentation.  I have discussed and formulated the above plan as documented. Edits to the note have been made as needed.  Impression: 77 year old female with transient slurred speech, facial droop.  MRI brain negative for acute infarcts.  Likely transient ischemic attack  Key exam findings: The back to her baseline, no focal neurological deficits noted on examination  Plan: As detailed  above.  Would add Plavix to ASA for 3 weeks, then continue aspirin alone.    Karena Addison Aroor MD [facility] [phone]   If 7pm to 7am, please call on call as listed on AMION.

## 2018-08-23 NOTE — Progress Notes (Signed)
PT Progress Note for Charges    08/23/18 1300  PT General Charges  $$ ACUTE PT VISIT 1 Visit  PT Evaluation  $PT Eval Moderate Complexity 1 Mod  PT Treatments  $Gait Training 8-22 mins  Sherie Don, [place], DPT  Acute Rehabilitation Services Pager [phone] Office [phone]

## 2018-08-23 NOTE — Progress Notes (Signed)
Patient admitted by Dr. Darrick Meigs this morning.  Patient slurred speech has resolved but MRI showed subacute versus chronic infarct in the right parieto-occipital junction without any evidence of large vessel occlusion.  Currently vital signs are stable.  She is asymptomatic.  Vitals are stable  UA is also suggestive of urinary tract infection.  At this time continue routine stroke work-up.  Neurology team has been consulted.  Continue IV Rocephin.  Call with further questions as needed.

## 2018-08-23 NOTE — Progress Notes (Signed)
  Echocardiogram 2D Echocardiogram has been performed.  Jennette Dubin 08/23/2018, 10:40 AM

## 2018-08-23 NOTE — H&P (Signed)
TRH H&P    Patient Demographics:    Claire West, is a 77 y.o. female  MRN: [id number]  DOB - 1940/12/19  Admit Date - 08/22/2018  Referring MD/NP/PA: Dr. Tomi Bamberger  Outpatient Primary MD for the patient is Sharilyn Sites, MD  Patient coming from: Home  Chief complaint-speech difficulty   HPI:    Claire West  is a 77 y.o. female, with history of hypertension, GERD, hyperlipidemia came to the ED with chief complaint of speech difficulty.  Patient states she was out for shopping with her caregiver.  She started to feel hot and actually drove home.  At home patient was found to have slurred speech which lasted for 10 to 30 minutes.  Symptoms have resolved.  She also had blurred vision.  No weakness of extremities.  No facial droop.  Patient was brought to the ED for further evaluation. Patient does not take aspirin.  She does have history of prior TIAs. MRI brain done in January 2019 showed old lacunar infarcts. She denies nausea vomiting or diarrhea. Denies chest pain or shortness of breath. In the ED, CT scan of the head showed no acute abnormality. She does complain of dysuria.  She was found to have a normal UA, given 1 dose of fosfomycin.  As she has allergy to penicillin.  Patient received ceftriaxone 2 weeks ago for UTI without any complication.   Review of systems:    In addition to the HPI above,    All other systems reviewed and are negative.   With Past History of the following :    Past Medical History:  Diagnosis Date  . Depression   . Gait instability    walks with cane  . GERD (gastroesophageal reflux disease)   . Hyperlipemia   . Hypertension   . Knee pain   . Mild cognitive impairment with memory loss   . Seasonal allergies       Past Surgical History:  Procedure Laterality Date  . ABDOMINAL HYSTERECTOMY    . ARTHROSCOPIC REPAIR ACL    . COLONOSCOPY    . COLONOSCOPY N/A  11/23/2015   Procedure: COLONOSCOPY;  Surgeon: Rogene Houston, MD;  Location: [facility];  Service: Endoscopy;  Laterality: N/A;  830  . EYE SURGERY Bilateral    Cataracts  . TOTAL KNEE ARTHROPLASTY Left 05/05/2018   Procedure: LEFT TOTAL KNEE ARTHROPLASTY;  Surgeon: Gaynelle Arabian, MD;  Location: [facility];  Service: Orthopedics;  Laterality: Left;  with block      Social History:      Social History   Tobacco Use  . Smoking status: Never Smoker  . Smokeless tobacco: Never Used  Substance Use Topics  . Alcohol use: No       Family History :     Family History  Problem Relation Age of Onset  . Lung cancer Mother   . Lung cancer Sister       Home Medications:   Prior to Admission medications   Medication Sig Start Date End Date Taking? Authorizing Provider  cholecalciferol (VITAMIN D) 1000 units tablet Take 1,000 Units by mouth daily at 12 noon.   Yes [provider]  escitalopram (LEXAPRO) 10 MG tablet Take 10 mg by mouth at bedtime. 03/07/18  Yes [provider]  fexofenadine (ALLEGRA) 180 MG tablet Take 180 mg by mouth daily.   Yes [provider]  Javier Docker Oil 500 MG CAPS Take 500 mg by mouth daily at 12 noon.   Yes [provider]  meloxicam (MOBIC) 7.5 MG tablet Take 7.5 mg by mouth daily.   Yes [provider]  metoprolol succinate (TOPROL-XL) 100 MG 24 hr tablet Take 100 mg by mouth 2 (two) times daily. Take with or immediately following a meal.   Yes [provider]  Multiple Vitamin (MULTIVITAMIN WITH MINERALS) TABS tablet Take 1 tablet by mouth daily. Women's Multivitamin   Yes [provider]  omeprazole (PRILOSEC) 20 MG capsule Take 20 mg by mouth daily before breakfast.  04/18/15  Yes [provider]  potassium chloride (K-DUR) 10 MEQ tablet Take 1 tablet (10 mEq total) by mouth daily. Patient taking differently: Take 10 mEq by mouth daily at 12 noon.  11/04/17  Yes Noemi Chapel, MD    Probiotic Product (PROBIOTIC PO) Take 1 capsule by mouth daily. Avinger   Yes [provider]  valsartan (DIOVAN) 320 MG tablet Take 320 mg by mouth daily at 3 pm.  12/18/17  Yes [provider]  methocarbamol (ROBAXIN) 500 MG tablet Take 1 tablet (500 mg total) by mouth every 6 (six) hours as needed for muscle spasms. Patient not taking: Reported on 08/22/2018 05/09/18   Edmisten, Ok Anis, PA  nitrofurantoin, macrocrystal-monohydrate, (MACROBID) 100 MG capsule Take 1 capsule (100 mg total) by mouth 2 (two) times daily. Patient not taking: Reported on 08/22/2018 07/23/18   Dorie Rank, MD     Allergies:     Allergies  Allergen Reactions  . Adhesive [Tape] Itching and Rash    Irritation at site  . Codeine Hives and Rash  . Neosporin [Neomycin-Bacitracin Zn-Polymyx] Rash  . Penicillins Rash    Oral rash/peeling Has patient had a PCN reaction causing immediate rash, facial/tongue/throat swelling, SOB or lightheadedness with hypotension: Yes Has patient had a PCN reaction causing severe rash involving mucus membranes or skin necrosis: No Has patient had a PCN reaction that required hospitalization No Has patient had a PCN reaction occurring within the last 10 years: No If all of the above answers are "NO", then may proceed with Cephalosporin use.   . Sulfa Antibiotics Hives and Rash     Physical Exam:   Vitals  Blood pressure (!) 217/96, pulse 77, temperature 98.3 F (36.8 C), temperature source Oral, resp. rate 14, height '5\' 6"'$  (1.676 m), weight 58.5 kg, SpO2 98 %.  1.  General: Appears in no acute distress  2. Psychiatric:  Intact judgement and  insight, awake alert, oriented x 3.  3. Neurologic: No focal neurological deficits, all cranial nerves intact.Strength 5/5 all 4 extremities, sensation intact all 4 extremities, plantars down going.  4. Eyes :  anicteric sclerae, moist conjunctivae with no lid lag. PERRLA.  5. ENMT:  Oropharynx clear with  moist mucous membranes and good dentition  6. Neck:  supple, no cervical lymphadenopathy appriciated, No thyromegaly  7. Respiratory : Normal respiratory effort, good air movement bilaterally,clear to  auscultation bilaterally  8. Cardiovascular : RRR, no gallops, rubs or murmurs, no leg edema  9. Gastrointestinal:  Positive bowel sounds, abdomen  soft, non-tender to palpation,no hepatosplenomegaly, no rigidity or guarding       10. Skin:  No cyanosis, normal texture and turgor, no rash, lesions or ulcers  11.Musculoskeletal:  Good muscle tone,  joints appear normal , no effusions,  normal range of motion    Data Review:    CBC Recent Labs  Lab 08/22/18 2032  WBC 6.0  HGB 12.1  HCT 36.3  PLT 181  MCV 93.6  MCH 31.2  MCHC 33.3  RDW 12.4  LYMPHSABS 1.2  MONOABS 0.7  EOSABS 0.1  BASOSABS 0.0   ------------------------------------------------------------------------------------------------------------------  Results for orders placed or performed during the hospital encounter of 08/22/18 (from the past 48 hour(s))  Ethanol     Status: None   Collection Time: 08/22/18  8:32 PM  Result Value Ref Range   Alcohol, Ethyl (B) <10 <10 mg/dL    Comment: Performed at [facility], [address], [place], [place] [address]  Protime-INR     Status: None   Collection Time: 08/22/18  8:32 PM  Result Value Ref Range   Prothrombin Time 13.9 11.4 - 15.2 seconds   INR 1.08     Comment: Performed at [facility], [address], [place], [place] [address]  APTT     Status: None   Collection Time: 08/22/18  8:32 PM  Result Value Ref Range   aPTT 30 24 - 36 seconds    Comment: Performed at [facility], [address] [place], [place] [address]  CBC     Status: None   Collection Time: 08/22/18  8:32 PM  Result Value Ref Range   WBC 6.0 4.0 - 10.5 K/uL   RBC 3.88 3.87 - 5.11 MIL/uL   Hemoglobin 12.1 12.0 - 15.0 g/dL   HCT 36.3 36.0 - 46.0 %   MCV 93.6 80.0 - 100.0 fL   MCH  31.2 26.0 - 34.0 pg   MCHC 33.3 30.0 - 36.0 g/dL   RDW 12.4 11.5 - 15.5 %   Platelets 181 150 - 400 K/uL   nRBC 0.0 0.0 - 0.2 %    Comment: Performed at [facility], [address], [place], [place] [address]  Differential     Status: None   Collection Time: 08/22/18  8:32 PM  Result Value Ref Range   Neutrophils Relative % 65 %   Neutro Abs 3.9 1.7 - 7.7 K/uL   Lymphocytes Relative 20 %   Lymphs Abs 1.2 0.7 - 4.0 K/uL   Monocytes Relative 12 %   Monocytes Absolute 0.7 0.1 - 1.0 K/uL   Eosinophils Relative 2 %   Eosinophils Absolute 0.1 0.0 - 0.5 K/uL   Basophils Relative 1 %   Basophils Absolute 0.0 0.0 - 0.1 K/uL   Immature Granulocytes 0 %   Abs Immature Granulocytes 0.02 0.00 - 0.07 K/uL    Comment: Performed at [facility], [address] [place], [place] [address]  Comprehensive metabolic panel     Status: Abnormal   Collection Time: 08/22/18  8:32 PM  Result Value Ref Range   Sodium 134 (L) 135 - 145 mmol/L   Potassium 4.0 3.5 - 5.1 mmol/L   Chloride 100 98 - 111 mmol/L   CO2 27 22 - 32 mmol/L   Glucose, Bld 117 (H) 70 - 99 mg/dL   BUN 11 8 - 23 mg/dL   Creatinine, Ser 0.66 0.44 - 1.00 mg/dL   Calcium 9.0 8.9 - 10.3 mg/dL   Total Protein 6.5 6.5 -  8.1 g/dL   Albumin 3.8 3.5 - 5.0 g/dL   AST 25 15 - 41 U/L   ALT 16 0 - 44 U/L   Alkaline Phosphatase 81 38 - 126 U/L   Total Bilirubin 0.7 0.3 - 1.2 mg/dL   GFR calc non Af Amer >60 >60 mL/min   GFR calc Af Amer >60 >60 mL/min    Comment: (NOTE) The eGFR has been calculated using the CKD EPI equation. This calculation has not been validated in all clinical situations. eGFR's persistently <60 mL/min signify possible Chronic Kidney Disease.    Anion gap 7 5 - 15    Comment: Performed at [facility], [address], [place], [place] [address]  I-stat troponin, ED (not at [facility], [facility])     Status: None   Collection Time: 08/22/18  8:39 PM  Result Value Ref Range   Troponin i, poc 0.01 0.00 - 0.08 ng/mL   Comment 3             Comment: Due to the release kinetics of cTnI, a negative result within the first hours of the onset of symptoms does not rule out myocardial infarction with certainty. If myocardial infarction is still suspected, repeat the test at appropriate intervals.   Urine rapid drug screen (hosp performed)not at [facility]     Status: None   Collection Time: 08/22/18  9:05 PM  Result Value Ref Range   Opiates NONE DETECTED NONE DETECTED   Cocaine NONE DETECTED NONE DETECTED   Benzodiazepines NONE DETECTED NONE DETECTED   Amphetamines NONE DETECTED NONE DETECTED   Tetrahydrocannabinol NONE DETECTED NONE DETECTED   Barbiturates NONE DETECTED NONE DETECTED    Comment: (NOTE) DRUG SCREEN FOR MEDICAL PURPOSES ONLY.  IF CONFIRMATION IS NEEDED FOR ANY PURPOSE, NOTIFY LAB WITHIN 5 DAYS. LOWEST DETECTABLE LIMITS FOR URINE DRUG SCREEN Drug Class                     Cutoff (ng/mL) Amphetamine and metabolites    1000 Barbiturate and metabolites    200 Benzodiazepine                 170 Tricyclics and metabolites     300 Opiates and metabolites        300 Cocaine and metabolites        300 THC                            50 Performed at [facility], [address], [place], [place] [address]   Urinalysis, Routine w reflex microscopic (not at [facility])     Status: Abnormal   Collection Time: 08/22/18  9:05 PM  Result Value Ref Range   Color, Urine YELLOW YELLOW   APPearance HAZY (A) CLEAR   Specific Gravity, Urine 1.003 (L) 1.005 - 1.030   pH 7.0 5.0 - 8.0   Glucose, UA NEGATIVE NEGATIVE mg/dL   Hgb urine dipstick NEGATIVE NEGATIVE   Bilirubin Urine NEGATIVE NEGATIVE   Ketones, ur NEGATIVE NEGATIVE mg/dL   Protein, ur NEGATIVE NEGATIVE mg/dL   Nitrite POSITIVE (A) NEGATIVE   Leukocytes, UA LARGE (A) NEGATIVE   RBC / HPF 0-5 0 - 5 RBC/hpf   WBC, UA >50 (H) 0 - 5 WBC/hpf   Bacteria, UA RARE (A) NONE SEEN   Squamous Epithelial / LPF 0-5 0 - 5    Comment: Performed at [facility], [address] [place], [place]  [place]  Lab 08/22/18 2032  NA 134*  K 4.0  CL 100  CO2 27  GLUCOSE 117*  BUN 11  CREATININE 0.66  CALCIUM 9.0  AST 25  ALT 16  ALKPHOS 81  BILITOT 0.7   ------------------------------------------------------------------------------------------------------------------  ------------------------------------------------------------------------------------------------------------------ GFR: Estimated Creatinine Clearance: 55.3 mL/min (by C-G formula based on SCr of 0.66 mg/dL). Liver Function Tests: Recent Labs  Lab 08/22/18 2032  AST 25  ALT 16  ALKPHOS 81  BILITOT 0.7  PROT 6.5  ALBUMIN 3.8   No results for input(s): LIPASE, AMYLASE in the last 168 hours. No results for input(s): AMMONIA in the last 168 hours. Coagulation Profile: Recent Labs  Lab 08/22/18 2032  INR 1.08    --------------------------------------------------------------------------------------------------------------- Urine analysis:    Component Value Date/Time   COLORURINE YELLOW 08/22/2018 2105   APPEARANCEUR HAZY (A) 08/22/2018 2105   LABSPEC 1.003 (L) 08/22/2018 2105   PHURINE 7.0 08/22/2018 2105   GLUCOSEU NEGATIVE 08/22/2018 2105   HGBUR NEGATIVE 08/22/2018 2105   BILIRUBINUR NEGATIVE 08/22/2018 2105   KETONESUR NEGATIVE 08/22/2018 2105   PROTEINUR NEGATIVE 08/22/2018 2105   NITRITE POSITIVE (A) 08/22/2018 2105   LEUKOCYTESUR LARGE (A) 08/22/2018 2105      Imaging Results:    Ct Head Wo Contrast  Result Date: 08/22/2018 CLINICAL DATA:  Altered mental status today EXAM: CT HEAD WITHOUT CONTRAST TECHNIQUE: Contiguous axial images were obtained from the base of the skull through the vertex without intravenous contrast. COMPARISON:  07/23/2018 FINDINGS: Brain: Chronic stable small vessel ischemic disease of periventricular white matter. Stable or focal right frontal white matter hypodensity/encephalomalacia. Age related involutional  changes of the brain are demonstrated. No acute intracranial hemorrhage large vascular territory infarct, hydrocephalus, midline shift or edema. Midline fourth ventricle and basal cisterns without effacement. Small chronic caudate lacunar infarcts are noted bilaterally. Vascular: No hyperdense vessel sign. No unexpected calcifications. Atherosclerosis of the carotid siphons and both vertebral arteries. Skull: Intact Sinuses/Orbits: Nonacute Other: None IMPRESSION: Chronic small vessel ischemic disease of periventricular white matter. No acute intracranial abnormality. Electronically Signed   By: Ashley Royalty M.D.   On: 08/22/2018 20:55    My personal review of EKG: Rhythm normal sinus rhythm   Assessment & Plan:    Active Problems:   TIA (transient ischemic attack)   1. TIA-patient's neurological symptoms have resolved, will place him to observation for TIA work-up.  Will obtain echocardiogram, MRI/MRA brain, carotid Dopplers in a.m.  Start aspirin 325 mg p.o. Daily.  Patient will be transferred to [facility] as MRI is not available at [facility] over the weekend.  2. UTI-patient has a normal UA, she was given 1 dose of fosfomycin in the ED.  Will start ceftriaxone from tomorrow morning as patient says that she has taken ceftriaxone without any complication.  Follow urine culture results.  3. Hypertensive urgency-start hydralazine PRN for blood pressure greater than 160/100.  Continue Toprol-XL, Diovan   DVT Prophylaxis-   Lovenox   AM Labs Ordered, also please review Full Orders  Family Communication: Admission, patients condition and plan of care including tests being ordered have been discussed with the patient and her sister at bedside who indicate understanding and agree with the plan and Code Status.  Code Status: Full code  Admission status: Observation  Time spent in minutes : 60 minutes   Oswald Hillock M.D on 08/23/2018 at 12:39 AM  Between 7am to 7pm - Pager -  [phone].  After 7pm go to [email or web address] - password [email or web address]  [facility] - Office  [phone]

## 2018-08-23 NOTE — Progress Notes (Signed)
PT Cancellation Note  Patient Details Name: Claire QUIZHPI MRN: [id number] DOB: 10-Apr-1941   Cancelled Treatment:    Reason Eval/Treat Not Completed: Patient at procedure or test/unavailable. Pt off of the floor to vascular. PT will continue to follow acutely as available.    [place] 08/23/2018, 9:41 AM

## 2018-08-23 NOTE — Progress Notes (Signed)
VASCULAR LAB PRELIMINARY  PRELIMINARY  PRELIMINARY  PRELIMINARY  Carotid duplex completed.    Preliminary report:  1-39% ICA plaquing. Vertebral artery flow is antegrade.   Shahil Speegle, RVT 08/23/2018, 10:10 AM

## 2018-08-24 DIAGNOSIS — I10 Essential (primary) hypertension: Secondary | ICD-10-CM | POA: Diagnosis not present

## 2018-08-24 DIAGNOSIS — I639 Cerebral infarction, unspecified: Secondary | ICD-10-CM

## 2018-08-24 MED ORDER — PRAVASTATIN SODIUM 20 MG PO TABS: 20.0000 mg | ORAL_TABLET | Freq: Every day | ORAL | Status: DC

## 2018-08-24 MED ORDER — PRAVASTATIN SODIUM 20 MG PO TABS: 20.0000 mg | ORAL_TABLET | Freq: Every day | ORAL | 0 refills | Status: DC

## 2018-08-24 MED ORDER — ASPIRIN EC 81 MG PO TBEC
81.0000 mg | DELAYED_RELEASE_TABLET | Freq: Every day | ORAL | Status: DC
  Administered 2018-08-24: 81 mg via ORAL
  Filled 2018-08-24: qty 1

## 2018-08-24 MED ORDER — CEFPODOXIME PROXETIL 200 MG PO TABS: 200.0000 mg | ORAL_TABLET | Freq: Two times a day (BID) | ORAL | 0 refills | Status: AC

## 2018-08-24 MED ORDER — SENNOSIDES-DOCUSATE SODIUM 8.6-50 MG PO TABS: 1.0000 | ORAL_TABLET | Freq: Every evening | ORAL | 0 refills | Status: DC | PRN

## 2018-08-24 MED ORDER — ASPIRIN 81 MG PO TBEC: 81.0000 mg | DELAYED_RELEASE_TABLET | Freq: Every day | ORAL | 1 refills | Status: AC

## 2018-08-24 MED ORDER — CLOPIDOGREL BISULFATE 75 MG PO TABS: 75.0000 mg | ORAL_TABLET | Freq: Every day | ORAL | 0 refills | Status: AC

## 2018-08-24 NOTE — Care Management (Signed)
Referral for Outpatient PT placed.

## 2018-08-24 NOTE — Discharge Summary (Signed)
Physician Discharge Summary  Claire West CVE:[id number] DOB: Feb 03, 1941 DOA: 08/22/2018  PCP: Sharilyn Sites, MD  Admit date: 08/22/2018 Discharge date: 08/24/2018  Admitted From: Home Disposition: Home  Recommendations for Outpatient Follow-up:  1. Follow up with PCP in 1-2 weeks 2. Please obtain BMP/CBC in one week your next doctors visit.  3. Aspirin 81 mg and Plavix 75 mg daily for 3 weeks followed by aspirin alone. 4. Follow-up outpatient with neurology in 3-4 weeks 5. Pravastatin 20 mg daily 6. Vantin orally for UTI for 4 days  Discharge Condition: Stable CODE STATUS: Full Diet recommendation: 2 g salt diet  Brief/Interim Summary: 77 year old female with history of essential hypertension, GERD, hyperlipidemia came to the hospital with complains of speech difficulty.  By the time she came to the ER her symptoms had significantly improved.  CT of the head was negative.  She was found to have urinary tract infection therefore started on Rocephin.  MRI of the brain showed new subacute versus chronic bifrontal white matter and right parieto-occipital junction infarct.  Over the course of 24 hours symptoms return back to baseline.  Neurology recommended aspirin and Plavix for 3 weeks followed by aspirin alone.  Physical therapy recommended outpatient PT.  At this point patient has reached maximum benefit from hospital stay and stable to be discharged   Discharge Diagnoses:  Active Problems:   HTN (hypertension)   GERD (gastroesophageal reflux disease)   OA (osteoarthritis) of knee   TIA (transient ischemic attack)  Slurred speech with acute/subacute infarct in right parieto-occipital junction - MRI of the brain showed new subacute/acute infarct and right parieto-occipital infarct -Neurology recommends aspirin and Plavix for 3 weeks followed by aspirin alone.  Follow-up outpatient with neurology - Continue supportive care.  LDL is 117, hemoglobin A1c 5.0  Urinary tract  infection without hematuria - We will transition IV Rocephin to oral Vantin for 5 more days.  Previously she has grown E. coli which was sensitive to it.  Essential hypertension -Resume home meds  Patient is a full code Daughter is present at the bedside Discharged today in stable condition.  Discharge Instructions   Allergies as of 08/24/2018      Reactions   Adhesive [tape] Itching, Rash   Irritation at site   Codeine Hives, Rash   Neosporin [neomycin-bacitracin Zn-polymyx] Rash   Penicillins Rash   Oral rash/peeling Has patient had a PCN reaction causing immediate rash, facial/tongue/throat swelling, SOB or lightheadedness with hypotension: Yes Has patient had a PCN reaction causing severe rash involving mucus membranes or skin necrosis: No Has patient had a PCN reaction that required hospitalization No Has patient had a PCN reaction occurring within the last 10 years: No If all of the above answers are "NO", then may proceed with Cephalosporin use.   Sulfa Antibiotics Hives, Rash      Medication List    STOP taking these medications   meloxicam 7.5 MG tablet Commonly known as:  MOBIC     TAKE these medications   aspirin 81 MG EC tablet Take 1 tablet (81 mg total) by mouth daily.   cefpodoxime 200 MG tablet Commonly known as:  VANTIN Take 1 tablet (200 mg total) by mouth 2 (two) times daily for 4 days.   cholecalciferol 25 MCG (1000 UT) tablet Commonly known as:  VITAMIN D Take 1,000 Units by mouth daily at 12 noon.   clopidogrel 75 MG tablet Commonly known as:  PLAVIX Take 1 tablet (75 mg total) by mouth daily  for 18 days.   escitalopram 10 MG tablet Commonly known as:  LEXAPRO Take 10 mg by mouth at bedtime.   fexofenadine 180 MG tablet Commonly known as:  ALLEGRA Take 180 mg by mouth daily.   Krill Oil 500 MG Caps Take 500 mg by mouth daily at 12 noon.   methocarbamol 500 MG tablet Commonly known as:  ROBAXIN Take 1 tablet (500 mg total) by mouth  every 6 (six) hours as needed for muscle spasms.   metoprolol succinate 100 MG 24 hr tablet Commonly known as:  TOPROL-XL Take 100 mg by mouth 2 (two) times daily. Take with or immediately following a meal.   multivitamin with minerals Tabs tablet Take 1 tablet by mouth daily. Women's Multivitamin   nitrofurantoin (macrocrystal-monohydrate) 100 MG capsule Commonly known as:  MACROBID Take 1 capsule (100 mg total) by mouth 2 (two) times daily.   omeprazole 20 MG capsule Commonly known as:  PRILOSEC Take 20 mg by mouth daily before breakfast.   potassium chloride 10 MEQ tablet Commonly known as:  K-DUR Take 1 tablet (10 mEq total) by mouth daily. What changed:  when to take this   pravastatin 20 MG tablet Commonly known as:  PRAVACHOL Take 1 tablet (20 mg total) by mouth daily at 6 PM.   PROBIOTIC PO Take 1 capsule by mouth daily. 25 Billion   senna-docusate 8.6-50 MG tablet Commonly known as:  Senokot-S Take 1 tablet by mouth at bedtime as needed for moderate constipation.   valsartan 320 MG tablet Commonly known as:  DIOVAN Take 320 mg by mouth daily at 3 pm.      Follow-up Information    Sharilyn Sites, MD. Schedule an appointment as soon as possible for a visit in 1 week(s).   Specialty:  Family Medicine Contact information: [address] [place] [address] [phone]        [facility]. Schedule an appointment as soon as possible for a visit in 4 week(s).   Contact information: [address]     [address] [place] [address] [phone]         Allergies  Allergen Reactions  . Adhesive [Tape] Itching and Rash    Irritation at site  . Codeine Hives and Rash  . Neosporin [Neomycin-Bacitracin Zn-Polymyx] Rash  . Penicillins Rash    Oral rash/peeling Has patient had a PCN reaction causing immediate rash, facial/tongue/throat swelling, SOB or lightheadedness with hypotension: Yes Has patient had a PCN  reaction causing severe rash involving mucus membranes or skin necrosis: No Has patient had a PCN reaction that required hospitalization No Has patient had a PCN reaction occurring within the last 10 years: No If all of the above answers are "NO", then may proceed with Cephalosporin use.   . Sulfa Antibiotics Hives and Rash    You were cared for by a hospitalist during your hospital stay. If you have any questions about your discharge medications or the care you received while you were in the hospital after you are discharged, you can call the unit and asked to speak with the hospitalist on call if the hospitalist that took care of you is not available. Once you are discharged, your primary care physician will handle any further medical issues. Please note that no refills for any discharge medications will be authorized once you are discharged, as it is imperative that you return to your primary care physician (or establish a relationship with a primary care physician if you  do not have one) for your aftercare needs so that they can reassess your need for medications and monitor your lab values.  Consultations:  Neurology   Procedures/Studies: Dg Chest 2 View  Result Date: 08/23/2018 CLINICAL DATA:  Slurred speech, mouth droop. EXAM: CHEST - 2 VIEW COMPARISON:  Chest radiograph December 25, 2017 FINDINGS: Cardiac silhouette is mildly enlarged. Mediastinal silhouette is normal. Calcified aortic arch. No pleural effusions or focal consolidations. Trachea projects midline and there is no pneumothorax. Soft tissue planes and included osseous structures are non-suspicious. Calcified bilateral breast implants. IMPRESSION: Mild cardiomegaly.  No acute pulmonary process. Electronically Signed   By: Elon Alas M.D.   On: 08/23/2018 01:14   Ct Head Wo Contrast  Result Date: 08/22/2018 CLINICAL DATA:  Altered mental status today EXAM: CT HEAD WITHOUT CONTRAST TECHNIQUE: Contiguous axial images were  obtained from the base of the skull through the vertex without intravenous contrast. COMPARISON:  07/23/2018 FINDINGS: Brain: Chronic stable small vessel ischemic disease of periventricular white matter. Stable or focal right frontal white matter hypodensity/encephalomalacia. Age related involutional changes of the brain are demonstrated. No acute intracranial hemorrhage large vascular territory infarct, hydrocephalus, midline shift or edema. Midline fourth ventricle and basal cisterns without effacement. Small chronic caudate lacunar infarcts are noted bilaterally. Vascular: No hyperdense vessel sign. No unexpected calcifications. Atherosclerosis of the carotid siphons and both vertebral arteries. Skull: Intact Sinuses/Orbits: Nonacute Other: None IMPRESSION: Chronic small vessel ischemic disease of periventricular white matter. No acute intracranial abnormality. Electronically Signed   By: Ashley Royalty M.D.   On: 08/22/2018 20:55   Mr Brain Wo Contrast  Result Date: 08/23/2018 CLINICAL DATA:  Episodic slurred speech. History of hypertension and hyperlipidemia. EXAM: MRI HEAD WITHOUT CONTRAST MRA HEAD WITHOUT CONTRAST TECHNIQUE: Multiplanar, multiecho pulse sequences of the brain and surrounding structures were obtained without intravenous contrast. Angiographic images of the head were obtained using MRA technique without contrast. COMPARISON:  CT HEAD August 22, 2018 and MRI head November 06, 2017 FINDINGS: MRI HEAD FINDINGS INTRACRANIAL CONTENTS: Faint marginal reduced diffusion of cystic bifrontal white matter infarcts without ADC abnormality. Faint reduced diffusion RIGHT parietooccipital junction without ADC abnormality. Scattered chronic microhemorrhages. Confluent supratentorial and patchy pontine white matter FLAIR T2 hyperintensities. Hazy T2 hyperintensities bilateral basal ganglia and thalami associated with chronic hypertension, superimposed old lacunar infarcts. No advanced parenchymal brain  volume loss. No hydrocephalus. No midline shift, mass effect or masses. No abnormal extra-axial fluid collections. VASCULAR: Normal major intracranial vascular flow voids present at skull base. SKULL AND UPPER CERVICAL SPINE: No abnormal sellar expansion. No suspicious calvarial bone marrow signal. Grade 1 C3-4 anterolisthesis. Pannus about the odontoid process seen with CPPD. Craniocervical junction maintained. SINUSES/ORBITS: The mastoid air-cells and included paranasal sinuses are well-aerated.The included ocular globes and orbital contents are non-suspicious. OTHER: None. MRA HEAD FINDINGS ANTERIOR CIRCULATION: Normal flow related enhancement of the included cervical, petrous, cavernous and supraclinoid internal carotid arteries. Mild stenosis RIGHT supraclinoid ICA. Patent anterior communicating artery. Patent anterior and middle cerebral arteries, moderate luminal irregularity compatible with atherosclerosis. No large vessel occlusion, flow limiting stenosis, aneurysm. POSTERIOR CIRCULATION: Codominant vertebral arteries. Severe stenosis LEFT proximal V4 segment. Vertebrobasilar arteries are patent, with normal flow related enhancement of the main branch vessels. Patent posterior cerebral arteries. Severe stenosis LEFT P2 segment. No large vessel occlusion, flow limiting stenosis,  aneurysm. ANATOMIC VARIANTS: Hypoplastic RIGHT A1 segment. Source images and MIP images were reviewed. IMPRESSION: MRI HEAD: 1. No acute intracranial process. New subacute versus chronic bifrontal white  matter and RIGHT parieto-occipital junction infarcts. 2. Severe chronic small vessel ischemic changes and old lacunar infarcts. MRA HEAD: 1. No emergent large vessel occlusion. 2. Severe stenosis LEFT V4 and LEFT P2 segments. 3. Moderate intracranial atherosclerosis. Electronically Signed   By: Elon Alas M.D.   On: 08/23/2018 05:20   Mr Jodene Nam Head/brain YI Cm  Result Date: 08/23/2018 CLINICAL DATA:  Episodic slurred  speech. History of hypertension and hyperlipidemia. EXAM: MRI HEAD WITHOUT CONTRAST MRA HEAD WITHOUT CONTRAST TECHNIQUE: Multiplanar, multiecho pulse sequences of the brain and surrounding structures were obtained without intravenous contrast. Angiographic images of the head were obtained using MRA technique without contrast. COMPARISON:  CT HEAD August 22, 2018 and MRI head November 06, 2017 FINDINGS: MRI HEAD FINDINGS INTRACRANIAL CONTENTS: Faint marginal reduced diffusion of cystic bifrontal white matter infarcts without ADC abnormality. Faint reduced diffusion RIGHT parietooccipital junction without ADC abnormality. Scattered chronic microhemorrhages. Confluent supratentorial and patchy pontine white matter FLAIR T2 hyperintensities. Hazy T2 hyperintensities bilateral basal ganglia and thalami associated with chronic hypertension, superimposed old lacunar infarcts. No advanced parenchymal brain volume loss. No hydrocephalus. No midline shift, mass effect or masses. No abnormal extra-axial fluid collections. VASCULAR: Normal major intracranial vascular flow voids present at skull base. SKULL AND UPPER CERVICAL SPINE: No abnormal sellar expansion. No suspicious calvarial bone marrow signal. Grade 1 C3-4 anterolisthesis. Pannus about the odontoid process seen with CPPD. Craniocervical junction maintained. SINUSES/ORBITS: The mastoid air-cells and included paranasal sinuses are well-aerated.The included ocular globes and orbital contents are non-suspicious. OTHER: None. MRA HEAD FINDINGS ANTERIOR CIRCULATION: Normal flow related enhancement of the included cervical, petrous, cavernous and supraclinoid internal carotid arteries. Mild stenosis RIGHT supraclinoid ICA. Patent anterior communicating artery. Patent anterior and middle cerebral arteries, moderate luminal irregularity compatible with atherosclerosis. No large vessel occlusion, flow limiting stenosis, aneurysm. POSTERIOR CIRCULATION: Codominant vertebral  arteries. Severe stenosis LEFT proximal V4 segment. Vertebrobasilar arteries are patent, with normal flow related enhancement of the main branch vessels. Patent posterior cerebral arteries. Severe stenosis LEFT P2 segment. No large vessel occlusion, flow limiting stenosis,  aneurysm. ANATOMIC VARIANTS: Hypoplastic RIGHT A1 segment. Source images and MIP images were reviewed. IMPRESSION: MRI HEAD: 1. No acute intracranial process. New subacute versus chronic bifrontal white matter and RIGHT parieto-occipital junction infarcts. 2. Severe chronic small vessel ischemic changes and old lacunar infarcts. MRA HEAD: 1. No emergent large vessel occlusion. 2. Severe stenosis LEFT V4 and LEFT P2 segments. 3. Moderate intracranial atherosclerosis. Electronically Signed   By: Elon Alas M.D.   On: 08/23/2018 05:20   Vas US Carotid  Result Date: 08/24/2018 Carotid Arterial Duplex Study Indications:       Speech disturbance. Risk Factors:      Hypertension, hyperlipidemia. Limitations:       Tortuosity Comparison Study:  No prior study on file for comparison. Performing Technologist: Sharion Dove RVS  Examination Guidelines: A complete evaluation includes B-mode imaging, spectral Doppler, color Doppler, and power Doppler as needed of all accessible portions of each vessel. Bilateral testing is considered an integral part of a complete examination. Limited examinations for reoccurring indications may be performed as noted.  Right Carotid Findings: +----------+--------+--------+--------+------------+------------------+           PSV cm/sEDV cm/sStenosisDescribe    Comments           +----------+--------+--------+--------+------------+------------------+ CCA Prox  71      14  intimal thickening +----------+--------+--------+--------+------------+------------------+ CCA Distal65      12                          intimal thickening  +----------+--------+--------+--------+------------+------------------+ ICA Prox  76      21              heterogenous                   +----------+--------+--------+--------+------------+------------------+ ICA Distal145     35                          tortuous           +----------+--------+--------+--------+------------+------------------+ ECA       91      11                                             +----------+--------+--------+--------+------------+------------------+ +----------+--------+-------+--------+-------------------+           PSV cm/sEDV cmsDescribeArm Pressure (mmHG) +----------+--------+-------+--------+-------------------+ [id number]                                         +----------+--------+-------+--------+-------------------+ +---------+--------+--+--------+-+ VertebralPSV cm/s27EDV cm/s5 +---------+--------+--+--------+-+  Left Carotid Findings: +----------+--------+--------+--------+-----------+------------------+           PSV cm/sEDV cm/sStenosisDescribe   Comments           +----------+--------+--------+--------+-----------+------------------+ CCA Prox  83      18                         intimal thickening +----------+--------+--------+--------+-----------+------------------+ CCA Distal69      18                         intimal thickening +----------+--------+--------+--------+-----------+------------------+ ICA Prox  76      16              homogeneous                   +----------+--------+--------+--------+-----------+------------------+ ICA Distal73      18                         tortuous           +----------+--------+--------+--------+-----------+------------------+ ECA       58      9                                             +----------+--------+--------+--------+-----------+------------------+ +----------+--------+--------+--------+-------------------+ SubclavianPSV cm/sEDV  cm/sDescribeArm Pressure (mmHG) +----------+--------+--------+--------+-------------------+           141                                         +----------+--------+--------+--------+-------------------+ +---------+--------+--+--------+--+ VertebralPSV cm/s64EDV cm/s15 +---------+--------+--+--------+--+  Summary: Right Carotid: The extracranial vessels were near-normal with only minimal wall                thickening or plaque. Left Carotid: The extracranial  vessels were near-normal with only minimal wall               thickening or plaque. Vertebrals:  Bilateral vertebral arteries demonstrate antegrade flow. Subclavians: Normal flow hemodynamics were seen in bilateral subclavian              arteries. *See table(s) above for measurements and observations.  Electronically signed by Ruta Hinds MD on 08/24/2018 at 9:19:41 AM.    Final      Subjective: Feels better, no complaints.  General = no fevers, chills, dizziness, malaise, fatigue HEENT/EYES = negative for pain, redness, loss of vision, double vision, blurred vision, loss of hearing, sore throat, hoarseness, dysphagia Cardiovascular= negative for chest pain, palpitation, murmurs, lower extremity swelling Respiratory/lungs= negative for shortness of breath, cough, hemoptysis, wheezing, mucus production Gastrointestinal= negative for nausea, vomiting,, abdominal pain, melena, hematemesis Genitourinary= negative for Dysuria, Hematuria, Change in Urinary Frequency MSK = Negative for arthralgia, myalgias, Back Pain, Joint swelling  Neurology= Negative for headache, seizures, numbness, tingling  Psychiatry= Negative for anxiety, depression, suicidal and homocidal ideation Allergy/Immunology= Medication/Food allergy as listed  Skin= Negative for Rash, lesions, ulcers, itching    Discharge Exam: Vitals:   08/24/18 0355 08/24/18 0801  BP: (!) 167/94 (!) 182/82  Pulse: 72 72  Resp: 18 18  Temp: 98 F (36.7 C) 98.4 F (36.9  C)  SpO2: 96% 97%   Vitals:   08/23/18 1920 08/23/18 2323 08/24/18 0355 08/24/18 0801  BP: (!) 161/77 (!) 163/78 (!) 167/94 (!) 182/82  Pulse: 79 80 72 72  Resp: 18 19 18 18   Temp: 98.5 F (36.9 C) 98.5 F (36.9 C) 98 F (36.7 C) 98.4 F (36.9 C)  TempSrc: Oral Oral Oral Oral  SpO2: 96% 98% 96% 97%  Weight:      Height:        General: Pt is alert, awake, not in acute distress Cardiovascular: RRR, S1/S2 +, no rubs, no gallops Respiratory: CTA bilaterally, no wheezing, no rhonchi Abdominal: Soft, NT, ND, bowel sounds + Extremities: no edema, no cyanosis    The results of significant diagnostics from this hospitalization (including imaging, microbiology, ancillary and laboratory) are listed below for reference.     Microbiology: Recent Results (from the past 240 hour(s))  Urine Culture     Status: Abnormal (Preliminary result)   Collection Time: 08/22/18  9:05 PM  Result Value Ref Range Status   Specimen Description   Final    URINE, CLEAN CATCH Performed at [facility], [address] [place], [place] [address]    Special Requests   Final    NONE Performed at [facility], [address], [place], [place] [address]    Culture (A)  Final    >=100,000 COLONIES/mL ESCHERICHIA COLI SUSCEPTIBILITIES TO FOLLOW Performed at [facility] [address] [place],  [address]    Report Status PENDING  Incomplete     Labs: BNP (last 3 results) No results for input(s): BNP in the last 8760 hours. Basic Metabolic Panel: Recent Labs  Lab 08/22/18 2032 08/23/18 0612  NA 134*  --   K 4.0  --   CL 100  --   CO2 27  --   GLUCOSE 117*  --   BUN 11  --   CREATININE 0.66 0.69  CALCIUM 9.0  --    Liver Function Tests: Recent Labs  Lab 08/22/18 2032  AST 25  ALT 16  ALKPHOS 81  BILITOT 0.7  PROT 6.5  ALBUMIN 3.8   No results for input(s): LIPASE, AMYLASE in the last 168 hours. No results for input(s): AMMONIA in the last 168 hours. CBC: Recent  Labs  Lab 08/22/18 2032 08/23/18 0612  WBC 6.0 8.5  NEUTROABS 3.9  --   HGB 12.1 12.4  HCT 36.3 36.0  MCV 93.6 89.6  PLT 181 188   Cardiac Enzymes: No results for input(s): CKTOTAL, CKMB, CKMBINDEX, TROPONINI in the last 168 hours. BNP: Invalid input(s): POCBNP CBG: No results for input(s): GLUCAP in the last 168 hours. D-Dimer No results for input(s): DDIMER in the last 72 hours. Hgb A1c Recent Labs    08/23/18 0612  HGBA1C 5.0   Lipid Profile Recent Labs    08/23/18 0612  CHOL 186  HDL 35*  LDLCALC 117*  TRIG 170*  CHOLHDL 5.3   Thyroid function studies No results for input(s): TSH, T4TOTAL, T3FREE, THYROIDAB in the last 72 hours.  Invalid input(s): FREET3 Anemia work up No results for input(s): VITAMINB12, FOLATE, FERRITIN, TIBC, IRON, RETICCTPCT in the last 72 hours. Urinalysis    Component Value Date/Time   COLORURINE YELLOW 08/22/2018 2105   APPEARANCEUR HAZY (A) 08/22/2018 2105   LABSPEC 1.003 (L) 08/22/2018 2105   PHURINE 7.0 08/22/2018 2105   GLUCOSEU NEGATIVE 08/22/2018 2105   HGBUR NEGATIVE 08/22/2018 2105   BILIRUBINUR NEGATIVE 08/22/2018 2105   KETONESUR NEGATIVE 08/22/2018 2105   PROTEINUR NEGATIVE 08/22/2018 2105   NITRITE POSITIVE (A) 08/22/2018 2105   LEUKOCYTESUR LARGE (A) 08/22/2018 2105   Sepsis Labs Invalid input(s): PROCALCITONIN,  WBC,  LACTICIDVEN Microbiology Recent Results (from the past 240 hour(s))  Urine Culture     Status: Abnormal (Preliminary result)   Collection Time: 08/22/18  9:05 PM  Result Value Ref Range Status   Specimen Description   Final    URINE, CLEAN CATCH Performed at [facility] [address] [place], [place] [address]    Special Requests   Final    NONE Performed at [facility], [address] [place], [place] [address]    Culture (A)  Final    >=100,000 COLONIES/mL ESCHERICHIA COLI SUSCEPTIBILITIES TO FOLLOW Performed at [facility], [place] [address], [place], [place] [address]     Report Status PENDING  Incomplete     Time coordinating discharge:  I have spent 35 minutes face to face with the patient and on the ward discussing the patients care, assessment, plan and disposition with other care givers. >50% of the time was devoted counseling the patient about the risks and benefits of treatment/Discharge disposition and coordinating care.   SIGNED:   Damita Lack, MD  [facility] 08/24/2018, 11:40 AM Pager   If 7PM-7AM, please contact night-coverage [email or web address] Password [email or web address]

## 2018-08-24 NOTE — Plan of Care (Signed)
Patient was discharged before being seen.  As per chart.  Patient is a 77 year old female with history of hypertension, hyperlipidemia admitted for feeling hot, had to sit down, wheezing feeding over nausea vomiting and diplopia.  Afterward patient had a symptoms of slurred speech and the facial droop lasting 10 to 15 minutes.  CT showed right frontal white matter encephalomalacia.  MRI bilateral frontal white matter encephalomalacia, no acute infarct.  MRA showed left V4 and left P2 stenosis.  EF 50 to 55%.  Carotid Doppler unremarkable.  A1c 5.0 and LDL 117.  Her blood pressure was very high on presentation and during admission.  UA showed UTI and was put on antibiotics. Not sure about episode of TIA or hypertensive encephalopathy or UTI related encephalopathy.  Agree with aspirin 81 Plavix 75 for 3 weeks and then aspirin alone.  Add pravastatin for hyperlipidemia.  Patient will follow-up with neurology as needed.  Rosalin Hawking, MD PhD Stroke Neurology 08/24/2018 7:09 PM

## 2018-08-25 DIAGNOSIS — R2681 Unsteadiness on feet: Secondary | ICD-10-CM | POA: Diagnosis not present

## 2018-08-25 DIAGNOSIS — R262 Difficulty in walking, not elsewhere classified: Secondary | ICD-10-CM | POA: Diagnosis not present

## 2018-08-25 DIAGNOSIS — M6281 Muscle weakness (generalized): Secondary | ICD-10-CM | POA: Diagnosis not present

## 2018-08-25 LAB — URINE CULTURE: Culture: >=100000 — AB

## 2018-08-29 DIAGNOSIS — I639 Cerebral infarction, unspecified: Secondary | ICD-10-CM | POA: Diagnosis not present

## 2018-08-29 DIAGNOSIS — G459 Transient cerebral ischemic attack, unspecified: Secondary | ICD-10-CM | POA: Diagnosis not present

## 2018-08-29 DIAGNOSIS — Z6822 Body mass index (BMI) 22.0-22.9, adult: Secondary | ICD-10-CM | POA: Diagnosis not present

## 2018-08-31 DIAGNOSIS — R262 Difficulty in walking, not elsewhere classified: Secondary | ICD-10-CM | POA: Diagnosis not present

## 2018-08-31 DIAGNOSIS — M6281 Muscle weakness (generalized): Secondary | ICD-10-CM | POA: Diagnosis not present

## 2018-08-31 DIAGNOSIS — R2681 Unsteadiness on feet: Secondary | ICD-10-CM | POA: Diagnosis not present

## 2018-09-01 ENCOUNTER — Telehealth: Payer: Self-pay | Admitting: Neurology

## 2018-09-01 DIAGNOSIS — R2681 Unsteadiness on feet: Secondary | ICD-10-CM | POA: Diagnosis not present

## 2018-09-01 DIAGNOSIS — R262 Difficulty in walking, not elsewhere classified: Secondary | ICD-10-CM | POA: Diagnosis not present

## 2018-09-01 DIAGNOSIS — M6281 Muscle weakness (generalized): Secondary | ICD-10-CM | POA: Diagnosis not present

## 2018-09-01 NOTE — Telephone Encounter (Signed)
Pt along with care taker has called.  Pt had a mini stroke last week nad hospital put pt on Plavix for [redacted] weeks along with an aspirin a day.  Pt PCP Dr Armandina Gemma would like to know if Dr Brett Fairy would write pt a script to stay on the Plavix 75mg (Generic-Clopidogrel) until appointment date.  [place], [place]  Pt is asking for a call to discuss

## 2018-09-01 NOTE — Telephone Encounter (Signed)
Please let her take it until January 1st, that is not that much longer-  Then will do the follow up neurology visit.  The family  felt uneasy about the d/c of Plavix when we last spoke.

## 2018-09-01 NOTE — Telephone Encounter (Signed)
Spoke with Dr Brett Fairy and advised that the patient and caregiver were already aware to stop the plavix and they accepted the stopping of the medication. Dr Dohmeier agreed ok to just follow up in office visit.

## 2018-09-01 NOTE — Telephone Encounter (Signed)
Called and spoke with the caretaker. I read over the neurologist notes in the hospital setting. At dc they stated to keep patient on asa and plavix for 3 wks and then asa alone.the caretaker states she understood that but she wanted to know if the patient should stay on it longer. I advised that since the patient didn't have a new acute infarct and this was ruled as a potential TIA that sometimes keeping the pt on plavix as a long term preventative especially at her age can actually put her at risk for falling and sustaining head bleeds. Advised the patient's caretaker that we can certainly discuss this more at the follow up visit but since the patient has not been seen her at this time for stroke then its best to follow the dc plan made by the stroke team in the hospital. Patient's caretaker verbalized understanding. I did inform that I would make Dr Dohmeier aware and if she thinks differently I wil call back and make them aware otherwise we will see them at follow up visit in Jan.

## 2018-09-03 DIAGNOSIS — M6281 Muscle weakness (generalized): Secondary | ICD-10-CM | POA: Diagnosis not present

## 2018-09-03 DIAGNOSIS — R262 Difficulty in walking, not elsewhere classified: Secondary | ICD-10-CM | POA: Diagnosis not present

## 2018-09-03 DIAGNOSIS — R2681 Unsteadiness on feet: Secondary | ICD-10-CM | POA: Diagnosis not present

## 2018-09-08 DIAGNOSIS — M6281 Muscle weakness (generalized): Secondary | ICD-10-CM | POA: Diagnosis not present

## 2018-09-08 DIAGNOSIS — R262 Difficulty in walking, not elsewhere classified: Secondary | ICD-10-CM | POA: Diagnosis not present

## 2018-09-08 DIAGNOSIS — R2681 Unsteadiness on feet: Secondary | ICD-10-CM | POA: Diagnosis not present

## 2018-09-10 DIAGNOSIS — R2681 Unsteadiness on feet: Secondary | ICD-10-CM | POA: Diagnosis not present

## 2018-09-10 DIAGNOSIS — M6281 Muscle weakness (generalized): Secondary | ICD-10-CM | POA: Diagnosis not present

## 2018-09-10 DIAGNOSIS — R262 Difficulty in walking, not elsewhere classified: Secondary | ICD-10-CM | POA: Diagnosis not present

## 2018-09-11 DIAGNOSIS — M6281 Muscle weakness (generalized): Secondary | ICD-10-CM | POA: Diagnosis not present

## 2018-09-11 DIAGNOSIS — R262 Difficulty in walking, not elsewhere classified: Secondary | ICD-10-CM | POA: Diagnosis not present

## 2018-09-11 DIAGNOSIS — R2681 Unsteadiness on feet: Secondary | ICD-10-CM | POA: Diagnosis not present

## 2018-09-16 DIAGNOSIS — R262 Difficulty in walking, not elsewhere classified: Secondary | ICD-10-CM | POA: Diagnosis not present

## 2018-09-16 DIAGNOSIS — M6281 Muscle weakness (generalized): Secondary | ICD-10-CM | POA: Diagnosis not present

## 2018-09-16 DIAGNOSIS — R2681 Unsteadiness on feet: Secondary | ICD-10-CM | POA: Diagnosis not present

## 2018-09-17 DIAGNOSIS — R262 Difficulty in walking, not elsewhere classified: Secondary | ICD-10-CM | POA: Diagnosis not present

## 2018-09-17 DIAGNOSIS — M6281 Muscle weakness (generalized): Secondary | ICD-10-CM | POA: Diagnosis not present

## 2018-09-17 DIAGNOSIS — R2681 Unsteadiness on feet: Secondary | ICD-10-CM | POA: Diagnosis not present

## 2018-09-22 DIAGNOSIS — M6281 Muscle weakness (generalized): Secondary | ICD-10-CM | POA: Diagnosis not present

## 2018-09-22 DIAGNOSIS — R2681 Unsteadiness on feet: Secondary | ICD-10-CM | POA: Diagnosis not present

## 2018-09-22 DIAGNOSIS — R262 Difficulty in walking, not elsewhere classified: Secondary | ICD-10-CM | POA: Diagnosis not present

## 2018-09-24 DIAGNOSIS — M6281 Muscle weakness (generalized): Secondary | ICD-10-CM | POA: Diagnosis not present

## 2018-09-24 DIAGNOSIS — R262 Difficulty in walking, not elsewhere classified: Secondary | ICD-10-CM | POA: Diagnosis not present

## 2018-09-24 DIAGNOSIS — R2681 Unsteadiness on feet: Secondary | ICD-10-CM | POA: Diagnosis not present

## 2018-09-29 DIAGNOSIS — R262 Difficulty in walking, not elsewhere classified: Secondary | ICD-10-CM | POA: Diagnosis not present

## 2018-09-29 DIAGNOSIS — M6281 Muscle weakness (generalized): Secondary | ICD-10-CM | POA: Diagnosis not present

## 2018-09-29 DIAGNOSIS — R2681 Unsteadiness on feet: Secondary | ICD-10-CM | POA: Diagnosis not present

## 2018-10-03 DIAGNOSIS — R2681 Unsteadiness on feet: Secondary | ICD-10-CM | POA: Diagnosis not present

## 2018-10-03 DIAGNOSIS — M6281 Muscle weakness (generalized): Secondary | ICD-10-CM | POA: Diagnosis not present

## 2018-10-03 DIAGNOSIS — R262 Difficulty in walking, not elsewhere classified: Secondary | ICD-10-CM | POA: Diagnosis not present

## 2018-10-06 DIAGNOSIS — R2681 Unsteadiness on feet: Secondary | ICD-10-CM | POA: Diagnosis not present

## 2018-10-06 DIAGNOSIS — R262 Difficulty in walking, not elsewhere classified: Secondary | ICD-10-CM | POA: Diagnosis not present

## 2018-10-06 DIAGNOSIS — M6281 Muscle weakness (generalized): Secondary | ICD-10-CM | POA: Diagnosis not present

## 2018-10-10 DIAGNOSIS — R262 Difficulty in walking, not elsewhere classified: Secondary | ICD-10-CM | POA: Diagnosis not present

## 2018-10-10 DIAGNOSIS — M6281 Muscle weakness (generalized): Secondary | ICD-10-CM | POA: Diagnosis not present

## 2018-10-10 DIAGNOSIS — R2681 Unsteadiness on feet: Secondary | ICD-10-CM | POA: Diagnosis not present

## 2018-10-15 DIAGNOSIS — M6281 Muscle weakness (generalized): Secondary | ICD-10-CM | POA: Diagnosis not present

## 2018-10-15 DIAGNOSIS — R262 Difficulty in walking, not elsewhere classified: Secondary | ICD-10-CM | POA: Diagnosis not present

## 2018-10-15 DIAGNOSIS — R2681 Unsteadiness on feet: Secondary | ICD-10-CM | POA: Diagnosis not present

## 2018-10-17 DIAGNOSIS — M6281 Muscle weakness (generalized): Secondary | ICD-10-CM | POA: Diagnosis not present

## 2018-10-17 DIAGNOSIS — R262 Difficulty in walking, not elsewhere classified: Secondary | ICD-10-CM | POA: Diagnosis not present

## 2018-10-17 DIAGNOSIS — R2681 Unsteadiness on feet: Secondary | ICD-10-CM | POA: Diagnosis not present

## 2018-10-20 DIAGNOSIS — R2681 Unsteadiness on feet: Secondary | ICD-10-CM | POA: Diagnosis not present

## 2018-10-20 DIAGNOSIS — R262 Difficulty in walking, not elsewhere classified: Secondary | ICD-10-CM | POA: Diagnosis not present

## 2018-10-20 DIAGNOSIS — M6281 Muscle weakness (generalized): Secondary | ICD-10-CM | POA: Diagnosis not present

## 2018-10-22 DIAGNOSIS — I10 Essential (primary) hypertension: Secondary | ICD-10-CM | POA: Diagnosis not present

## 2018-10-22 DIAGNOSIS — M6281 Muscle weakness (generalized): Secondary | ICD-10-CM | POA: Diagnosis not present

## 2018-10-22 DIAGNOSIS — R2681 Unsteadiness on feet: Secondary | ICD-10-CM | POA: Diagnosis not present

## 2018-10-22 DIAGNOSIS — R262 Difficulty in walking, not elsewhere classified: Secondary | ICD-10-CM | POA: Diagnosis not present

## 2018-10-22 DIAGNOSIS — Z1389 Encounter for screening for other disorder: Secondary | ICD-10-CM | POA: Diagnosis not present

## 2018-10-22 DIAGNOSIS — L309 Dermatitis, unspecified: Secondary | ICD-10-CM | POA: Diagnosis not present

## 2018-10-22 DIAGNOSIS — J019 Acute sinusitis, unspecified: Secondary | ICD-10-CM | POA: Diagnosis not present

## 2018-10-22 DIAGNOSIS — N342 Other urethritis: Secondary | ICD-10-CM | POA: Diagnosis not present

## 2018-10-22 DIAGNOSIS — Z6822 Body mass index (BMI) 22.0-22.9, adult: Secondary | ICD-10-CM | POA: Diagnosis not present

## 2018-10-27 DIAGNOSIS — M6281 Muscle weakness (generalized): Secondary | ICD-10-CM | POA: Diagnosis not present

## 2018-10-27 DIAGNOSIS — R2681 Unsteadiness on feet: Secondary | ICD-10-CM | POA: Diagnosis not present

## 2018-10-27 DIAGNOSIS — R262 Difficulty in walking, not elsewhere classified: Secondary | ICD-10-CM | POA: Diagnosis not present

## 2018-10-28 ENCOUNTER — Ambulatory Visit (INDEPENDENT_AMBULATORY_CARE_PROVIDER_SITE_OTHER): Payer: Medicare Other | Admitting: Neurology

## 2018-10-28 ENCOUNTER — Encounter: Payer: Self-pay | Admitting: Neurology

## 2018-10-28 ENCOUNTER — Other Ambulatory Visit: Payer: Self-pay | Admitting: Neurology

## 2018-10-28 VITALS — BP 128/76 | HR 87 | Ht 66.0 in | Wt 136.0 lb

## 2018-10-28 DIAGNOSIS — G459 Transient cerebral ischemic attack, unspecified: Secondary | ICD-10-CM | POA: Diagnosis not present

## 2018-10-28 DIAGNOSIS — G3184 Mild cognitive impairment, so stated: Secondary | ICD-10-CM | POA: Diagnosis not present

## 2018-10-28 DIAGNOSIS — G9341 Metabolic encephalopathy: Secondary | ICD-10-CM

## 2018-10-28 DIAGNOSIS — E871 Hypo-osmolality and hyponatremia: Secondary | ICD-10-CM

## 2018-10-28 DIAGNOSIS — R410 Disorientation, unspecified: Secondary | ICD-10-CM | POA: Diagnosis not present

## 2018-10-28 DIAGNOSIS — R296 Repeated falls: Secondary | ICD-10-CM | POA: Insufficient documentation

## 2018-10-28 NOTE — Progress Notes (Signed)
Provider:  Larey West, M D  Referring Provider: Sharilyn Sites, MD Primary Care Physician:  Claire Sites, MD  Chief Complaint  Patient presents with  . Follow-up    MEtabolic encephalopathy and memory follow up room 11 pt with Claire West her son- RN Claire West    HPI:  Claire West is a 78 y.o. female patient in RV with caretaker and sister, having fallen many time. Her caretaker has noted that she falls after turning head or body- off balance. She falls over the walker, with the cane.  I was able today to see a photograph on I asked Claire West when she had fallen and she had quite a significant goose egg on her right forehead, bruising over the face, she seems to tend to fall straight forward.  I was also made aware that she had a previous admission to hospital on 15 November when she had suddenly felt hot she had been shopping during the day was in a department store as a family went home she developed slurred speech and seemed not quite oriented to her surroundings, her son called EMS and she was admitted to [facility] from there transferred to [facility] for an MRI.  She had a normal urine analysis is very little nitrate, I doubt that she had a UTI at the time.  She had hypertension and she was given hydralazine as her blood pressure exceeded 160/100.  She was continued on Toprol and Diovan.  She was also given Lovenox as a DVT prophylaxis.  Again the patient was transferred after observation.  To [facility] where an MRI of the brain was obtained which showed thank goodness no acute strokes or intracranial bleed.  The neuro hospitalist Dr. Virgina Norfolk evaluated her up and at the time of his examination her extremities moved with normal strength and coordination bilateral pupils equally responsive, she had a symmetric smile he did not find any dysarthria.  Labs showed high triglycerides, overall cholesterol normal limits, she was also having a sodium level of 134 glucose  level of 117 but she had not been fast.  White blood cell count was 6.0 no indication of infection.  CT of the head stable focal right frontal white matter hypodensity encephalomalacia, age-related involutional state changes.  No hypodensity, atherosclerosis of the sixth carotid siphons were seen.  Also atherosclerosis of both vertebral arteries noted.  MRI brain moderate intracranial atherosclerosis.  Severe stenosis of the left V4 and left P2 segments no emergent large vessel occlusion.  Chest x-ray no acute pulmonary process and mild cardiomegaly.     RV with son and caretaker CNA, 07-07-2018. I had the pleasure of seeing this patient on December 24, 2017 the day before her 26 first day.  See history below.  We held off after our last visit on starting any kind of memory supportive medication as I was not sure that the patient has a ongoing chronic metabolic encephalopathy or rather a delirium but had something to do with the medication and conditions she was treated in the hospital for.  She also had acute electrolyte imbalances which can cause confusion memory loss and sometimes even pseudo-psychotic episodes.  Today she performed well on a Mini-Mental Status Examination she was fully oriented to [facility] [place] and state, she knew it was 58 September but not sure about the day of the week.  She was able to spell a word backwards which gave her 4 out of  5 possible points she recalled 3 immediate recall words, she follows all commands fluently and performed also verbal and written commands.  Visual-spatial orientation was intact. She has daytime caregivers, urine incontinence is a problem, and she forgets medications ,meals, but she is not longer in delirium. .   Inspite of this MMSE was 27/30 points today! Her husband is in memory care, her brother in law had frontal dementia, a sister in law with lymphoma.   She has undergone a knee replacement and has undergone exercises and PT concluded  just last week , walks with a walker. She feels unsteady- She has a swollen left leg, with a hematoma under the surgical knee site.   MMSE - Mini Mental State Exam 07/07/2018 12/24/2017  Orientation to time 4 4  Orientation to Place 5 4  Registration 3 3  Attention/ Calculation 3 4  Recall 3 2  Language- name 2 objects 2 1  Language- repeat 1 1  Language- follow 3 step command 3 3  Language- read & follow direction 1 1  Write a sentence 1 1  Copy design 1 0  Total score 27 24                                                              28/ 30 - I used WORLD backwards instead of serial 7s.   I added a MOCA  21/ 30 points- this is indicative of early dementia.     HRI :Consult for this 78 year old female patient who is seen here as a referral from Dr. Hilma Favors for follow up on a metabolic encephalopathy late January and early February 2019, and earlier an episode in November 2018. shehas a history of HTN, osteoarthritis and 2 strokes.  Neurology  I have the pleasure of seeing Claire West  on 24 December [birth year], 77 year old Caucasian right-handed female patient of Dr. Hilma Favors presents after a hospital admission between January 30 and November 08, 2017 when she was diagnosed with acute metabolic encephalopathy hyponatremia, hypokalemia.  It was assumed that blood pressure medications which she has taken for over a decade had caused the electrolyte imbalance.  There were also previous episodes of low potassium in late Ultram 2018 noticed.  I would like to "her current medications which include aspirin, Citrucel, multivitamins, calcium, tramadol, oxybutynin chloride, omeprazole, meloxicam, escitalopram, losartan, hydrochlorothiazide and metoprolol.  She no longer takes meloxicam, tramadol , oxybutynin. She feels better but she still has sundowning, asks more repetitive questions to wards the evening, more forgetful.   She is often frustrated and her family is concerned about her driving. The patient's  Blood pressure control is very important to slow the progression of neuro micro-vascular disease.  I will revisit with her in 4 month and offered in the meanwhile start her on Namenda , with the goal to start aricept as addition. She is at this time inclined to wait, have repeat memory testing and would than decide in 4 month to start medication or not.      Review of Systems: Out of a complete 14 system review, the patient complains of only the following symptoms, and all other reviewed systems are negative.  confusion, repetitive questions, walking unsteadiness. Cane - knee is "Bone on bone"   Social History  Socioeconomic History  . Marital status: Married    Spouse name: Not on file  . Number of children: Not on file  . Years of education: Not on file  . Highest education level: Not on file  Occupational History  . Not on file  Social Needs  . Financial resource strain: Not on file  . Food insecurity:    Worry: Not on file    Inability: Not on file  . Transportation needs:    Medical: Not on file    Non-medical: Not on file  Tobacco Use  . Smoking status: Never Smoker  . Smokeless tobacco: Never Used  Substance and Sexual Activity  . Alcohol use: No  . Drug use: No  . Sexual activity: Never  Lifestyle  . Physical activity:    Days per week: Not on file    Minutes per session: Not on file  . Stress: Not on file  Relationships  . Social connections:    Talks on phone: Not on file    Gets together: Not on file    Attends religious service: Not on file    Active member of club or organization: Not on file    Attends meetings of clubs or organizations: Not on file    Relationship status: Not on file  . Intimate partner violence:    Fear of current or ex partner: Not on file    Emotionally abused: Not on file    Physically abused: Not on file    Forced sexual activity: Not on file  Other Topics Concern  . Not on file  Social History Narrative  . Not on file     Family History  Problem Relation Age of Onset  . Lung cancer Mother   . Lung cancer Sister     Past Medical History:  Diagnosis Date  . Depression   . Gait instability    walks with cane  . GERD (gastroesophageal reflux disease)   . Hyperlipemia   . Hypertension   . Knee pain   . Mild cognitive impairment with memory loss   . Seasonal allergies     Past Surgical History:  Procedure Laterality Date  . ABDOMINAL HYSTERECTOMY    . ARTHROSCOPIC REPAIR ACL    . COLONOSCOPY    . COLONOSCOPY N/A 11/23/2015   Procedure: COLONOSCOPY;  Surgeon: Rogene Houston, MD;  Location: [facility];  Service: Endoscopy;  Laterality: N/A;  830  . EYE SURGERY Bilateral    Cataracts  . TOTAL KNEE ARTHROPLASTY Left 05/05/2018   Procedure: LEFT TOTAL KNEE ARTHROPLASTY;  Surgeon: Gaynelle Arabian, MD;  Location: [facility];  Service: Orthopedics;  Laterality: Left;  with block    Current Outpatient Medications  Medication Sig Dispense Refill  . acetaminophen (TYLENOL) 500 MG tablet Take 500 mg by mouth daily as needed for mild pain.    Marland Kitchen amLODipine (NORVASC) 5 MG tablet Take 5 mg by mouth daily.    . cholecalciferol (VITAMIN D) 1000 units tablet Take 1,000 Units by mouth daily at 12 noon.    . escitalopram (LEXAPRO) 10 MG tablet Take 10 mg by mouth at bedtime.  1  . fexofenadine (ALLEGRA) 180 MG tablet Take 180 mg by mouth daily.    Javier Docker Oil 500 MG CAPS Take 500 mg by mouth daily at 12 noon.    Marland Kitchen levofloxacin (LEVAQUIN) 500 MG tablet Take 500 mg by mouth daily.    . metoprolol succinate (TOPROL-XL) 100 MG 24 hr tablet  Take 100 mg by mouth 2 (two) times daily. Take with or immediately following a meal.    . Multiple Vitamin (MULTIVITAMIN WITH MINERALS) TABS tablet Take 1 tablet by mouth daily. Women's Multivitamin    . omeprazole (PRILOSEC) 20 MG capsule Take 20 mg by mouth daily before breakfast.     . potassium chloride (K-DUR) 10 MEQ tablet Take 1 tablet (10 mEq total) by mouth daily. (Patient  taking differently: Take 10 mEq by mouth daily at 12 noon. ) 5 tablet 0  . Probiotic Product (PROBIOTIC PO) Take 1 capsule by mouth daily. [place]    . valsartan (DIOVAN) 320 MG tablet Take 320 mg by mouth daily at 3 pm.     . pravastatin (PRAVACHOL) 20 MG tablet Take 1 tablet (20 mg total) by mouth daily at 6 PM. 30 tablet 0   No current facility-administered medications for this visit.     Allergies as of 10/28/2018 - Review Complete 10/28/2018  Allergen Reaction Noted  . Adhesive [tape] Itching and Rash 08/22/2018  . Codeine Hives and Rash 07/08/2012  . Neosporin [neomycin-bacitracin zn-polymyx] Rash 04/23/2018  . Penicillins Rash 07/08/2012  . Sulfa antibiotics Hives and Rash 07/08/2012     CLINICAL DATA:  Initial evaluation for acute altered mental status, confusion, lethargy.  EXAM: MRI HEAD WITHOUT CONTRAST  TECHNIQUE: Multiplanar, multiecho pulse sequences of the brain and surrounding structures were obtained without intravenous contrast.  COMPARISON:  Prior CT from 11/04/2017.  FINDINGS: Brain: Generalized age-related cerebral atrophy. Extensive T2/FLAIR hyperintensity involving the periventricular and deep white matter both cerebral hemispheres, most consistent with chronic microvascular ischemic changes. Chronic microvascular disease present within the pons as well. Superimposed remote lacunar infarcts present within the bilateral basal ganglia, deep white matter, and right thalamus.  No abnormal foci of restricted diffusion to suggest acute or subacute ischemia. Gray-white matter differentiation maintained. No evidence for remote cortical infarction. No acute intracranial hemorrhage. Small chronic microhemorrhage noted within the right thalamus.  No mass lesion, midline shift or mass effect. No hydrocephalus. No extra-axial fluid collection. Major dural sinuses are grossly patent.  Pituitary gland suprasellar region normal. Midline structures  intact and normal.  Vascular: Major intracranial vascular flow voids are maintained at the skull base.  Skull and upper cervical spine: Craniocervical junction within normal limits. Mild degenerate spondylolysis noted within the upper cervical spine without significant stenosis. Bone marrow signal intensity within normal limits. No scalp soft tissue abnormality.  Sinuses/Orbits: Globes and orbital soft tissues within normal limits. Patient status post cataract extraction bilaterally. Scattered mucosal thickening within the ethmoidal air cells and maxillary sinuses. Paranasal sinuses are otherwise clear. No significant mastoid effusion. Inner ear structures grossly normal.  Other: None.  IMPRESSION: 1. No acute intracranial abnormality. 2. Generalized age-related cerebral atrophy with advanced chronic microvascular ischemic disease with scattered remote lacunar infarcts as above.   Electronically Signed   By: Jeannine Boga M.D.   On: 11/06/2017 20:52    Vitals: BP 128/76   Pulse 87   Ht 5\' 6"  (1.676 m)   Wt 136 lb (61.7 kg)   BMI 21.95 kg/m  Last Weight:  Wt Readings from Last 1 Encounters:  10/28/18 136 lb (61.7 kg)   Last Height:   Ht Readings from Last 1 Encounters:  10/28/18 5\' 6"  (1.676 m)    Physical exam:  General: The patient is not fully  alert and appears not in acute distress. The patient is well groomed. Head: Normocephalic, atraumatic. Neck is supple. Mallampati  1, neck circumference:14- Cardiovascular:  Regular rate and rhythm , without  murmurs or carotid bruit, and without distended neck veins. Respiratory: Lungs are clear to auscultation. Skin:  Without evidence of edema, or rash Trunk: stooped posture. Leaning to the right side.   Neurologic exam :   Memory subjective described as ' slow " There is a normal attention span & concentration ability-" small talk". Speech is fluent without dysarthria, dysphonia or aphasia. Mood and  affect are appropriate.  MMSE - Mini Mental State Exam 07/07/2018 12/24/2017  Orientation to time 4 4  Orientation to Place 5 4  Registration 3 3  Attention/ Calculation 3 4  Recall 3 2  Language- name 2 objects 2 1  Language- repeat 1 1  Language- follow 3 step command 3 3  Language- read & follow direction 1 1  Write a sentence 1 1  Copy design 1 0  Total score 27 24   I added MOCA testing. The patient was well able to solve trail making and clock drawing, and named 2 of 3 exotic animals .She failed at copying a cube.  Cranial nerves: Pupils are equal and briskly reactive to light. Extraocular movements  in vertical and horizontal planes intact and without nystagmus.  Visual fields by finger perimetry are intact. Hearing to finger rub intact.  Facial sensation intact to fine touch. Facial motor strength is symmetric and tongue and uvula move midline. Tongue protrusion into either cheek is normal. Shoulder shrug is normal.   Motor exam:   Normal tone ,muscle bulk and symmetric strength in all extremities. Her knee buckles- she is unsteady . But she has good strength.   Sensory:  Fine touch, pinprick and vibration were tested in all extremities. Proprioception was abnormal  Coordination: Rapid alternating movements in the fingers/hands were normal.  Finger-to-nose maneuver  normal without evidence of ataxia, dysmetria or tremor.  Gait and station: Patient walks with a cane for  assistive device. Strength within normal limits.  Smaller step width.  Stance is wider based - she turned with 5 steps and used her cane. No hand tremor noticed, normal arm swing.  I worked with the patient and held her right hand was evident that she is leaning towards the right side, and when she turned her turns were very fragmented she is taking very small steps more than 4 return 180 degrees, I had her also turn her head left and right which did not lead to a fall but her gait slowed which tells me that she  felt insecure or at least off balance and lightheaded. Deep tendon reflexes: in the  upper and lower extremities are symmetric 2 plus - and intact.  Babinski maneuver response is downgoing.    Assessment:  Dear Dr. Hilma Favors,  After physical and neurologic examination, review of laboratory studies, imaging, neurophysiology testing and pre-existing records, assessment of our mutual patient is  that of :   Advanced microvascular diease and arthrosclerosis, larger vessels such as carotid syphons and vertebral arteries were affected, explaining the fall tendency after a head turn.  She had a TIA in November- back to normal in 60 minutes- not described as a likely seizure activity.   Dementia cannot be ruled out, she doesn't even have enough point loss for MCI on MMSE but clearly had not the stamina to succeed on a MOCA  ( 21 /30 ) points. -   there is advanced atrophy with widened temporal sulci by brain MRI. Episodic loss of memory,  amnestic spells. I woud use early dementia as a diagnosis.   Gait disorder related to advanced arthritis, knee replacement. She resumed PT.    Plan:  Treatment plan and additional workup :  NPI :  I will need to have her evaluated for vertebrobasilar insufficiency, carotid and vertebral arthrosclerosis.  Another appointment in  4 month with MOCA/ MMSE .  Asencion Partridge [place] MD 10/28/2018

## 2018-10-31 ENCOUNTER — Ambulatory Visit (HOSPITAL_COMMUNITY)
Admission: RE | Admit: 2018-10-31 | Discharge: 2018-10-31 | Disposition: A | Discharge status: Home / Self Care | Payer: Medicare Other | Source: Ambulatory Visit | Attending: Neurology | Admitting: Neurology

## 2018-10-31 DIAGNOSIS — R296 Repeated falls: Secondary | ICD-10-CM | POA: Diagnosis not present

## 2018-10-31 DIAGNOSIS — G3184 Mild cognitive impairment, so stated: Secondary | ICD-10-CM

## 2018-10-31 DIAGNOSIS — E871 Hypo-osmolality and hyponatremia: Secondary | ICD-10-CM | POA: Diagnosis not present

## 2018-10-31 DIAGNOSIS — G459 Transient cerebral ischemic attack, unspecified: Secondary | ICD-10-CM | POA: Diagnosis not present

## 2018-10-31 DIAGNOSIS — R410 Disorientation, unspecified: Secondary | ICD-10-CM

## 2018-10-31 DIAGNOSIS — G9341 Metabolic encephalopathy: Secondary | ICD-10-CM

## 2018-10-31 NOTE — Progress Notes (Signed)
TCD completed. Refer to "CV Proc" under chart review to view preliminary results.  10/31/2018 2:32 PM Maudry Mayhew, MHA, RVT, RDCS, RDMS

## 2018-11-03 DIAGNOSIS — M6281 Muscle weakness (generalized): Secondary | ICD-10-CM | POA: Diagnosis not present

## 2018-11-03 DIAGNOSIS — R262 Difficulty in walking, not elsewhere classified: Secondary | ICD-10-CM | POA: Diagnosis not present

## 2018-11-03 DIAGNOSIS — R2681 Unsteadiness on feet: Secondary | ICD-10-CM | POA: Diagnosis not present

## 2018-11-04 ENCOUNTER — Telehealth: Payer: Self-pay | Admitting: Neurology

## 2018-11-04 NOTE — Telephone Encounter (Signed)
-----   Message from Larey Seat, MD sent at 11/03/2018  3:59 PM EST ----- Cc Zollie Scale, [place].   Diffuse arthrosclerotic changes, affecting both vertebral and several Intra-cranial vessels. Will not change medication ! CD

## 2018-11-04 NOTE — Telephone Encounter (Signed)
Called the patient and she was asleep, the caregiver asked me to call the patient's sister so I did. I called and reviewed the results with her sister informing her to keep control over her BP, cholesterol and glucose levels. Patient sister verbalized results. She asked if the patient needed to be on plavix, advised that per Dr Dohmeier recommendations we would not need to continue with the plavix. She verbalized understanding.

## 2018-11-12 DIAGNOSIS — M1711 Unilateral primary osteoarthritis, right knee: Secondary | ICD-10-CM | POA: Diagnosis not present

## 2018-11-12 DIAGNOSIS — M170 Bilateral primary osteoarthritis of knee: Secondary | ICD-10-CM | POA: Diagnosis not present

## 2018-11-17 DIAGNOSIS — M2011 Hallux valgus (acquired), right foot: Secondary | ICD-10-CM | POA: Diagnosis not present

## 2018-11-17 DIAGNOSIS — M79674 Pain in right toe(s): Secondary | ICD-10-CM | POA: Diagnosis not present

## 2018-11-17 DIAGNOSIS — M79671 Pain in right foot: Secondary | ICD-10-CM | POA: Diagnosis not present

## 2018-11-17 DIAGNOSIS — M2041 Other hammer toe(s) (acquired), right foot: Secondary | ICD-10-CM | POA: Diagnosis not present

## 2018-11-19 DIAGNOSIS — R350 Frequency of micturition: Secondary | ICD-10-CM | POA: Diagnosis not present

## 2018-11-19 DIAGNOSIS — I10 Essential (primary) hypertension: Secondary | ICD-10-CM | POA: Diagnosis not present

## 2018-11-19 DIAGNOSIS — Z6822 Body mass index (BMI) 22.0-22.9, adult: Secondary | ICD-10-CM | POA: Diagnosis not present

## 2018-12-15 DIAGNOSIS — M205X1 Other deformities of toe(s) (acquired), right foot: Secondary | ICD-10-CM | POA: Diagnosis not present

## 2018-12-15 DIAGNOSIS — M79671 Pain in right foot: Secondary | ICD-10-CM | POA: Diagnosis not present

## 2018-12-15 DIAGNOSIS — M205X9 Other deformities of toe(s) (acquired), unspecified foot: Secondary | ICD-10-CM | POA: Insufficient documentation

## 2018-12-15 DIAGNOSIS — M21611 Bunion of right foot: Secondary | ICD-10-CM | POA: Diagnosis not present

## 2018-12-17 DIAGNOSIS — M1711 Unilateral primary osteoarthritis, right knee: Secondary | ICD-10-CM | POA: Diagnosis not present

## 2018-12-24 DIAGNOSIS — M1711 Unilateral primary osteoarthritis, right knee: Secondary | ICD-10-CM | POA: Diagnosis not present

## 2018-12-29 DIAGNOSIS — M25511 Pain in right shoulder: Secondary | ICD-10-CM | POA: Diagnosis not present

## 2018-12-29 DIAGNOSIS — M7541 Impingement syndrome of right shoulder: Secondary | ICD-10-CM | POA: Diagnosis not present

## 2018-12-31 DIAGNOSIS — M25561 Pain in right knee: Secondary | ICD-10-CM | POA: Diagnosis not present

## 2018-12-31 DIAGNOSIS — M1711 Unilateral primary osteoarthritis, right knee: Secondary | ICD-10-CM | POA: Diagnosis not present

## 2019-01-01 DIAGNOSIS — N342 Other urethritis: Secondary | ICD-10-CM | POA: Diagnosis not present

## 2019-01-01 DIAGNOSIS — Z681 Body mass index (BMI) 19 or less, adult: Secondary | ICD-10-CM | POA: Diagnosis not present

## 2019-01-01 DIAGNOSIS — N390 Urinary tract infection, site not specified: Secondary | ICD-10-CM | POA: Diagnosis not present

## 2019-01-05 ENCOUNTER — Ambulatory Visit: Payer: Medicare Other | Admitting: Neurology

## 2019-02-05 DIAGNOSIS — M7541 Impingement syndrome of right shoulder: Secondary | ICD-10-CM | POA: Insufficient documentation

## 2019-02-09 DIAGNOSIS — M7541 Impingement syndrome of right shoulder: Secondary | ICD-10-CM | POA: Diagnosis not present

## 2019-02-10 ENCOUNTER — Encounter: Payer: Self-pay | Admitting: Neurology

## 2019-02-10 ENCOUNTER — Telehealth: Payer: Self-pay | Admitting: Neurology

## 2019-02-10 NOTE — Telephone Encounter (Signed)
Called the patient to review their chart and made sure that everything was up to date. Patient informed they do have e-mail for the visit and I also sent a text since the visit is to be completed on a smart phone. Instructed to make sure they hold on to the e-mail for the upcoming appointment as it is necessary to access their appointment. Instructed the patient that apx 30 min prior to the appointment the front staff will contact them to make sure they are ready to go for their appointment in case there is any need for troubleshooting it can be completed prior to the appointment time. Pt verbalized understanding.

## 2019-02-11 ENCOUNTER — Encounter: Payer: Self-pay | Admitting: Neurology

## 2019-02-11 ENCOUNTER — Other Ambulatory Visit: Payer: Self-pay

## 2019-02-11 ENCOUNTER — Ambulatory Visit (INDEPENDENT_AMBULATORY_CARE_PROVIDER_SITE_OTHER): Payer: Medicare Other | Admitting: Neurology

## 2019-02-11 DIAGNOSIS — G9341 Metabolic encephalopathy: Secondary | ICD-10-CM | POA: Diagnosis not present

## 2019-02-11 DIAGNOSIS — G3184 Mild cognitive impairment, so stated: Secondary | ICD-10-CM | POA: Diagnosis not present

## 2019-02-11 DIAGNOSIS — R410 Disorientation, unspecified: Secondary | ICD-10-CM | POA: Diagnosis not present

## 2019-02-11 NOTE — Patient Instructions (Signed)
Delirium  Delirium is a state of mental confusion. It comes on quickly and causes significant changes in a person's thinking and behavior. People with delirium usually have trouble paying attention to what is going on or knowing where they are. They may become very withdrawn or very emotional and unable to sit still. They may even see or feel things that are not there (hallucinations). Delirium is a sign of a serious underlying medical condition.  What are the causes?  Delirium occurs when something suddenly affects the signals that the brain sends out. Brain signals can be affected by anything that puts severe stress on the body and brain and causes brain chemicals to be out of balance. The most common causes of delirium include:  Infections. These may be bacterial, viral, fungal, or protozoal.  Medicines. These include many over-the-counter and prescription medicines.  Recreational drugs.  Substance withdrawal. This occurs with sudden discontinuation of alcohol, certain medicines, or recreational drugs.  Surgery.  Sudden vascular events, such as stroke, brain hemorrhage, and severe migraine.  Other brain disorders, such as tumors, seizures, and physical head trauma.  Metabolic disorders, such as kidney or liver failure.  Low blood oxygen (anoxia). This may occur with lung disease, cardiac arrest, or carbon monoxide poisoning.  Hormone imbalances (endocrinopathies), such as an overactive thyroid (hyperthyroidism) or underactive thyroid (hypothyroidism).  Vitamin deficiencies.  What increases the risk?  This condition is more likely to develop in:  Children.  Older people.  People who live alone.  People who have vision loss or hearing loss.  People who have existing brain disease, such as dementia.  People who have long-lasting (chronic) medical conditions, such as heart disease.  People who are hospitalized for long periods of time.  What are the signs or symptoms?  Delirium starts with a sudden change in a  person's thinking or behavior. Symptoms come and go (fluctuate) over time, and they are often worse at the end of the day. Symptoms include:  Not being able to stay awake (drowsiness) or pay attention.  Being confused about places, time, and people.  Forgetfulness.  Having extreme energy levels. These may be low or high.  Changes in sleep patterns.  Extreme mood swings, such as anger or anxiety.  Focusing on things or ideas that are not important.  Rambling and senseless talking.  Difficulty speaking, understanding speech, or both.  Hallucinations.  Tremor or unsteady gait.  How is this diagnosed?  People with delirium may not realize that they have the condition. Often, a family member or health care provider is the first person to notice the changes. The health care provider will obtain a detailed history of current symptoms, medical issues, medicines, and recreational drug use. The health care provider will perform a mental status examination by:  Asking questions to check for confusion.  Watching for abnormal behavior.  The health care provider may perform a physical exam and order lab tests or additional studies to determine the cause of the delirium.  How is this treated?  Treatment of delirium depends on the cause and severity. Delirium usually goes away within days or weeks of treating the underlying cause. In the meantime, the person should not be left alone because he or she may accidentally cause self-harm. Treatment includes supportive care, such as:  Increased light during the day and decreased light at night.  Low noise level.  Uninterrupted sleep.  A regular daily schedule.  Clocks and calendars to help with orientation.  Familiar objects, including   the person's pictures and clothing.  Frequent visits from familiar family and friends.  Healthy diet.  Exercise.  In more severe cases of delirium, medicine may be prescribed to help the person to keep calm and think more clearly.  Follow these  instructions at home:  Any supportive care should be continued as told by the health care provider.  All medicines should be used as told by the health care provider. This is important.  The health care provider should be consulted before over-the-counter medicines, herbs, or supplements are used.  All follow-up visits should be kept as told by the health care provider. This is important.  Alcohol and recreational drugs should be avoided as told by the health care provider.  Contact a health care provider if:  Symptoms do not get better or they become worse.  New symptoms of delirium develop.  Caring for the person at home does not seem safe.  Eating, drinking, or communicating stops.  There are side effects of medicines, such as changes in sleep patterns, dizziness, weight gain, restlessness, movement changes, or tremors.  Get help right away if:  Serious thoughts occur about self-harm or about hurting others.  There are serious side effects of medicine, such as:  Swelling of the face, lips, tongue, or throat.  Fever, confusion, muscle spasms, or seizures.  This information is not intended to replace advice given to you by your health care provider. Make sure you discuss any questions you have with your health care provider.  Document Released: 06/18/2012 Document Revised: 03/01/2016 Document Reviewed: 11/17/2014  [employer] Patient Education  2019 [employer]

## 2019-02-11 NOTE — Progress Notes (Addendum)
Virtual Visit via Video Note  I connected with Claire West on 02/11/19 at  2:00 PM EDT by a video enabled telemedicine application and verified that I am speaking with the correct person using two identifiers.  Location: Patient: with her caretaker at the private residence,  Provider:at [facility]   I discussed the limitations of evaluation and management by telemedicine and the availability of in person appointments. The patient expressed understanding and agreed to proceed.  History of Present Illness: patient presented with impaired balance and frequent falls in January  2020.  She is doing better, walking with improved  Stability since January- no falls since January!  Physical therapy has helped. Finished the course.  All meds are the same.  Her voice is clear.  She reports no problem with memory or alertness. Her nursing aid is with her. She rises at 8, and have breakfast at 9 AM,  Walks each day outside with assistance- cane . Lunch time at 13 hours and naps after that. Nap is refreshing, not confused upon arousal.  Dinner time 6 PM and bedtime is 9.30 PM.  She spends 10 hours in bed, sleeps about 7-8 hours.  No vision loss, no hearing loss.      Observations/Objective:  video cut out, we performed  the rest over the phone. Mood is good, she likes to walk outside, and she gets along with caretaker.     Assessment and Plan:  Dementia with  MOCA  ( 21 /30 ) points.  There is advanced atrophy with widened temporal sulci by brain MRI. Episodic loss of memory, amnestic spells. I woud use early dementia as a diagnosis. No medication refills needed.    Follow Up Instructions:   RV in 6 month with NP for memory testing.  Dementia presents with confusion after exacerbation during UTI.  Last UTI started this week- on current ATB treatment .     I discussed the assessment and treatment plan with the patient. The patient was provided an opportunity to ask questions and all were  answered. The patient agreed with the plan and demonstrated an understanding of the instructions.   The patient was advised to call back or seek an in-person evaluation if the symptoms worsen or if the condition fails to improve as anticipated.  I provided 18 minutes of non-face-to-face time during this encounter. I discussed my assessment with Claire West's caretaker, Claire West ,who agreed that no new medications and no refills are needed. MMSE in 6 month with NP.    Claire Seat, MD    PS - I received a phone call from the family that things have much more progressed than the patient admitted or realized : RN ; "Called the sister back and reviewed with her the things that needed to be updated that they didn't want to discuss with potentially upsetting the pt.  Pt has been anxious more recently because her son is traveling to visit grandson who is leaving for [employer]. She states the pt usually goes to bed around 10-11 pm and then also wakes up about 10-11 am.  She does wake up through the night to pee. She recently had a fall out of the bed but the night time sitter was able to catch her by the waist so there was no injury with the fall. Sister informed that her balance is slightly better and she is using the cane but from recent rotator cuff repair she had therapy with her working on using the cane and  so that hindered balance progression.  She states if walking straight she is fine when she makes quick turns it throws her balance off.  Memory is still affected and seems it typically worsens in the evening. She states that she is often agitated if corrected.  She reviewed a few medications that needed to be updated that were initially given to me incorrectly and I have made the change on the patients chart.  Pt sister also would like me to send copy of notes from yesterdays apt to the patient's address on file. Advised I would so this.  Advised the sister I would send this phone note to Dr  Claire West as Juluis Rainier so she could review. Patient was appreciative".      Provider:  Larey West, M D  Referring Provider: Sharilyn Sites, MD Primary Care Physician:  Claire Sites, MD  Chief Complaint  Patient presents with   Follow-up    MEtabolic encephalopathy and memory follow up room 11 pt with Claire West her son- RN Claire West    HPI:  Claire West is a 78 y.o. female patient in RV with caretaker and sister, having fallen many time. Her caretaker has noted that she falls after turning head or body- off balance. She falls over the walker, with the cane.  I was able today to see a photograph on I asked Claire West when she had fallen and she had quite a significant goose egg on her right forehead, bruising over the face, she seems to tend to fall straight forward.  I was also made aware that she had a previous admission to hospital on 15 November when she had suddenly felt hot she had been shopping during the day was in a department store as a family went home she developed slurred speech and seemed not quite oriented to her surroundings, her son called EMS and she was admitted to [facility] from there transferred to [facility] for an MRI.  She had a normal urine analysis is very little nitrate, I doubt that she had a UTI at the time.  She had hypertension and she was given hydralazine as her blood pressure exceeded 160/100.  She was continued on Toprol and Diovan.  She was also given Lovenox as a DVT prophylaxis.  Again the patient was transferred after observation.  To [facility] where an MRI of the brain was obtained which showed thank goodness no acute strokes or intracranial bleed.  The neuro hospitalist Claire West evaluated her up and at the time of his examination her extremities moved with normal strength and coordination bilateral pupils equally responsive, she had a symmetric smile he did not find any dysarthria.  Labs showed high triglycerides, overall  cholesterol normal limits, she was also having a sodium level of 134 glucose level of 117 but she had not been fast.  White blood cell count was 6.0 no indication of infection.  CT of the head stable focal right frontal white matter hypodensity encephalomalacia, age-related involutional state changes.  No hypodensity, atherosclerosis of the sixth carotid siphons were seen.  Also atherosclerosis of both vertebral arteries noted.  MRI brain moderate intracranial atherosclerosis.  Severe stenosis of the left V4 and left P2 segments no emergent large vessel occlusion.  Chest x-ray no acute pulmonary process and mild cardiomegaly.     RV with son and caretaker CNA, 07-07-2018. I had the pleasure of seeing this patient on December 24, 2017 the day before her 13 first day.  See history below.  We held off after our last visit on starting any kind of memory supportive medication as I was not sure that the patient has a ongoing chronic metabolic encephalopathy or rather a delirium but had something to do with the medication and conditions she was treated in the hospital for.  She also had acute electrolyte imbalances which can cause confusion memory loss and sometimes even pseudo-psychotic episodes.  Today she performed well on a Mini-Mental Status Examination she was fully oriented to [facility] [place] and state, she knew it was 15 September but not sure about the day of the week.  She was able to spell a word backwards which gave her 4 out of 5 possible points she recalled 3 immediate recall words, she follows all commands fluently and performed also verbal and written commands.  Visual-spatial orientation was intact. She has daytime caregivers, urine incontinence is a problem, and she forgets medications ,meals, but she is not longer in delirium. .   Inspite of this MMSE was 27/30 points today! Her husband is in memory care, her brother in law had frontal dementia, a sister in law with lymphoma.   She  has undergone a knee replacement and has undergone exercises and PT concluded just last week , walks with a walker. She feels unsteady- She has a swollen left leg, with a hematoma under the surgical knee site.   MMSE - Mini Mental State Exam 07/07/2018 12/24/2017  Orientation to time 4 4  Orientation to Place 5 4  Registration 3 3  Attention/ Calculation 3 4  Recall 3 2  Language- name 2 objects 2 1  Language- repeat 1 1  Language- follow 3 step command 3 3  Language- read & follow direction 1 1  Write a sentence 1 1  Copy design 1 0  Total score 27 24                                                              28/ 30 - I used WORLD backwards instead of serial 7s.   I added a MOCA  21/ 30 points- this is indicative of early dementia.     HRI :Consult for this 78 year old female patient who is seen here as a referral from Dr. Hilma Favors for follow up on a metabolic encephalopathy late January and early February 2019, and earlier an episode in November 2018. shehas a history of HTN, osteoarthritis and 2 strokes.  Neurology  I have the pleasure of seeing Claire West  on 24 December 42[birth year], 78 year old Caucasian right-handed female patient of Dr. Hilma Favors presents after a hospital admission between January 30 and November 08, 2017 when she was diagnosed with acute metabolic encephalopathy hyponatremia, hypokalemia.  It was assumed that blood pressure medications which she has taken for over a decade had caused the electrolyte imbalance.  There were also previous episodes of low potassium in late Ultram 2018 noticed.  I would like to "her current medications which include aspirin, Citrucel, multivitamins, calcium, tramadol, oxybutynin chloride, omeprazole, meloxicam, escitalopram, losartan, hydrochlorothiazide and metoprolol.  She no longer takes meloxicam, tramadol , oxybutynin. She feels better but she still has sundowning, asks more repetitive questions to wards the evening, more forgetful.   She is  often frustrated  and her family is concerned about her driving. The patient's Blood pressure control is very important to slow the progression of neuro micro-vascular disease.  I will revisit with her in 4 month and offered in the meanwhile start her on Namenda , with the goal to start aricept as addition. She is at this time inclined to wait, have repeat memory testing and would than decide in 4 month to start medication or not.      Review of Systems: Out of a complete 14 system review, the patient complains of only the following symptoms, and all other reviewed systems are negative.  confusion, repetitive questions, walking unsteadiness. Cane - knee is "Bone on bone"   Social History   Socioeconomic History   Marital status: Married    Spouse name: Not on file   Number of children: Not on file   Years of education: Not on file   Highest education level: Not on file  Occupational History   Not on file  Social Needs   Financial resource strain: Not on file   Food insecurity:    Worry: Not on file    Inability: Not on file   Transportation needs:    Medical: Not on file    Non-medical: Not on file  Tobacco Use   Smoking status: Never Smoker   Smokeless tobacco: Never Used  Substance and Sexual Activity   Alcohol use: No   Drug use: No   Sexual activity: Never  Lifestyle   Physical activity:    Days per week: Not on file    Minutes per session: Not on file   Stress: Not on file  Relationships   Social connections:    Talks on phone: Not on file    Gets together: Not on file    Attends religious service: Not on file    Active member of club or organization: Not on file    Attends meetings of clubs or organizations: Not on file    Relationship status: Not on file   Intimate partner violence:    Fear of current or ex partner: Not on file    Emotionally abused: Not on file    Physically abused: Not on file    Forced sexual activity: Not on file  Other  Topics Concern   Not on file  Social History Narrative   Not on file    Family History  Problem Relation Age of Onset   Lung cancer Mother    Lung cancer Sister     Past Medical History:  Diagnosis Date   Depression    Gait instability    walks with cane   GERD (gastroesophageal reflux disease)    Hyperlipemia    Hypertension    Knee pain    Mild cognitive impairment with memory loss    Seasonal allergies     Past Surgical History:  Procedure Laterality Date   ABDOMINAL HYSTERECTOMY     ARTHROSCOPIC REPAIR ACL     COLONOSCOPY     COLONOSCOPY N/A 11/23/2015   Procedure: COLONOSCOPY;  Surgeon: Rogene Houston, MD;  Location: [facility];  Service: Endoscopy;  Laterality: N/A;  830   EYE SURGERY Bilateral    Cataracts   TOTAL KNEE ARTHROPLASTY Left 05/05/2018   Procedure: LEFT TOTAL KNEE ARTHROPLASTY;  Surgeon: Gaynelle Arabian, MD;  Location: [facility];  Service: Orthopedics;  Laterality: Left;  with block    Current Outpatient Medications  Medication Sig Dispense Refill   acetaminophen (TYLENOL)  500 MG tablet Take 500 mg by mouth daily as needed for mild pain.     amLODipine (NORVASC) 5 MG tablet Take 5 mg by mouth daily.     escitalopram (LEXAPRO) 10 MG tablet Take 10 mg by mouth at bedtime.  1   fexofenadine (ALLEGRA) 180 MG tablet Take 180 mg by mouth daily.     Krill Oil 500 MG CAPS Take 500 mg by mouth daily at 12 noon.     metoprolol succinate (TOPROL-XL) 100 MG 24 hr tablet Take 100 mg by mouth daily. Take with or immediately following a meal.      Multiple Vitamin (MULTIVITAMIN WITH MINERALS) TABS tablet Take 1 tablet by mouth daily. Women's Multivitamin     omeprazole (PRILOSEC) 20 MG capsule Take 20 mg by mouth daily before breakfast.      potassium chloride (K-DUR) 10 MEQ tablet Take 1 tablet (10 mEq total) by mouth daily. (Patient taking differently: Take 10 mEq by mouth daily at 12 noon. ) 5 tablet 0   pravastatin (PRAVACHOL) 40 MG  tablet Take 40 mg by mouth daily.     Probiotic Product (PROBIOTIC PO) Take 1 capsule by mouth daily. 25 Billion     No current facility-administered medications for this visit.     Allergies as of 02/11/2019 - Review Complete 02/10/2019  Allergen Reaction Noted   Adhesive [tape] Itching and Rash 08/22/2018   Codeine Hives and Rash 07/08/2012   Neosporin [neomycin-bacitracin zn-polymyx] Rash 04/23/2018   Penicillins Rash 07/08/2012   Sulfa antibiotics Hives and Rash 07/08/2012     CLINICAL DATA:  Initial evaluation for acute altered mental status, confusion, lethargy.  EXAM: MRI HEAD WITHOUT CONTRAST  TECHNIQUE: Multiplanar, multiecho pulse sequences of the brain and surrounding structures were obtained without intravenous contrast.  COMPARISON:  Prior CT from 11/04/2017.  FINDINGS: Brain: Generalized age-related cerebral atrophy. Extensive T2/FLAIR hyperintensity involving the periventricular and deep white matter both cerebral hemispheres, most consistent with chronic microvascular ischemic changes. Chronic microvascular disease present within the pons as well. Superimposed remote lacunar infarcts present within the bilateral basal ganglia, deep white matter, and right thalamus.  No abnormal foci of restricted diffusion to suggest acute or subacute ischemia. Gray-white matter differentiation maintained. No evidence for remote cortical infarction. No acute intracranial hemorrhage. Small chronic microhemorrhage noted within the right thalamus.  No mass lesion, midline shift or mass effect. No hydrocephalus. No extra-axial fluid collection. Major dural sinuses are grossly patent.  Pituitary gland suprasellar region normal. Midline structures intact and normal.  Vascular: Major intracranial vascular flow voids are maintained at the skull base.  Skull and upper cervical spine: Craniocervical junction within normal limits. Mild degenerate spondylolysis  noted within the upper cervical spine without significant stenosis. Bone marrow signal intensity within normal limits. No scalp soft tissue abnormality.  Sinuses/Orbits: Globes and orbital soft tissues within normal limits. Patient status post cataract extraction bilaterally. Scattered mucosal thickening within the ethmoidal air cells and maxillary sinuses. Paranasal sinuses are otherwise clear. No significant mastoid effusion. Inner ear structures grossly normal.  Other: None.  IMPRESSION: 1. No acute intracranial abnormality. 2. Generalized age-related cerebral atrophy with advanced chronic microvascular ischemic disease with scattered remote lacunar infarcts as above.   Electronically Signed   By: Claire West M.D.   On: 11/06/2017 20:52    Vitals: There were no vitals taken for this visit. Last Weight:  Wt Readings from Last 1 Encounters:  10/28/18 136 lb (61.7 kg)   Last Height:  Ht Readings from Last 1 Encounters:  10/28/18 5\' 6"  (1.676 m)    Physical exam:  General: The patient is not fully  alert and appears not in acute distress. The patient is well groomed. Head: Normocephalic, atraumatic. Neck is supple. Mallampati 1, neck circumference:14- Cardiovascular:  Regular rate and rhythm , without  murmurs or carotid bruit, and without distended neck veins. Respiratory: Lungs are clear to auscultation. Skin:  Without evidence of edema, or rash Trunk: stooped posture. Leaning to the right side.   Neurologic exam :   Memory subjective described as ' slow " There is a normal attention span & concentration ability-" small talk". Speech is fluent without dysarthria, dysphonia or aphasia. Mood and affect are appropriate.  MMSE - Mini Mental State Exam 07/07/2018 12/24/2017  Orientation to time 4 4  Orientation to Place 5 4  Registration 3 3  Attention/ Calculation 3 4  Recall 3 2  Language- name 2 objects 2 1  Language- repeat 1 1  Language- follow 3  step command 3 3  Language- read & follow direction 1 1  Write a sentence 1 1  Copy design 1 0  Total score 27 24   I added MOCA testing. The patient was well able to solve trail making and clock drawing, and named 2 of 3 exotic animals .She failed at copying a cube.  Cranial nerves: Pupils are equal and briskly reactive to light. Extraocular movements  in vertical and horizontal planes intact and without nystagmus.  Visual fields by finger perimetry are intact. Hearing to finger rub intact.  Facial sensation intact to fine touch. Facial motor strength is symmetric and tongue and uvula move midline. Tongue protrusion into either cheek is normal. Shoulder shrug is normal.   Motor exam:   Normal tone ,muscle bulk and symmetric strength in all extremities. Her knee buckles- she is unsteady . But she has good strength.   Sensory:  Fine touch, pinprick and vibration were tested in all extremities. Proprioception was abnormal  Coordination: Rapid alternating movements in the fingers/hands were normal.  Finger-to-nose maneuver  normal without evidence of ataxia, dysmetria or tremor.  Gait and station: Patient walks with a cane for  assistive device. Strength within normal limits.  Smaller step width.  Stance is wider based - she turned with 5 steps and used her cane. No hand tremor noticed, normal arm swing.  I worked with the patient and held her right hand was evident that she is leaning towards the right side, and when she turned her turns were very fragmented she is taking very small steps more than 4 return 180 degrees, I had her also turn her head left and right which did not lead to a fall but her gait slowed which tells me that she felt insecure or at least off balance and lightheaded. Deep tendon reflexes: in the  upper and lower extremities are symmetric 2 plus - and intact.  Babinski maneuver response is downgoing.    Assessment:  Dear Dr. Hilma Favors,  After physical and neurologic  examination, review of laboratory studies, imaging, neurophysiology testing and pre-existing records, assessment of our mutual patient is  that of :   Advanced microvascular diease and arthrosclerosis, larger vessels such as carotid syphons and vertebral arteries were affected, explaining the fall tendency after a head turn.  She had a TIA in November- back to normal in 60 minutes- not described as a likely seizure activity.   Dementia cannot be ruled out, she doesn't  even have enough point loss for MCI on MMSE but clearly had not the stamina to succeed on a MOCA  ( 21 /30 ) points. -   there is advanced atrophy with widened temporal sulci by brain MRI. Episodic loss of memory, amnestic spells. I woud use early dementia as a diagnosis.   Gait disorder related to advanced arthritis, knee replacement. She resumed PT.    Plan:  Treatment plan and additional workup :  NPI :  I will need to have her evaluated for vertebrobasilar insufficiency, carotid and vertebral arthrosclerosis.  Another appointment in  4 month with MOCA/ MMSE .  Asencion Partridge Nyellie Yetter MD 02/11/2019

## 2019-02-12 ENCOUNTER — Telehealth: Payer: Self-pay | Admitting: Neurology

## 2019-02-12 NOTE — Telephone Encounter (Signed)
Pt's sister [name] on the DPR states that she would like for the provider to call her so that she can correct something's that were said in the [employer]. Sister states that the pt gave incorrect information at her appt due to her memory and they did not want to upset the pt by correcting her at the time. Please advise.

## 2019-02-12 NOTE — Telephone Encounter (Signed)
Called the sister back and reviewed with her the things that needed to be updated that they didn't want to discuss with potentially upsetting the pt.  Pt has been anxious more recently because her son is traveling to visit grandson who is leaving for [employer]. She states the pt usually goes to bed around 10-11 pm and then also wakes up about 10-11 am. She does wake up through the night to pee. She recently had a fall out of the bed but the night time sitter was able to catch her by the waist so there was no injury with the fall. Sister informed that her balance is slightly better and she is using the cane but from recent rotator cuff repair she had therapy with her working on using the cane and so that hindered balance progression. She states if walking straight she is fine when she makes quick turns it throws her balance off.  Memory is still affected and seems it typically worsens in the evening. She states that she is often agitated if corrected.  She reviewed a few medications that needed to be updated that were initially given to me incorrectly and I have made the change on the patients chart. Pt sister also would like me to send copy of notes from yesterdays apt to the patient's address on file. Advised I would so this.  Advised the sister I would send this phone note to Dr Brett Fairy as Juluis Rainier so she could review. Patient was appreciative

## 2019-02-16 NOTE — Telephone Encounter (Signed)
I have upgraded the chart- it was evident that everything and all was much too good to be true.  There was no opportunity to speak to the patient alone, nor to speak to her caretakers and family.   I will need a face to face with patient and NP in 2-3 month to see where things are. CD

## 2019-02-18 NOTE — Telephone Encounter (Signed)
Called the patient she is scheduled 9/9 for follow up visit with Ward Givens, NP

## 2019-03-06 DIAGNOSIS — R350 Frequency of micturition: Secondary | ICD-10-CM | POA: Diagnosis not present

## 2019-03-06 DIAGNOSIS — N390 Urinary tract infection, site not specified: Secondary | ICD-10-CM | POA: Diagnosis not present

## 2019-03-06 DIAGNOSIS — Z681 Body mass index (BMI) 19 or less, adult: Secondary | ICD-10-CM | POA: Diagnosis not present

## 2019-03-13 ENCOUNTER — Emergency Department (HOSPITAL_COMMUNITY): Payer: Medicare Other

## 2019-03-13 ENCOUNTER — Encounter (HOSPITAL_COMMUNITY): Payer: Self-pay | Admitting: Emergency Medicine

## 2019-03-13 ENCOUNTER — Emergency Department (HOSPITAL_COMMUNITY)
Admission: EM | Admit: 2019-03-13 | Discharge: 2019-03-13 | Disposition: A | Discharge status: Home / Self Care | Payer: Medicare Other | Attending: Emergency Medicine | Admitting: Emergency Medicine

## 2019-03-13 ENCOUNTER — Other Ambulatory Visit: Payer: Self-pay

## 2019-03-13 DIAGNOSIS — Z96652 Presence of left artificial knee joint: Secondary | ICD-10-CM | POA: Insufficient documentation

## 2019-03-13 DIAGNOSIS — I10 Essential (primary) hypertension: Secondary | ICD-10-CM | POA: Insufficient documentation

## 2019-03-13 DIAGNOSIS — Z7982 Long term (current) use of aspirin: Secondary | ICD-10-CM | POA: Insufficient documentation

## 2019-03-13 DIAGNOSIS — R4182 Altered mental status, unspecified: Secondary | ICD-10-CM | POA: Diagnosis present

## 2019-03-13 DIAGNOSIS — Z79899 Other long term (current) drug therapy: Secondary | ICD-10-CM | POA: Diagnosis not present

## 2019-03-13 DIAGNOSIS — M1711 Unilateral primary osteoarthritis, right knee: Secondary | ICD-10-CM | POA: Diagnosis not present

## 2019-03-13 DIAGNOSIS — R29818 Other symptoms and signs involving the nervous system: Secondary | ICD-10-CM | POA: Diagnosis not present

## 2019-03-13 DIAGNOSIS — R410 Disorientation, unspecified: Secondary | ICD-10-CM | POA: Diagnosis not present

## 2019-03-13 DIAGNOSIS — R413 Other amnesia: Secondary | ICD-10-CM | POA: Diagnosis not present

## 2019-03-13 DIAGNOSIS — R4701 Aphasia: Secondary | ICD-10-CM | POA: Diagnosis not present

## 2019-03-13 LAB — URINALYSIS, ROUTINE W REFLEX MICROSCOPIC
Bilirubin Urine: NEGATIVE
Glucose, UA: NEGATIVE mg/dL
Hgb urine dipstick: NEGATIVE
Ketones, ur: NEGATIVE mg/dL
Leukocytes,Ua: NEGATIVE
Nitrite: NEGATIVE
Protein, ur: NEGATIVE mg/dL
Specific Gravity, Urine: 1.003 — ABNORMAL LOW (ref 1.005–1.030)
pH: 7 (ref 5.0–8.0)

## 2019-03-13 LAB — ETHANOL: Alcohol, Ethyl (B): <10 mg/dL (ref <10)

## 2019-03-13 LAB — RAPID URINE DRUG SCREEN, HOSP PERFORMED
Amphetamines: NOT DETECTED
Barbiturates: NOT DETECTED
Benzodiazepines: NOT DETECTED
Cocaine: NOT DETECTED
Opiates: NOT DETECTED
Tetrahydrocannabinol: NOT DETECTED

## 2019-03-13 LAB — CBC WITH DIFFERENTIAL/PLATELET
Abs Immature Granulocytes: 0.03 10*3/uL (ref 0.00–0.07)
Basophils Absolute: 0 10*3/uL (ref 0.0–0.1)
Basophils Relative: 1 %
Eosinophils Absolute: 0.1 10*3/uL (ref 0.0–0.5)
Eosinophils Relative: 3 %
HCT: 34.6 % — ABNORMAL LOW (ref 36.0–46.0)
Hemoglobin: 11.5 g/dL — ABNORMAL LOW (ref 12.0–15.0)
Immature Granulocytes: 1 %
Lymphocytes Relative: 35 %
Lymphs Abs: 1.9 10*3/uL (ref 0.7–4.0)
MCH: 32 pg (ref 26.0–34.0)
MCHC: 33.2 g/dL (ref 30.0–36.0)
MCV: 96.4 fL (ref 80.0–100.0)
Monocytes Absolute: 0.9 10*3/uL (ref 0.1–1.0)
Monocytes Relative: 16 %
Neutro Abs: 2.4 10*3/uL (ref 1.7–7.7)
Neutrophils Relative %: 44 %
Platelets: 200 10*3/uL (ref 150–400)
RBC: 3.59 MIL/uL — ABNORMAL LOW (ref 3.87–5.11)
RDW: 12 % (ref 11.5–15.5)
WBC: 5.4 10*3/uL (ref 4.0–10.5)
nRBC: 0 % (ref 0.0–0.2)

## 2019-03-13 LAB — CBG MONITORING, ED: Glucose-Capillary: 98 mg/dL (ref 70–99)

## 2019-03-13 LAB — PROTIME-INR
INR: 1 (ref 0.8–1.2)
Prothrombin Time: 13.4 seconds (ref 11.4–15.2)

## 2019-03-13 LAB — APTT: aPTT: 29 seconds (ref 24–36)

## 2019-03-13 MED ORDER — SODIUM CHLORIDE 0.9% FLUSH
3.0000 mL | Freq: Once | INTRAVENOUS | Status: AC
  Administered 2019-03-13: 3 mL via INTRAVENOUS

## 2019-03-13 NOTE — Progress Notes (Signed)
Code Stroke Times: 2099 Call Time Not beeped 1630 Exam Started [facility] 0689 Images sent 3406 GR called  8403 Exam completed in EPIC

## 2019-03-13 NOTE — ED Triage Notes (Signed)
Pt our w daughter Claire West here after 5 monutes of altered mental   Pt has difficulty answering questions  But appears to move ad lib albeit weakly

## 2019-03-13 NOTE — ED Notes (Addendum)
Patient not on cardiac monitoring upon entering room, vitals not taken in about 2 hours. Patient needed to use restroom, patient able to walk to restroom with 1 person assistance. RN Janett Billow notified.

## 2019-03-13 NOTE — ED Provider Notes (Signed)
[facility] EMERGENCY DEPARTMENT Provider Note   CSN: [id number] Arrival date & time: 03/13/19  1609    History   Chief Complaint Chief Complaint  Patient presents with  . Altered Mental Status    HPI Claire West is a 78 y.o. female.     The history is provided by the patient and a relative.  Altered Mental Status  Presenting symptoms: disorientation   Presenting symptoms comment:  Altered speech  Severity:  Moderate Duration: started approx 5 minutes before arrival here. Timing:  Constant Chronicity:  Recurrent (had similar episode requiring admission 11/19. Had uti at the time, but also with imaging positive for subacute cva.) Associated symptoms: normal movement, no agitation, no difficulty breathing, no headaches, no nausea and no visual change   Associated symptoms comment:  No other complaint of pain or weakness, no headaches, denies any recent illness.   Further information obtained from patient's Sister [name] who states that the patient has mild cognitive impairment but had increased confusion starting yesterday evening first noticed around 9 PM.  Today minutes before arriving here she was at a local garden center when she developed increased confusion and had difficulty speaking with slurred speech.   Past Medical History:  Diagnosis Date  . Depression   . Gait instability    walks with cane  . GERD (gastroesophageal reflux disease)   . Hyperlipemia   . Hypertension   . Knee pain   . Mild cognitive impairment with memory loss   . Seasonal allergies     Patient Active Problem List   Diagnosis Date Noted  . Falls frequently 10/28/2018  . TIA (transient ischemic attack) 08/23/2018  . Delirium in remission 07/07/2018  . OA (osteoarthritis) of knee 05/05/2018  . Amnestic MCI (mild cognitive impairment with memory loss) 12/24/2017  . Acute metabolic encephalopathy 21/30/8657  . Altered mental status 11/06/2017  . Hyponatremia 02/12/2016  . Hypokalemia  02/12/2016  . Dehydration 02/12/2016  . Elevated LFTs 02/12/2016  . HTN (hypertension) 02/12/2016  . GERD (gastroesophageal reflux disease) 02/12/2016    Past Surgical History:  Procedure Laterality Date  . ABDOMINAL HYSTERECTOMY    . ARTHROSCOPIC REPAIR ACL    . COLONOSCOPY    . COLONOSCOPY N/A 11/23/2015   Procedure: COLONOSCOPY;  Surgeon: Rogene Houston, MD;  Location: [facility];  Service: Endoscopy;  Laterality: N/A;  830  . EYE SURGERY Bilateral    Cataracts  . TOTAL KNEE ARTHROPLASTY Left 05/05/2018   Procedure: LEFT TOTAL KNEE ARTHROPLASTY;  Surgeon: Gaynelle Arabian, MD;  Location: [facility];  Service: Orthopedics;  Laterality: Left;  with block     OB History    Gravida  1   Para  1   Term  1   Preterm      AB      Living  1     SAB      TAB      Ectopic      Multiple      Live Births               Home Medications    Prior to Admission medications   Medication Sig Start Date End Date Taking? Authorizing Provider  acetaminophen (TYLENOL) 500 MG tablet Take 500 mg by mouth daily as needed for mild pain.   Yes [provider]  amLODipine (NORVASC) 5 MG tablet Take 2.5 mg by mouth daily.  10/22/18  Yes [provider]  aspirin EC 81 MG tablet  Take 81 mg by mouth daily.   Yes [provider]  escitalopram (LEXAPRO) 10 MG tablet Take 10 mg by mouth at bedtime. 03/07/18  Yes [provider]  fexofenadine (ALLEGRA) 180 MG tablet Take 180 mg by mouth daily.   Yes [provider]  Javier Docker Oil 500 MG CAPS Take 500 mg by mouth daily at 12 noon.   Yes [provider]  levofloxacin (LEVAQUIN) 500 MG tablet Take 250 mg by mouth 2 (two) times a day.   Yes [provider]  metoprolol succinate (TOPROL-XL) 100 MG 24 hr tablet Take 100 mg by mouth daily. Take with or immediately following a meal.    Yes [provider]  Multiple Vitamin (MULTIVITAMIN WITH MINERALS) TABS tablet Take 1 tablet by mouth  daily. Women's Multivitamin   Yes [provider]  omeprazole (PRILOSEC) 20 MG capsule Take 20 mg by mouth daily before breakfast.  04/18/15  Yes [provider]  potassium chloride (K-DUR) 10 MEQ tablet Take 1 tablet (10 mEq total) by mouth daily. Patient taking differently: Take 10 mEq by mouth daily at 12 noon.  11/04/17  Yes Noemi Chapel, MD  pravastatin (PRAVACHOL) 20 MG tablet Take 20 mg by mouth daily. 03/10/19  Yes [provider]  Probiotic Product (PROBIOTIC PO) Take 1 capsule by mouth daily. 64 Billion   Yes [provider]    Family History Family History  Problem Relation Age of Onset  . Lung cancer Mother   . Lung cancer Sister     Social History Social History   Tobacco Use  . Smoking status: Never Smoker  . Smokeless tobacco: Never Used  Substance Use Topics  . Alcohol use: No  . Drug use: No     Allergies   Adhesive [tape]; Codeine; Neosporin [neomycin-bacitracin zn-polymyx]; Penicillins; and Sulfa antibiotics   Review of Systems Review of Systems  Unable to perform ROS: Mental status change  Gastrointestinal: Negative for nausea.  Neurological: Negative for headaches.  Psychiatric/Behavioral: Negative for agitation.     Physical Exam Updated Vital Signs BP (!) 163/83   Pulse 72   Temp 97.8 F (36.6 C) (Oral)   Resp 20   Ht 5\' 3"  (1.6 m)   Wt 59 kg   SpO2 98%   BMI 23.03 kg/m   Physical Exam Vitals signs and nursing note reviewed.  Constitutional:      General: She is not in acute distress.    Appearance: She is well-developed. She is not ill-appearing.  HENT:     Head: Normocephalic and atraumatic.     Mouth/Throat:     Mouth: Mucous membranes are moist.  Eyes:     Extraocular Movements: Extraocular movements intact.     Conjunctiva/sclera: Conjunctivae normal.     Pupils: Pupils are equal, round, and reactive to light.  Neck:     Musculoskeletal: Normal range of motion.  Cardiovascular:     Rate  and Rhythm: Normal rate and regular rhythm.     Heart sounds: Normal heart sounds.  Pulmonary:     Effort: Pulmonary effort is normal.     Breath sounds: Normal breath sounds. No wheezing.  Abdominal:     General: Bowel sounds are normal.     Palpations: Abdomen is soft.     Tenderness: There is no abdominal tenderness.  Musculoskeletal: Normal range of motion.        General: No swelling.  Skin:    General: Skin is warm and dry.  Neurological:     Mental Status: She is alert. She is confused.     Sensory: Sensation is intact.     Motor: Motor function is intact.     Comments: Difficulty answering questions appropriately but seems to wax and wane. Some grasping for words but no dysarthria. A&O to person and time, knows she's in [place], cannot name facility.  Moves all 4 extremities. No facial droop.      ED Treatments / Results  Labs (all labs ordered are listed, but only abnormal results are displayed) Labs Reviewed  URINALYSIS, ROUTINE W REFLEX MICROSCOPIC - Abnormal; Notable for the following components:      Result Value   Color, Urine STRAW (*)    Specific Gravity, Urine 1.003 (*)    All other components within normal limits  CBC WITH DIFFERENTIAL/PLATELET - Abnormal; Notable for the following components:   RBC 3.59 (*)    Hemoglobin 11.5 (*)    HCT 34.6 (*)    All other components within normal limits  ETHANOL  PROTIME-INR  APTT  RAPID URINE DRUG SCREEN, HOSP PERFORMED  CBG MONITORING, ED    EKG EKG Interpretation  Date/Time:  Friday March 13 2019 16:21:05 EDT Ventricular Rate:  73 PR Interval:    QRS Duration: 100 QT Interval:  392 QTC Calculation: 432 R Axis:   -17 Text Interpretation:  Sinus rhythm Borderline left axis deviation Confirmed by Veryl Speak [phone]) on 03/13/2019 4:45:49 PM   Radiology Mr Brain Wo Contrast  Result Date: 03/13/2019 CLINICAL DATA:  Memory loss with confusion. Sudden onset of aphasia. EXAM: MRI HEAD WITHOUT CONTRAST  TECHNIQUE: Multiplanar, multiecho pulse sequences of the brain and surrounding structures were obtained without intravenous contrast. COMPARISON:  CT head earlier today. FINDINGS: Brain: Generalized atrophy. Advanced chronic microvascular ischemic change. Multiple old infarcts, notably RIGHT centrum semiovale, and RIGHT thalamus. There are tiny foci of chronic hemorrhage in the brainstem. No acute stroke, acute hemorrhage, mass lesion, or extra-axial fluid. Hydrocephalus ex vacuo. Vascular: Flow voids are maintained.  No large vessel occlusion. Skull and upper cervical spine: Normal marrow signal. Pannus surrounds the odontoid. There is mild stenosis the cervicomedullary junction. Sinuses/Orbits: Negative. Other: None. IMPRESSION: No acute stroke. Atrophy and small vessel disease. Chronic changes as described. Electronically Signed   By: Staci Righter M.D.   On: 03/13/2019 18:33   Ct Head Code Stroke Wo Contrast  Result Date: 03/13/2019 CLINICAL DATA:  Code stroke. 78 year old female with sudden onset aphasia. EXAM: CT HEAD WITHOUT CONTRAST TECHNIQUE: Contiguous axial images were obtained from the base of the skull through the vertex without intravenous contrast. COMPARISON:  Brain MRI 08/23/2018 and earlier. FINDINGS: Brain: Chronic lacunar infarcts in the right deep gray matter nuclei and confluent bilateral cerebral white matter hypodensity. Stable gray-white matter differentiation throughout the brain. No midline shift, ventriculomegaly, mass effect, evidence of mass lesion, intracranial hemorrhage or evidence of cortically based acute infarction. No cortical encephalomalacia identified. Vascular: Calcified atherosclerosis at the skull base. No suspicious intracranial vascular hyperdensity. Skull: No acute osseous abnormality identified. Sinuses/Orbits: Visualized paranasal sinuses and mastoids are stable and well pneumatized. Other: No acute orbit or scalp soft tissue findings. ASPECTS [facility] Stroke  Program Early CT Score) - Ganglionic level infarction (caudate, lentiform nuclei, internal capsule, insula, M1-M3 cortex): - Supraganglionic infarction (M4-M6 cortex): Total score (0-10 with 10 being normal): IMPRESSION: 1. Stable non contrast CT appearance of the brain since 2019 with advanced chronic small vessel disease. 2. ASPECTS is 10. 3. Study discussed by  telephone with Dr. Stark Jock on 03/13/2019 at 16:59 . Electronically Signed   By: Genevie Ann M.D.   On: 03/13/2019 17:00    Procedures Procedures (including critical care time)  Medications Ordered in ED Medications  sodium chloride flush (NS) 0.9 % injection 3 mL (3 mLs Intravenous Given 03/13/19 1941)     Initial Impression / Assessment and Plan / ED Course  I have reviewed the triage vital signs and the nursing notes.  Pertinent labs & imaging results that were available during my care of the patient were reviewed by me and considered in my medical decision making (see chart for details).        Labs, CT imaging and MRI obtained with no obvious metabolic or stroke findings.  By the end of patient's visit here she was awake, completely symptom-free, was able to describe the course of her day including a visit to her orthopedist this morning to discuss surgery of her right knee.  She went to a garden center this afternoon with her caregiver and, describes feeling very flushed and hot, became mildly nauseated and confused.  She denies having LOC.  She is back to her baseline at this time.  Given her negative work-up here and return to baseline it was felt appropriate for her to be discharged home which she agrees, discussed with sister who is also 1 of her home caregivers who agrees with plan.  Plan to follow-up with her PCP for recheck early next week.  Patient was also seen by Dr. Stark Jock prior to discharge home.  Possible she became overheated as a source of these apparent transient symptoms, perhaps became vasovagal.  The patient appears  reasonably screened and/or stabilized for discharge and I doubt any other medical condition or other [facility] requiring further screening, evaluation, or treatment in the ED at this time prior to discharge.   Final Clinical Impressions(s) / ED Diagnoses   Final diagnoses:  Transient confusion    ED Discharge Orders    None       Landis Martins 03/13/19 2012    Veryl Speak, MD 03/13/19 2252

## 2019-03-13 NOTE — Consult Note (Addendum)
[facility] Tele[facility]urology Consult Services   Date of Service:   03/13/2019 16:51:54  Impression:     .  Rule Out Acute Ischemic Stroke     .  Transient Ischemic Attack  Comments/Sign-Out: worsening of baseline memory loss and confusion with a nonfocal exam and NIHSS 0- unclear if the symptoms have resolved. ?TIA versus seizure versus metabolic/infectious/toxic encephalopathy.  Mechanism of Stroke: Not Clear  Metrics: Last Known Well: 03/12/2019 21:30:00 [facility] Notification Time: 03/13/2019 16:51:54 Arrival Time: 03/13/2019 16:09:00 Stamp Time: 03/13/2019 16:51:54 Time First Login Attempt: 03/13/2019 17:00:26 Video Start Time: 03/13/2019 17:00:26  Symptoms: confusion NIHSS Start Assessment Time: 03/13/2019 17:03:12 Patient is not a candidate for tPA. Patient was not deemed candidate for tPA thrombolytics because of Last Well Known Above 4.5 Hours. Video End Time: 03/13/2019 17:13:36  CT head showed no acute hemorrhage or acute core infarct.  Clinical Presentation is not Suggestive of Large Vessel Occlusive Disease  ED Physician notified of diagnostic impression and management plan on 03/13/2019 17:14:08  Our recommendations are outlined below.  Recommendations:     .  Activate Stroke Protocol Admission/Order Set     .  Stroke/Telemetry Floor     .  Neuro Checks     .  Bedside Swallow Eval     .  DVT Prophylaxis     .  IV Fluids, Normal Saline     .  Head of Bed 30 Degrees     .  Euglycemia and Avoid Hyperthermia (PRN Acetaminophen)     .  Antiplatelet Therapy Recommended  Routine Consultation with [facility] for Follow up Care  Sign Out:     .  Discussed with Emergency Department Provider    ------------------------------------------------------------------------------  History of Present Illness: Patient is a 78 year old Female.  Patient was brought by EMS for symptoms of confusion  78 year old woman with history of  dementia, TIA, and GERD who presents with worsening memory loss and confusion upon awakening at 8 AM today (per nursing). It then worsened even more today at 3:30 pm so she came to the ED. She was last normal at 9:30 pm last night before she went to bed. She cannot tell me when her symptoms worsened due to her memory difficulties and the family is not at the bedside. I called the phone number provided to me by nursing (and also in the cart) but the person who answered said it was the wrong number when I asked for Chrystie Nose. Per nursing, the patient lives at home with a health aid but is mostly independent.  ADDENDUM: Spoke with Chrystie Nose, the patient's sister.  Ms. Laurance Flatten confirmed that the patient was last normal when she went to bed at 9:30 pm.  At 3:30 pm, she began slurring her speech and her eyes did not "look right."  She is currently on Levaquin for UTI.    Examination: 1A: Level of Consciousness - Alert; keenly responsive + 0 1B: Ask Month and Age - Both Questions Right + 0 1C: Blink Eyes & Squeeze Hands - Performs Both Tasks + 0 2: Test Horizontal Extraocular Movements - Normal + 0 3: Test Visual Fields - No Visual Loss + 0 4: Test Facial Palsy (Use Grimace if Obtunded) - Normal symmetry + 0 5A: Test Left Arm Motor Drift - No Drift for 10 Seconds + 0 5B: Test Right Arm Motor Drift - No Drift for 10 Seconds + 0 6A: Test Left Leg Motor Drift - No Drift  for 5 Seconds + 0 6B: Test Right Leg Motor Drift - No Drift for 5 Seconds + 0 7: Test Limb Ataxia (FNF/Heel-Shin) - No Ataxia + 0 8: Test Sensation - Normal; No sensory loss + 0 9: Test Language/Aphasia - Normal; No aphasia + 0 10: Test Dysarthria - Normal + 0 11: Test Extinction/Inattention - No abnormality + 0  NIHSS Score: 0  Patient/Family was informed the Neurology Consult would happen via TeleHealth consult by way of interactive audio and video telecommunications and consented to receiving care in this manner.  Due to the  immediate potential for life-threatening deterioration due to underlying acute neurologic illness, I spent 35 minutes providing critical care. This time includes time for face to face visit via telemedicine, review of medical records, imaging studies and discussion of findings with providers, the patient and/or family.   Dr Uvaldo Bristle   [facility] [phone]  Case [id number]

## 2019-05-13 DIAGNOSIS — I10 Essential (primary) hypertension: Secondary | ICD-10-CM | POA: Diagnosis not present

## 2019-05-13 DIAGNOSIS — Z01812 Encounter for preprocedural laboratory examination: Secondary | ICD-10-CM | POA: Diagnosis not present

## 2019-05-13 DIAGNOSIS — Z6824 Body mass index (BMI) 24.0-24.9, adult: Secondary | ICD-10-CM | POA: Diagnosis not present

## 2019-05-13 DIAGNOSIS — I639 Cerebral infarction, unspecified: Secondary | ICD-10-CM | POA: Diagnosis not present

## 2019-05-13 DIAGNOSIS — E876 Hypokalemia: Secondary | ICD-10-CM | POA: Diagnosis not present

## 2019-05-13 DIAGNOSIS — E7849 Other hyperlipidemia: Secondary | ICD-10-CM | POA: Diagnosis not present

## 2019-05-13 DIAGNOSIS — Z01810 Encounter for preprocedural cardiovascular examination: Secondary | ICD-10-CM | POA: Diagnosis not present

## 2019-05-13 DIAGNOSIS — E538 Deficiency of other specified B group vitamins: Secondary | ICD-10-CM | POA: Diagnosis not present

## 2019-05-13 DIAGNOSIS — D649 Anemia, unspecified: Secondary | ICD-10-CM | POA: Diagnosis not present

## 2019-05-13 DIAGNOSIS — G459 Transient cerebral ischemic attack, unspecified: Secondary | ICD-10-CM | POA: Diagnosis not present

## 2019-05-13 DIAGNOSIS — M1991 Primary osteoarthritis, unspecified site: Secondary | ICD-10-CM | POA: Diagnosis not present

## 2019-05-25 DIAGNOSIS — B0089 Other herpesviral infection: Secondary | ICD-10-CM | POA: Diagnosis not present

## 2019-05-25 DIAGNOSIS — C44519 Basal cell carcinoma of skin of other part of trunk: Secondary | ICD-10-CM | POA: Diagnosis not present

## 2019-05-25 DIAGNOSIS — C44612 Basal cell carcinoma of skin of right upper limb, including shoulder: Secondary | ICD-10-CM | POA: Diagnosis not present

## 2019-05-27 ENCOUNTER — Other Ambulatory Visit (HOSPITAL_COMMUNITY): Payer: Self-pay | Admitting: Family Medicine

## 2019-05-27 DIAGNOSIS — E2839 Other primary ovarian failure: Secondary | ICD-10-CM

## 2019-05-27 DIAGNOSIS — Z1231 Encounter for screening mammogram for malignant neoplasm of breast: Secondary | ICD-10-CM

## 2019-05-27 NOTE — H&P (Signed)
TOTAL KNEE ADMISSION H&P  Patient is being admitted for right total knee arthroplasty.  Subjective:  Chief Complaint:right knee pain.  HPI: Claire West, 78 y.o. female, has a history of pain and functional disability in the right knee due to arthritis and has failed non-surgical conservative treatments for greater than 12 weeks to includeviscosupplementation injections, use of assistive devices and activity modification.  Onset of symptoms was gradual, starting several years ago with gradually worsening course since that time. The patient noted no past surgery on the right knee(s).  Patient currently rates pain in the right knee(s) at 8 out of 10 with activity. Patient has worsening of pain with activity and weight bearing, pain that interferes with activities of daily living and crepitus.  Patient has evidence of tri-compartmental bone-on-bone changes with large osteophytes by imaging studies. There is no active infection.  Patient Active Problem List   Diagnosis Date Noted   Falls frequently 10/28/2018   TIA (transient ischemic attack) 08/23/2018   Delirium in remission 07/07/2018   OA (osteoarthritis) of knee 05/05/2018   Amnestic MCI (mild cognitive impairment with memory loss) 15/17/6160   Acute metabolic encephalopathy 73/71/0626   Altered mental status 11/06/2017   Hyponatremia 02/12/2016   Hypokalemia 02/12/2016   Dehydration 02/12/2016   Elevated LFTs 02/12/2016   HTN (hypertension) 02/12/2016   GERD (gastroesophageal reflux disease) 02/12/2016   Past Medical History:  Diagnosis Date   Depression    Gait instability    walks with cane   GERD (gastroesophageal reflux disease)    Hyperlipemia    Hypertension    Knee pain    Mild cognitive impairment with memory loss    Seasonal allergies     Past Surgical History:  Procedure Laterality Date   ABDOMINAL HYSTERECTOMY     ARTHROSCOPIC REPAIR ACL     COLONOSCOPY     COLONOSCOPY N/A 11/23/2015    Procedure: COLONOSCOPY;  Surgeon: Rogene Houston, MD;  Location: [facility];  Service: Endoscopy;  Laterality: N/A;  830   EYE SURGERY Bilateral    Cataracts   TOTAL KNEE ARTHROPLASTY Left 05/05/2018   Procedure: LEFT TOTAL KNEE ARTHROPLASTY;  Surgeon: Gaynelle Arabian, MD;  Location: [facility];  Service: Orthopedics;  Laterality: Left;  with block    No current facility-administered medications for this encounter.    Current Outpatient Medications  Medication Sig Dispense Refill Last Dose   acetaminophen (TYLENOL) 500 MG tablet Take 500 mg by mouth daily as needed for mild pain.   03/12/2019 at Unknown time   amLODipine (NORVASC) 5 MG tablet Take 2.5 mg by mouth daily.    03/12/2019 at Unknown time   aspirin EC 81 MG tablet Take 81 mg by mouth daily.   03/13/2019 at 800   escitalopram (LEXAPRO) 10 MG tablet Take 10 mg by mouth at bedtime.  1 03/12/2019 at Unknown time   fexofenadine (ALLEGRA) 180 MG tablet Take 180 mg by mouth daily.   03/13/2019 at Unknown time   Krill Oil 500 MG CAPS Take 500 mg by mouth daily at 12 noon.   03/13/2019 at Unknown time   levofloxacin (LEVAQUIN) 500 MG tablet Take 250 mg by mouth 2 (two) times a day.   03/13/2019 at Unknown time   metoprolol succinate (TOPROL-XL) 100 MG 24 hr tablet Take 100 mg by mouth daily. Take with or immediately following a meal.    03/13/2019 at 800   Multiple Vitamin (MULTIVITAMIN WITH MINERALS) TABS tablet Take 1 tablet by mouth  daily. Women's Multivitamin   03/13/2019 at Unknown time   omeprazole (PRILOSEC) 20 MG capsule Take 20 mg by mouth daily before breakfast.    03/13/2019 at Unknown time   potassium chloride (K-DUR) 10 MEQ tablet Take 1 tablet (10 mEq total) by mouth daily. (Patient taking differently: Take 10 mEq by mouth daily at 12 noon. ) 5 tablet 0 03/13/2019 at Unknown time   pravastatin (PRAVACHOL) 20 MG tablet Take 20 mg by mouth daily.   03/12/2019 at Unknown time   Probiotic Product (PROBIOTIC PO) Take 1 capsule by mouth  daily. 25 Billion   03/13/2019 at Unknown time   Allergies  Allergen Reactions   Adhesive [Tape] Itching and Rash    Irritation at site   Codeine Hives and Rash   Neosporin [Neomycin-Bacitracin Zn-Polymyx] Rash   Penicillins Rash    Oral rash/peeling Has patient had a PCN reaction causing immediate rash, facial/tongue/throat swelling, SOB or lightheadedness with hypotension: Yes Has patient had a PCN reaction causing severe rash involving mucus membranes or skin necrosis: No Has patient had a PCN reaction that required hospitalization No Has patient had a PCN reaction occurring within the last 10 years: No If all of the above answers are "NO", then may proceed with Cephalosporin use.    Sulfa Antibiotics Hives and Rash    Social History   Tobacco Use   Smoking status: Never Smoker   Smokeless tobacco: Never Used  Substance Use Topics   Alcohol use: No    Family History  Problem Relation Age of Onset   Lung cancer Mother    Lung cancer Sister      Review of Systems  Constitutional: Negative for chills and fever.  HENT: Negative for congestion, sore throat and tinnitus.   Eyes: Negative for double vision, photophobia and pain.  Respiratory: Negative for cough, shortness of breath and wheezing.   Cardiovascular: Negative for chest pain, palpitations and orthopnea.  Gastrointestinal: Negative for heartburn, nausea and vomiting.  Genitourinary: Negative for dysuria, frequency and urgency.  Musculoskeletal: Positive for joint pain.  Neurological: Negative for dizziness, weakness and headaches.    Objective:  Physical Exam  Well nourished and well developed.  General: Alert and oriented x3, cooperative and pleasant, no acute distress.  Head: normocephalic, atraumatic, neck supple.  Eyes: EOMI.  Respiratory: breath sounds clear in all fields, no wheezing, rales, or rhonchi. Cardiovascular: Regular rate and rhythm, no murmurs, gallops or rubs.  Abdomen: non-tender  to palpation and soft, normoactive bowel sounds. Musculoskeletal:  Right Knee Exam:  No effusion present.  The range of motion is: a few degrees of hyper-extension to 125 degrees of flexion.  Marked crepitus on range of motion of the knee.  No medial joint line tenderness. No lateral joint line tenderness.  Significant amount of varus-valgus play as well as mild AP play.  Calves soft and nontender. Motor function intact in LE. Strength 5/5 LE bilaterally. Neuro: Distal pulses 2+. Sensation to light touch intact in LE.  Labs:   Estimated body mass index is 23.03 kg/m as calculated from the following:   Height as of 03/13/19: 5\' 3"  (1.6 m).   Weight as of 03/13/19: 59 kg.   Imaging Review Plain radiographs demonstrate severe degenerative joint disease of the right knee(s). The overall alignment isneutral. The bone quality appears to be adequate for age and reported activity level.  Assessment/Plan:  End stage arthritis, right knee   The patient history, physical examination, clinical judgment of the provider and  imaging studies are consistent with end stage degenerative joint disease of the right knee(s) and total knee arthroplasty is deemed medically necessary. The treatment options including medical management, injection therapy arthroscopy and arthroplasty were discussed at length. The risks and benefits of total knee arthroplasty were presented and reviewed. The risks due to aseptic loosening, infection, stiffness, patella tracking problems, thromboembolic complications and other imponderables were discussed. The patient acknowledged the explanation, agreed to proceed with the plan and consent was signed. Patient is being admitted for inpatient treatment for surgery, pain control, PT, OT, prophylactic antibiotics, VTE prophylaxis, progressive ambulation and ADL's and discharge planning. The patient is planning to be discharged home.  Anticipated LOS equal to or greater than 2 midnights  due to - Age 61 and older with one or more of the following:  - Obesity  - Expected need for hospital services (PT, OT, Nursing) required for safe  discharge  - Anticipated need for postoperative skilled nursing care or inpatient rehab  - Active co-morbidities: Stroke OR   - Unanticipated findings during/Post Surgery: None  - Patient is a high risk of re-admission due to: None  Therapy Plans: Outpatient therapy at [facility] in [place] Disposition: Home with family and CNA Planned DVT Prophylaxis: Xarelto 10 mg daily (hx mini strokes) DME needed: None PCP: Sharilyn Sites, MD TXA: IV Allergies: Codeine (hives), PCN (rash), sulfa (rash) Anesthesia Concerns: None BMI: 22  Other:  Bundle patient Pt had confusion with dilaudid last admission Cognitive impairment; sister and son help with medical decision making  - Patient was instructed on what medications to stop prior to surgery. - Follow-up visit in 2 weeks with Dr. Wynelle Link - Begin physical therapy following surgery - Pre-operative lab work as pre-surgical testing - Prescriptions will be provided in hospital at time of discharge  Theresa Duty, PA-C Orthopedic Surgery [facility] [place]

## 2019-06-09 ENCOUNTER — Encounter (HOSPITAL_COMMUNITY): Payer: Self-pay

## 2019-06-09 NOTE — Patient Instructions (Addendum)
DUE TO COVID-19 ONLY ONE VISITOR IS ALLOWED TO COME WITH YOU AND STAY IN THE WAITING ROOM ONLY DURING PRE OP AND PROCEDURE. THE ONE VISITOR MAY VISIT WITH YOU IN YOUR PRIVATE ROOM DURING VISITING HOURS ONLY!!   COVID SWAB TESTING MUST BE COMPLETED ON: Thursday, Sept. 10, 2020 at 1:00PM. Short Stay entrance at [facility], enter pre surgical testing line (Must self quarantine after testing. Follow instructions on handout.)             Your procedure is scheduled on: Monday, Sept. 14, 2020   Report to [facility] Main  Entrance    Report to admitting at 9:05 AM   Call this number if you have problems the morning of surgery [phone]   Do not eat food:After Midnight.   May have liquids until 8:30AM day of surgery   CLEAR LIQUID DIET  Foods Allowed                                                                     Foods Excluded  Water, Black Coffee and tea, regular and decaf                             liquids that you cannot  Plain Jell-O in any flavor  (No red)                                           see through such as: Fruit ices (not with fruit pulp)                                     milk, soups, orange juice  Iced Popsicles (No red)                                    All solid food Carbonated beverages, regular and diet                                    Apple juices Sports drinks like Gatorade (No red) Lightly seasoned clear broth or consume(fat free) Sugar, honey syrup  Sample Menu Breakfast                                Lunch                                     Supper Cranberry juice                    Beef broth                            Chicken broth Jell-O  Grape juice                           Apple juice Coffee or tea                        Jell-O                                      Popsicle                                                Coffee or tea                        Coffee or tea   Complete one  Ensure drink the morning of surgery at 8:30AM the day of surgery.   Brush your teeth the morning of surgery.   Do NOT smoke after Midnight   Take these medicines the morning of surgery with A SIP OF WATER:  Allegra, Toprol, Prilosec                               You may not have any metal on your body including hair pins, jewelry, and body piercings             Do not wear make-up, lotions, powders, perfumes/cologne, or deodorant             Do not wear nail polish.  Do not shave  48 hours prior to surgery.               Do not bring valuables to the hospital. Claire West.   Contacts, dentures or bridgework may not be worn into surgery.   Bring small overnight bag day of surgery.    Special Instructions: Bring a copy of your healthcare power of attorney and living will documents         the day of surgery if you haven't scanned them in before.              Please read over the following fact sheets you were given:  [facility] - Preparing for Surgery Before surgery, you can play an important role.  Because skin is not sterile, your skin needs to be as free of germs as possible.  You can reduce the number of germs on your skin by washing with CHG (chlorahexidine gluconate) soap before surgery.  CHG is an antiseptic cleaner which kills germs and bonds with the skin to continue killing germs even after washing. Please DO NOT use if you have an allergy to CHG or antibacterial soaps.  If your skin becomes reddened/irritated stop using the CHG and inform your nurse when you arrive at Short Stay. Do not shave (including legs and underarms) for at least 48 hours prior to the first CHG shower.  You may shave your face/neck.  Please follow these instructions carefully:  1.  Shower with CHG Soap the night before surgery and the  morning of surgery.  2.  If you choose to  wash your hair, wash your hair first as usual with your normal  shampoo.  3.   After you shampoo, rinse your hair and body thoroughly to remove the shampoo.                             4.  Use CHG as you would any other liquid soap.  You can apply chg directly to the skin and wash.  Gently with a scrungie or clean washcloth.  5.  Apply the CHG Soap to your body ONLY FROM THE NECK DOWN.   Do   not use on face/ open                           Wound or open sores. Avoid contact with eyes, ears mouth and   genitals (private parts).                       Wash face,  Genitals (private parts) with your normal soap.             6.  Wash thoroughly, paying special attention to the area where your    surgery  will be performed.  7.  Thoroughly rinse your body with warm water from the neck down.  8.  DO NOT shower/wash with your normal soap after using and rinsing off the CHG Soap.                9.  Pat yourself dry with a clean towel.            10.  Wear clean pajamas.            11.  Place clean sheets on your bed the night of your first shower and do not  sleep with pets. Day of Surgery : Do not apply any lotions/deodorants the morning of surgery.  Please wear clean clothes to the hospital/surgery center.  FAILURE TO FOLLOW THESE INSTRUCTIONS MAY RESULT IN THE CANCELLATION OF YOUR SURGERY  PATIENT SIGNATURE_________________________________  NURSE SIGNATURE__________________________________  ________________________________________________________________________   Claire West  An incentive spirometer is a tool that can help keep your lungs clear and active. This tool measures how well you are filling your lungs with each breath. Taking long deep breaths may help reverse or decrease the chance of developing breathing (pulmonary) problems (especially infection) following:  A long period of time when you are unable to move or be active. BEFORE THE PROCEDURE   If the spirometer includes an indicator to show your best effort, your nurse or respiratory therapist will  set it to a desired goal.  If possible, sit up straight or lean slightly forward. Try not to slouch.  Hold the incentive spirometer in an upright position. INSTRUCTIONS FOR USE  1. Sit on the edge of your bed if possible, or sit up as far as you can in bed or on a chair. 2. Hold the incentive spirometer in an upright position. 3. Breathe out normally. 4. Place the mouthpiece in your mouth and seal your lips tightly around it. 5. Breathe in slowly and as deeply as possible, raising the piston or the ball toward the top of the column. 6. Hold your breath for 3-5 seconds or for as long as possible. Allow the piston or ball to fall to the bottom of the column. 7. Remove the mouthpiece from your mouth and breathe out normally.  8. Rest for a few seconds and repeat Steps 1 through 7 at least 10 times every 1-2 hours when you are awake. Take your time and take a few normal breaths between deep breaths. 9. The spirometer may include an indicator to show your best effort. Use the indicator as a goal to work toward during each repetition. 10. After each set of 10 deep breaths, practice coughing to be sure your lungs are clear. If you have an incision (the cut made at the time of surgery), support your incision when coughing by placing a pillow or rolled up towels firmly against it. Once you are able to get out of bed, walk around indoors and cough well. You may stop using the incentive spirometer when instructed by your caregiver.  RISKS AND COMPLICATIONS  Take your time so you do not get dizzy or light-headed.  If you are in pain, you may need to take or ask for pain medication before doing incentive spirometry. It is harder to take a deep breath if you are having pain. AFTER USE  Rest and breathe slowly and easily.  It can be helpful to keep track of a log of your progress. Your caregiver can provide you with a simple table to help with this. If you are using the spirometer at home, follow these  instructions: [place] IF:   You are having difficultly using the spirometer.  You have trouble using the spirometer as often as instructed.  Your pain medication is not giving enough relief while using the spirometer.  You develop fever of 100.5 F (38.1 C) or higher. SEEK IMMEDIATE MEDICAL CARE IF:   You cough up bloody sputum that had not been present before.  You develop fever of 102 F (38.9 C) or greater.  You develop worsening pain at or near the incision site. MAKE SURE YOU:   Understand these instructions.  Will watch your condition.  Will get help right away if you are not doing well or get worse. Document Released: 02/04/2007 Document Revised: 12/17/2011 Document Reviewed: 04/07/2007 Exit[employer] Patient Information 2014 Exit[employer], [place].   ________________________________________________________________________  WHAT IS A BLOOD TRANSFUSION? Blood Transfusion Information  A transfusion is the replacement of blood or some of its parts. Blood is made up of multiple cells which provide different functions.  Red blood cells carry oxygen and are used for blood loss replacement.  White blood cells fight against infection.  Platelets control bleeding.  Plasma helps clot blood.  Other blood products are available for specialized needs, such as hemophilia or other clotting disorders. BEFORE THE TRANSFUSION  Who gives blood for transfusions?   Healthy volunteers who are fully evaluated to make sure their blood is safe. This is blood bank blood. Transfusion therapy is the safest it has ever been in the practice of medicine. Before blood is taken from a donor, a complete history is taken to make sure that person has no history of diseases nor engages in risky social behavior (examples are intravenous drug use or sexual activity with multiple partners). The donor's travel history is screened to minimize risk of transmitting infections, such as malaria. The donated  blood is tested for signs of infectious diseases, such as HIV and hepatitis. The blood is then tested to be sure it is compatible with you in order to minimize the chance of a transfusion reaction. If you or a relative donates blood, this is often done in anticipation of surgery and is not appropriate for emergency situations. It  takes many days to process the donated blood. RISKS AND COMPLICATIONS Although transfusion therapy is very safe and saves many lives, the main dangers of transfusion include:   Getting an infectious disease.  Developing a transfusion reaction. This is an allergic reaction to something in the blood you were given. Every precaution is taken to prevent this. The decision to have a blood transfusion has been considered carefully by your caregiver before blood is given. Blood is not given unless the benefits outweigh the risks. AFTER THE TRANSFUSION  Right after receiving a blood transfusion, you will usually feel much better and more energetic. This is especially true if your red blood cells have gotten low (anemic). The transfusion raises the level of the red blood cells which carry oxygen, and this usually causes an energy increase.  The nurse administering the transfusion will monitor you carefully for complications. HOME CARE INSTRUCTIONS  No special instructions are needed after a transfusion. You may find your energy is better. Speak with your caregiver about any limitations on activity for underlying diseases you may have. SEEK MEDICAL CARE IF:   Your condition is not improving after your transfusion.  You develop redness or irritation at the intravenous (IV) site. SEEK IMMEDIATE MEDICAL CARE IF:  Any of the following symptoms occur over the next 12 hours:  Shaking chills.  You have a temperature by mouth above 102 F (38.9 C), not controlled by medicine.  Chest, back, or muscle pain.  People around you feel you are not acting correctly or are  confused.  Shortness of breath or difficulty breathing.  Dizziness and fainting.  You get a rash or develop hives.  You have a decrease in urine output.  Your urine turns a dark color or changes to pink, red, or brown. Any of the following symptoms occur over the next 10 days:  You have a temperature by mouth above 102 F (38.9 C), not controlled by medicine.  Shortness of breath.  Weakness after normal activity.  The white part of the eye turns yellow (jaundice).  You have a decrease in the amount of urine or are urinating less often.  Your urine turns a dark color or changes to pink, red, or brown. Document Released: 09/21/2000 Document Revised: 12/17/2011 Document Reviewed: 05/10/2008 [facility] Patient Information 2014 [place], [place].  _______________________________________________________________________

## 2019-06-10 ENCOUNTER — Encounter (HOSPITAL_COMMUNITY)
Admission: RE | Admit: 2019-06-10 | Discharge: 2019-06-10 | Disposition: A | Discharge status: Home / Self Care | Payer: Medicare Other | Source: Ambulatory Visit | Attending: Orthopedic Surgery | Admitting: Orthopedic Surgery

## 2019-06-10 ENCOUNTER — Other Ambulatory Visit: Payer: Self-pay

## 2019-06-10 ENCOUNTER — Encounter (HOSPITAL_COMMUNITY): Payer: Self-pay

## 2019-06-10 DIAGNOSIS — M1711 Unilateral primary osteoarthritis, right knee: Secondary | ICD-10-CM | POA: Diagnosis not present

## 2019-06-10 DIAGNOSIS — M810 Age-related osteoporosis without current pathological fracture: Secondary | ICD-10-CM | POA: Diagnosis not present

## 2019-06-10 DIAGNOSIS — Z01812 Encounter for preprocedural laboratory examination: Secondary | ICD-10-CM | POA: Diagnosis not present

## 2019-06-10 DIAGNOSIS — E2839 Other primary ovarian failure: Secondary | ICD-10-CM | POA: Diagnosis not present

## 2019-06-10 DIAGNOSIS — Z1231 Encounter for screening mammogram for malignant neoplasm of breast: Secondary | ICD-10-CM | POA: Diagnosis not present

## 2019-06-10 HISTORY — DX: Personal history of other medical treatment: Z92.89

## 2019-06-10 HISTORY — DX: Unspecified malignant neoplasm of skin, unspecified: C44.90

## 2019-06-10 HISTORY — DX: Metabolic encephalopathy: G93.41

## 2019-06-10 HISTORY — DX: Unspecified osteoarthritis, unspecified site: M19.90

## 2019-06-10 LAB — COMPREHENSIVE METABOLIC PANEL
ALT: 15 U/L (ref 0–44)
AST: 24 U/L (ref 15–41)
Albumin: 3.9 g/dL (ref 3.5–5.0)
Alkaline Phosphatase: 80 U/L (ref 38–126)
Anion gap: 7 (ref 5–15)
BUN: 5 mg/dL — ABNORMAL LOW (ref 8–23)
CO2: 29 mmol/L (ref 22–32)
Calcium: 9.3 mg/dL (ref 8.9–10.3)
Chloride: 101 mmol/L (ref 98–111)
Creatinine, Ser: 0.58 mg/dL (ref 0.44–1.00)
GFR calc Af Amer: >60 mL/min (ref >60)
GFR calc non Af Amer: >60 mL/min (ref >60)
Glucose, Bld: 92 mg/dL (ref 70–99)
Potassium: 4 mmol/L (ref 3.5–5.1)
Sodium: 137 mmol/L (ref 135–145)
Total Bilirubin: 0.5 mg/dL (ref 0.3–1.2)
Total Protein: 6.6 g/dL (ref 6.5–8.1)

## 2019-06-10 LAB — CBC
HCT: 35.6 % — ABNORMAL LOW (ref 36.0–46.0)
Hemoglobin: 12.2 g/dL (ref 12.0–15.0)
MCH: 32.4 pg (ref 26.0–34.0)
MCHC: 34.3 g/dL (ref 30.0–36.0)
MCV: 94.7 fL (ref 80.0–100.0)
Platelets: 201 10*3/uL (ref 150–400)
RBC: 3.76 MIL/uL — ABNORMAL LOW (ref 3.87–5.11)
RDW: 12 % (ref 11.5–15.5)
WBC: 5.9 10*3/uL (ref 4.0–10.5)
nRBC: 0 % (ref 0.0–0.2)

## 2019-06-10 LAB — PROTIME-INR
INR: 1 (ref 0.8–1.2)
Prothrombin Time: 13.2 seconds (ref 11.4–15.2)

## 2019-06-10 LAB — SURGICAL PCR SCREEN
MRSA, PCR: NEGATIVE
Staphylococcus aureus: NEGATIVE

## 2019-06-10 LAB — APTT: aPTT: 30 seconds (ref 24–36)

## 2019-06-10 NOTE — Progress Notes (Signed)
SPOKE W/  Karenna     SCREENING SYMPTOMS OF COVID 19:   COUGH--NO  RUNNY NOSE--- NO  SORE THROAT---NO  NASAL CONGESTION----NO  SNEEZING----NO  SHORTNESS OF BREATH---NO  DIFFICULTY BREATHING---NO  TEMP >100.0 -----NO  UNEXPLAINED BODY ACHES------NO  CHILLS -------- NO  HEADACHES ---------NO  LOSS OF SMELL/ TASTE --------NO    HAVE YOU OR ANY FAMILY MEMBER TRAVELLED PAST 14 DAYS OUT OF THE   COUNTY---Lives in [facility] STATE----NO COUNTRY----NO  HAVE YOU OR ANY FAMILY MEMBER BEEN EXPOSED TO ANYONE WITH COVID 19? NO

## 2019-06-10 NOTE — Progress Notes (Addendum)
PCP - Dr. Dante Gang [place] Cardiologist - N/A  Chest x-ray - 08/2018 in epic EKG - 08/2018 in epic Stress Test - N/A ECHO -08/2018 in epic  Cardiac Cath - N/A  Sleep Study - N/A CPAP - N/A  Fasting Blood Sugar - N/A Checks Blood Sugar _N/A____ times a day  Blood Thinner Instructions:N/A Aspirin Instructions: Instructed Claire West to call Dr. Anne Fu office for instructions Last Dose:  Anesthesia review: BUN 5 06/10/2019, Awaiting clearance from Dr. Hilma Favors, previous one on chart is incomplete, spoke with Claiborne Billings and made her aware.  Patient denies shortness of breath, fever, cough and chest pain at PAT appointment   Patient verbalized understanding of instructions that were given to them at the PAT appointment. Patient was also instructed that they will need to review over the PAT instructions again at home before surgery.

## 2019-06-12 ENCOUNTER — Ambulatory Visit (HOSPITAL_COMMUNITY): Payer: Medicare Other

## 2019-06-12 ENCOUNTER — Ambulatory Visit (HOSPITAL_COMMUNITY)
Admission: RE | Admit: 2019-06-12 | Discharge: 2019-06-12 | Disposition: A | Discharge status: Home / Self Care | Payer: Medicare Other | Source: Ambulatory Visit | Attending: Family Medicine | Admitting: Family Medicine

## 2019-06-12 ENCOUNTER — Other Ambulatory Visit: Payer: Self-pay

## 2019-06-12 DIAGNOSIS — M810 Age-related osteoporosis without current pathological fracture: Secondary | ICD-10-CM | POA: Insufficient documentation

## 2019-06-12 DIAGNOSIS — Z1231 Encounter for screening mammogram for malignant neoplasm of breast: Secondary | ICD-10-CM

## 2019-06-12 DIAGNOSIS — M1711 Unilateral primary osteoarthritis, right knee: Secondary | ICD-10-CM | POA: Diagnosis not present

## 2019-06-12 DIAGNOSIS — Z01812 Encounter for preprocedural laboratory examination: Secondary | ICD-10-CM | POA: Diagnosis not present

## 2019-06-12 DIAGNOSIS — E2839 Other primary ovarian failure: Secondary | ICD-10-CM | POA: Diagnosis not present

## 2019-06-17 ENCOUNTER — Ambulatory Visit (INDEPENDENT_AMBULATORY_CARE_PROVIDER_SITE_OTHER): Payer: Medicare Other | Admitting: Adult Health

## 2019-06-17 ENCOUNTER — Other Ambulatory Visit: Payer: Self-pay

## 2019-06-17 ENCOUNTER — Encounter: Payer: Self-pay | Admitting: Adult Health

## 2019-06-17 VITALS — BP 134/77 | HR 87 | Temp 97.5°F | Ht 66.0 in | Wt 145.0 lb

## 2019-06-17 DIAGNOSIS — R269 Unspecified abnormalities of gait and mobility: Secondary | ICD-10-CM | POA: Diagnosis not present

## 2019-06-17 DIAGNOSIS — R413 Other amnesia: Secondary | ICD-10-CM

## 2019-06-17 NOTE — Progress Notes (Signed)
PATIENT: Claire West DOB: 10-26-1940  REASON FOR VISIT: follow up HISTORY FROM: patient  HISTORY OF PRESENT ILLNESS:  Today 06/17/19  Ms. Jeschke is a 78 year old female with a history of memory disturbance and gait abnormality.  She returns today for follow-up.  The patient feels that her memory has remained stable.  She is here today with a fcaregiver.  They both feel that there is been some changes with her memory.  She has some good days and bad days.  They report that she is often repetitive and does not recall conversations.  The patient does require some assistance with ADLs.  Her son manages her finances.  She does not do any cooking.  She does plan to have knee replacement surgery next week.  Gait has remained stable.  Caregiver reports that she did have a fall approximately 3 weeks ago but did not fall to the ground.  She did hit her face on her walker.  She uses a walker at all times.  They do not feel that her gait has changed significantly since her virtual visit in May.  She returns today for an evaluation.  HISTORY (Copied from Dr.Dohmeier's note)  patient presented with impaired balance and frequent falls in January  2020.  She is doing better, walking with improved  Stability since January- no falls since January!  Physical therapy has helped. Finished the course.  All meds are the same.  Her voice is clear.  She reports no problem with memory or alertness. Her nursing aid is with her. She rises at 8, and have breakfast at 9 AM,  Walks each day outside with assistance- cane . Lunch time at 13 hours and naps after that. Nap is refreshing, not confused upon arousal.  Dinner time 6 PM and bedtime is 9.30 PM.  She spends 10 hours in bed, sleeps about 7-8 hours.  No vision loss, no hearing loss.   REVIEW OF SYSTEMS: Out of a complete 14 system review of symptoms, the patient complains only of the following symptoms, and all other reviewed systems are negative.  See HPI   ALLERGIES: Allergies  Allergen Reactions  . Penicillins Anaphylaxis, Swelling and Rash    Oral rash/peeling Did it involve swelling of the face/tongue/throat, SOB, or low BP? Yes Did it involve sudden or severe rash/hives, skin peeling, or any reaction on the inside of your mouth or nose? Yes Did you need to seek medical attention at a hospital or doctor's office? Yes When did it last happen? Approximately 2017 If all above answers are "NO", may proceed with cephalosporin use.    . Adhesive [Tape] Itching and Rash    Irritation at site  . Codeine Hives and Rash  . Neosporin [Neomycin-Bacitracin Zn-Polymyx] Rash  . Sulfa Antibiotics Hives and Rash    HOME MEDICATIONS: Outpatient Medications Prior to Visit  Medication Sig Dispense Refill  . acetaminophen (TYLENOL) 500 MG tablet Take 500 mg by mouth at bedtime.     Marland Kitchen amLODipine (NORVASC) 5 MG tablet Take 2.5 mg by mouth at bedtime.     Marland Kitchen aspirin EC 81 MG tablet Take 81 mg by mouth daily.    . cholecalciferol (VITAMIN D3) 25 MCG (1000 UT) tablet Take 1,000 Units by mouth daily.    Marland Kitchen escitalopram (LEXAPRO) 10 MG tablet Take 10 mg by mouth at bedtime.  1  . fexofenadine (ALLEGRA) 180 MG tablet Take 180 mg by mouth daily as needed for allergies.     Marland Kitchen  hydrocortisone cream 1 % Apply 1 application topically 2 (two) times daily as needed for itching.    Javier Docker Oil 500 MG CAPS Take 500 mg by mouth daily at 12 noon.    . Melatonin 5 MG TABS Take 5 mg by mouth at bedtime as needed (sleep).    . metoprolol succinate (TOPROL-XL) 100 MG 24 hr tablet Take 100 mg by mouth daily. Take with or immediately following a meal.     . Multiple Vitamin (MULTIVITAMIN WITH MINERALS) TABS tablet Take 1 tablet by mouth daily at 12 noon. Women's Multivitamin    . omeprazole (PRILOSEC) 20 MG capsule Take 20-40 mg by mouth See admin instructions. Take 40mg  in the AM and 20mg  in the PM.    . potassium chloride (K-DUR) 10 MEQ tablet Take 1 tablet (10 mEq  total) by mouth daily. (Patient taking differently: Take 10 mEq by mouth daily with supper. ) 5 tablet 0  . pravastatin (PRAVACHOL) 20 MG tablet Take 20 mg by mouth at bedtime.     . Probiotic Product (PROBIOTIC PO) Take 1 capsule by mouth daily at 12 noon. [place]    . triamcinolone cream (KENALOG) 0.1 % Apply 1 application topically 2 (two) times daily as needed (itching).    . TURMERIC PO Take 1 tablet by mouth daily at 12 noon.     No facility-administered medications prior to visit.     PAST MEDICAL HISTORY: Past Medical History:  Diagnosis Date  . Arthritis   . Depression   . Gait instability    walks with cane  . GERD (gastroesophageal reflux disease)   . History of blood transfusion   . Hyperlipemia   . Hypertension   . Knee pain   . Metabolic encephalopathy   . Mild cognitive impairment with memory loss   . Seasonal allergies   . Skin cancer     PAST SURGICAL HISTORY: Past Surgical History:  Procedure Laterality Date  . ABDOMINAL HYSTERECTOMY    . ARTHROSCOPIC REPAIR ACL Left   . COLONOSCOPY    . COLONOSCOPY N/A 11/23/2015   Procedure: COLONOSCOPY;  Surgeon: Rogene Houston, MD;  Location: [facility];  Service: Endoscopy;  Laterality: N/A;  830  . EYE SURGERY Bilateral    Cataracts  . SKIN CANCER EXCISION     Right shoulder  . TOTAL KNEE ARTHROPLASTY Left 05/05/2018   Procedure: LEFT TOTAL KNEE ARTHROPLASTY;  Surgeon: Gaynelle Arabian, MD;  Location: [facility];  Service: Orthopedics;  Laterality: Left;  with block    FAMILY HISTORY: Family History  Problem Relation Age of Onset  . Lung cancer Mother   . Lung cancer Sister     SOCIAL HISTORY: Social History   Socioeconomic History  . Marital status: Married    Spouse name: Not on file  . Number of children: Not on file  . Years of education: Not on file  . Highest education level: Not on file  Occupational History  . Not on file  Social Needs  . Financial resource strain: Not on file  . Food  insecurity    Worry: Not on file    Inability: Not on file  . Transportation needs    Medical: Not on file    Non-medical: Not on file  Tobacco Use  . Smoking status: Never Smoker  . Smokeless tobacco: Never Used  Substance and Sexual Activity  . Alcohol use: Yes    Comment: occ  . Drug use: No  .  Sexual activity: Never    Birth control/protection: Surgical  Lifestyle  . Physical activity    Days per week: Not on file    Minutes per session: Not on file  . Stress: Not on file  Relationships  . Social Herbalist on phone: Not on file    Gets together: Not on file    Attends religious service: Not on file    Active member of club or organization: Not on file    Attends meetings of clubs or organizations: Not on file    Relationship status: Not on file  . Intimate partner violence    Fear of current or ex partner: Not on file    Emotionally abused: Not on file    Physically abused: Not on file    Forced sexual activity: Not on file  Other Topics Concern  . Not on file  Social History Narrative  . Not on file      PHYSICAL EXAM  Vitals:   06/17/19 1116  BP: 134/77  Pulse: 87  Temp: (!) 97.5 F (36.4 C)  TempSrc: Oral  Weight: 145 lb (65.8 kg)  Height: 5\' 6"  (1.676 m)   Body mass index is 23.4 kg/m.   Montreal Cognitive Assessment  07/08/2018 07/07/2018  Visuospatial/ Executive (0/5) 4 5  Naming (0/3) 3 3  Attention: Read list of digits (0/2) 2 2  Attention: Read list of letters (0/1) 1 1  Attention: Serial 7 subtraction starting at 100 (0/3) 1 0  Language: Repeat phrase (0/2) 2 2  Language : Fluency (0/1) 0 0  Abstraction (0/2) 0 0  Delayed Recall (0/5) 2 2  Orientation (0/6) 6 6  Total 21 21   MMSE - Mini Mental State Exam 06/17/2019 07/07/2018 12/24/2017  Not completed: (No Data) - -  Orientation to time 5 4 4   Orientation to Place 5 5 4   Registration 3 3 3   Attention/ Calculation 4 3 4   Recall 1 3 2   Language- name 2 objects 2 2 1    Language- repeat 1 1 1   Language- follow 3 step command 3 3 3   Language- read & follow direction 1 1 1   Write a sentence 1 1 1   Copy design 1 1 0  Total score 27 27 24      Generalized: Well developed, in no acute distress   Neurological examination  Mentation: Alert oriented to time, place, history taking. Follows all commands speech and language fluent Cranial nerve II-XII: Pupils were equal round reactive to light. Extraocular movements were full, visual field were full on confrontational test. Head turning and shoulder shrug  were normal and symmetric. Motor: The motor testing reveals 5 over 5 strength of all 4 extremities. Good symmetric motor tone is noted throughout.  Sensory: Sensory testing is intact to soft touch on all 4 extremities. No evidence of extinction is noted.  Coordination: Cerebellar testing reveals good finger-nose-finger and heel-to-shin bilaterally.  Gait and station: Patient uses a walker when ambulating.   DIAGNOSTIC DATA (LABS, IMAGING, TESTING) - I reviewed patient records, labs, notes, testing and imaging myself where available.  Lab Results  Component Value Date   WBC 5.9 06/10/2019   HGB 12.2 06/10/2019   HCT 35.6 (L) 06/10/2019   MCV 94.7 06/10/2019   PLT 201 06/10/2019      Component Value Date/Time   NA 137 06/10/2019 1410   K 4.0 06/10/2019 1410   CL 101 06/10/2019 1410   CO2 29 06/10/2019 1410  GLUCOSE 92 06/10/2019 1410   BUN 5 (L) 06/10/2019 1410   CREATININE 0.58 06/10/2019 1410   CALCIUM 9.3 06/10/2019 1410   PROT 6.6 06/10/2019 1410   ALBUMIN 3.9 06/10/2019 1410   AST 24 06/10/2019 1410   ALT 15 06/10/2019 1410   ALKPHOS 80 06/10/2019 1410   BILITOT 0.5 06/10/2019 1410   GFRNONAA >60 06/10/2019 1410   GFRAA >60 06/10/2019 1410   Lab Results  Component Value Date   CHOL 186 08/23/2018   HDL 35 (L) 08/23/2018   LDLCALC 117 (H) 08/23/2018   TRIG 170 (H) 08/23/2018   CHOLHDL 5.3 08/23/2018   Lab Results  Component Value  Date   HGBA1C 5.0 08/23/2018   Lab Results  Component Value Date   VITAMINB12 1,006 (H) 11/06/2017   Lab Results  Component Value Date   TSH 0.942 11/06/2017      ASSESSMENT AND PLAN 78 y.o. year old female  has a past medical history of Arthritis, Depression, Gait instability, GERD (gastroesophageal reflux disease), History of blood transfusion, Hyperlipemia, Hypertension, Knee pain, Metabolic encephalopathy, Mild cognitive impairment with memory loss, Seasonal allergies, and Skin cancer. here with :   1.  Memory disturbance 2.  Gait abnormality  The patient's memory score has remained stable.  We will continue to monitor for now.  I did advise that if they are noticing changes with memory at home we could consider neuropsychological testing.  They have deferred until after her knee surgery.  The patient is encouraged to use her walker at all times.  She is advised that if her symptoms worsen or she develops new symptoms they should let us know.  She will follow-up in 6 months or sooner if needed.      Ward Givens, MSN, NP-C 06/17/2019, 11:22 AM [facility] [address], [place], [place] [address] [phone]

## 2019-06-17 NOTE — Patient Instructions (Addendum)
Your Plan:  Continue to monitor memory  Memory score is stable today Call if you want to pursue neuropsychological testing If your symptoms worsen or you develop new symptoms please let us know.   Thank you for coming to see [place] at [facility]. I hope we have been able to provide you high quality care today.  You may receive a patient satisfaction survey over the next few weeks. We would appreciate your feedback and comments so that we may continue to improve ourselves and the health of our patients.

## 2019-06-18 ENCOUNTER — Other Ambulatory Visit: Payer: Self-pay

## 2019-06-18 ENCOUNTER — Other Ambulatory Visit (HOSPITAL_COMMUNITY)
Admission: RE | Admit: 2019-06-18 | Discharge: 2019-06-18 | Disposition: A | Discharge status: Home / Self Care | Payer: Medicare Other | Source: Ambulatory Visit | Attending: Orthopedic Surgery | Admitting: Orthopedic Surgery

## 2019-06-18 DIAGNOSIS — Z01812 Encounter for preprocedural laboratory examination: Secondary | ICD-10-CM | POA: Insufficient documentation

## 2019-06-18 DIAGNOSIS — Z20828 Contact with and (suspected) exposure to other viral communicable diseases: Secondary | ICD-10-CM | POA: Diagnosis not present

## 2019-06-18 LAB — SARS CORONAVIRUS 2 (TAT 6-24 HRS): SARS Coronavirus 2: NEGATIVE

## 2019-06-19 NOTE — Progress Notes (Signed)
Called Dr Delanna Ahmadi office in [place]. Received signed preoperative risk assessment form which was signed; however, not completed. Refaxed back to office.

## 2019-06-21 MED ORDER — BUPIVACAINE LIPOSOME 1.3 % IJ SUSP
20.0000 mL | Freq: Once | INTRAMUSCULAR | Status: DC
  Filled 2019-06-21: qty 20

## 2019-06-22 ENCOUNTER — Other Ambulatory Visit: Payer: Self-pay

## 2019-06-22 ENCOUNTER — Inpatient Hospital Stay (HOSPITAL_COMMUNITY): Payer: Medicare Other | Admitting: Physician Assistant

## 2019-06-22 ENCOUNTER — Encounter (HOSPITAL_COMMUNITY): Payer: Self-pay | Admitting: Anesthesiology

## 2019-06-22 ENCOUNTER — Encounter (HOSPITAL_COMMUNITY)
Admission: RE | Disposition: A | Discharge status: Home / Self Care | Payer: Self-pay | Source: Home / Self Care | Attending: Orthopedic Surgery

## 2019-06-22 ENCOUNTER — Inpatient Hospital Stay (HOSPITAL_COMMUNITY): Payer: Medicare Other

## 2019-06-22 ENCOUNTER — Inpatient Hospital Stay (HOSPITAL_COMMUNITY)
Admission: RE | Admit: 2019-06-22 | Discharge: 2019-06-26 | DRG: 469 | Disposition: A | Discharge status: Home / Self Care | Payer: Medicare Other | Attending: Orthopedic Surgery | Admitting: Orthopedic Surgery

## 2019-06-22 DIAGNOSIS — E785 Hyperlipidemia, unspecified: Secondary | ICD-10-CM | POA: Diagnosis present

## 2019-06-22 DIAGNOSIS — Z8673 Personal history of transient ischemic attack (TIA), and cerebral infarction without residual deficits: Secondary | ICD-10-CM | POA: Diagnosis not present

## 2019-06-22 DIAGNOSIS — I517 Cardiomegaly: Secondary | ICD-10-CM | POA: Diagnosis not present

## 2019-06-22 DIAGNOSIS — G8918 Other acute postprocedural pain: Secondary | ICD-10-CM | POA: Diagnosis not present

## 2019-06-22 DIAGNOSIS — M179 Osteoarthritis of knee, unspecified: Secondary | ICD-10-CM | POA: Diagnosis present

## 2019-06-22 DIAGNOSIS — Z79899 Other long term (current) drug therapy: Secondary | ICD-10-CM

## 2019-06-22 DIAGNOSIS — Z880 Allergy status to penicillin: Secondary | ICD-10-CM

## 2019-06-22 DIAGNOSIS — Z885 Allergy status to narcotic agent status: Secondary | ICD-10-CM

## 2019-06-22 DIAGNOSIS — K219 Gastro-esophageal reflux disease without esophagitis: Secondary | ICD-10-CM | POA: Diagnosis present

## 2019-06-22 DIAGNOSIS — F329 Major depressive disorder, single episode, unspecified: Secondary | ICD-10-CM | POA: Diagnosis present

## 2019-06-22 DIAGNOSIS — Z7982 Long term (current) use of aspirin: Secondary | ICD-10-CM

## 2019-06-22 DIAGNOSIS — E876 Hypokalemia: Secondary | ICD-10-CM | POA: Diagnosis present

## 2019-06-22 DIAGNOSIS — Z9842 Cataract extraction status, left eye: Secondary | ICD-10-CM

## 2019-06-22 DIAGNOSIS — Z96653 Presence of artificial knee joint, bilateral: Secondary | ICD-10-CM | POA: Diagnosis present

## 2019-06-22 DIAGNOSIS — R509 Fever, unspecified: Secondary | ICD-10-CM | POA: Diagnosis not present

## 2019-06-22 DIAGNOSIS — F0390 Unspecified dementia without behavioral disturbance: Secondary | ICD-10-CM | POA: Diagnosis present

## 2019-06-22 DIAGNOSIS — E871 Hypo-osmolality and hyponatremia: Secondary | ICD-10-CM | POA: Diagnosis present

## 2019-06-22 DIAGNOSIS — Z882 Allergy status to sulfonamides status: Secondary | ICD-10-CM | POA: Diagnosis not present

## 2019-06-22 DIAGNOSIS — Z9841 Cataract extraction status, right eye: Secondary | ICD-10-CM | POA: Diagnosis not present

## 2019-06-22 DIAGNOSIS — G459 Transient cerebral ischemic attack, unspecified: Secondary | ICD-10-CM | POA: Diagnosis not present

## 2019-06-22 DIAGNOSIS — Z888 Allergy status to other drugs, medicaments and biological substances status: Secondary | ICD-10-CM | POA: Diagnosis not present

## 2019-06-22 DIAGNOSIS — M25761 Osteophyte, right knee: Secondary | ICD-10-CM | POA: Diagnosis present

## 2019-06-22 DIAGNOSIS — G9341 Metabolic encephalopathy: Secondary | ICD-10-CM | POA: Diagnosis not present

## 2019-06-22 DIAGNOSIS — M1710 Unilateral primary osteoarthritis, unspecified knee: Secondary | ICD-10-CM | POA: Diagnosis present

## 2019-06-22 DIAGNOSIS — Z85828 Personal history of other malignant neoplasm of skin: Secondary | ICD-10-CM | POA: Diagnosis not present

## 2019-06-22 DIAGNOSIS — Z90710 Acquired absence of both cervix and uterus: Secondary | ICD-10-CM | POA: Diagnosis not present

## 2019-06-22 DIAGNOSIS — F05 Delirium due to known physiological condition: Secondary | ICD-10-CM | POA: Diagnosis not present

## 2019-06-22 DIAGNOSIS — G3184 Mild cognitive impairment, so stated: Secondary | ICD-10-CM | POA: Diagnosis present

## 2019-06-22 DIAGNOSIS — R410 Disorientation, unspecified: Secondary | ICD-10-CM | POA: Diagnosis not present

## 2019-06-22 DIAGNOSIS — R296 Repeated falls: Secondary | ICD-10-CM | POA: Diagnosis present

## 2019-06-22 DIAGNOSIS — M1711 Unilateral primary osteoarthritis, right knee: Secondary | ICD-10-CM | POA: Diagnosis present

## 2019-06-22 DIAGNOSIS — Z79890 Hormone replacement therapy (postmenopausal): Secondary | ICD-10-CM

## 2019-06-22 DIAGNOSIS — Z801 Family history of malignant neoplasm of trachea, bronchus and lung: Secondary | ICD-10-CM | POA: Diagnosis not present

## 2019-06-22 DIAGNOSIS — R4182 Altered mental status, unspecified: Secondary | ICD-10-CM | POA: Diagnosis not present

## 2019-06-22 DIAGNOSIS — I10 Essential (primary) hypertension: Secondary | ICD-10-CM | POA: Diagnosis present

## 2019-06-22 DIAGNOSIS — R413 Other amnesia: Secondary | ICD-10-CM | POA: Diagnosis present

## 2019-06-22 DIAGNOSIS — F32A Depression, unspecified: Secondary | ICD-10-CM | POA: Diagnosis present

## 2019-06-22 HISTORY — PX: TOTAL KNEE ARTHROPLASTY: SHX125

## 2019-06-22 LAB — TYPE AND SCREEN
ABO/RH(D): O NEG
Antibody Screen: NEGATIVE

## 2019-06-22 SURGERY — ARTHROPLASTY, KNEE, TOTAL
Anesthesia: Spinal | Site: Knee | Laterality: Right

## 2019-06-22 MED ORDER — OXYCODONE HCL 5 MG PO TABS
5.0000 mg | ORAL_TABLET | ORAL | Status: DC | PRN
  Administered 2019-06-22 – 2019-06-23 (×3): 10 mg via ORAL
  Filled 2019-06-22 (×3): qty 2

## 2019-06-22 MED ORDER — METOCLOPRAMIDE HCL 5 MG/ML IJ SOLN: 5.0000 mg | Freq: Three times a day (TID) | INTRAMUSCULAR | Status: DC | PRN

## 2019-06-22 MED ORDER — TRAMADOL HCL 50 MG PO TABS
50.0000 mg | ORAL_TABLET | Freq: Four times a day (QID) | ORAL | Status: DC | PRN
  Administered 2019-06-22 – 2019-06-23 (×2): 100 mg via ORAL
  Administered 2019-06-24: 50 mg via ORAL
  Filled 2019-06-22: qty 1
  Filled 2019-06-22 (×2): qty 2

## 2019-06-22 MED ORDER — FLEET ENEMA 7-19 GM/118ML RE ENEM: 1.0000 | ENEMA | Freq: Once | RECTAL | Status: DC | PRN

## 2019-06-22 MED ORDER — FENTANYL CITRATE (PF) 100 MCG/2ML IJ SOLN: 25.0000 ug | INTRAMUSCULAR | Status: DC | PRN

## 2019-06-22 MED ORDER — FENTANYL CITRATE (PF) 100 MCG/2ML IJ SOLN: 50.0000 ug | INTRAMUSCULAR | Status: DC

## 2019-06-22 MED ORDER — TRANEXAMIC ACID-NACL 1000-0.7 MG/100ML-% IV SOLN
1000.0000 mg | INTRAVENOUS | Status: AC
  Administered 2019-06-22: 1000 mg via INTRAVENOUS
  Filled 2019-06-22: qty 100

## 2019-06-22 MED ORDER — PANTOPRAZOLE SODIUM 40 MG PO TBEC
40.0000 mg | DELAYED_RELEASE_TABLET | Freq: Every morning | ORAL | Status: DC
  Administered 2019-06-23 – 2019-06-26 (×4): 40 mg via ORAL
  Filled 2019-06-22 (×4): qty 1

## 2019-06-22 MED ORDER — LIDOCAINE HCL (CARDIAC) PF 100 MG/5ML IV SOSY
PREFILLED_SYRINGE | INTRAVENOUS | Status: DC | PRN
  Administered 2019-06-22: 20 mg via INTRAVENOUS

## 2019-06-22 MED ORDER — ESCITALOPRAM OXALATE 10 MG PO TABS
10.0000 mg | ORAL_TABLET | Freq: Every day | ORAL | Status: DC
  Administered 2019-06-22 – 2019-06-25 (×4): 10 mg via ORAL
  Filled 2019-06-22 (×4): qty 1

## 2019-06-22 MED ORDER — DOCUSATE SODIUM 100 MG PO CAPS
100.0000 mg | ORAL_CAPSULE | Freq: Two times a day (BID) | ORAL | Status: DC
  Administered 2019-06-22 – 2019-06-26 (×8): 100 mg via ORAL
  Filled 2019-06-22 (×8): qty 1

## 2019-06-22 MED ORDER — ONDANSETRON HCL 4 MG/2ML IJ SOLN: 4.0000 mg | Freq: Once | INTRAMUSCULAR | Status: DC | PRN

## 2019-06-22 MED ORDER — VANCOMYCIN HCL IN DEXTROSE 1-5 GM/200ML-% IV SOLN
1000.0000 mg | Freq: Two times a day (BID) | INTRAVENOUS | Status: AC
  Administered 2019-06-22: 23:00:00 1000 mg via INTRAVENOUS
  Filled 2019-06-22: qty 200

## 2019-06-22 MED ORDER — AMLODIPINE BESYLATE 5 MG PO TABS
2.5000 mg | ORAL_TABLET | Freq: Every day | ORAL | Status: DC
  Administered 2019-06-22 – 2019-06-25 (×4): 2.5 mg via ORAL
  Filled 2019-06-22 (×4): qty 1

## 2019-06-22 MED ORDER — FENTANYL CITRATE (PF) 100 MCG/2ML IJ SOLN
INTRAMUSCULAR | Status: AC
  Filled 2019-06-22: qty 2

## 2019-06-22 MED ORDER — BUPIVACAINE-EPINEPHRINE (PF) 0.5% -1:200000 IJ SOLN
INTRAMUSCULAR | Status: DC | PRN
  Administered 2019-06-22: 30 mL via PERINEURAL

## 2019-06-22 MED ORDER — DEXAMETHASONE SODIUM PHOSPHATE 10 MG/ML IJ SOLN
INTRAMUSCULAR | Status: AC
  Filled 2019-06-22: qty 1

## 2019-06-22 MED ORDER — DEXAMETHASONE SODIUM PHOSPHATE 10 MG/ML IJ SOLN
10.0000 mg | Freq: Once | INTRAMUSCULAR | Status: AC
  Administered 2019-06-23: 10 mg via INTRAVENOUS
  Filled 2019-06-22: qty 1

## 2019-06-22 MED ORDER — ONDANSETRON HCL 4 MG PO TABS: 4.0000 mg | ORAL_TABLET | Freq: Four times a day (QID) | ORAL | Status: DC | PRN

## 2019-06-22 MED ORDER — SODIUM CHLORIDE (PF) 0.9 % IJ SOLN
INTRAMUSCULAR | Status: AC
  Filled 2019-06-22: qty 10

## 2019-06-22 MED ORDER — POTASSIUM CHLORIDE CRYS ER 10 MEQ PO TBCR
10.0000 meq | EXTENDED_RELEASE_TABLET | Freq: Every day | ORAL | Status: DC
  Administered 2019-06-22 – 2019-06-25 (×4): 10 meq via ORAL
  Filled 2019-06-22 (×6): qty 1

## 2019-06-22 MED ORDER — CEFAZOLIN SODIUM-DEXTROSE 2-4 GM/100ML-% IV SOLN: 2.0000 g | INTRAVENOUS | Status: DC

## 2019-06-22 MED ORDER — SODIUM CHLORIDE (PF) 0.9 % IJ SOLN
INTRAMUSCULAR | Status: AC
  Filled 2019-06-22: qty 50

## 2019-06-22 MED ORDER — RIVAROXABAN 10 MG PO TABS
10.0000 mg | ORAL_TABLET | Freq: Every day | ORAL | Status: DC
  Administered 2019-06-23 – 2019-06-26 (×4): 10 mg via ORAL
  Filled 2019-06-22 (×4): qty 1

## 2019-06-22 MED ORDER — LACTATED RINGERS IV SOLN
INTRAVENOUS | Status: DC
  Administered 2019-06-22: 10:00:00 via INTRAVENOUS

## 2019-06-22 MED ORDER — ONDANSETRON HCL 4 MG/2ML IJ SOLN: 4.0000 mg | Freq: Four times a day (QID) | INTRAMUSCULAR | Status: DC | PRN

## 2019-06-22 MED ORDER — BISACODYL 10 MG RE SUPP: 10.0000 mg | Freq: Every day | RECTAL | Status: DC | PRN

## 2019-06-22 MED ORDER — METHOCARBAMOL 500 MG PO TABS
500.0000 mg | ORAL_TABLET | Freq: Four times a day (QID) | ORAL | Status: DC | PRN
  Administered 2019-06-22 – 2019-06-24 (×2): 500 mg via ORAL
  Filled 2019-06-22 (×2): qty 1

## 2019-06-22 MED ORDER — SODIUM CHLORIDE 0.9 % IR SOLN
Status: DC | PRN
  Administered 2019-06-22 (×2): 1000 mL

## 2019-06-22 MED ORDER — SODIUM CHLORIDE (PF) 0.9 % IJ SOLN
INTRAMUSCULAR | Status: DC | PRN
  Administered 2019-06-22: 60 mL

## 2019-06-22 MED ORDER — MENTHOL 3 MG MT LOZG: 1.0000 | LOZENGE | OROMUCOSAL | Status: DC | PRN

## 2019-06-22 MED ORDER — METHOCARBAMOL 500 MG IVPB - SIMPLE MED
500.0000 mg | Freq: Four times a day (QID) | INTRAVENOUS | Status: DC | PRN
  Filled 2019-06-22: qty 50

## 2019-06-22 MED ORDER — LORATADINE 10 MG PO TABS: 10.0000 mg | ORAL_TABLET | Freq: Every day | ORAL | Status: DC | PRN

## 2019-06-22 MED ORDER — METOCLOPRAMIDE HCL 5 MG PO TABS: 5.0000 mg | ORAL_TABLET | Freq: Three times a day (TID) | ORAL | Status: DC | PRN

## 2019-06-22 MED ORDER — ONDANSETRON HCL 4 MG/2ML IJ SOLN
INTRAMUSCULAR | Status: DC | PRN
  Administered 2019-06-22: 4 mg via INTRAVENOUS

## 2019-06-22 MED ORDER — BUPIVACAINE IN DEXTROSE 0.75-8.25 % IT SOLN
INTRATHECAL | Status: DC | PRN
  Administered 2019-06-22: 13 mg via INTRATHECAL

## 2019-06-22 MED ORDER — MORPHINE SULFATE (PF) 2 MG/ML IV SOLN: 1.0000 mg | INTRAVENOUS | Status: DC | PRN

## 2019-06-22 MED ORDER — PROPOFOL 500 MG/50ML IV EMUL
INTRAVENOUS | Status: DC | PRN
  Administered 2019-06-22: 40 ug/kg/min via INTRAVENOUS

## 2019-06-22 MED ORDER — DEXAMETHASONE SODIUM PHOSPHATE 10 MG/ML IJ SOLN
8.0000 mg | Freq: Once | INTRAMUSCULAR | Status: AC
  Administered 2019-06-22: 8 mg via INTRAVENOUS

## 2019-06-22 MED ORDER — SODIUM CHLORIDE 0.9 % IV SOLN
INTRAVENOUS | Status: DC
  Administered 2019-06-22 – 2019-06-25 (×4): via INTRAVENOUS

## 2019-06-22 MED ORDER — POVIDONE-IODINE 10 % EX SWAB: 2.0000 "application " | Freq: Once | CUTANEOUS | Status: DC

## 2019-06-22 MED ORDER — BUPIVACAINE LIPOSOME 1.3 % IJ SUSP
INTRAMUSCULAR | Status: DC | PRN
  Administered 2019-06-22: 20 mL

## 2019-06-22 MED ORDER — VANCOMYCIN HCL IN DEXTROSE 1-5 GM/200ML-% IV SOLN
1000.0000 mg | Freq: Once | INTRAVENOUS | Status: AC
  Administered 2019-06-22: 1000 mg via INTRAVENOUS
  Filled 2019-06-22: qty 200

## 2019-06-22 MED ORDER — METOPROLOL SUCCINATE ER 50 MG PO TB24
100.0000 mg | ORAL_TABLET | Freq: Every day | ORAL | Status: DC
  Administered 2019-06-23 – 2019-06-26 (×4): 100 mg via ORAL
  Filled 2019-06-22 (×4): qty 2

## 2019-06-22 MED ORDER — ACETAMINOPHEN 500 MG PO TABS
1000.0000 mg | ORAL_TABLET | Freq: Four times a day (QID) | ORAL | Status: AC
  Administered 2019-06-22 – 2019-06-23 (×4): 1000 mg via ORAL
  Filled 2019-06-22 (×4): qty 2

## 2019-06-22 MED ORDER — ONDANSETRON HCL 4 MG/2ML IJ SOLN
INTRAMUSCULAR | Status: AC
  Filled 2019-06-22: qty 2

## 2019-06-22 MED ORDER — DIPHENHYDRAMINE HCL 12.5 MG/5ML PO ELIX: 12.5000 mg | ORAL_SOLUTION | ORAL | Status: DC | PRN

## 2019-06-22 MED ORDER — PHENOL 1.4 % MT LIQD: 1.0000 | OROMUCOSAL | Status: DC | PRN

## 2019-06-22 MED ORDER — ACETAMINOPHEN 10 MG/ML IV SOLN
1000.0000 mg | Freq: Four times a day (QID) | INTRAVENOUS | Status: DC
  Administered 2019-06-22: 1000 mg via INTRAVENOUS
  Filled 2019-06-22: qty 100

## 2019-06-22 MED ORDER — MIDAZOLAM HCL 2 MG/2ML IJ SOLN
INTRAMUSCULAR | Status: AC
  Filled 2019-06-22: qty 2

## 2019-06-22 MED ORDER — PROPOFOL 10 MG/ML IV BOLUS
INTRAVENOUS | Status: AC
  Filled 2019-06-22: qty 40

## 2019-06-22 MED ORDER — PANTOPRAZOLE SODIUM 40 MG PO TBEC
80.0000 mg | DELAYED_RELEASE_TABLET | Freq: Every evening | ORAL | Status: DC
  Administered 2019-06-22 – 2019-06-25 (×4): 80 mg via ORAL
  Filled 2019-06-22 (×4): qty 2

## 2019-06-22 MED ORDER — CHLORHEXIDINE GLUCONATE 4 % EX LIQD: 60.0000 mL | Freq: Once | CUTANEOUS | Status: DC

## 2019-06-22 MED ORDER — FENTANYL CITRATE (PF) 100 MCG/2ML IJ SOLN
INTRAMUSCULAR | Status: AC
  Administered 2019-06-22: 100 ug
  Filled 2019-06-22: qty 2

## 2019-06-22 MED ORDER — PRAVASTATIN SODIUM 20 MG PO TABS
20.0000 mg | ORAL_TABLET | Freq: Every day | ORAL | Status: DC
  Administered 2019-06-22 – 2019-06-25 (×4): 20 mg via ORAL
  Filled 2019-06-22 (×4): qty 1

## 2019-06-22 MED ORDER — POLYETHYLENE GLYCOL 3350 17 G PO PACK: 17.0000 g | PACK | Freq: Every day | ORAL | Status: DC | PRN

## 2019-06-22 SURGICAL SUPPLY — 58 items
BAG SPEC THK2 15X12 ZIP CLS (MISCELLANEOUS) ×1
BAG ZIPLOCK 12X15 (MISCELLANEOUS) ×3 IMPLANT
BLADE SAG 18X100X1.27 (BLADE) ×3 IMPLANT
BLADE SAW SGTL 11.0X1.19X90.0M (BLADE) ×3 IMPLANT
BLADE SURG SZ10 CARB STEEL (BLADE) ×6 IMPLANT
BNDG ELASTIC 6X5.8 VLCR STR LF (GAUZE/BANDAGES/DRESSINGS) ×3 IMPLANT
BOWL SMART MIX CTS (DISPOSABLE) ×3 IMPLANT
CEMENT HV SMART SET (Cement) ×6 IMPLANT
CEMENT TIBIA MBT (Knees) IMPLANT
CLOSURE WOUND 1/2 X4 (GAUZE/BANDAGES/DRESSINGS) ×2
COVER SURGICAL LIGHT HANDLE (MISCELLANEOUS) ×3 IMPLANT
COVER WAND RF STERILE (DRAPES) IMPLANT
CUFF TOURN SGL QUICK 34 (TOURNIQUET CUFF) ×3
CUFF TRNQT CYL 34X4.125X (TOURNIQUET CUFF) ×1 IMPLANT
DECANTER SPIKE VIAL GLASS SM (MISCELLANEOUS) ×5 IMPLANT
DRAPE U-SHAPE 47X51 STRL (DRAPES) ×3 IMPLANT
DRSG ADAPTIC 3X8 NADH LF (GAUZE/BANDAGES/DRESSINGS) ×3 IMPLANT
DRSG PAD ABDOMINAL 8X10 ST (GAUZE/BANDAGES/DRESSINGS) ×3 IMPLANT
DURAPREP 26ML APPLICATOR (WOUND CARE) ×3 IMPLANT
ELECT REM PT RETURN 15FT ADLT (MISCELLANEOUS) ×3 IMPLANT
EVACUATOR 1/8 PVC DRAIN (DRAIN) ×3 IMPLANT
FEMUR SIGMA PS SZ 3.0 R (Femur) ×2 IMPLANT
GAUZE SPONGE 4X4 12PLY STRL (GAUZE/BANDAGES/DRESSINGS) ×3 IMPLANT
GLOVE BIO SURGEON STRL SZ7 (GLOVE) ×3 IMPLANT
GLOVE BIO SURGEON STRL SZ8 (GLOVE) ×3 IMPLANT
GLOVE BIOGEL PI IND STRL 6.5 (GLOVE) ×1 IMPLANT
GLOVE BIOGEL PI IND STRL 7.0 (GLOVE) ×1 IMPLANT
GLOVE BIOGEL PI IND STRL 8 (GLOVE) ×1 IMPLANT
GLOVE BIOGEL PI INDICATOR 6.5 (GLOVE) ×2
GLOVE BIOGEL PI INDICATOR 7.0 (GLOVE) ×2
GLOVE BIOGEL PI INDICATOR 8 (GLOVE) ×2
GLOVE SURG SS PI 6.5 STRL IVOR (GLOVE) ×3 IMPLANT
GOWN STRL REUS W/TWL LRG LVL3 (GOWN DISPOSABLE) ×9 IMPLANT
HANDPIECE INTERPULSE COAX TIP (DISPOSABLE) ×3
HOLDER FOLEY CATH W/STRAP (MISCELLANEOUS) ×2 IMPLANT
IMMOBILIZER KNEE 20 (SOFTGOODS) ×3
IMMOBILIZER KNEE 20 THIGH 36 (SOFTGOODS) ×1 IMPLANT
INSERT TIBIAL PFC SIG SZ3 10MM (Knees) ×2 IMPLANT
KIT TURNOVER KIT A (KITS) IMPLANT
MANIFOLD NEPTUNE II (INSTRUMENTS) ×3 IMPLANT
NS IRRIG 1000ML POUR BTL (IV SOLUTION) ×3 IMPLANT
PACK TOTAL KNEE CUSTOM (KITS) ×3 IMPLANT
PADDING CAST COTTON 6X4 STRL (CAST SUPPLIES) ×7 IMPLANT
PATELLA DOME PFC 35MM (Knees) ×2 IMPLANT
PIN STEINMAN FIXATION KNEE (PIN) ×2 IMPLANT
PROTECTOR NERVE ULNAR (MISCELLANEOUS) ×3 IMPLANT
SET HNDPC FAN SPRY TIP SCT (DISPOSABLE) ×1 IMPLANT
STRIP CLOSURE SKIN 1/2X4 (GAUZE/BANDAGES/DRESSINGS) ×4 IMPLANT
SUT MNCRL AB 4-0 PS2 18 (SUTURE) ×3 IMPLANT
SUT STRATAFIX 0 PDS 27 VIOLET (SUTURE) ×3
SUT VIC AB 2-0 CT1 27 (SUTURE) ×9
SUT VIC AB 2-0 CT1 TAPERPNT 27 (SUTURE) ×3 IMPLANT
SUTURE STRATFX 0 PDS 27 VIOLET (SUTURE) ×1 IMPLANT
TIBIA MBT CEMENT (Knees) ×3 IMPLANT
TRAY FOLEY MTR SLVR 14FR STAT (SET/KITS/TRAYS/PACK) ×2 IMPLANT
WATER STERILE IRR 1000ML POUR (IV SOLUTION) ×6 IMPLANT
WRAP KNEE MAXI GEL POST OP (GAUZE/BANDAGES/DRESSINGS) ×3 IMPLANT
YANKAUER SUCT BULB TIP 10FT TU (MISCELLANEOUS) ×3 IMPLANT

## 2019-06-22 NOTE — Care Plan (Signed)
Ortho Bundle Case Management Note  Patient Details  Name: Claire West MRN: [id number] Date of Birth: 1940-10-29  R TKA on 06-22-19 DCP: Home with son and sister.  2 story home with 3 ste. DME:  No needs.  Has a RW, 3-in-1, and chair lift. PT:  [facility].  PT eval scheduled on 06-26-19                   DME Arranged:  N/A DME Agency:  NA  HH Arranged:  NA HH Agency:  NA  Additional Comments: Please contact me with any questions of if this plan should need to change.  Marianne Sofia, RN,CCM [facility]  [phone] 06/22/2019, 4:10 PM

## 2019-06-22 NOTE — Anesthesia Procedure Notes (Addendum)
Anesthesia Regional Block: Adductor canal block   Pre-Anesthetic Checklist: ,, timeout performed, Correct Patient, Correct Site, Correct Laterality, Correct Procedure, Correct Position, site marked, Risks and benefits discussed,  Surgical consent,  Pre-op evaluation,  At surgeon's request and post-op pain management  Laterality: Right  Prep: chloraprep       Needles:  Injection technique: Single-shot  Needle Type: Echogenic Stimulator Needle     Needle Length: 9cm  Needle Gauge: 21   Needle insertion depth: 6 cm   Additional Needles:   Procedures:,,,, ultrasound used (permanent image in chart),,,,  Narrative:  Start time: 06/22/2019 10:02 AM End time: 06/22/2019 10:06 AM Injection made incrementally with aspirations every 5 mL.  Performed by: Personally  Anesthesiologist: Josephine Igo, MD  Additional Notes: Timeout performed. Patient sedated. Relevant anatomy ID'd using [place]. Incremental 2-26ml injection of LA with frequent aspiration. Patient tolerated procedure well.        Right Adductor Canal Block

## 2019-06-22 NOTE — Evaluation (Signed)
Physical Therapy Evaluation Patient Details Name: Claire West MRN: [id number] DOB: 07-25-1941 Today's Date: 06/22/2019   History of Present Illness  78 y.o. female s/p R TKR on 06/22/19. PMH includes multiple falls, TIA, OA, MCI, hypertension, depression.  Clinical Impression  Pt presents with severe R knee pain, decreased R knee ROM and strength post-operatively, difficulty performing mobility tasks, initial unsteadiness in standing with history significant for multiple falls, and decreased activity tolerance. Pt to benefit from acute PT to address deficits. Pt ambulated short room distance with RW and min assist for steadying, pt limited by severe R knee pain. Pt educated on ankle pumps (20/hour) to perform this afternoon/evening to increase circulation, to pt's tolerance and limited by pain. Pt plans to d/c home in 3 days, with 24/7 assist from family and personal care attendant. PT to progress mobility as tolerated, and will continue to follow acutely.        Follow Up Recommendations Follow surgeon's recommendation for DC plan and follow-up therapies;Supervision for mobility/OOB(OPPT scheduled for 9/18)    Equipment Recommendations  None recommended by PT    Recommendations for Other Services       Precautions / Restrictions Precautions Precautions: Fall Required Braces or Orthoses: Knee Immobilizer - Right Knee Immobilizer - Right: On when out of bed or walking;Discontinue once straight leg raise with < 10 degree lag Restrictions Weight Bearing Restrictions: No Other Position/Activity Restrictions: WBAT      Mobility  Bed Mobility Overal bed mobility: Needs Assistance Bed Mobility: Supine to Sit     Supine to sit: Mod assist;+2 for physical assistance;HOB elevated     General bed mobility comments: Mod assist +2 for LE lifting and translation to edge of bed, trunk elevation, and scooting to EOB with use of bed pads. Increased time and effort.  Transfers Overall  transfer level: Needs assistance Equipment used: Rolling walker (2 wheeled) Transfers: Sit to/from Stand Sit to Stand: From elevated surface;+2 safety/equipment;Min assist         General transfer comment: Min assist for initial power up, steadying. Pt initially with posterior leaning but pt able to find center of mass with sustained standing. Pt able to weight shift L and R without difficulty, as well as take steps forward and back with bilateral LEs. Verbal cuing for hand placement when rising.  Ambulation/Gait Ambulation/Gait assistance: Min assist;+2 physical assistance;+2 safety/equipment Gait Distance (Feet): 6 Feet Assistive device: Rolling walker (2 wheeled) Gait Pattern/deviations: Step-to pattern;Step-through pattern;Decreased step length - right;Decreased step length - left;Decreased weight shift to right;Decreased stance time - right;Antalgic;Trunk flexed Gait velocity: decr   General Gait Details: Min assist for steadying, RW navigation. Verbal cuing for sequencing with step-to gait, placement in RW. Pt limited by pain, PT and pt terminated further ambulation.  Stairs            Wheelchair Mobility    Modified Rankin (Stroke Patients Only)       Balance Overall balance assessment: Needs assistance;History of Falls Sitting-balance support: No upper extremity supported;Feet supported Sitting balance-Leahy Scale: Fair     Standing balance support: Bilateral upper extremity supported Standing balance-Leahy Scale: Poor Standing balance comment: reliant on external support in standing                             Pertinent Vitals/Pain Pain Assessment: 0-10 Pain Score: 8  Pain Location: R knee Pain Descriptors / Indicators: Sore;Burning Pain Intervention(s): Limited activity within patient's tolerance;Monitored during  session;Repositioned;Patient requesting pain meds-RN notified;Ice applied    Home Living Family/patient expects to be discharged  to:: Private residence Living Arrangements: Other relatives Available Help at Discharge: Personal care attendant;Available 24 hours/day;Family(has had 24/7 assist since last TKR in 04/2018; pt's sister, other family members, and a personal care attendant assist pt) Type of Home: House Home Access: Stairs to enter Entrance Stairs-Rails: Right;Left;Can reach both Entrance Stairs-Number of Steps: 3 to get into house, +3 once inside house as described by pt's sister Home Layout: Two level;Able to live on main level with bedroom/bathroom Home Equipment: Claire West - single point;Walker - 2 wheels;Shower seat;Bedside commode;Wheelchair - manual;Hand held shower head;Hospital bed Additional Comments: pt has hospital bed on the first floor of her home, where she sleeps. Pt showers upstairs    Prior Function Level of Independence: Needs assistance   Gait / Transfers Assistance Needed: Pt reports using RW for ambulation 100% of the time, reports history of 5 falls in the past year.  ADL's / Homemaking Assistance Needed: Pt receives assist with cooking, cleaning, dressing, bathing self.        Hand Dominance   Dominant Hand: Right    Extremity/Trunk Assessment   Upper Extremity Assessment Upper Extremity Assessment: Overall WFL for tasks assessed    Lower Extremity Assessment Lower Extremity Assessment: RLE deficits/detail;Generalized weakness RLE Deficits / Details: suspected post-operative weakness; able to perform ankle pumps, quad set, heel slide to 45* limited by pain and stiffness, and SLR with min lift assist RLE Sensation: WNL(pt reports tingling in bilateral feet in supine, pt reports feet feel WNL in standing)    Cervical / Trunk Assessment Cervical / Trunk Assessment: Normal  Communication   Communication: No difficulties  Cognition Arousal/Alertness: Awake/alert Behavior During Therapy: WFL for tasks assessed/performed Overall Cognitive Status: History of cognitive impairments -  at baseline                                 General Comments: Pt with MCI per chart review and per pt's sister at bedside. Pt is pleasant and follows commands well with PT.      General Comments      Exercises Total Joint Exercises Goniometric ROM: knee AAROM ~5-45*, limited by pain   Assessment/Plan    PT Assessment Patient needs continued PT services  PT Problem List Decreased strength;Decreased mobility;Decreased range of motion;Decreased activity tolerance;Decreased balance;Decreased knowledge of use of DME;Pain       PT Treatment Interventions DME instruction;Therapeutic activities;Gait training;Therapeutic exercise;Patient/family education;Balance training;Stair training;Functional mobility training    PT Goals (Current goals can be found in the Care Plan section)  Acute Rehab PT Goals Patient Stated Goal: go home, decrease R knee pain PT Goal Formulation: With patient Time For Goal Achievement: 06/29/19 Potential to Achieve Goals: Good    Frequency 7X/week   Barriers to discharge        Co-evaluation               AM-PAC PT "6 Clicks" Mobility  Outcome Measure Help needed turning from your back to your side while in a flat bed without using bedrails?: A Lot Help needed moving from lying on your back to sitting on the side of a flat bed without using bedrails?: A Lot Help needed moving to and from a bed to a chair (including a wheelchair)?: A Little Help needed standing up from a chair using your arms (e.g., wheelchair or bedside chair)?: A  Little Help needed to walk in hospital room?: A Little Help needed climbing 3-5 steps with a railing? : A Lot 6 Click Score: 15    End of Session Equipment Utilized During Treatment: Gait belt;Right knee immobilizer Activity Tolerance: Patient limited by pain Patient left: in chair;with chair alarm set;with call bell/phone within reach;with SCD's reapplied;with family/visitor present Nurse Communication:  Mobility status;Patient requests pain meds PT Visit Diagnosis: Other abnormalities of gait and mobility (R26.89);Difficulty in walking, not elsewhere classified (R26.2)    Time: [id number] PT Time Calculation (min) (ACUTE ONLY): 28 min   Charges:   PT Evaluation $PT Eval Low Complexity: 1 Low PT Treatments $Gait Training: 8-22 mins      Julien Girt, PT Acute Rehabilitation Services Pager [phone]  Office [phone]   Llesenia Fogal D Elonda Husky 06/22/2019, 5:53 PM

## 2019-06-22 NOTE — Op Note (Signed)
OPERATIVE REPORT-TOTAL KNEE ARTHROPLASTY   Pre-operative diagnosis- Osteoarthritis  Right knee(s)  Post-operative diagnosis- Osteoarthritis Right knee(s)  Procedure-  Right  Total Knee Arthroplasty  Surgeon- Claire West. Sydney Azure, MD  Assistant- Ardeen Jourdain, PA-C   Anesthesia-  Adductor canal block and spinal  EBL-20 mL   Drains Hemovac  Tourniquet time- 27 minutes @ [id number] mm Hg  Complications- None  Condition-PACU - hemodynamically stable.   Brief Clinical Note  Claire West is a 78 y.o. year old female with end stage OA of her right knee with progressively worsening pain and dysfunction. She has constant pain, with activity and at rest and significant functional deficits with difficulties even with ADLs. She has had extensive non-op management including analgesics, injections of cortisone and viscosupplements, and home exercise program, but remains in significant pain with significant dysfunction.Radiographs show bone on bone arthritis lateral and patellofemoral. She presents now for right Total Knee Arthroplasty.    Procedure in detail---   The patient is brought into the operating room and positioned supine on the operating table. After successful administration of  Adductor canal block and spinal,   a tourniquet is placed high on the  Right thigh(s) and the lower extremity is prepped and draped in the usual sterile fashion. Time out is performed by the operating team and then the  Right lower extremity is wrapped in Esmarch, knee flexed and the tourniquet inflated to 300 mmHg.       A midline incision is made with a ten blade through the subcutaneous tissue to the level of the extensor mechanism. A fresh blade is used to make a medial parapatellar arthrotomy. Soft tissue over the proximal medial tibia is subperiosteally elevated to the joint line with a knife and into the semimembranosus bursa with a Cobb elevator. Soft tissue over the proximal lateral tibia is elevated with  attention being paid to avoiding the patellar tendon on the tibial tubercle. The patella is everted, knee flexed 90 degrees and the ACL and PCL are removed. Findings are bone on bone lateral and patellofemoral with large global osteophytes.        The drill is used to create a starting hole in the distal femur and the canal is thoroughly irrigated with sterile saline to remove the fatty contents. The 5 degree Right  valgus alignment guide is placed into the femoral canal and the distal femoral cutting block is pinned to remove 10 mm off the distal femur. Resection is made with an oscillating saw.      The tibia is subluxed forward and the menisci are removed. The extramedullary alignment guide is placed referencing proximally at the medial aspect of the tibial tubercle and distally along the second metatarsal axis and tibial crest. The block is pinned to remove 39mm off the more deficient lateral  side. Resection is made with an oscillating saw. Size 3is the most appropriate size for the tibia and the proximal tibia is prepared with the modular drill and keel punch for that size.      The femoral sizing guide is placed and size 3 is most appropriate. Rotation is marked off the epicondylar axis and confirmed by creating a rectangular flexion gap at 90 degrees. The size 3 cutting block is pinned in this rotation and the anterior, posterior and chamfer cuts are made with the oscillating saw. The intercondylar block is then placed and that cut is made.      Trial size 3 tibial component, trial size 3 posterior stabilized  femur and a 10  mm posterior stabilized rotating platform insert trial is placed. Full extension is achieved with excellent varus/valgus and anterior/posterior balance throughout full range of motion. The patella is everted and thickness measured to be 22  mm. Free hand resection is taken to 12 mm, a 35 template is placed, lug holes are drilled, trial patella is placed, and it tracks normally.  Osteophytes are removed off the posterior femur with the trial in place. All trials are removed and the cut bone surfaces prepared with pulsatile lavage. Cement is mixed and once ready for implantation, the size 3 tibial implant, size  3 posterior stabilized femoral component, and the size 35 patella are cemented in place and the patella is held with the clamp. The trial insert is placed and the knee held in full extension. The Exparel (20 ml mixed with 60 ml saline) is injected into the extensor mechanism, posterior capsule, medial and lateral gutters and subcutaneous tissues.  All extruded cement is removed and once the cement is hard the permanent 10 mm posterior stabilized rotating platform insert is placed into the tibial tray.      The wound is copiously irrigated with saline solution and the extensor mechanism closed over a hemovac drain with #1 V-loc suture. The tourniquet is released for a total tourniquet time of 27  minutes. Flexion against gravity is 140 degrees and the patella tracks normally. Subcutaneous tissue is closed with 2.0 vicryl and subcuticular with running 4.0 Monocryl. The incision is cleaned and dried and steri-strips and a bulky sterile dressing are applied. The limb is placed into a knee immobilizer and the patient is awakened and transported to recovery in stable condition.      Please note that a surgical assistant was a medical necessity for this procedure in order to perform it in a safe and expeditious manner. Surgical assistant was necessary to retract the ligaments and vital neurovascular structures to prevent injury to them and also necessary for proper positioning of the limb to allow for anatomic placement of the prosthesis.   Claire West Claire Kayes, MD    06/22/2019, 12:09 PM

## 2019-06-22 NOTE — Anesthesia Preprocedure Evaluation (Signed)
Anesthesia Evaluation  Patient identified by MRN, date of birth, ID band Patient awake    Reviewed: Allergy & Precautions, NPO status , Patient's Chart, lab work & pertinent test results, reviewed documented beta blocker date and time   Airway Mallampati: II  TM Distance: >3 FB Neck ROM: Full    Dental no notable dental hx. (+) Teeth Intact   Pulmonary neg pulmonary ROS,    Pulmonary exam normal breath sounds clear to auscultation       Cardiovascular hypertension, Pt. on home beta blockers and Pt. on medications Normal cardiovascular exam Rhythm:Regular Rate:Normal     Neuro/Psych PSYCHIATRIC DISORDERS Depression Mild cognitive impairment TIA   GI/Hepatic Neg liver ROS, GERD  Medicated and Controlled,  Endo/Other  Hyperlipidemia  Renal/GU negative Renal ROS  negative genitourinary   Musculoskeletal  (+) Arthritis , Osteoarthritis,  OA right knee   Abdominal   Peds  Hematology negative hematology ROS (+)   Anesthesia Other Findings   Reproductive/Obstetrics                             Anesthesia Physical Anesthesia Plan  ASA: III  Anesthesia Plan: Spinal   Post-op Pain Management:  Regional for Post-op pain   Induction:   PONV Risk Score and Plan: 3 and Treatment may vary due to age or medical condition, Propofol infusion and Ondansetron  Airway Management Planned: Natural Airway and Simple Face Mask  Additional Equipment:   Intra-op Plan:   Post-operative Plan:   Informed Consent: I have reviewed the patients History and Physical, chart, labs and discussed the procedure including the risks, benefits and alternatives for the proposed anesthesia with the patient or authorized representative who has indicated his/her understanding and acceptance.     Dental advisory given  Plan Discussed with: CRNA and Surgeon  Anesthesia Plan Comments:         Anesthesia Quick  Evaluation

## 2019-06-22 NOTE — Transfer of Care (Signed)
Immediate Anesthesia Transfer of Care Note  Patient: Claire West  Procedure(s) Performed: TOTAL KNEE ARTHROPLASTY (Right Knee)  Patient Location: PACU  Anesthesia Type:Spinal  Level of Consciousness: awake, alert , oriented and patient cooperative  Airway & Oxygen Therapy: Patient Spontanous Breathing and Patient connected to face mask oxygen  Post-op Assessment: Report given to RN and Post -op Vital signs reviewed and stable  Post vital signs: Reviewed and stable  Last Vitals:  Vitals Value Taken Time  BP 112/63 06/22/19 1222  Temp    Pulse 63 06/22/19 1224  Resp 14 06/22/19 1224  SpO2 100 % 06/22/19 1224  Vitals shown include unvalidated device data.  Last Pain:  Vitals:   06/22/19 1027  TempSrc:   PainSc: 0-No pain      Patients Stated Pain Goal: 4 ([id number])  Complications: No apparent anesthesia complications

## 2019-06-22 NOTE — Anesthesia Procedure Notes (Signed)
Spinal  Patient location during procedure: OR Start time: 06/22/2019 11:11 AM End time: 06/22/2019 11:16 AM Staffing Resident/CRNA: Garrel Ridgel, CRNA Performed: resident/CRNA  Preanesthetic Checklist Completed: patient identified, site marked, surgical consent, pre-op evaluation, timeout performed, IV checked, risks and benefits discussed and monitors and equipment checked Spinal Block Patient position: sitting Prep: Betadine Patient monitoring: heart rate, continuous pulse ox and blood pressure Approach: midline Location: L3-4 Injection technique: single-shot Needle Needle type: Pencan  Needle gauge: 24 G Needle length: 9 cm Needle insertion depth: 5 cm

## 2019-06-22 NOTE — Interval H&P Note (Signed)
History and Physical Interval Note:  06/22/2019 9:18 AM  Claire West  has presented today for surgery, with the diagnosis of right knee osteoarthritis.  The various methods of treatment have been discussed with the patient and family. After consideration of risks, benefits and other options for treatment, the patient has consented to  Procedure(s) with comments: TOTAL KNEE ARTHROPLASTY (Right) - 90min as a surgical intervention.  The patient's history has been reviewed, patient examined, no change in status, stable for surgery.  I have reviewed the patient's chart and labs.  Questions were answered to the patient's satisfaction.     Pilar Plate Delante Karapetyan

## 2019-06-22 NOTE — Discharge Instructions (Addendum)
° °Dr. Frank Aluisio °Total Joint Specialist °[facility] °[address] °[place], [place] [address] °[phone] ° °TOTAL KNEE REPLACEMENT POSTOPERATIVE DIRECTIONS ° °Knee Rehabilitation, Guidelines Following Surgery  °Results after knee surgery are often greatly improved when you follow the exercise, range of motion and muscle strengthening exercises prescribed by your doctor. Safety measures are also important to protect the knee from further injury. Any time any of these exercises cause you to have increased pain or swelling in your knee joint, decrease the amount until you are comfortable again and slowly increase them. If you have problems or questions, call your caregiver or physical therapist for advice.  ° °HOME CARE INSTRUCTIONS  °• Remove items at home which could result in a fall. This includes throw rugs or furniture in walking pathways.  °· ICE to the affected knee every three hours for 30 minutes at a time and then as needed for pain and swelling.  Continue to use ice on the knee for pain and swelling from surgery. You may notice swelling that will progress down to the foot and ankle.  This is normal after surgery.  Elevate the leg when you are not up walking on it.   °· Continue to use the breathing machine which will help keep your temperature down.  It is common for your temperature to cycle up and down following surgery, especially at night when you are not up moving around and exerting yourself.  The breathing machine keeps your lungs expanded and your temperature down. °· Do not place pillow under knee, focus on keeping the knee straight while resting ° °DIET °You may resume your previous home diet once your are discharged from the hospital. ° °DRESSING / WOUND CARE / SHOWERING °You may shower 3 days after surgery, but keep the wounds dry during showering.  You may use an occlusive plastic wrap (Press'n Seal for example), NO SOAKING/SUBMERGING IN THE BATHTUB.  If the bandage  gets wet, change with a clean dry gauze.  If the incision gets wet, pat the wound dry with a clean towel. °You may start showering once you are discharged home but do not submerge the incision under water. Just pat the incision dry and apply a dry gauze dressing on daily. °Change the surgical dressing daily and reapply a dry dressing each time. ° °ACTIVITY °Walk with your walker as instructed. °Use walker as long as suggested by your caregivers. °Avoid periods of inactivity such as sitting longer than an hour when not asleep. This helps prevent blood clots.  °You may resume a sexual relationship in one month or when given the OK by your doctor.  °You may return to work once you are cleared by your doctor.  °Do not drive a car for 6 weeks or until released by you surgeon.  °Do not drive while taking narcotics. ° °WEIGHT BEARING °Weight bearing as tolerated with assist device (walker, cane, etc) as directed, use it as long as suggested by your surgeon or therapist, typically at least 4-6 weeks. ° °POSTOPERATIVE CONSTIPATION PROTOCOL °Constipation - defined medically as fewer than three stools per week and severe constipation as less than one stool per week. ° °One of the most common issues patients have following surgery is constipation.  Even if you have a regular bowel pattern at home, your normal regimen is likely to be disrupted due to multiple reasons following surgery.  Combination of anesthesia, postoperative narcotics, change in appetite and fluid intake all can affect your bowels.    In order to avoid complications following surgery, here are some recommendations in order to help you during your recovery period. ° °Colace (docusate) - Pick up an over-the-counter form of Colace or another stool softener and take twice a day as long as you are requiring postoperative pain medications.  Take with a full glass of water daily.  If you experience loose stools or diarrhea, hold the colace until you stool forms back  up.  If your symptoms do not get better within 1 week or if they get worse, check with your doctor. ° °Dulcolax (bisacodyl) - Pick up over-the-counter and take as directed by the product packaging as needed to assist with the movement of your bowels.  Take with a full glass of water.  Use this product as needed if not relieved by Colace only.  ° °MiraLax (polyethylene glycol) - Pick up over-the-counter to have on hand.  MiraLax is a solution that will increase the amount of water in your bowels to assist with bowel movements.  Take as directed and can mix with a glass of water, juice, soda, coffee, or tea.  Take if you go more than two days without a movement. °Do not use MiraLax more than once per day. Call your doctor if you are still constipated or irregular after using this medication for 7 days in a row. ° °If you continue to have problems with postoperative constipation, please contact the office for further assistance and recommendations.  If you experience "the worst abdominal pain ever" or develop nausea or vomiting, please contact the office immediatly for further recommendations for treatment. ° °ITCHING ° If you experience itching with your medications, try taking only a single pain pill, or even half a pain pill at a time.  You can also use Benadryl over the counter for itching or also to help with sleep.  ° °TED HOSE STOCKINGS °Wear the elastic stockings on both legs for three weeks following surgery during the day but you may remove then at night for sleeping. ° °MEDICATIONS °See your medication summary on the “After Visit Summary” that the nursing staff will review with you prior to discharge.  You may have some home medications which will be placed on hold until you complete the course of blood thinner medication.  It is important for you to complete the blood thinner medication as prescribed by your surgeon.  Continue your approved medications as instructed at time of discharge. ° °PRECAUTIONS °If  you experience chest pain or shortness of breath - call 911 immediately for transfer to the hospital emergency department.  °If you develop a fever greater that 101 F, purulent drainage from wound, increased redness or drainage from wound, foul odor from the wound/dressing, or calf pain - CONTACT YOUR SURGEON.   °                                                °FOLLOW-UP APPOINTMENTS °Make sure you keep all of your appointments after your operation with your surgeon and caregivers. You should call the office at the above phone number and make an appointment for approximately two weeks after the date of your surgery or on the date instructed by your surgeon outlined in the "After Visit Summary". ° ° °RANGE OF MOTION AND STRENGTHENING EXERCISES  °Rehabilitation of the knee is important following a knee injury or   an operation. After just a few days of immobilization, the muscles of the thigh which control the knee become weakened and shrink (atrophy). Knee exercises are designed to build up the tone and strength of the thigh muscles and to improve knee motion. Often times heat used for twenty to thirty minutes before working out will loosen up your tissues and help with improving the range of motion but do not use heat for the first two weeks following surgery. These exercises can be done on a training (exercise) mat, on the floor, on a table or on a bed. Use what ever works the best and is most comfortable for you Knee exercises include:  °• Leg Lifts - While your knee is still immobilized in a splint or cast, you can do straight leg raises. Lift the leg to 60 degrees, hold for 3 sec, and slowly lower the leg. Repeat 10-20 times 2-3 times daily. Perform this exercise against resistance later as your knee gets better.  °• Quad and Hamstring Sets - Tighten up the muscle on the front of the thigh (Quad) and hold for 5-10 sec. Repeat this 10-20 times hourly. Hamstring sets are done by pushing the foot backward against an  object and holding for 5-10 sec. Repeat as with quad sets.  °· Leg Slides: Lying on your back, slowly slide your foot toward your buttocks, bending your knee up off the floor (only go as far as is comfortable). Then slowly slide your foot back down until your leg is flat on the floor again. °· Angel Wings: Lying on your back spread your legs to the side as far apart as you can without causing discomfort.  °A rehabilitation program following serious knee injuries can speed recovery and prevent re-injury in the future due to weakened muscles. Contact your doctor or a physical therapist for more information on knee rehabilitation.  ° °IF YOU ARE TRANSFERRED TO A SKILLED REHAB FACILITY °If the patient is transferred to a skilled rehab facility following release from the hospital, a list of the current medications will be sent to the facility for the patient to continue.  When discharged from the skilled rehab facility, please have the facility set up the patient's Home Health Physical Therapy prior to being released. Also, the skilled facility will be responsible for providing the patient with their medications at time of release from the facility to include their pain medication, the muscle relaxants, and their blood thinner medication. If the patient is still at the rehab facility at time of the two week follow up appointment, the skilled rehab facility will also need to assist the patient in arranging follow up appointment in our office and any transportation needs. ° °MAKE SURE YOU:  °• Understand these instructions.  °• Get help right away if you are not doing well or get worse.  ° ° °Pick up stool softner and laxative for home use following surgery while on pain medications. °Do not submerge incision under water. °Please use good hand washing techniques while changing dressing each day. °May shower starting three days after surgery. °Please use a clean towel to pat the incision dry following showers. °Continue to  use ice for pain and swelling after surgery. °Do not use any lotions or creams on the incision until instructed by your surgeon. ° °Information on my medicine - XARELTO® (Rivaroxaban) ° °This medication education was reviewed with me or my healthcare representative as part of my discharge preparation.  The pharmacist that spoke with me   during my hospital stay was:   ° °Why was Xarelto® prescribed for you? °Xarelto® was prescribed for you to reduce the risk of blood clots forming after orthopedic surgery. The medical term for these abnormal blood clots is venous thromboembolism (VTE). ° °What do you need to know about xarelto® ? °Take your Xarelto® ONCE DAILY at the same time every day. °You may take it either with or without food. ° °If you have difficulty swallowing the tablet whole, you may crush it and mix in applesauce just prior to taking your dose. ° °Take Xarelto® exactly as prescribed by your doctor and DO NOT stop taking Xarelto® without talking to the doctor who prescribed the medication.  Stopping without other VTE prevention medication to take the place of Xarelto® may increase your risk of developing a clot. ° °After discharge, you should have regular check-up appointments with your healthcare provider that is prescribing your Xarelto®.   ° °What do you do if you miss a dose? °If you miss a dose, take it as soon as you remember on the same day then continue your regularly scheduled once daily regimen the next day. Do not take two doses of Xarelto® on the same day.  ° °Important Safety Information °A possible side effect of Xarelto® is bleeding. You should call your healthcare provider right away if you experience any of the following: °? Bleeding from an injury or your nose that does not stop. °? Unusual colored urine (red or dark brown) or unusual colored stools (red or black). °? Unusual bruising for unknown reasons. °? A serious fall or if you hit your head (even if there is no bleeding). ° °Some  medicines may interact with Xarelto® and might increase your risk of bleeding while on Xarelto®. To help avoid this, consult your healthcare provider or pharmacist prior to using any new prescription or non-prescription medications, including herbals, vitamins, non-steroidal anti-inflammatory drugs (NSAIDs) and supplements. ° °This website has more information on Xarelto®: www.xarelto.com. ° ° ° ° °

## 2019-06-22 NOTE — Anesthesia Postprocedure Evaluation (Signed)
Anesthesia Post Note  Patient: Claire West  Procedure(s) Performed: TOTAL KNEE ARTHROPLASTY (Right Knee)     Patient location during evaluation: PACU Anesthesia Type: Spinal Level of consciousness: oriented and awake and alert Pain management: pain level controlled Vital Signs Assessment: post-procedure vital signs reviewed and stable Respiratory status: spontaneous breathing, respiratory function stable and nonlabored ventilation Cardiovascular status: blood pressure returned to baseline and stable Postop Assessment: no headache, no backache, no apparent nausea or vomiting and spinal receding Anesthetic complications: no    Last Vitals:  Vitals:   06/22/19 1300 06/22/19 1315  BP: 133/64 (!) 144/66  Pulse:  62  Resp: 15 15  Temp:    SpO2:  95%    Last Pain:  Vitals:   06/22/19 1315  TempSrc:   PainSc: 0-No pain                 Nataline Basara A.

## 2019-06-22 NOTE — Progress Notes (Signed)
AssistedDr. Foster with right, ultrasound guided, adductor canal block. Side rails up, monitors on throughout procedure. See vital signs in flow sheet. Tolerated Procedure well.  

## 2019-06-23 ENCOUNTER — Encounter (HOSPITAL_COMMUNITY): Payer: Self-pay | Admitting: Orthopedic Surgery

## 2019-06-23 LAB — CBC
HCT: 31.8 % — ABNORMAL LOW (ref 36.0–46.0)
Hemoglobin: 10.9 g/dL — ABNORMAL LOW (ref 12.0–15.0)
MCH: 32 pg (ref 26.0–34.0)
MCHC: 34.3 g/dL (ref 30.0–36.0)
MCV: 93.3 fL (ref 80.0–100.0)
Platelets: 192 10*3/uL (ref 150–400)
RBC: 3.41 MIL/uL — ABNORMAL LOW (ref 3.87–5.11)
RDW: 11.9 % (ref 11.5–15.5)
WBC: 11.1 10*3/uL — ABNORMAL HIGH (ref 4.0–10.5)
nRBC: 0 % (ref 0.0–0.2)

## 2019-06-23 LAB — BASIC METABOLIC PANEL
Anion gap: 9 (ref 5–15)
BUN: 12 mg/dL (ref 8–23)
CO2: 25 mmol/L (ref 22–32)
Calcium: 9 mg/dL (ref 8.9–10.3)
Chloride: 101 mmol/L (ref 98–111)
Creatinine, Ser: 0.79 mg/dL (ref 0.44–1.00)
GFR calc Af Amer: >60 mL/min (ref >60)
GFR calc non Af Amer: >60 mL/min (ref >60)
Glucose, Bld: 147 mg/dL — ABNORMAL HIGH (ref 70–99)
Potassium: 4.4 mmol/L (ref 3.5–5.1)
Sodium: 135 mmol/L (ref 135–145)

## 2019-06-23 NOTE — Progress Notes (Signed)
Physical Therapy Treatment Patient Details Name: Claire West MRN: [id number] DOB: 07-23-1941 Today's Date: 06/23/2019    History of Present Illness 78 y.o. female s/p R TKR on 06/22/19. PMH includes multiple falls, TIA, OA, MCI, hypertension, depression.    PT Comments    Progressing slowly with mobility. Pt continues to have cognition issues. Will continue to follow and progress activity as tolerated.    Follow Up Recommendations  Follow surgeon's recommendation for DC plan and follow-up therapies;Supervision for mobility/OOB     Equipment Recommendations  None recommended by PT    Recommendations for Other Services       Precautions / Restrictions Precautions Precautions: Fall Required Braces or Orthoses: Knee Immobilizer - Right Knee Immobilizer - Right: Discontinue once straight leg raise with < 10 degree lag Restrictions Weight Bearing Restrictions: No Other Position/Activity Restrictions: WBAT    Mobility  Bed Mobility Overal bed mobility: Needs Assistance Bed Mobility: Supine to Sit;Sit to Supine     Supine to sit: Mod assist Sit to supine: Mod assist   General bed mobility comments: Assist for trunk and R LE. Increased time. Multimodal cueing required.  Transfers Overall transfer level: Needs assistance Equipment used: Rolling walker (2 wheeled) Transfers: Sit to/from Stand Sit to Stand: Mod assist Stand pivot transfers: Mod assist       General transfer comment: Assist to rise, stabilize, control descent. VCs safety, technique, hand/LE placement. Stand pivot, bsc>bed, with RW  Ambulation/Gait Ambulation/Gait assistance: Min assist;+2 safety/equipment Gait Distance (Feet): 70 Feet Assistive device: Rolling walker (2 wheeled) Gait Pattern/deviations: Step-to pattern;Step-through pattern;Trunk flexed     General Gait Details: Mod cues for posture, proper use of RW, distance from RW, attention to task. Followed with recliner for safety. Assist to  stabilize pt and manage RW. Slow gait speed. Pt had difficulty keeping her eyes open at times   Stairs             Wheelchair Mobility    Modified Rankin (Stroke Patients Only)       Balance Overall balance assessment: Needs assistance;History of Falls         Standing balance support: Bilateral upper extremity supported Standing balance-Leahy Scale: Poor                              Cognition Arousal/Alertness: Awake/alert(but drowsy at times likely due to pain meds) Behavior During Therapy: WFL for tasks assessed/performed Overall Cognitive Status: Impaired/Different from baseline Area of Impairment: Attention;Safety/judgement;Problem solving;Following commands                   Current Attention Level: Selective   Following Commands: Follows one step commands with increased time Safety/Judgement: Decreased awareness of safety;Decreased awareness of deficits   Problem Solving: Requires verbal cues;Requires tactile cues;Slow processing General Comments: Visitor present-stated pt does not react well to strong pain meds-she attributes pt's current cognitive state to pain meds.      Exercises      General Comments        Pertinent Vitals/Pain Pain Assessment: Faces Pain Score: 7  Faces Pain Scale: Hurts even more Pain Location: R knee Pain Descriptors / Indicators: Aching;Discomfort;Sore Pain Intervention(s): Monitored during session;Repositioned;Ice applied    Home Living                      Prior Function            PT Goals (current goals  can now be found in the care plan section) Progress towards PT goals: Progressing toward goals    Frequency    7X/week      PT Plan Current plan remains appropriate    Co-evaluation              AM-PAC PT "6 Clicks" Mobility   Outcome Measure  Help needed turning from your back to your side while in a flat bed without using bedrails?: A Little Help needed moving  from lying on your back to sitting on the side of a flat bed without using bedrails?: A Lot Help needed moving to and from a bed to a chair (including a wheelchair)?: A Lot Help needed standing up from a chair using your arms (e.g., wheelchair or bedside chair)?: A Lot Help needed to walk in hospital room?: A Little Help needed climbing 3-5 steps with a railing? : A Lot 6 Click Score: 14    End of Session Equipment Utilized During Treatment: Gait belt;Right knee immobilizer Activity Tolerance: Patient tolerated treatment well Patient left: in bed;with call bell/phone within reach;with bed alarm set;with family/visitor present   PT Visit Diagnosis: Other abnormalities of gait and mobility (R26.89);Difficulty in walking, not elsewhere classified (R26.2)     Time: [id number] PT Time Calculation (min) (ACUTE ONLY): 23 min  Charges:  $Gait Training: 8-22 mins $Therapeutic Activity: 8-22 mins                        Weston Anna, PT Acute Rehabilitation Services Pager: [phone] Office: [phone]

## 2019-06-23 NOTE — Progress Notes (Signed)
Physical Therapy Treatment Patient Details Name: Claire West MRN: [id number] DOB: 07-11-1941 Today's Date: 06/23/2019    History of Present Illness 78 y.o. female s/p R TKR on 06/22/19. PMH includes multiple falls, TIA, OA, MCI, hypertension, depression.    PT Comments    Progressing slowly with mobility.    Follow Up Recommendations  Follow surgeon's recommendation for DC plan and follow-up therapies;Supervision for mobility/OOB     Equipment Recommendations  None recommended by PT    Recommendations for Other Services       Precautions / Restrictions Precautions Precautions: Fall Required Braces or Orthoses: Knee Immobilizer - Right Knee Immobilizer - Right: Discontinue once straight leg raise with < 10 degree lag Restrictions Weight Bearing Restrictions: No Other Position/Activity Restrictions: WBAT    Mobility  Bed Mobility Overal bed mobility: Needs Assistance Bed Mobility: Supine to Sit     Supine to sit: HOB elevated;Min assist     General bed mobility comments: Assist for trunk and R LE. Increased time. Multimodal cueing required.  Transfers Overall transfer level: Needs assistance Equipment used: Rolling walker (2 wheeled) Transfers: Sit to/from [facility] Sit to Stand: Min assist;+2 physical assistance;+2 safety/equipment;From elevated surface Stand pivot transfers: Min assist;+2 safety/equipment       General transfer comment: Assist to rise, stabilize, control descent. VCs safety, technique, hand/LE placement.  Ambulation/Gait Ambulation/Gait assistance: Min assist;+2 safety/equipment Gait Distance (Feet): 60 Feet Assistive device: Rolling walker (2 wheeled) Gait Pattern/deviations: Step-to pattern;Step-through pattern;Trunk flexed     General Gait Details: Mod cues for posture, proper use of RW, distance from RW, attention to task. Followed with recliner for safety.   Stairs             Wheelchair Mobility     Modified Rankin (Stroke Patients Only)       Balance Overall balance assessment: Needs assistance;History of Falls         Standing balance support: Bilateral upper extremity supported Standing balance-Leahy Scale: Poor                              Cognition Arousal/Alertness: Awake/alert Behavior During Therapy: WFL for tasks assessed/performed Overall Cognitive Status: No family/caregiver present to determine baseline cognitive functioning Area of Impairment: Attention;Safety/judgement;Problem solving;Following commands                   Current Attention Level: Selective   Following Commands: Follows one step commands with increased time Safety/Judgement: Decreased awareness of safety;Decreased awareness of deficits   Problem Solving: Requires verbal cues;Requires tactile cues;Slow processing        Exercises      General Comments        Pertinent Vitals/Pain Pain Assessment: 0-10 Pain Score: 7  Pain Location: R knee Pain Descriptors / Indicators: Aching;Discomfort;Sore Pain Intervention(s): Monitored during session;Repositioned;Ice applied    Home Living                      Prior Function            PT Goals (current goals can now be found in the care plan section) Progress towards PT goals: Progressing toward goals    Frequency    7X/week      PT Plan Current plan remains appropriate    Co-evaluation              AM-PAC PT "6 Clicks" Mobility   Outcome Measure  Help  needed turning from your back to your side while in a flat bed without using bedrails?: A Little Help needed moving from lying on your back to sitting on the side of a flat bed without using bedrails?: A Lot Help needed moving to and from a bed to a chair (including a wheelchair)?: A Little Help needed standing up from a chair using your arms (e.g., wheelchair or bedside chair)?: A Little Help needed to walk in hospital room?: A  Little Help needed climbing 3-5 steps with a railing? : A Lot 6 Click Score: 16    End of Session Equipment Utilized During Treatment: Gait belt;Right knee immobilizer Activity Tolerance: Patient tolerated treatment well Patient left: in chair;with call bell/phone within reach;with chair alarm set   PT Visit Diagnosis: Other abnormalities of gait and mobility (R26.89);Difficulty in walking, not elsewhere classified (R26.2)     Time: [id number] PT Time Calculation (min) (ACUTE ONLY): 41 min  Charges:  $Gait Training: 23-37 mins $Therapeutic Activity: 8-22 mins                        Weston Anna, PT Acute Rehabilitation Services Pager: [phone] Office: [phone]

## 2019-06-23 NOTE — TOC Initial Note (Signed)
Transition of Care [facility]) - Initial/Assessment Note    Patient Details  Name: Claire West MRN: [id number] Date of Birth: 1941-07-08  [facility] CM/SW Contact:    Joaquin Courts, RN Phone Number: 06/23/2019, 9:46 AM  Clinical Narrative:                 CM spoke with patient at bedside. Patient plans to discharge home with OPPT, reports has rolling walker and 3-in-1 at home.  Expected Discharge Plan: Home/Self Care Barriers to Discharge: No Barriers Identified   Patient Goals and CMS Choice Patient states their goals for this hospitalization and ongoing recovery are:: to go home      Expected Discharge Plan and Services Expected Discharge Plan: Home/Self Care   Discharge Planning Services: CM Consult   Living arrangements for the past 2 months: Single Family Home                 DME Arranged: N/A DME Agency: NA       HH Arranged: NA HH Agency: NA        Prior Living Arrangements/Services Living arrangements for the past 2 months: Single Family Home Lives with:: Relatives Patient language and need for interpreter reviewed:: Yes Do you feel safe going back to the place where you live?: Yes      Need for Family Participation in Patient Care: Yes (Comment) Care giver support system in place?: Yes (comment)   Criminal Activity/Legal Involvement Pertinent to Current Situation/Hospitalization: No - Comment as needed  Activities of Daily Living Home Assistive Devices/Equipment: Walker (specify type), Eyeglasses, Blood pressure cuff, Cane (specify quad or straight), Built-in shower seat, Raised toilet seat with rails ADL Screening (condition at time of admission) Patient's cognitive ability adequate to safely complete daily activities?: Yes Is the patient deaf or have difficulty hearing?: No Does the patient have difficulty seeing, even when wearing glasses/contacts?: No Does the patient have difficulty concentrating, remembering, or making  decisions?: Yes Patient able to express need for assistance with ADLs?: Yes Does the patient have difficulty dressing or bathing?: Yes Independently performs ADLs?: No Communication: Independent Dressing (OT): Needs assistance Is this a change from baseline?: Pre-admission baseline Grooming: Independent Feeding: Independent Bathing: Needs assistance Is this a change from baseline?: Pre-admission baseline Toileting: Needs assistance Is this a change from baseline?: Pre-admission baseline In/Out Bed: Needs assistance Is this a change from baseline?: Pre-admission baseline Walks in Home: Independent with device (comment) Does the patient have difficulty walking or climbing stairs?: Yes Weakness of Legs: Both Weakness of Arms/Hands: None  Permission Sought/Granted                  Emotional Assessment Appearance:: Appears stated age Attitude/Demeanor/Rapport: Engaged Affect (typically observed): Accepting Orientation: : Oriented to Self, Oriented to Place, Oriented to  Time, Oriented to Situation   Psych Involvement: No (comment)  Admission diagnosis:  right knee osteoarthritis Patient Active Problem List   Diagnosis Date Noted  . Falls frequently 10/28/2018  . TIA (transient ischemic attack) 08/23/2018  . Delirium in remission 07/07/2018  . OA (osteoarthritis) of knee 05/05/2018  . Amnestic MCI (mild cognitive impairment with memory loss) 12/24/2017  . Acute metabolic encephalopathy [id number]  . Altered mental status 11/06/2017  . Hyponatremia 02/12/2016  . Hypokalemia 02/12/2016  . Dehydration 02/12/2016  . Elevated LFTs 02/12/2016  . HTN (hypertension) 02/12/2016  . GERD (gastroesophageal reflux disease) 02/12/2016   PCP:  Sharilyn Sites, MD Pharmacy:   [facility] [phone] -  [address] [place] [address] Phone: [phone] Fax: [place], [place] [address] [place]  [place] [address] Phone: [phone] Fax: [phone]     Social Determinants of Health (SDOH) Interventions    Readmission Risk Interventions No flowsheet data found.

## 2019-06-23 NOTE — Progress Notes (Signed)
   Subjective: 1 Day Post-Op Procedure(s) (LRB): TOTAL KNEE ARTHROPLASTY (Right) Patient reports pain as mild.   Patient seen in rounds by Dr. Wynelle Link. Patient is well, and has had no acute complaints or problems other than  discomfort in the right knee. No acute events overnight. Foley catheter removed.  We will continue therapy today.   Objective: Vital signs in last 24 hours: Temp:  [97.5 F (36.4 C)-98 F (36.7 C)] 97.9 F (36.6 C) (09/15 0457) Pulse Rate:  [55-89] 80 (09/15 0457) Resp:  [7-20] 16 (09/15 0457) BP: (112-162)/(60-78) 141/76 (09/15 0457) SpO2:  [92 %-100 %] 92 % (09/15 0457) Weight:  [65.8 kg] 65.8 kg (09/14 0940)  Intake/Output from previous day:  Intake/Output Summary (Last 24 hours) at 06/23/2019 0711 Last data filed at 06/23/2019 0626 Gross per 24 hour  Intake 2985.07 ml  Output 2935 ml  Net 50.07 ml     Intake/Output this shift: No intake/output data recorded.  Labs: Recent Labs    06/23/19 0259  HGB 10.9*   Recent Labs    06/23/19 0259  WBC 11.1*  RBC 3.41*  HCT 31.8*  PLT 192   Recent Labs    06/23/19 0259  NA 135  K 4.4  CL 101  CO2 25  BUN 12  CREATININE 0.79  GLUCOSE 147*  CALCIUM 9.0   No results for input(s): LABPT, INR in the last 72 hours.  Exam: General - Patient is Alert and Oriented Extremity - Neurologically intact Sensation intact distally Intact pulses distally Dorsiflexion/Plantar flexion intact Dressing - dressing C/D/I Motor Function - intact, moving foot and toes well on exam.   Past Medical History:  Diagnosis Date  . Arthritis   . Depression   . Gait instability    walks with cane  . GERD (gastroesophageal reflux disease)   . History of blood transfusion   . Hyperlipemia   . Hypertension   . Knee pain   . Metabolic encephalopathy   . Mild cognitive impairment with memory loss   . Seasonal allergies   . Skin cancer     Assessment/Plan: 1 Day Post-Op Procedure(s) (LRB): TOTAL KNEE  ARTHROPLASTY (Right) Active Problems:   OA (osteoarthritis) of knee  Estimated body mass index is 23.41 kg/m as calculated from the following:   Height as of this encounter: 5\' 6"  (1.676 m).   Weight as of this encounter: 65.8 kg. Advance diet Up with therapy  Anticipated LOS equal to or greater than 2 midnights due to - Age 43 and older with one or more of the following:  - Obesity  - Expected need for hospital services (PT, OT, Nursing) required for safe  discharge  - Anticipated need for postoperative skilled nursing care or inpatient rehab  - Active co-morbidities: None OR   - Unanticipated findings during/Post Surgery: None  - Patient is a high risk of re-admission due to: None    DVT Prophylaxis - Xarelto Weight bearing as tolerated. D/C O2 and pulse ox and try on room air. Hemovac pulled without difficulty, will begin therapy today.  Plan is to go Home after hospital stay. Patient will benefit from continuing to work with therapy today in order to progress to safe discharge home. Continue pain management.    Griffith Citron, PA-C Orthopedic Surgery 06/23/2019, 7:11 AM

## 2019-06-24 ENCOUNTER — Inpatient Hospital Stay (HOSPITAL_COMMUNITY): Payer: Medicare Other

## 2019-06-24 DIAGNOSIS — I10 Essential (primary) hypertension: Secondary | ICD-10-CM

## 2019-06-24 DIAGNOSIS — G9341 Metabolic encephalopathy: Secondary | ICD-10-CM

## 2019-06-24 DIAGNOSIS — E871 Hypo-osmolality and hyponatremia: Secondary | ICD-10-CM

## 2019-06-24 DIAGNOSIS — E876 Hypokalemia: Secondary | ICD-10-CM

## 2019-06-24 DIAGNOSIS — M1711 Unilateral primary osteoarthritis, right knee: Principal | ICD-10-CM

## 2019-06-24 DIAGNOSIS — R410 Disorientation, unspecified: Secondary | ICD-10-CM

## 2019-06-24 LAB — CBC
HCT: 27.8 % — ABNORMAL LOW (ref 36.0–46.0)
Hemoglobin: 9.1 g/dL — ABNORMAL LOW (ref 12.0–15.0)
MCH: 31.6 pg (ref 26.0–34.0)
MCHC: 32.7 g/dL (ref 30.0–36.0)
MCV: 96.5 fL (ref 80.0–100.0)
Platelets: 156 10*3/uL (ref 150–400)
RBC: 2.88 MIL/uL — ABNORMAL LOW (ref 3.87–5.11)
RDW: 12.3 % (ref 11.5–15.5)
WBC: 13 10*3/uL — ABNORMAL HIGH (ref 4.0–10.5)
nRBC: 0 % (ref 0.0–0.2)

## 2019-06-24 LAB — BLOOD GAS, VENOUS
Acid-Base Excess: 3.7 mmol/L — ABNORMAL HIGH (ref 0.0–2.0)
Bicarbonate: 26.9 mmol/L (ref 20.0–28.0)
O2 Content: 2 L/min
O2 Saturation: 94.7 %
Patient temperature: 98.6
RATE: 18 resp/min
pCO2, Ven: 36.2 mmHg — ABNORMAL LOW (ref 44.0–60.0)
pH, Ven: 7.483 — ABNORMAL HIGH (ref 7.250–7.430)
pO2, Ven: 67.5 mmHg — ABNORMAL HIGH (ref 32.0–45.0)

## 2019-06-24 LAB — CBC WITH DIFFERENTIAL/PLATELET
Abs Immature Granulocytes: 0.07 10*3/uL (ref 0.00–0.07)
Basophils Absolute: 0 10*3/uL (ref 0.0–0.1)
Basophils Relative: 0 %
Eosinophils Absolute: 0.1 10*3/uL (ref 0.0–0.5)
Eosinophils Relative: 1 %
HCT: 24.4 % — ABNORMAL LOW (ref 36.0–46.0)
Hemoglobin: 8.4 g/dL — ABNORMAL LOW (ref 12.0–15.0)
Immature Granulocytes: 1 %
Lymphocytes Relative: 13 %
Lymphs Abs: 1.4 10*3/uL (ref 0.7–4.0)
MCH: 32.4 pg (ref 26.0–34.0)
MCHC: 34.4 g/dL (ref 30.0–36.0)
MCV: 94.2 fL (ref 80.0–100.0)
Monocytes Absolute: 1.7 10*3/uL — ABNORMAL HIGH (ref 0.1–1.0)
Monocytes Relative: 16 %
Neutro Abs: 7 10*3/uL (ref 1.7–7.7)
Neutrophils Relative %: 69 %
Platelets: 129 10*3/uL — ABNORMAL LOW (ref 150–400)
RBC: 2.59 MIL/uL — ABNORMAL LOW (ref 3.87–5.11)
RDW: 12.4 % (ref 11.5–15.5)
WBC: 10.2 10*3/uL (ref 4.0–10.5)
nRBC: 0 % (ref 0.0–0.2)

## 2019-06-24 LAB — BASIC METABOLIC PANEL
Anion gap: 5 (ref 5–15)
BUN: 20 mg/dL (ref 8–23)
CO2: 27 mmol/L (ref 22–32)
Calcium: 8.6 mg/dL — ABNORMAL LOW (ref 8.9–10.3)
Chloride: 106 mmol/L (ref 98–111)
Creatinine, Ser: 0.88 mg/dL (ref 0.44–1.00)
GFR calc Af Amer: >60 mL/min (ref >60)
GFR calc non Af Amer: >60 mL/min (ref >60)
Glucose, Bld: 105 mg/dL — ABNORMAL HIGH (ref 70–99)
Potassium: 4 mmol/L (ref 3.5–5.1)
Sodium: 138 mmol/L (ref 135–145)

## 2019-06-24 LAB — COMPREHENSIVE METABOLIC PANEL
ALT: 14 U/L (ref 0–44)
AST: 20 U/L (ref 15–41)
Albumin: 2.9 g/dL — ABNORMAL LOW (ref 3.5–5.0)
Alkaline Phosphatase: 55 U/L (ref 38–126)
Anion gap: 10 (ref 5–15)
BUN: 14 mg/dL (ref 8–23)
CO2: 23 mmol/L (ref 22–32)
Calcium: 8 mg/dL — ABNORMAL LOW (ref 8.9–10.3)
Chloride: 101 mmol/L (ref 98–111)
Creatinine, Ser: 0.63 mg/dL (ref 0.44–1.00)
GFR calc Af Amer: >60 mL/min (ref >60)
GFR calc non Af Amer: >60 mL/min (ref >60)
Glucose, Bld: 136 mg/dL — ABNORMAL HIGH (ref 70–99)
Potassium: 3.3 mmol/L — ABNORMAL LOW (ref 3.5–5.1)
Sodium: 134 mmol/L — ABNORMAL LOW (ref 135–145)
Total Bilirubin: 0.5 mg/dL (ref 0.3–1.2)
Total Protein: 5.4 g/dL — ABNORMAL LOW (ref 6.5–8.1)

## 2019-06-24 LAB — GLUCOSE, CAPILLARY: Glucose-Capillary: 130 mg/dL — ABNORMAL HIGH (ref 70–99)

## 2019-06-24 MED ORDER — ACETAMINOPHEN 500 MG PO TABS
500.0000 mg | ORAL_TABLET | Freq: Four times a day (QID) | ORAL | Status: DC | PRN
  Administered 2019-06-24: 500 mg via ORAL
  Administered 2019-06-25 – 2019-06-26 (×3): 1000 mg via ORAL
  Filled 2019-06-24 (×4): qty 2

## 2019-06-24 MED ORDER — POTASSIUM CHLORIDE 10 MEQ/100ML IV SOLN
10.0000 meq | INTRAVENOUS | Status: AC
  Administered 2019-06-25 (×4): 10 meq via INTRAVENOUS
  Filled 2019-06-24 (×4): qty 100

## 2019-06-24 MED ORDER — FERROUS SULFATE 325 (65 FE) MG PO TABS: 325.0000 mg | ORAL_TABLET | Freq: Three times a day (TID) | ORAL | 0 refills | Status: DC

## 2019-06-24 MED ORDER — TRAMADOL HCL 50 MG PO TABS: 50.0000 mg | ORAL_TABLET | Freq: Four times a day (QID) | ORAL | 0 refills | Status: DC | PRN

## 2019-06-24 MED ORDER — METHOCARBAMOL 500 MG PO TABS: 500.0000 mg | ORAL_TABLET | Freq: Four times a day (QID) | ORAL | 0 refills | Status: DC | PRN

## 2019-06-24 MED ORDER — RIVAROXABAN 10 MG PO TABS: 10.0000 mg | ORAL_TABLET | Freq: Every day | ORAL | 0 refills | Status: DC

## 2019-06-24 NOTE — Progress Notes (Addendum)
NuDESC Score 6. RRT RN notified. RRT RN states they will come to the room to assess the patient. RN obtained CBG it was 130.

## 2019-06-24 NOTE — Progress Notes (Signed)
On-coming RN received Handoff from Off-going RN at pt's bedside. Pt was lethargic/drowsy, oriented to self, not following commands, not answering RN's questions, unable to hold arms up, delayed responses, only responding to to painful stimuli, weak L hand grip compared to R hand grip.   Off-going RN stated this was an acute change and that the pt was confused, alert, not following commands, able to eat her meal at dinner without assistance.   Laurine Blazer, PA was notified of findings, that RRT was notified, O2 started at 2L, IV fluids restarted. PA stated she would speak with her supervising Physician and give RN a call back.    RN will stay at bedside and continue to monitor.

## 2019-06-24 NOTE — Progress Notes (Signed)
Subjective: This is a very pleasant 78 year old female who is a patient of Dr. Wynelle Link who had a right total knee replacement on September 14.  I was called tonight due to decreased level consciousness.  This has been progressively worsening over the past few hours starting right after dinnertime.  Just prior to my arrival she was barely alert only to painful stimuli, did not know where she was.  She was also according to the nurse very tired and cannot keep her eyes open.  When I did arrive today she was able to hold more of a conversation.  Patient did know where she was did not know why she was here or who did her surgery just a few days ago.  Patient denies any pain and her right leg.  She does have a previous history of TIAs.  I was not able to get much information from the patient herself.  I was also informed that patient has had issues with the medications in the past.  She has had a similar issue after taking pain medication.  She was able to tell this to the nurse yesterday.  They gave her tramadol this morning and Tylenol since then.  Objective: Patient is alert and oriented to person and place, in no acute distress, pleasant  On exam of her right lower extremity the incision site dressings are clean dry and intact.  No excessive swelling in the right lower extremity.  Calf is supple.  She able dorsiflex and plantarflex her right ankle with no pain.  She did move all of her toes.  Negative Homans sign.  Patient does have some slightly decreased strength in the right upper extremity compared to the left.  Unable to hold right arm up in the air for a long period of time.  Smiles even.  Assessment/ Plan:  1.Acute Delirium of unknown causes Patient does seem to be doing slightly better since about 45 minutes ago.   Fluids have been restarted.  I ordered a CBC, CMP and a head CT to rule out TIA.   I also consulted hospitalist, Dr. Roel Cluck, to come and see patient She also has put in other  orders. If there are any new or worsening symptoms appear overnight please call.

## 2019-06-24 NOTE — Plan of Care (Signed)
  Problem: Clinical Measurements: Goal: Ability to maintain clinical measurements within normal limits will improve Outcome: Progressing Goal: Will remain free from infection Outcome: Progressing   Problem: Activity: Goal: Risk for activity intolerance will decrease Outcome: Progressing   Problem: Pain Managment: Goal: General experience of comfort will improve Outcome: Progressing   Problem: Safety: Goal: Ability to remain free from injury will improve Outcome: Progressing   Problem: Education: Goal: Knowledge of the prescribed therapeutic regimen will improve Outcome: Progressing   Problem: Activity: Goal: Range of joint motion will improve Outcome: Progressing

## 2019-06-24 NOTE — Consult Note (Signed)
[facility] Medical Consultation  Claire West [id number] DOB: [birth year] DOA: 06/22/2019 PCP: Sharilyn Sites, MD   Requesting physician: Dr. Wynelle Link Date of consultation: 06/24/19  Reason for consultation: acute necephalopathy  Impression/Recommendations Active Problems:   Hyponatremia   Hypokalemia   HTN (hypertension)   Acute metabolic encephalopathy   OA (osteoarthritis) of knee   1. Acute metabolic encephalopathy -with poor responsiveness at some time.  Differential diagnosis includes drug reaction would avoid narcotics as well as tramadol if possible. 2. History not consistent with true syncope.  Given questionable history of possible focal deficit although nonspecific would obtain MRI of the brain in the morning.  Discussed at night with neurology will also add EEG please consult neurology if there is any evidence of abnormalities.  Otherwise vitals have been stable.  No evidence of infectious process on chest x-ray ordered UA.  No evidence of hypercarbia on VBG.  CT head unremarkable Patient with known history of dementia and currently appears to be pleasantly confused consistent with sundowning.  Would continue supportive management and redirection rule out medical causes.  We will continue to follow with you  3. Hyponatremia mild -unlikely to produce acute mental status changes.  Rehydrate and repeat 4. Hypokalemia -will replace 5.  History of hypertension -currently on Norvasc 6.  Sp  recent knee replacement defer to primary team I will followup again tomorrow. Please contact me if I can be of assistance in the meanwhile. Thank you for this consultation.  Chief Complaint: Acute encephalopathy  HPI: 78 year old lady with history of gait abnormality and dementia mild followed by neurology baseline she is somewhat repetitive and repeats questions does not recall conversations.  She is unable to manage her finances and son helps with ADLs. She has had recent falls   Had a history of hitting her face with a walker.  She uses walker at her baseline.  Patient has baseline history of hypertension for which she takes Norvasc and Toprol she is on daily aspirin Takes Lexapro for history of depression To her hyperlipidemia for which she takes Pravachol  Patient undergone knee replacement by Dr. Wynelle Link  On 06/22/2019 He has been somewhat confused today.  Per family she have had confusion with prior admissions and have taken 5 to 4 days to recover from a dose of Dilaudid. Today she only had tramadol Last dose of narcotics was morphine  She has hx of TIA and primary team has ordered CT head Requesting medical consult for evaluation of acute metabolic encephalopathy  Today she had tramadol and robaxin at 5 AM She was noted to be out of it initially Nurse noted patient was able to eat dinner at 5 PM but by the shift change she was difficult to arouse. Her BP has been stable Right side weakness possibly left side weakness at 7:20 PM Right at 8:45 Pm She is now more alert Her BG was 130   Review of Systems:  Unable to provide detailed review of systems but states she has no chest pain shortness of breath  Past Medical History:  Diagnosis Date  . Arthritis   . Depression   . Gait instability    walks with cane  . GERD (gastroesophageal reflux disease)   . History of blood transfusion   . Hyperlipemia   . Hypertension   . Knee pain   . Metabolic encephalopathy   . Mild cognitive impairment with memory loss   . Seasonal allergies   . Skin cancer    Past  Surgical History:  Procedure Laterality Date  . ABDOMINAL HYSTERECTOMY    . ARTHROSCOPIC REPAIR ACL Left   . COLONOSCOPY    . COLONOSCOPY N/A 11/23/2015   Procedure: COLONOSCOPY;  Surgeon: Rogene Houston, MD;  Location: [facility];  Service: Endoscopy;  Laterality: N/A;  830  . EYE SURGERY Bilateral    Cataracts  . SKIN CANCER EXCISION     Right shoulder  . TOTAL KNEE ARTHROPLASTY Left  05/05/2018   Procedure: LEFT TOTAL KNEE ARTHROPLASTY;  Surgeon: Gaynelle Arabian, MD;  Location: [facility];  Service: Orthopedics;  Laterality: Left;  with block  . TOTAL KNEE ARTHROPLASTY Right 06/22/2019   Procedure: TOTAL KNEE ARTHROPLASTY;  Surgeon: Gaynelle Arabian, MD;  Location: [facility];  Service: Orthopedics;  Laterality: Right;  73min   Social History:  reports that she has never smoked. She has never used smokeless tobacco. She reports current alcohol use. She reports that she does not use drugs.  Allergies  Allergen Reactions  . Penicillins Anaphylaxis, Swelling and Rash    Oral rash/peeling Did it involve swelling of the face/tongue/throat, SOB, or low BP? Yes Did it involve sudden or severe rash/hives, skin peeling, or any reaction on the inside of your mouth or nose? Yes Did you need to seek medical attention at a hospital or doctor's office? Yes When did it last happen? Approximately 2017 If all above answers are "NO", may proceed with cephalosporin use. **Has tolerated Rocephin, Vantin since 2017**    . Adhesive [Tape] Itching and Rash    Irritation at site  . Codeine Hives and Rash  . Neosporin [Neomycin-Bacitracin Zn-Polymyx] Rash  . Sulfa Antibiotics Hives and Rash   Family History  Problem Relation Age of Onset  . Lung cancer Mother   . Lung cancer Sister     Prior to Admission medications   Medication Sig Start Date End Date Taking? Authorizing Provider  acetaminophen (TYLENOL) 500 MG tablet Take 500 mg by mouth at bedtime.    Yes [provider]  amLODipine (NORVASC) 5 MG tablet Take 2.5 mg by mouth at bedtime.  10/22/18  Yes [provider]  aspirin EC 81 MG tablet Take 81 mg by mouth daily.   Yes [provider]  cholecalciferol (VITAMIN D3) 25 MCG (1000 UT) tablet Take 1,000 Units by mouth daily.   Yes [provider]  escitalopram (LEXAPRO) 10 MG tablet Take 10 mg by mouth at bedtime. 03/07/18  Yes [provider]   fexofenadine (ALLEGRA) 180 MG tablet Take 180 mg by mouth daily as needed for allergies.    Yes [provider]  hydrocortisone cream 1 % Apply 1 application topically 2 (two) times daily as needed for itching.   Yes [provider]  Javier Docker Oil 500 MG CAPS Take 500 mg by mouth daily at 12 noon.   Yes [provider]  Melatonin 5 MG TABS Take 5 mg by mouth at bedtime as needed (sleep).   Yes [provider]  metoprolol succinate (TOPROL-XL) 100 MG 24 hr tablet Take 100 mg by mouth daily. Take with or immediately following a meal.    Yes [provider]  Multiple Vitamin (MULTIVITAMIN WITH MINERALS) TABS tablet Take 1 tablet by mouth daily at 12 noon. Women's Multivitamin   Yes [provider]  omeprazole (PRILOSEC) 20 MG capsule Take 20-40 mg by mouth See admin instructions. Take 40mg  in the AM and 20mg  in the PM. 04/18/15  Yes [provider]  potassium  chloride (K-DUR) 10 MEQ tablet Take 1 tablet (10 mEq total) by mouth daily. Patient taking differently: Take 10 mEq by mouth daily with supper.  11/04/17  Yes Noemi Chapel, MD  pravastatin (PRAVACHOL) 20 MG tablet Take 20 mg by mouth at bedtime.  03/10/19  Yes [provider]  Probiotic Product (PROBIOTIC PO) Take 1 capsule by mouth daily at 12 noon. 25 Billion   Yes [provider]  triamcinolone cream (KENALOG) 0.1 % Apply 1 application topically 2 (two) times daily as needed (itching).   Yes [provider]  TURMERIC PO Take 1 tablet by mouth daily at 12 noon.   Yes [provider]  ferrous sulfate 325 (65 FE) MG tablet Take 1 tablet (325 mg total) by mouth 3 (three) times daily with meals for 19 days. Take for two weeks as tolerated. 06/24/19 07/13/19  Maurice March, PA-C  methocarbamol (ROBAXIN) 500 MG tablet Take 1 tablet (500 mg total) by mouth every 6 (six) hours as needed for muscle spasms. 06/24/19   Maurice March, PA-C  rivaroxaban (XARELTO)  10 MG TABS tablet Take 1 tablet (10 mg total) by mouth daily with breakfast for 19 days. Take one tablet Xarelto once a day for three weeks following surgery. Then resume one baby Aspirin (81 mg) once a day. 06/24/19 07/13/19  Maurice March, PA-C  traMADol (ULTRAM) 50 MG tablet Take 1-2 tablets (50-100 mg total) by mouth every 6 (six) hours as needed for moderate pain. 06/24/19   Maurice March, PA-C   Physical Exam: Blood pressure (!) 157/67, pulse 94, temperature 99.2 F (37.3 C), resp. rate 17, height 5\' 6"  (1.676 m), weight 65.8 kg, SpO2 94 %. Vitals:   06/24/19 1943 06/24/19 2155  BP: (!) 148/95 (!) 157/67  Pulse: 96 94  Resp: 20 17  Temp: 99 F (37.2 C) 99.2 F (37.3 C)  SpO2: 92% 94%    1. General:  in No  Acute distress    Chronically ill -appearing 2. Psychological: Alert and  Oriented 3. Head/ENT:    Dry Mucous Membranes                          Head Non traumatic, neck supple                        Poor Dentition 4. SKIN:  decreased Skin turgor,  Skin clean Dry and intact no rash 5. Heart: Regular rate and rhythm no  Murmur, no Rub or gallop 6. Lungs:  , no wheezes or crackles   7. Abdomen: Soft,  non-tender, Non distended  bowel sounds present 8. Lower extremities: no clubbing, cyanosis, no edema 9. Neurologically   strength 5 out of 5 in all 4 extremities cranial nerves II through XII intact 10. MSK: Normal range of motion except right knee    Labs on Admission:  Basic Metabolic Panel: Recent Labs  Lab 06/23/19 0259 06/24/19 0236  NA 135 138  K 4.4 4.0  CL 101 106  CO2 25 27  GLUCOSE 147* 105*  BUN 12 20  CREATININE 0.79 0.88  CALCIUM 9.0 8.6*   Liver Function Tests: No results for input(s): AST, ALT, ALKPHOS, BILITOT, PROT, ALBUMIN in the last 168 hours. No results for input(s): LIPASE, AMYLASE in the last 168 hours. No results for input(s): AMMONIA in the last 168 hours. CBC: Recent Labs  Lab 06/23/19 0259 06/24/19 0236 06/24/19 2131  WBC  11.1* 13.0* 10.2  NEUTROABS  --   --  7.0  HGB 10.9* 9.1* 8.4*  HCT 31.8* 27.8* 24.4*  MCV 93.3 96.5 94.2  PLT 192 156 129*   Cardiac Enzymes: No results for input(s): CKTOTAL, CKMB, CKMBINDEX, TROPONINI in the last 168 hours. BNP: Invalid input(s): POCBNP CBG: Recent Labs  Lab 06/24/19 1939  GLUCAP 130*    Radiological Exams on Admission: Ct Head Wo Contrast  Result Date: 06/24/2019 CLINICAL DATA:  78 year old female status post total knee replacement yesterday with increasing altered mental status. EXAM: CT HEAD WITHOUT CONTRAST TECHNIQUE: Contiguous axial images were obtained from the base of the skull through the vertex without intravenous contrast. COMPARISON:  Head CT 03/13/2019. Brain MRI 03/13/2019. FINDINGS: Brain: Patchy and confluent bilateral cerebral white matter hypodensity is chronic. Chronic lacunar infarct in the right thalamus on series 2, image 14. Stable gray-white matter differentiation throughout the brain. No midline shift, ventriculomegaly, mass effect, evidence of mass lesion, intracranial hemorrhage or evidence of cortically based acute infarction. Vascular: Calcified atherosclerosis at the skull base. No suspicious intracranial vascular hyperdensity. Skull: No acute osseous abnormality identified. Sinuses/Orbits: Visualized paranasal sinuses and mastoids are stable and well pneumatized. Other: No acute orbit or scalp soft tissue finding. IMPRESSION: 1. No acute intracranial abnormality. 2. Stable non contrast CT appearance of advanced chronic small vessel disease. Electronically Signed   By: Genevie Ann M.D.   On: 06/24/2019 22:54   Dg Chest Port 1 View  Result Date: 06/24/2019 CLINICAL DATA:  Confusion, fever EXAM: PORTABLE CHEST 1 VIEW COMPARISON:  11/06/2017, 08/23/2018 FINDINGS: No consolidation or effusion. Mild cardiomegaly with aortic atherosclerosis. No pneumothorax. Calcified bilateral breast implants. IMPRESSION: No active disease.  Mild cardiomegaly  Electronically Signed   By: Donavan Foil M.D.   On: 06/24/2019 22:26    EKG: Independently reviewed.  No evidence of ischemia appears to be in sinus rhythm  Time spent: 60  Arrionna Serena    If 7PM-7AM, please contact night-coverage [email or web address] Password [email or web address] 06/25/2019, 1:29 AM

## 2019-06-24 NOTE — Progress Notes (Addendum)
   Subjective: 2 Days Post-Op Procedure(s) (LRB): TOTAL KNEE ARTHROPLASTY (Right) Patient reports pain as mild.   Patient seen in rounds for Dr. Wynelle Link. Patient is well, and has had no acute complaints or problems other than discomfort in the right knee. No acute events overnight. Patient with mild confusion regarding where she was and the surgery performed, which resolved somewhat with conversation. Denies nausea, vomiting. Voiding without difficulty, positive flatus.  Plan is to go Home after hospital stay.  Objective: Vital signs in last 24 hours: Temp:  [97.4 F (36.3 C)-98.2 F (36.8 C)] 98 F (36.7 C) (09/16 0524) Pulse Rate:  [70-86] 86 (09/16 0524) Resp:  [15-16] 16 (09/16 0524) BP: (120-147)/(69-77) 147/69 (09/16 0524) SpO2:  [89 %-100 %] 100 % (09/16 0556)  Intake/Output from previous day:  Intake/Output Summary (Last 24 hours) at 06/24/2019 0806 Last data filed at 06/24/2019 0524 Gross per 24 hour  Intake 1476.25 ml  Output 350 ml  Net 1126.25 ml    Intake/Output this shift: No intake/output data recorded.  Labs: Recent Labs    06/23/19 0259 06/24/19 0236  HGB 10.9* 9.1*   Recent Labs    06/23/19 0259 06/24/19 0236  WBC 11.1* 13.0*  RBC 3.41* 2.88*  HCT 31.8* 27.8*  PLT 192 156   Recent Labs    06/23/19 0259 06/24/19 0236  NA 135 138  K 4.4 4.0  CL 101 106  CO2 25 27  BUN 12 20  CREATININE 0.79 0.88  GLUCOSE 147* 105*  CALCIUM 9.0 8.6*   No results for input(s): LABPT, INR in the last 72 hours.  Exam: General - Patient is Alert and Appropriate Extremity - Neurologically intact Sensation intact distally Intact pulses distally Dorsiflexion/Plantar flexion intact Dressing/Incision - clean, dry, no drainage Motor Function - intact, moving foot and toes well on exam.   Past Medical History:  Diagnosis Date  . Arthritis   . Depression   . Gait instability    walks with cane  . GERD (gastroesophageal reflux disease)   . History of  blood transfusion   . Hyperlipemia   . Hypertension   . Knee pain   . Metabolic encephalopathy   . Mild cognitive impairment with memory loss   . Seasonal allergies   . Skin cancer     Assessment/Plan: 2 Days Post-Op Procedure(s) (LRB): TOTAL KNEE ARTHROPLASTY (Right) Active Problems:   OA (osteoarthritis) of knee  Estimated body mass index is 23.41 kg/m as calculated from the following:   Height as of this encounter: 5\' 6"  (1.676 m).   Weight as of this encounter: 65.8 kg. Advance diet Up with therapy D/C IV fluids  DVT Prophylaxis - Xarelto Weight-bearing as tolerated  Plan for discharge home today after 1-2 sessions of therapy if she is meeting her goals. She had mild confusion today on exam, which I believe may be related to her pain medication. We will discontinue oxycodone. Scheduled for OPPT at [facility] in [place]. She will be home after surgery with assistance from family and CNA. Follow up in the office in 2 weeks.   Griffith Citron, PA-C Orthopedic Surgery 06/24/2019, 8:06 AM

## 2019-06-24 NOTE — Progress Notes (Signed)
Physical Therapy Treatment Patient Details Name: Claire West MRN: [id number] DOB: 03-24-1941 Today's Date: 06/24/2019    History of Present Illness 78 y.o. female s/p R TKR on 06/22/19. PMH includes multiple falls, TIA, OA, MCI, hypertension, depression.    PT Comments    Pt performed poorly this session. Max multimodal cueing required. She had difficulty attending to task and following 1 step commands. Made RN aware that pt is not safe to d/c on today. Will continue to follow.    Follow Up Recommendations  Follow surgeon's recommendation for DC plan and follow-up therapies;Supervision/Assistance - 24 hour     Equipment Recommendations  None recommended by PT    Recommendations for Other Services       Precautions / Restrictions Precautions Precautions: Fall Required Braces or Orthoses: Knee Immobilizer - Right Knee Immobilizer - Right: Discontinue once straight leg raise with < 10 degree lag Restrictions Weight Bearing Restrictions: No Other Position/Activity Restrictions: WBAT    Mobility  Bed Mobility Overal bed mobility: Needs Assistance Bed Mobility: Supine to Sit     Supine to sit: Mod assist;HOB elevated     General bed mobility comments: Assist for trunk and R LE. Increased time. Multimodal cueing required.  Transfers Overall transfer level: Needs assistance Equipment used: Rolling walker (2 wheeled) Transfers: Sit to/from Stand Sit to Stand: Mod assist         General transfer comment: Assist to rise, stabilize, control descent. Max multimodal cueing required. Posterior bias.  Ambulation/Gait Ambulation/Gait assistance: Mod assist Gait Distance (Feet): 10 Feet Assistive device: Rolling walker (2 wheeled) Gait Pattern/deviations: Step-to pattern;Trunk flexed     General Gait Details: Max cues for posture, proper use of RW, distance from RW, attention to task. Followed with recliner for safety. Assist to stabilize pt and manage RW.   Stairs              Wheelchair Mobility    Modified Rankin (Stroke Patients Only)       Balance Overall balance assessment: Needs assistance         Standing balance support: Bilateral upper extremity supported Standing balance-Leahy Scale: Poor                              Cognition Arousal/Alertness: Awake/alert Behavior During Therapy: WFL for tasks assessed/performed Overall Cognitive Status: Impaired/Different from baseline Area of Impairment: Attention;Following commands;Safety/judgement;Problem solving                   Current Attention Level: Focused   Following Commands: Follows one step commands inconsistently Safety/Judgement: Decreased awareness of safety;Decreased awareness of deficits   Problem Solving: Slow processing;Decreased initiation;Difficulty sequencing;Requires verbal cues;Requires tactile cues        Exercises Total Joint Exercises Ankle Circles/Pumps: AROM;Both;10 reps;Supine Quad Sets: (pt unable to follow commands) Short Arc Quad: AAROM;Right;10 reps;Supine Heel Slides: AAROM;Right;10 reps;Supine Hip ABduction/ADduction: AAROM;Right;10 reps;Supine Goniometric ROM: ~10-55 degrees    General Comments        Pertinent Vitals/Pain Pain Assessment: Faces Faces Pain Scale: Hurts even more Pain Location: R knee Pain Descriptors / Indicators: Aching;Discomfort;Sore Pain Intervention(s): Limited activity within patient's tolerance;Repositioned    Home Living                      Prior Function            PT Goals (current goals can now be found in the care plan section) Progress  towards PT goals: Not progressing toward goals - comment(limited by cognition)    Frequency    7X/week      PT Plan Current plan remains appropriate    Co-evaluation              AM-PAC PT "6 Clicks" Mobility   Outcome Measure  Help needed turning from your back to your side while in a flat bed without using  bedrails?: A Lot Help needed moving from lying on your back to sitting on the side of a flat bed without using bedrails?: A Lot Help needed moving to and from a bed to a chair (including a wheelchair)?: A Lot Help needed standing up from a chair using your arms (e.g., wheelchair or bedside chair)?: A Lot Help needed to walk in hospital room?: A Lot Help needed climbing 3-5 steps with a railing? : Total 6 Click Score: 11    End of Session Equipment Utilized During Treatment: Gait belt;Right knee immobilizer Activity Tolerance: (limited by cogniton) Patient left: in chair;with call bell/phone within reach;with chair alarm set   PT Visit Diagnosis: Other abnormalities of gait and mobility (R26.89);Difficulty in walking, not elsewhere classified (R26.2)     Time: [id number] PT Time Calculation (min) (ACUTE ONLY): 26 min  Charges:  $Gait Training: 8-22 mins $Therapeutic Exercise: 8-22 mins                        Weston Anna, PT Acute Rehabilitation Services Pager: [phone] Office: [phone]

## 2019-06-24 NOTE — Progress Notes (Signed)
Physical Therapy Treatment Patient Details Name: Claire West MRN: [id number] DOB: 05-26-1941 Today's Date: 06/24/2019    History of Present Illness 78 y.o. female s/p R TKR on 06/22/19. PMH includes multiple falls, TIA, OA, MCI, hypertension, depression.    PT Comments    Pt is not improving. Cognition remains impaired. She was unable to ambulate this afternoon. She required significant +2 assist for bed mobility and transfers. She is not safe to d/c home at this time. Will continue to follow during hospital stay.     Follow Up Recommendations  Follow surgeon's recommendation for DC plan and follow-up therapies;Supervision/Assistance - 24 hour(May need to consider SNF placement if pt doesn't improve)     Equipment Recommendations  None recommended by PT    Recommendations for Other Services       Precautions / Restrictions Precautions Precautions: Fall Required Braces or Orthoses: Knee Immobilizer - Right Knee Immobilizer - Right: Discontinue once straight leg raise with < 10 degree lag Restrictions Weight Bearing Restrictions: No Other Position/Activity Restrictions: WBAT    Mobility  Bed Mobility Overal bed mobility: Needs Assistance Bed Mobility: Sit to Supine      Sit to supine: Max assist;+2 for physical assistance;+2 for safety/equipment   General bed mobility comments: Assist for trunk and bil LEs. Increased time. Multimodal cueing required.  Transfers Overall transfer level: Needs assistance Equipment used: Rolling walker (2 wheeled) Transfers: Sit to/from Stand Sit to Stand: Mod assist;+2 physical assistance;+2 safety/equipment Stand pivot transfers: Mod assist;+2 physical assistance;+2 safety/equipment       General transfer comment: Assist to rise, stabilize, control descent. Max multimodal cueing required. Posterior bias. Stand pivot, reclliner to bed. Pt unable to step with L LE so she performed heel-toe pivoting.  Ambulation/Gait   General  Gait Details: NT-unable to safely attempt this afternoon   Stairs             Wheelchair Mobility    Modified Rankin (Stroke Patients Only)       Balance Overall balance assessment: Needs assistance         Standing balance support: Bilateral upper extremity supported Standing balance-Leahy Scale: Poor                              Cognition Arousal/Alertness: Awake/alert(but drowsy) Behavior During Therapy: WFL for tasks assessed/performed Overall Cognitive Status: Impaired/Different from baseline Area of Impairment: Attention;Following commands;Safety/judgement;Problem solving                   Current Attention Level: Focused   Following Commands: Follows one step commands inconsistently Safety/Judgement: Decreased awareness of safety;Decreased awareness of deficits   Problem Solving: Slow processing;Decreased initiation;Difficulty sequencing;Requires verbal cues;Requires tactile cues        Exercises   General Comments        Pertinent Vitals/Pain Pain Assessment: Faces Faces Pain Scale: Hurts even more Pain Location: R knee Pain Descriptors / Indicators: Aching;Discomfort;Sore;Grimacing Pain Intervention(s): Limited activity within patient's tolerance;Monitored during session;Repositioned    Home Living                      Prior Function            PT Goals (current goals can now be found in the care plan section) Progress towards PT goals: Not progressing toward goals - comment(limited by cognition)    Frequency    7X/week      PT Plan Discharge  plan needs to be updated    Co-evaluation              AM-PAC PT "6 Clicks" Mobility   Outcome Measure  Help needed turning from your back to your side while in a flat bed without using bedrails?: A Lot Help needed moving from lying on your back to sitting on the side of a flat bed without using bedrails?: A Lot Help needed moving to and from a bed to a  chair (including a wheelchair)?: A Lot Help needed standing up from a chair using your arms (e.g., wheelchair or bedside chair)?: A Lot Help needed to walk in hospital room?: Total Help needed climbing 3-5 steps with a railing? : Total 6 Click Score: 10    End of Session Equipment Utilized During Treatment: Gait belt;Right knee immobilizer Activity Tolerance: (limited by cognition) Patient left: in bed;with call bell/phone within reach;with bed alarm set;with family/visitor present Nurse Communication: Mobility status PT Visit Diagnosis: Other abnormalities of gait and mobility (R26.89);Difficulty in walking, not elsewhere classified (R26.2)     Time: 1341-1410 PT Time Calculation (min) (ACUTE ONLY): 29 min  Charges:  $Gait Training: 8-22 mins $Therapeutic Exercise: 8-22 mins $Therapeutic Activity: 23-37 mins                       Weston Anna, PT Acute Rehabilitation Services Pager: [phone] Office: [phone]

## 2019-06-24 NOTE — Progress Notes (Signed)
Amber,PA and Dr.Aluisio aware of patients confusion and decline in PT mobility. Agreed to attempt to try and stay away from pain medications. Patients sister stated the same situation occurred after patients last surgery when she received dilaudid. Sister stated it took her 4-5 days to clear mentally again. Discontinue the discharge order and tylenol PO for pain, see new orders.

## 2019-06-25 ENCOUNTER — Inpatient Hospital Stay (HOSPITAL_COMMUNITY): Payer: Medicare Other

## 2019-06-25 DIAGNOSIS — G3184 Mild cognitive impairment, so stated: Secondary | ICD-10-CM

## 2019-06-25 DIAGNOSIS — F32A Depression, unspecified: Secondary | ICD-10-CM | POA: Diagnosis present

## 2019-06-25 DIAGNOSIS — F329 Major depressive disorder, single episode, unspecified: Secondary | ICD-10-CM | POA: Diagnosis present

## 2019-06-25 LAB — BASIC METABOLIC PANEL
Anion gap: 11 (ref 5–15)
BUN: 11 mg/dL (ref 8–23)
CO2: 24 mmol/L (ref 22–32)
Calcium: 8.5 mg/dL — ABNORMAL LOW (ref 8.9–10.3)
Chloride: 102 mmol/L (ref 98–111)
Creatinine, Ser: 0.62 mg/dL (ref 0.44–1.00)
GFR calc Af Amer: >60 mL/min (ref >60)
GFR calc non Af Amer: >60 mL/min (ref >60)
Glucose, Bld: 104 mg/dL — ABNORMAL HIGH (ref 70–99)
Potassium: 4.1 mmol/L (ref 3.5–5.1)
Sodium: 137 mmol/L (ref 135–145)

## 2019-06-25 LAB — URINALYSIS, ROUTINE W REFLEX MICROSCOPIC
Bilirubin Urine: NEGATIVE
Glucose, UA: NEGATIVE mg/dL
Hgb urine dipstick: NEGATIVE
Ketones, ur: NEGATIVE mg/dL
Leukocytes,Ua: NEGATIVE
Nitrite: NEGATIVE
Protein, ur: NEGATIVE mg/dL
Specific Gravity, Urine: 1.005 (ref 1.005–1.030)
pH: 7 (ref 5.0–8.0)

## 2019-06-25 LAB — CBC
HCT: 27 % — ABNORMAL LOW (ref 36.0–46.0)
Hemoglobin: 9.1 g/dL — ABNORMAL LOW (ref 12.0–15.0)
MCH: 32.4 pg (ref 26.0–34.0)
MCHC: 33.7 g/dL (ref 30.0–36.0)
MCV: 96.1 fL (ref 80.0–100.0)
Platelets: 139 10*3/uL — ABNORMAL LOW (ref 150–400)
RBC: 2.81 MIL/uL — ABNORMAL LOW (ref 3.87–5.11)
RDW: 12.4 % (ref 11.5–15.5)
WBC: 11.4 10*3/uL — ABNORMAL HIGH (ref 4.0–10.5)
nRBC: 0 % (ref 0.0–0.2)

## 2019-06-25 LAB — TROPONIN I (HIGH SENSITIVITY): Troponin I (High Sensitivity): 5 ng/L (ref <18)

## 2019-06-25 NOTE — Plan of Care (Signed)
°  Problem: Clinical Measurements: °Goal: Respiratory complications will improve °Outcome: Progressing °Goal: Cardiovascular complication will be avoided °Outcome: Progressing °  °Problem: Elimination: °Goal: Will not experience complications related to urinary retention °Outcome: Progressing °  °Problem: Safety: °Goal: Ability to remain free from injury will improve °Outcome: Progressing °  °

## 2019-06-25 NOTE — Progress Notes (Signed)
Pt gone to MRI at this time - EEG will be done tomorrow when schedule permits.

## 2019-06-25 NOTE — Progress Notes (Signed)
[facility]  PROGRESS NOTE  Claire West [id number] DOB: 1941-01-14 DOA: 06/22/2019 PCP: Claire Sites, MD  Brief History    Claire West is a 78 y.o. year old female with medical history significant for TIA, OSA, MCI, HTN, depression who is status post TKR on 06/22/2019.  Hospitalist service was consulted on 06/24/2019 due to change in mental status, decreased responsiveness and difficulty to arouse noticed by nursing.  A & P     Acute metabolic encephalopathy likely hospital-acquired delirium in patient with mild cognitive impairment.  Patient has had similar episodes of confusion after surgeries per family.  Seems consistent with sundowning given decreased attention/ability to reorient.  Patient has no focal deficits on my exam, MRI brain was ordered on 9/16 due to reported weakness none present currently (CT head with no acute normalities).  EEG currently pending though doubt seizure given no reports of urine incontinence/postictal episodes and no prior history of such.  Agree with avoiding further narcotic use, can consider scheduling Tylenol if pain control is difficult due to mental status.  No signs or symptoms of infection, UA/chest x-ray unremarkable.  We will continue to closely monitor   Right knee osteoarthritis, status post TKA on 9/14.  Per primary, on Xarelto for DVT   Hyponatremia, resolved.   Hypokalemia, resolved.   Hypertension, stable.  SBP 150s, closely monitor, continue home Norvasc, metoprolol   Depression, stable.  Continue Lexapro   I will followup again tomorrow. Please contact me if I can be of assistance in the meanwhile. Thank you for this consultation [facility] Direct contact: see [email or web address] (further directions at bottom of note if needed) 7PM-7AM contact night coverage as at bottom of note 06/25/2019, 2:52 PM  LOS: 3 days  Vitals:  Vitals:   06/25/19 0557 06/25/19 0826  BP: (!) 155/80   Pulse: 91 90  Resp:    Temp: 99.8  F (37.7 C)   SpO2: 98%     Exam:  Awake Alert, Oriented X to self only,  No new F.N deficits, Normal affect [place].AT Symmetrical Chest wall movement, Good air movement bilaterally, CTAB RRR,No Gallops,Rubs or new Murmurs +ve B.Sounds, Abd Soft, No tenderness,  No rebound - guarding or rigidity. No Cyanosis, Clubbing or edema, No new Rash or bruise  Dressing in place on right knee   I have personally reviewed the following:   Data Reviewed: Basic Metabolic Panel: Recent Labs  Lab 06/23/19 0259 06/24/19 0236 06/24/19 2131 06/25/19 0240  NA 135 138 134* 137  K 4.4 4.0 3.3* 4.1  CL 101 106 101 102  CO2 25 27 23 24   GLUCOSE 147* 105* 136* 104*  BUN 12 20 14 11   CREATININE 0.79 0.88 0.63 0.62  CALCIUM 9.0 8.6* 8.0* 8.5*   Liver Function Tests: Recent Labs  Lab 06/24/19 2131  AST 20  ALT 14  ALKPHOS 55  BILITOT 0.5  PROT 5.4*  ALBUMIN 2.9*   No results for input(s): LIPASE, AMYLASE in the last 168 hours. No results for input(s): AMMONIA in the last 168 hours. CBC: Recent Labs  Lab 06/23/19 0259 06/24/19 0236 06/24/19 2131 06/25/19 0240  WBC 11.1* 13.0* 10.2 11.4*  NEUTROABS  --   --  7.0  --   HGB 10.9* 9.1* 8.4* 9.1*  HCT 31.8* 27.8* 24.4* 27.0*  MCV 93.3 96.5 94.2 96.1  PLT 192 156 129* 139*   Cardiac Enzymes: No results for input(s): CKTOTAL, CKMB, CKMBINDEX, TROPONINI in the last 168 hours. BNP (last 3  results) No results for input(s): BNP in the last 8760 hours.  ProBNP (last 3 results) No results for input(s): PROBNP in the last 8760 hours.  CBG: Recent Labs  Lab 06/24/19 1939  GLUCAP 130*    Recent Results (from the past 240 hour(s))  SARS CORONAVIRUS 2 (TAT 6-24 HRS) Nasopharyngeal Nasopharyngeal Swab     Status: None   Collection Time: 06/18/19  7:11 AM   Specimen: Nasopharyngeal Swab  Result Value Ref Range Status   SARS Coronavirus 2 NEGATIVE NEGATIVE Final    Comment: (NOTE) SARS-CoV-2 target nucleic acids are NOT DETECTED. The  SARS-CoV-2 RNA is generally detectable in upper and lower respiratory specimens during the acute phase of infection. Negative results do not preclude SARS-CoV-2 infection, do not rule out co-infections with other pathogens, and should not be used as the sole basis for treatment or other patient management decisions. Negative results must be combined with clinical observations, patient history, and epidemiological information. The expected result is Negative. Fact Sheet for Patients: SugarRoll.be Fact Sheet for Healthcare Providers: https://www.woods-mathews.com/ This test is not yet approved or cleared by the [place] FDA and  has been authorized for detection and/or diagnosis of SARS-CoV-2 by FDA under an Emergency Use Authorization (EUA). This EUA will remain  in effect (meaning this test can be used) for the duration of the COVID-19 declaration under Section 56 4(b)(1) of the Act, 21 U.S.C. section 360bbb-3(b)(1), unless the authorization is terminated or revoked sooner. Performed at [facility], [place] [address] [place], [place] [address]      Studies: Ct Head Wo Contrast  Result Date: 06/24/2019 CLINICAL DATA:  78 year old female status post total knee replacement yesterday with increasing altered mental status. EXAM: CT HEAD WITHOUT CONTRAST TECHNIQUE: Contiguous axial images were obtained from the base of the skull through the vertex without intravenous contrast. COMPARISON:  Head CT 03/13/2019. Brain MRI 03/13/2019. FINDINGS: Brain: Patchy and confluent bilateral cerebral white matter hypodensity is chronic. Chronic lacunar infarct in the right thalamus on series 2, image 14. Stable gray-white matter differentiation throughout the brain. No midline shift, ventriculomegaly, mass effect, evidence of mass lesion, intracranial hemorrhage or evidence of cortically based acute infarction. Vascular: Calcified atherosclerosis at the skull  base. No suspicious intracranial vascular hyperdensity. Skull: No acute osseous abnormality identified. Sinuses/Orbits: Visualized paranasal sinuses and mastoids are stable and well pneumatized. Other: No acute orbit or scalp soft tissue finding. IMPRESSION: 1. No acute intracranial abnormality. 2. Stable non contrast CT appearance of advanced chronic small vessel disease. Electronically Signed   By: Genevie Ann M.D.   On: 06/24/2019 22:54   Dg Chest Port 1 View  Result Date: 06/24/2019 CLINICAL DATA:  Confusion, fever EXAM: PORTABLE CHEST 1 VIEW COMPARISON:  11/06/2017, 08/23/2018 FINDINGS: No consolidation or effusion. Mild cardiomegaly with aortic atherosclerosis. No pneumothorax. Calcified bilateral breast implants. IMPRESSION: No active disease.  Mild cardiomegaly Electronically Signed   By: Donavan Foil M.D.   On: 06/24/2019 22:26    Scheduled Meds:  amLODipine  2.5 mg Oral QHS   docusate sodium  100 mg Oral BID   escitalopram  10 mg Oral QHS   metoprolol succinate  100 mg Oral Daily   pantoprazole  40 mg Oral q morning - 10a   pantoprazole  80 mg Oral QPM   potassium chloride  10 mEq Oral Q supper   pravastatin  20 mg Oral QHS   rivaroxaban  10 mg Oral Q breakfast   Continuous Infusions:  sodium chloride  75 mL/hr at 06/25/19 1106   methocarbamol (ROBAXIN) IV      Active Problems:   Hyponatremia   Hypokalemia   HTN (hypertension)   Acute metabolic encephalopathy   OA (osteoarthritis) of knee      Desiree Hane  [facility]

## 2019-06-25 NOTE — Progress Notes (Addendum)
   Subjective: 3 Days Post-Op Procedure(s) (LRB): TOTAL KNEE ARTHROPLASTY (Right) Patient reports pain as mild.   Patient seen in rounds with Dr. Wynelle Link. Patient is having problems with confusion and disorientation. Had significant trouble being reoriented yesterday evening and medical consult ordered. Somewhat better this morning but not back to baseline. Voiding well. Positive flatus. No SOB or chest pain.  Plan is to go Home after hospital stay.  Objective: Vital signs in last 24 hours: Temp:  [99 F (37.2 C)-99.8 F (37.7 C)] 99.8 F (37.7 C) (09/17 0557) Pulse Rate:  [84-96] 91 (09/17 0557) Resp:  [16-20] 17 (09/16 2155) BP: (145-157)/(67-95) 155/80 (09/17 0557) SpO2:  [92 %-98 %] 98 % (09/17 0557)  Intake/Output from previous day:  Intake/Output Summary (Last 24 hours) at 06/25/2019 0716 Last data filed at 06/25/2019 0600 Gross per 24 hour  Intake 870.77 ml  Output 1600 ml  Net -729.23 ml     Labs: Recent Labs    06/23/19 0259 06/24/19 0236 06/24/19 2131 06/25/19 0240  HGB 10.9* 9.1* 8.4* 9.1*   Recent Labs    06/24/19 2131 06/25/19 0240  WBC 10.2 11.4*  RBC 2.59* 2.81*  HCT 24.4* 27.0*  PLT 129* 139*   Recent Labs    06/24/19 0236 06/24/19 2131  NA 138 134*  K 4.0 3.3*  CL 106 101  CO2 27 23  BUN 20 14  CREATININE 0.88 0.63  GLUCOSE 105* 136*  CALCIUM 8.6* 8.0*     EXAM General - Patient is Confused Extremity - Neurologically intact Intact pulses distally Dorsiflexion/Plantar flexion intact No cellulitis present Compartment soft Dressing/Incision - clean, dry, no drainage Motor Function - intact, moving foot and toes well on exam.   Past Medical History:  Diagnosis Date  . Arthritis   . Depression   . Gait instability    walks with cane  . GERD (gastroesophageal reflux disease)   . History of blood transfusion   . Hyperlipemia   . Hypertension   . Knee pain   . Metabolic encephalopathy   . Mild cognitive impairment with memory  loss   . Seasonal allergies   . Skin cancer     Assessment/Plan: 3 Days Post-Op Procedure(s) (LRB): TOTAL KNEE ARTHROPLASTY (Right) Active Problems:   Hyponatremia   Hypokalemia   HTN (hypertension)   Acute metabolic encephalopathy   OA (osteoarthritis) of knee  Estimated body mass index is 23.41 kg/m as calculated from the following:   Height as of this encounter: 5\' 6"  (1.676 m).   Weight as of this encounter: 65.8 kg. Advance diet Up with therapy Plan for discharge tomorrow pending improvement with mental status  DVT Prophylaxis - Aspirin Weight-Bearing as tolerated   Continue with medicine's plan for EEG and MRI of brain. Would like for her to get a couple of session of PT in as well. Likely that altered mental status is secondary to sundowning as she has had this same experience after previous TKA. We will watch her closely today for progress. No narcotics. Tylenol only.   Ardeen Jourdain, PA-C Orthopaedic Surgery 06/25/2019, 7:16 AM

## 2019-06-25 NOTE — Progress Notes (Signed)
Physical Therapy Treatment Patient Details Name: Claire West MRN: [id number] DOB: 1941/05/16 Today's Date: 06/25/2019    History of Present Illness 78 y.o. female s/p R TKR on 06/22/19. PMH includes multiple falls, TIA, OA, MCI, hypertension, depression.    PT Comments    POD # 3 pm session Pt familiar to me from last year.  Had same issue of impaired cognition after surgery which did improve after several days. Assisted out of recliner to [facility] to void.   General transfer comment: assisted from recliner to [facility] then from [facility] to amb.  75% VC's on proper hand placement and turn completion.  Slightly delayed motor and verbal responce but improving.  General Gait Details: tolerated an increased distance.  50% VC's to "step up" into walker as pt tends to push walker too far.  75% VC's on safety with turns and hand placement with stand to sit. Assisted back to bed.  Applied ICE.     Follow Up Recommendations  Follow surgeon's recommendation for DC plan and follow-up therapies;Supervision/Assistance - 24 hour(May need to consider SNF placement if pt doesn't improve)     Equipment Recommendations  None recommended by PT    Recommendations for Other Services       Precautions / Restrictions Precautions Precautions: Fall Precaution Comments: post op AMS Required Braces or Orthoses: Knee Immobilizer - Right Knee Immobilizer - Right: Discontinue once straight leg raise with < 10 degree lag Restrictions Weight Bearing Restrictions: No Other Position/Activity Restrictions: WBAT    Mobility  Bed Mobility Overal bed mobility: Needs Assistance Bed Mobility: Sit to Supine     Supine to sit: +2 for physical assistance;Max assist Sit to supine: Max assist   General bed mobility comments: Assist for trunk and bil LEs. Increased time. Multimodal cueing required.  Transfers Overall transfer level: Needs assistance Equipment used: Rolling walker (2 wheeled) Transfers: Sit to/from Stand Sit  to Stand: Mod assist;+2 physical assistance;+2 safety/equipment Stand pivot transfers: Mod assist;+2 physical assistance;+2 safety/equipment       General transfer comment: assisted from recliner to [facility] then from [facility] to amb.  75% VC's on proper hand placement and turn completion.  Slightly delayed motor and verbal responce but improving  Ambulation/Gait Ambulation/Gait assistance: Min assist;Mod assist Gait Distance (Feet): 50 Feet(25 feet x 2 one seated) Assistive device: Rolling walker (2 wheeled) Gait Pattern/deviations: Step-to pattern;Step-through pattern Gait velocity: decreased   General Gait Details: tolerated an increased distance.  50% VC's to "step up" into walker as pt tends to push walker too far.  75% VC's on safety with turns and hand placement with stand to sit.   Stairs             Wheelchair Mobility    Modified Rankin (Stroke Patients Only)       Balance Overall balance assessment: Needs assistance         Standing balance support: Bilateral upper extremity supported Standing balance-Leahy Scale: Poor                              Cognition Arousal/Alertness: Awake/alert Behavior During Therapy: WFL for tasks assessed/performed Overall Cognitive Status: Impaired/Different from baseline Area of Impairment: Attention;Following commands;Safety/judgement;Problem solving                   Current Attention Level: Focused   Following Commands: Follows one step commands inconsistently Safety/Judgement: Decreased awareness of safety;Decreased awareness of deficits   Problem Solving:  Slow processing;Decreased initiation;Difficulty sequencing;Requires verbal cues;Requires tactile cues General Comments: improving cognition, following repeat functional commands.      Exercises Total Joint Exercises Ankle Circles/Pumps: AROM;Both;10 reps;Supine Quad Sets: (pt unable to follow commands) Heel Slides: AAROM;Right;10 reps;Supine     General Comments        Pertinent Vitals/Pain Faces Pain Scale: Hurts a little bit Pain Location: R knee with activity Pain Descriptors / Indicators: Grimacing;Guarding Pain Intervention(s): Monitored during session;Repositioned;Ice applied    Home Living                      Prior Function            PT Goals (current goals can now be found in the care plan section) Acute Rehab PT Goals Patient Stated Goal: decrease pain PT Goal Formulation: With patient Time For Goal Achievement: 06/29/19 Potential to Achieve Goals: Fair Progress towards PT goals: Progressing toward goals    Frequency    7X/week      PT Plan Discharge plan needs to be updated    Co-evaluation              AM-PAC PT "6 Clicks" Mobility   Outcome Measure  Help needed turning from your back to your side while in a flat bed without using bedrails?: A Lot Help needed moving from lying on your back to sitting on the side of a flat bed without using bedrails?: A Lot Help needed moving to and from a bed to a chair (including a wheelchair)?: A Lot Help needed standing up from a chair using your arms (e.g., wheelchair or bedside chair)?: A Lot Help needed to walk in hospital room?: A Lot Help needed climbing 3-5 steps with a railing? : Total 6 Click Score: 11    End of Session Equipment Utilized During Treatment: Gait belt Activity Tolerance: Patient tolerated treatment well Patient left: in bed;with call bell/phone within reach Nurse Communication: Mobility status PT Visit Diagnosis: Other abnormalities of gait and mobility (R26.89);Difficulty in walking, not elsewhere classified (R26.2)     Time: [id number] PT Time Calculation (min) (ACUTE ONLY): 29 min  Charges:  $Gait Training: 8-22 mins $Therapeutic Activity: 8-22 mins                     Rica Koyanagi  PTA Acute  Rehabilitation Services Pager      [phone] Office      [phone]

## 2019-06-25 NOTE — Progress Notes (Signed)
Physical Therapy Treatment Patient Details Name: Claire West MRN: [id number] DOB: 15-Sep-1941 Today's Date: 06/25/2019    History of Present Illness 78 y.o. female s/p R TKR on 06/22/19. PMH includes multiple falls, TIA, OA, MCI, hypertension, depression.    PT Comments    Pt lethargic and difficult to arouse, she did arouse to verbal/tactile/painful stimulii. +2 max assist for supine to sit, pt ambulated 6' with RW with +2 mod assist to advance BLEs and for balance.   Follow Up Recommendations  Follow surgeon's recommendation for DC plan and follow-up therapies;Supervision/Assistance - 24 hour(May need to consider SNF placement if pt doesn't improve)     Equipment Recommendations  None recommended by PT    Recommendations for Other Services       Precautions / Restrictions Precautions Precautions: Fall Required Braces or Orthoses: Knee Immobilizer - Right Knee Immobilizer - Right: Discontinue once straight leg raise with < 10 degree lag Restrictions Weight Bearing Restrictions: No Other Position/Activity Restrictions: WBAT    Mobility  Bed Mobility Overal bed mobility: Needs Assistance Bed Mobility: Supine to Sit     Supine to sit: +2 for physical assistance;Max assist     General bed mobility comments: Assist for trunk and bil LEs. Increased time. Multimodal cueing required.  Transfers Overall transfer level: Needs assistance Equipment used: Rolling walker (2 wheeled) Transfers: Sit to/from Stand Sit to Stand: Mod assist;+2 physical assistance;+2 safety/equipment         General transfer comment: Assist to rise, stabilize, control descent. Max multimodal cueing required. Posterior bias.  Ambulation/Gait Ambulation/Gait assistance: Mod assist;+2 physical assistance;+2 safety/equipment Gait Distance (Feet): 6 Feet Assistive device: Rolling walker (2 wheeled) Gait Pattern/deviations: Step-to pattern Gait velocity: decr   General Gait Details: assist to  advance BLE, continuous verbal and manual cues for sequencing, distance limited by pain   Stairs             Wheelchair Mobility    Modified Rankin (Stroke Patients Only)       Balance Overall balance assessment: Needs assistance         Standing balance support: Bilateral upper extremity supported Standing balance-Leahy Scale: Poor                              Cognition Arousal/Alertness: Awake/alert(difficult to arouse initially, but aroused with pain) Behavior During Therapy: WFL for tasks assessed/performed Overall Cognitive Status: Impaired/Different from baseline Area of Impairment: Attention;Following commands;Safety/judgement;Problem solving                   Current Attention Level: Focused   Following Commands: Follows one step commands inconsistently Safety/Judgement: Decreased awareness of safety;Decreased awareness of deficits   Problem Solving: Slow processing;Decreased initiation;Difficulty sequencing;Requires verbal cues;Requires tactile cues General Comments: pt repeats same question multiple times, no recall of instructions for sequencing with ambulation      Exercises Total Joint Exercises Ankle Circles/Pumps: AROM;Both;10 reps;Supine Quad Sets: (pt unable to follow commands) Heel Slides: AAROM;Right;10 reps;Supine    General Comments        Pertinent Vitals/Pain Faces Pain Scale: Hurts whole lot Pain Location: R knee with activity Pain Descriptors / Indicators: Grimacing;Guarding Pain Intervention(s): Limited activity within patient's tolerance;Monitored during session;Premedicated before session(pt had tylenol prior to session, narcotics being held due to AMS)    Home Living                      Prior Function  PT Goals (current goals can now be found in the care plan section) Acute Rehab PT Goals Patient Stated Goal: decrease pain PT Goal Formulation: With patient Time For Goal  Achievement: 06/29/19 Potential to Achieve Goals: Fair Progress towards PT goals: Progressing toward goals    Frequency    7X/week      PT Plan Discharge plan needs to be updated    Co-evaluation              AM-PAC PT "6 Clicks" Mobility   Outcome Measure  Help needed turning from your back to your side while in a flat bed without using bedrails?: A Lot Help needed moving from lying on your back to sitting on the side of a flat bed without using bedrails?: A Lot Help needed moving to and from a bed to a chair (including a wheelchair)?: A Lot Help needed standing up from a chair using your arms (e.g., wheelchair or bedside chair)?: A Lot Help needed to walk in hospital room?: A Lot Help needed climbing 3-5 steps with a railing? : Total 6 Click Score: 11    End of Session Equipment Utilized During Treatment: Gait belt Activity Tolerance: Patient limited by pain(limited by cognition) Patient left: with call bell/phone within reach;in chair;with chair alarm set Nurse Communication: Mobility status PT Visit Diagnosis: Other abnormalities of gait and mobility (R26.89);Difficulty in walking, not elsewhere classified (R26.2)     Time: [id number] PT Time Calculation (min) (ACUTE ONLY): 16 min  Charges:  $Gait Training: 8-22 mins                     Blondell Reveal Kistler PT 06/25/2019  Acute Rehabilitation Services Pager [phone] Office [phone]

## 2019-06-25 NOTE — Care Management Important Message (Signed)
Important Message  Patient Details IM Letter given to Velva Harman RN to present to the Patient Name: Claire West MRN: [id number] Date of Birth: 02/14/1941   Medicare Important Message Given:  Yes     Kerin Salen 06/25/2019, 10:33 AM

## 2019-06-25 NOTE — Care Management Note (Signed)
OP PT appointment rescheduled at [facility] for 06-30-19 at 11:00 am.

## 2019-06-26 MED ORDER — AMLODIPINE BESYLATE 5 MG PO TABS: 5.0000 mg | ORAL_TABLET | Freq: Every day | ORAL | Status: DC

## 2019-06-26 MED ORDER — HYDRALAZINE HCL 20 MG/ML IJ SOLN: 5.0000 mg | Freq: Four times a day (QID) | INTRAMUSCULAR | Status: DC | PRN

## 2019-06-26 MED ORDER — AMLODIPINE BESYLATE 5 MG PO TABS: 5.0000 mg | ORAL_TABLET | Freq: Every day | ORAL | 1 refills | Status: DC

## 2019-06-26 NOTE — Progress Notes (Signed)
Physical Therapy Treatment Patient Details Name: Claire West MRN: [id number] DOB: August 13, 1941 Today's Date: 06/26/2019    History of Present Illness 78 y.o. female s/p R TKR on 06/22/19. PMH includes multiple falls, TIA, OA, MCI, hypertension, depression.    PT Comments    POD # 4 am session Assisted OOB to [facility], amb then to recliner.  General transfer comment: 50% VC's on proper hand placement and safety with turns    Assisted on/off BSC.  General Gait Details: tolerated an increased distance.  50% VC's to "step up" into walker as pt tends to push walker too far.  75% VC's on safety with turns and hand placement with stand to sit. Pt will need another session to address stair training with family.   Follow Up Recommendations  Follow surgeon's recommendation for DC plan and follow-up therapies;Supervision/Assistance - 24 hour     Equipment Recommendations  None recommended by PT    Recommendations for Other Services       Precautions / Restrictions Precautions Precautions: Fall Precaution Comments: post op AMS improving Restrictions Weight Bearing Restrictions: No Other Position/Activity Restrictions: WBAT    Mobility  Bed Mobility Overal bed mobility: Needs Assistance Bed Mobility: Supine to Sit     Supine to sit: Mod assist;Max assist     General bed mobility comments: assist to scoot to EOB  Transfers Overall transfer level: Needs assistance Equipment used: Rolling walker (2 wheeled) Transfers: Sit to/from Stand Sit to Stand: Min assist Stand pivot transfers: Min assist;Mod assist       General transfer comment: 50% VC's on proper hand placement and safety with turns    Assisted on/off BSC  Ambulation/Gait Ambulation/Gait assistance: Min assist;Min guard Gait Distance (Feet): 42 Feet   Gait Pattern/deviations: Step-to pattern;Step-through pattern Gait velocity: decreased   General Gait Details: tolerated an increased distance.  50% VC's to "step up"  into walker as pt tends to push walker too far.  75% VC's on safety with turns and hand placement with stand to sit.   Stairs             Wheelchair Mobility    Modified Rankin (Stroke Patients Only)       Balance                                            Cognition Arousal/Alertness: Awake/alert   Overall Cognitive Status: Impaired/Different from baseline                                 General Comments: improving cognition, following repeat functional commands.      Exercises      General Comments        Pertinent Vitals/Pain Pain Assessment: Faces Faces Pain Scale: Hurts a little bit Pain Location: R knee with activity Pain Descriptors / Indicators: Grimacing;Guarding Pain Intervention(s): Monitored during session;Repositioned    Home Living                      Prior Function            PT Goals (current goals can now be found in the care plan section) Progress towards PT goals: Progressing toward goals    Frequency    7X/week      PT Plan Discharge plan needs to  be updated    Co-evaluation              AM-PAC PT "6 Clicks" Mobility   Outcome Measure  Help needed turning from your back to your side while in a flat bed without using bedrails?: A Little Help needed moving from lying on your back to sitting on the side of a flat bed without using bedrails?: A Little Help needed moving to and from a bed to a chair (including a wheelchair)?: A Little Help needed standing up from a chair using your arms (e.g., wheelchair or bedside chair)?: A Little Help needed to walk in hospital room?: A Little Help needed climbing 3-5 steps with a railing? : A Lot 6 Click Score: 17    End of Session Equipment Utilized During Treatment: Gait belt Activity Tolerance: Patient tolerated treatment well Patient left: in chair;with call bell/phone within reach;with chair alarm set Nurse Communication: Mobility  status PT Visit Diagnosis: Other abnormalities of gait and mobility (R26.89);Difficulty in walking, not elsewhere classified (R26.2)     Time: 1126-1150 PT Time Calculation (min) (ACUTE ONLY): 24 min  Charges:  $Gait Training: 8-22 mins $Therapeutic Activity: 8-22 mins                     {Evone Arseneau  PTA Acute  Rehabilitation Services Pager      [phone] Office      [phone]

## 2019-06-26 NOTE — Progress Notes (Signed)
Physical Therapy Treatment Patient Details Name: Claire West MRN: [id number] DOB: [birth year] Today's Date: 06/26/2019    History of Present Illness 78 y.o. female s/p R TKR on 06/22/19. PMH includes multiple falls, TIA, OA, MCI, hypertension, depression.    PT Comments    POD # 4 pm session Sister present for education.  Pt had TKR last year, sister very familiar with care.  Assisted out of recliner to [facility].  Sister stated she helps pt to [facility] evey hour or so to keep her dry.  Assisted with amb.  General Gait Details: tolerated an increased distance.  50% VC's to "step up" into walker as pt tends to push walker too far.  75% VC's on safety with turns and hand placement with stand to sit..  Assisted with stair training.  General stair comments: 50% VC's on proper sequencing practiced with sister "hands on".  Addressed all mobility questions, discussed appropriate activity, educated on use of ICE.  Pt ready for D/C to home.   Follow Up Recommendations  Follow surgeon's recommendation for DC plan and follow-up therapies;Supervision/Assistance - 24 hour     Equipment Recommendations  None recommended by PT    Recommendations for Other Services       Precautions / Restrictions Precautions Precautions: Fall Precaution Comments: post op AMS improving Restrictions Weight Bearing Restrictions: No Other Position/Activity Restrictions: WBAT    Mobility  Bed Mobility Overal bed mobility: Needs Assistance Bed Mobility: Supine to Sit     Supine to sit: Mod assist;Max assist     General bed mobility comments: OOB in recliner  Transfers Overall transfer level: Needs assistance Equipment used: Rolling walker (2 wheeled) Transfers: Sit to/from Stand Sit to Stand: Min assist Stand pivot transfers: Min assist;Mod assist       General transfer comment: 50% VC's on proper hand placement and safety with turns    Assisted on/off BSC  Ambulation/Gait Ambulation/Gait assistance: Min  assist;Min guard Gait Distance (Feet): 16 Feet   Gait Pattern/deviations: Step-to pattern;Step-through pattern Gait velocity: decreased   General Gait Details: tolerated an increased distance.  50% VC's to "step up" into walker as pt tends to push walker too far.  75% VC's on safety with turns and hand placement with stand to sit.   Stairs Stairs: Yes Stairs assistance: Min assist Stair Management: No rails;Step to pattern;Forwards;With walker Number of Stairs: 2 General stair comments: 50% VC's on proper sequencing practiced with sister "hands on"   Wheelchair Mobility    Modified Rankin (Stroke Patients Only)       Balance                                            Cognition Arousal/Alertness: Awake/alert   Overall Cognitive Status: Impaired/Different from baseline                                 General Comments: improving cognition, following repeat functional commands.      Exercises      General Comments        Pertinent Vitals/Pain Pain Assessment: Faces Faces Pain Scale: Hurts a little bit Pain Location: R knee with activity Pain Descriptors / Indicators: Grimacing;Guarding Pain Intervention(s): Monitored during session;Repositioned    Home Living  Prior Function            PT Goals (current goals can now be found in the care plan section) Progress towards PT goals: Progressing toward goals    Frequency    7X/week      PT Plan Discharge plan needs to be updated    Co-evaluation              AM-PAC PT "6 Clicks" Mobility   Outcome Measure  Help needed turning from your back to your side while in a flat bed without using bedrails?: A Little Help needed moving from lying on your back to sitting on the side of a flat bed without using bedrails?: A Little Help needed moving to and from a bed to a chair (including a wheelchair)?: A Little Help needed standing up from a  chair using your arms (e.g., wheelchair or bedside chair)?: A Little Help needed to walk in hospital room?: A Little Help needed climbing 3-5 steps with a railing? : A Lot 6 Click Score: 17    End of Session Equipment Utilized During Treatment: Gait belt Activity Tolerance: Patient tolerated treatment well Patient left: in chair;with call bell/phone within reach;with chair alarm set Nurse Communication: Mobility status PT Visit Diagnosis: Other abnormalities of gait and mobility (R26.89);Difficulty in walking, not elsewhere classified (R26.2)     Time: 1325-1350 PT Time Calculation (min) (ACUTE ONLY): 25 min  Charges:  $Gait Training: 8-22 mins $Therapeutic Activity: 8-22 mins                     Rica Koyanagi  PTA Acute  Rehabilitation Services Pager      [phone] Office      [phone]

## 2019-06-26 NOTE — Progress Notes (Addendum)
   Subjective: 4 Days Post-Op Procedure(s) (LRB): TOTAL KNEE ARTHROPLASTY (Right) Patient reports pain as mild.   Patient seen in rounds with Dr. Wynelle Link. Patient is well, and has had no acute complaints or problems. She has had problems the last couple days with confusion and post op delirium. MRI brain normal as well as CT. EEG ordered. Voiding well. Positive BM. No SOB or chest pain.    Objective: Vital signs in last 24 hours: Temp:  [98.4 F (36.9 C)-98.7 F (37.1 C)] 98.4 F (36.9 C) (09/18 0552) Pulse Rate:  [86-93] 86 (09/18 0552) Resp:  [16-19] 18 (09/18 0552) BP: (137-182)/(63-75) 169/74 (09/18 0552) SpO2:  [94 %-99 %] 94 % (09/18 0552)  Intake/Output from previous day:  Intake/Output Summary (Last 24 hours) at 06/26/2019 0716 Last data filed at 06/26/2019 0600 Gross per 24 hour  Intake 1703.12 ml  Output 2325 ml  Net -621.88 ml     Labs: Recent Labs    06/24/19 0236 06/24/19 2131 06/25/19 0240  HGB 9.1* 8.4* 9.1*   Recent Labs    06/24/19 2131 06/25/19 0240  WBC 10.2 11.4*  RBC 2.59* 2.81*  HCT 24.4* 27.0*  PLT 129* 139*   Recent Labs    06/24/19 2131 06/25/19 0240  NA 134* 137  K 3.3* 4.1  CL 101 102  CO2 23 24  BUN 14 11  CREATININE 0.63 0.62  GLUCOSE 136* 104*  CALCIUM 8.0* 8.5*    EXAM General - Patient is Alert and Oriented Extremity - Neurologically intact Intact pulses distally Dorsiflexion/Plantar flexion intact No cellulitis present Compartment soft Dressing/Incision - clean, dry, no drainage Motor Function - intact, moving foot and toes well on exam.   Past Medical History:  Diagnosis Date  . Arthritis   . Depression   . Gait instability    walks with cane  . GERD (gastroesophageal reflux disease)   . History of blood transfusion   . Hyperlipemia   . Hypertension   . Knee pain   . Metabolic encephalopathy   . Mild cognitive impairment with memory loss   . Seasonal allergies   . Skin cancer     Assessment/Plan:  4 Days Post-Op Procedure(s) (LRB): TOTAL KNEE ARTHROPLASTY (Right) Active Problems:   Hyponatremia   Hypokalemia   HTN (hypertension)   Acute metabolic encephalopathy   OA (osteoarthritis) of knee   Mild cognitive impairment   Depression  Estimated body mass index is 23.41 kg/m as calculated from the following:   Height as of this encounter: 5\' 6"  (1.676 m).   Weight as of this encounter: 65.8 kg. Advance diet Up with therapy  DVT Prophylaxis - Aspirin Weight-Bearing as tolerated   Hopeful for DC home today. She is much more alert and clear today. No confusion during conversation during rounds. Does not appear that she needs EEG at this point but will let Dr. Wynelle Link make final decision. Follow up in office in 10 days. Discharge instructions given.   Ardeen Jourdain, PA-C Orthopaedic Surgery 06/26/2019, 7:16 AM   I have seen and examined the patient and she is back at baseline and motivated to go home today as long as she does well with PT. Given no acute findings on the CT and MRI we were most likely dealing with oversedation from the analgesics. I don't feel like we need to pursue the EEG and would cancel it if the hospitalist team agrees  Gaynelle Arabian, MD

## 2019-06-26 NOTE — Progress Notes (Signed)
[facility]  PROGRESS NOTE  Claire West [id number] DOB: [birth year] DOA: 06/22/2019 PCP: Sharilyn Sites, MD  Brief History    Claire West is a 78 y.o. year old female with medical history significant for TIA, OSA, MCI, HTN, depression who is status post TKR on 06/22/2019.  Hospitalist service was consulted on 06/24/2019 due to change in mental status, decreased responsiveness and difficulty to arouse noticed by nursing.Patient has had similar episodes of confusion after surgeries per family.  Seems consistent with sundowning given decreased attention/ability to reorient.  Patient has no focal deficits on my exam, MRI brain was ordered on 9/16 due to reported weakness though no longer has any weakness currently (CT head with no acute normalities). MRI brain negative for acute pathology. Does show atrophy and advanced small vessel ischemic disease.   A & P     Acute metabolic encephalopathy likely hospital-acquired delirium in patient with mild cognitive impairment, resolved.  Likely combination of sedative effect of pain control  Medications as well as hospital acquired delirium. She is alert and oriented x 4. Agree no need for EEG given resolution of confusion and no concern for seizure like activity/postictal phase    Agree with avoiding further narcotic use   Right knee osteoarthritis, status post TKA on 9/14.  Per primary, on Xarelto for DVT   Hyponatremia, resolved.   Hypokalemia, resolved.   Hypertension, Elevated SBP in 160s. Advise increasing home norvasc to 5 mg on discharge. Continue home metoprolol   Depression, stable.  Continue Lexapro    Mild Cognitive impairment. Likely vascular related based off MRI Brain imaging. Continue outpatient follow up with neurology.    We will sign off. Thank you for this consultation [facility] Direct contact: see [email or web address] (further directions at bottom of note if needed) 7PM-7AM contact night coverage as at  bottom of note 06/26/2019, 11:22 AM  LOS: 4 days  Vitals:  Vitals:   06/26/19 0552 06/26/19 0818  BP: (!) 169/74 (!) 162/76  Pulse: 86 85  Resp: 18   Temp: 98.4 F (36.9 C)   SpO2: 94%     Exam:  Awake Alert, Oriented X to self, person, place, time, context,  No new F.N deficits, Normal affect .AT Symmetrical Chest wall movement, Good air movement bilaterally, CTAB RRR,No Gallops,Rubs or new Murmurs +ve B.Sounds, Abd Soft, No tenderness,  No rebound - guarding or rigidity. No Cyanosis, Clubbing or edema, No new Rash or bruise  Dressing in place on right knee   I have personally reviewed the following:   Data Reviewed: Basic Metabolic Panel: Recent Labs  Lab 06/23/19 0259 06/24/19 0236 06/24/19 2131 06/25/19 0240  NA 135 138 134* 137  K 4.4 4.0 3.3* 4.1  CL 101 106 101 102  CO2 25 27 23 24   GLUCOSE 147* 105* 136* 104*  BUN 12 20 14 11   CREATININE 0.79 0.88 0.63 0.62  CALCIUM 9.0 8.6* 8.0* 8.5*   Liver Function Tests: Recent Labs  Lab 06/24/19 2131  AST 20  ALT 14  ALKPHOS 55  BILITOT 0.5  PROT 5.4*  ALBUMIN 2.9*   No results for input(s): LIPASE, AMYLASE in the last 168 hours. No results for input(s): AMMONIA in the last 168 hours. CBC: Recent Labs  Lab 06/23/19 0259 06/24/19 0236 06/24/19 2131 06/25/19 0240  WBC 11.1* 13.0* 10.2 11.4*  NEUTROABS  --   --  7.0  --   HGB 10.9* 9.1* 8.4* 9.1*  HCT 31.8* 27.8* 24.4* 27.0*  MCV  93.3 96.5 94.2 96.1  PLT 192 156 129* 139*   Cardiac Enzymes: No results for input(s): CKTOTAL, CKMB, CKMBINDEX, TROPONINI in the last 168 hours. BNP (last 3 results) No results for input(s): BNP in the last 8760 hours.  ProBNP (last 3 results) No results for input(s): PROBNP in the last 8760 hours.  CBG: Recent Labs  Lab 06/24/19 1939  GLUCAP 130*    Recent Results (from the past 240 hour(s))  SARS CORONAVIRUS 2 (TAT 6-24 HRS) Nasopharyngeal Nasopharyngeal Swab     Status: None   Collection Time: 06/18/19   7:11 AM   Specimen: Nasopharyngeal Swab  Result Value Ref Range Status   SARS Coronavirus 2 NEGATIVE NEGATIVE Final    Comment: (NOTE) SARS-CoV-2 target nucleic acids are NOT DETECTED. The SARS-CoV-2 RNA is generally detectable in upper and lower respiratory specimens during the acute phase of infection. Negative results do not preclude SARS-CoV-2 infection, do not rule out co-infections with other pathogens, and should not be used as the sole basis for treatment or other patient management decisions. Negative results must be combined with clinical observations, patient history, and epidemiological information. The expected result is Negative. Fact Sheet for Patients: SugarRoll.be Fact Sheet for Healthcare Providers: https://www.woods-mathews.com/ This test is not yet approved or cleared by the [place] FDA and  has been authorized for detection and/or diagnosis of SARS-CoV-2 by FDA under an Emergency Use Authorization (EUA). This EUA will remain  in effect (meaning this test can be used) for the duration of the COVID-19 declaration under Section 56 4(b)(1) of the Act, 21 U.S.C. section 360bbb-3(b)(1), unless the authorization is terminated or revoked sooner. Performed at [facility], [place] [address] [place], [place] [address]      Studies: Ct Head Wo Contrast  Result Date: 06/24/2019 CLINICAL DATA:  78 year old female status post total knee replacement yesterday with increasing altered mental status. EXAM: CT HEAD WITHOUT CONTRAST TECHNIQUE: Contiguous axial images were obtained from the base of the skull through the vertex without intravenous contrast. COMPARISON:  Head CT 03/13/2019. Brain MRI 03/13/2019. FINDINGS: Brain: Patchy and confluent bilateral cerebral white matter hypodensity is chronic. Chronic lacunar infarct in the right thalamus on series 2, image 14. Stable gray-white matter differentiation throughout the brain.  No midline shift, ventriculomegaly, mass effect, evidence of mass lesion, intracranial hemorrhage or evidence of cortically based acute infarction. Vascular: Calcified atherosclerosis at the skull base. No suspicious intracranial vascular hyperdensity. Skull: No acute osseous abnormality identified. Sinuses/Orbits: Visualized paranasal sinuses and mastoids are stable and well pneumatized. Other: No acute orbit or scalp soft tissue finding. IMPRESSION: 1. No acute intracranial abnormality. 2. Stable non contrast CT appearance of advanced chronic small vessel disease. Electronically Signed   By: Genevie Ann M.D.   On: 06/24/2019 22:54   Mr Brain Wo Contrast  Result Date: 06/25/2019 CLINICAL DATA:  Encephalopathy, 78 year old female status post total knee replacement yesterday with increasing altered mental status. EXAM: MRI HEAD WITHOUT CONTRAST TECHNIQUE: Multiplanar, multiecho pulse sequences of the brain and surrounding structures were obtained without intravenous contrast. COMPARISON:  Head CT 06/24/2019, brain MRI 03/13/2019 FINDINGS: Brain: Multiple sequences are motion degraded. No evidence of acute infarct. No evidence of intracranial mass. No midline shift or extra-axial fluid collection. Moderate generalized parenchymal atrophy. Advanced scattered and confluent T2/FLAIR hyperintensity within the cerebral white matter is nonspecific, but consistent with chronic small vessel ischemic disease. Multiple chronic lacunar infarcts again demonstrated, notably within the right centrum semiovale and right thalamus. Redemonstrated tiny foci  of chronic microhemorrhage within the brainstem. Vascular: Flow voids maintained within the proximal large arterial vessels. Skull and upper cervical spine: Normal marrow signal. Incompletely assessed cervical spondylosis. Pannus surrounds the odontoid with mild narrowing of the cervicomedullary junction. C2-C3 and C3-C4 grade 1 anterolisthesis. Sinuses/Orbits: Imaged globes and  orbits demonstrate no acute abnormality. Trace ethmoid sinus mucosal thickening. No significant mastoid effusion. IMPRESSION: Motion degraded examination. No evidence of acute intracranial abnormality, including acute infarct. Generalized parenchymal atrophy with advanced chronic small vessel ischemic disease. Multiple redemonstrated chronic lacunar infarcts. Additional chronic findings without interval change as described. Electronically Signed   By: Kellie Simmering   On: 06/25/2019 17:06   Dg Chest Port 1 View  Result Date: 06/24/2019 CLINICAL DATA:  Confusion, fever EXAM: PORTABLE CHEST 1 VIEW COMPARISON:  11/06/2017, 08/23/2018 FINDINGS: No consolidation or effusion. Mild cardiomegaly with aortic atherosclerosis. No pneumothorax. Calcified bilateral breast implants. IMPRESSION: No active disease.  Mild cardiomegaly Electronically Signed   By: Donavan Foil M.D.   On: 06/24/2019 22:26    Scheduled Meds:  amLODipine  5 mg Oral QHS   docusate sodium  100 mg Oral BID   escitalopram  10 mg Oral QHS   metoprolol succinate  100 mg Oral Daily   pantoprazole  40 mg Oral q morning - 10a   pantoprazole  80 mg Oral QPM   potassium chloride  10 mEq Oral Q supper   pravastatin  20 mg Oral QHS   rivaroxaban  10 mg Oral Q breakfast   Continuous Infusions:  sodium chloride Stopped (06/25/19 1533)   methocarbamol (ROBAXIN) IV      Active Problems:   Hyponatremia   Hypokalemia   HTN (hypertension)   Acute metabolic encephalopathy   OA (osteoarthritis) of knee   Mild cognitive impairment   Depression      Desiree Hane  [facility]

## 2019-06-26 NOTE — TOC Transition Note (Signed)
Transition of Care [facility]) - CM/SW Discharge Note   Patient Details  Name: Claire West MRN: [id number] Date of Birth: 01-07-1941  [facility] CM/SW Contact:  Leeroy Cha, RN Phone Number: 06/26/2019, 9:50 AM   Clinical Narrative:    dcd to home with self care and will do oopt at [facility] in [place]   Final next level of care: OP Rehab Barriers to Discharge: No Barriers Identified   Patient Goals and CMS Choice Patient states their goals for this hospitalization and ongoing recovery are:: to go home      Discharge Placement                       Discharge Plan and Services   Discharge Planning Services: CM Consult            DME Arranged: N/A DME Agency: NA       HH Arranged: NA HH Agency: NA        Social Determinants of Health (SDOH) Interventions     Readmission Risk Interventions No flowsheet data found.

## 2019-06-29 NOTE — Discharge Summary (Signed)
Physician Discharge Summary   Patient ID: Claire West MRN: [id number] DOB/AGE: 1941/03/15 78 y.o.  Admit date: 06/22/2019 Discharge date: 06/26/2019  Primary Diagnosis: Primary osteoarthritis right knee  Admission Diagnoses:  Past Medical History:  Diagnosis Date   Arthritis    Depression    Gait instability    walks with cane   GERD (gastroesophageal reflux disease)    History of blood transfusion    Hyperlipemia    Hypertension    Knee pain    Metabolic encephalopathy    Mild cognitive impairment with memory loss    Seasonal allergies    Skin cancer    Discharge Diagnoses:   Active Problems:   Hyponatremia   Hypokalemia   HTN (hypertension)   Acute metabolic encephalopathy   OA (osteoarthritis) of knee   Mild cognitive impairment   Depression  Estimated body mass index is 23.41 kg/m as calculated from the following:   Height as of this encounter: 5\' 6"  (1.676 m).   Weight as of this encounter: 65.8 kg.  Procedure:  Procedure(s) (LRB): TOTAL KNEE ARTHROPLASTY (Right)   Consults: None  HPI: Claire West, 78 y.o. female, has a history of pain and functional disability in the right knee due to arthritis and has failed non-surgical conservative treatments for greater than 12 weeks to includeviscosupplementation injections, use of assistive devices and activity modification.  Onset of symptoms was gradual, starting several years ago with gradually worsening course since that time. The patient noted no past surgery on the right knee(s).  Patient currently rates pain in the right knee(s) at 8 out of 10 with activity. Patient has worsening of pain with activity and weight bearing, pain that interferes with activities of daily living and crepitus.  Patient has evidence of tri-compartmental bone-on-bone changes with large osteophytes by imaging studies. There is no active infection.  Laboratory Data: Admission on 06/22/2019, Discharged on 06/26/2019    Component Date Value Ref Range Status   ABO/RH(D) 06/22/2019 O NEG   Final   Antibody Screen 06/22/2019 NEG   Final   Sample Expiration 06/22/2019    Final                   Value:06/25/2019,2359 Performed at [facility], [place] [address] [place], [place] [address]    WBC 06/23/2019 11.1* 4.0 - 10.5 K/uL Final   RBC 06/23/2019 3.41* 3.87 - 5.11 MIL/uL Final   Hemoglobin 06/23/2019 10.9* 12.0 - 15.0 g/dL Final   HCT 06/23/2019 31.8* 36.0 - 46.0 % Final   MCV 06/23/2019 93.3  80.0 - 100.0 fL Final   MCH 06/23/2019 32.0  26.0 - 34.0 pg Final   MCHC 06/23/2019 34.3  30.0 - 36.0 g/dL Final   RDW 06/23/2019 11.9  11.5 - 15.5 % Final   Platelets 06/23/2019 192  150 - 400 K/uL Final   nRBC 06/23/2019 0.0  0.0 - 0.2 % Final   Performed at [facility], [place] [address], [place], [place] [address]   Sodium 06/23/2019 135  135 - 145 mmol/L Final   Potassium 06/23/2019 4.4  3.5 - 5.1 mmol/L Final   Chloride 06/23/2019 101  98 - 111 mmol/L Final   CO2 06/23/2019 25  22 - 32 mmol/L Final   Glucose, Bld 06/23/2019 147* 70 - 99 mg/dL Final   BUN 06/23/2019 12  8 - 23 mg/dL Final   Creatinine, Ser 06/23/2019 0.79  0.44 - 1.00 mg/dL Final   Calcium 06/23/2019 9.0  8.9 - 10.3 mg/dL Final   GFR calc non Af Amer 06/23/2019 >60  >60 mL/min Final   GFR calc Af Amer 06/23/2019 >60  >60 mL/min Final   Anion gap 06/23/2019 9  5 - 15 Final   Performed at [facility], [place] [address] [place], [place] [address]   WBC 06/24/2019 13.0* 4.0 - 10.5 K/uL Final   RBC 06/24/2019 2.88* 3.87 - 5.11 MIL/uL Final   Hemoglobin 06/24/2019 9.1* 12.0 - 15.0 g/dL Final   HCT 06/24/2019 27.8* 36.0 - 46.0 % Final   MCV 06/24/2019 96.5  80.0 - 100.0 fL Final   MCH 06/24/2019 31.6  26.0 - 34.0 pg Final   MCHC 06/24/2019 32.7  30.0 - 36.0 g/dL Final   RDW 06/24/2019 12.3  11.5 - 15.5 % Final   Platelets 06/24/2019 156  150 - 400 K/uL  Final   nRBC 06/24/2019 0.0  0.0 - 0.2 % Final   Performed at [facility], [place] [address], [place], [place] [address]   Sodium 06/24/2019 138  135 - 145 mmol/L Final   Potassium 06/24/2019 4.0  3.5 - 5.1 mmol/L Final   Chloride 06/24/2019 106  98 - 111 mmol/L Final   CO2 06/24/2019 27  22 - 32 mmol/L Final   Glucose, Bld 06/24/2019 105* 70 - 99 mg/dL Final   BUN 06/24/2019 20  8 - 23 mg/dL Final   Creatinine, Ser 06/24/2019 0.88  0.44 - 1.00 mg/dL Final   Calcium 06/24/2019 8.6* 8.9 - 10.3 mg/dL Final   GFR calc non Af Amer 06/24/2019 >60  >60 mL/min Final   GFR calc Af Amer 06/24/2019 >60  >60 mL/min Final   Anion gap 06/24/2019 5  5 - 15 Final   Performed at [facility], [place] [address] [place], [place] [address]   WBC 06/25/2019 11.4* 4.0 - 10.5 K/uL Final   RBC 06/25/2019 2.81* 3.87 - 5.11 MIL/uL Final   Hemoglobin 06/25/2019 9.1* 12.0 - 15.0 g/dL Final   HCT 06/25/2019 27.0* 36.0 - 46.0 % Final   MCV 06/25/2019 96.1  80.0 - 100.0 fL Final   MCH 06/25/2019 32.4  26.0 - 34.0 pg Final   MCHC 06/25/2019 33.7  30.0 - 36.0 g/dL Final   RDW 06/25/2019 12.4  11.5 - 15.5 % Final   Platelets 06/25/2019 139* 150 - 400 K/uL Final   nRBC 06/25/2019 0.0  0.0 - 0.2 % Final   Performed at [facility], [place] [address], [place], [place] [address]   Glucose-Capillary 06/24/2019 130* 70 - 99 mg/dL Final   WBC 06/24/2019 10.2  4.0 - 10.5 K/uL Final   RBC 06/24/2019 2.59* 3.87 - 5.11 MIL/uL Final   Hemoglobin 06/24/2019 8.4* 12.0 - 15.0 g/dL Final   HCT 06/24/2019 24.4* 36.0 - 46.0 % Final   MCV 06/24/2019 94.2  80.0 - 100.0 fL Final   MCH 06/24/2019 32.4  26.0 - 34.0 pg Final   MCHC 06/24/2019 34.4  30.0 - 36.0 g/dL Final   RDW 06/24/2019 12.4  11.5 - 15.5 % Final   Platelets 06/24/2019 129* 150 - 400 K/uL Final   nRBC 06/24/2019 0.0  0.0 - 0.2 % Final   Neutrophils Relative % 06/24/2019 69  % Final    Neutro Abs 06/24/2019 7.0  1.7 - 7.7 K/uL Final   Lymphocytes Relative 06/24/2019 13  % Final   Lymphs Abs 06/24/2019 1.4  0.7 - 4.0 K/uL Final   Monocytes  Relative 06/24/2019 16  % Final   Monocytes Absolute 06/24/2019 1.7* 0.1 - 1.0 K/uL Final   Eosinophils Relative 06/24/2019 1  % Final   Eosinophils Absolute 06/24/2019 0.1  0.0 - 0.5 K/uL Final   Basophils Relative 06/24/2019 0  % Final   Basophils Absolute 06/24/2019 0.0  0.0 - 0.1 K/uL Final   Immature Granulocytes 06/24/2019 1  % Final   Abs Immature Granulocytes 06/24/2019 0.07  0.00 - 0.07 K/uL Final   Performed at [facility], [place] [address] [place], [place] [address]   Sodium 06/24/2019 134* 135 - 145 mmol/L Final   Potassium 06/24/2019 3.3* 3.5 - 5.1 mmol/L Final   DELTA CHECK NOTED   Chloride 06/24/2019 101  98 - 111 mmol/L Final   CO2 06/24/2019 23  22 - 32 mmol/L Final   Glucose, Bld 06/24/2019 136* 70 - 99 mg/dL Final   BUN 06/24/2019 14  8 - 23 mg/dL Final   Creatinine, Ser 06/24/2019 0.63  0.44 - 1.00 mg/dL Final   Calcium 06/24/2019 8.0* 8.9 - 10.3 mg/dL Final   Total Protein 06/24/2019 5.4* 6.5 - 8.1 g/dL Final   Albumin 06/24/2019 2.9* 3.5 - 5.0 g/dL Final   AST 06/24/2019 20  15 - 41 U/L Final   ALT 06/24/2019 14  0 - 44 U/L Final   Alkaline Phosphatase 06/24/2019 55  38 - 126 U/L Final   Total Bilirubin 06/24/2019 0.5  0.3 - 1.2 mg/dL Final   GFR calc non Af Amer 06/24/2019 >60  >60 mL/min Final   GFR calc Af Amer 06/24/2019 >60  >60 mL/min Final   Anion gap 06/24/2019 10  5 - 15 Final   Performed at [facility], [place] [address], [place], [place] [address]   O2 Content 06/24/2019 2.0  L/min Final   Delivery systems 06/24/2019 NASAL CANNULA   Final   LHR 06/24/2019 18  resp/min Final   pH, Ven 06/24/2019 7.483* 7.250 - 7.430 Final   pCO2, Ven 06/24/2019 36.2* 44.0 - 60.0 mmHg Final   pO2, Ven 06/24/2019 67.5* 32.0 - 45.0 mmHg Final    Bicarbonate 06/24/2019 26.9  20.0 - 28.0 mmol/L Final   Acid-Base Excess 06/24/2019 3.7* 0.0 - 2.0 mmol/L Final   O2 Saturation 06/24/2019 94.7  % Final   Patient temperature 06/24/2019 98.6   Final   Collection site 06/24/2019 VEIN   Final   Drawn by 06/24/2019 DRAWN BY RN   Final   Sample type 06/24/2019 VENOUS   Final   Performed at [facility], [place] [address], [place], [place] [address]   Color, Urine 06/24/2019 COLORLESS* YELLOW Final   APPearance 06/24/2019 CLEAR  CLEAR Final   Specific Gravity, Urine 06/24/2019 1.005  1.005 - 1.030 Final   pH 06/24/2019 7.0  5.0 - 8.0 Final   Glucose, UA 06/24/2019 NEGATIVE  NEGATIVE mg/dL Final   Hgb urine dipstick 06/24/2019 NEGATIVE  NEGATIVE Final   Bilirubin Urine 06/24/2019 NEGATIVE  NEGATIVE Final   Ketones, ur 06/24/2019 NEGATIVE  NEGATIVE mg/dL Final   Protein, ur 06/24/2019 NEGATIVE  NEGATIVE mg/dL Final   Nitrite 06/24/2019 NEGATIVE  NEGATIVE Final   Leukocytes,Ua 06/24/2019 NEGATIVE  NEGATIVE Final   Performed at [place] [address] [place], [place] [address]   Troponin I (High Sensitivity) 06/25/2019 5  <18 ng/L Final   Comment: (NOTE) Elevated high sensitivity troponin I (hsTnI) values and significant  changes across serial measurements may suggest ACS  but many other  chronic and acute conditions are known to elevate hsTnI results.  Refer to the "Links" section for chest pain algorithms and additional  guidance. Performed at [facility], [place] [address] [place], [place] [address]    Sodium 06/25/2019 137  135 - 145 mmol/L Final   Potassium 06/25/2019 4.1  3.5 - 5.1 mmol/L Final   DELTA CHECK NOTED   Chloride 06/25/2019 102  98 - 111 mmol/L Final   CO2 06/25/2019 24  22 - 32 mmol/L Final   Glucose, Bld 06/25/2019 104* 70 - 99 mg/dL Final   BUN 06/25/2019 11  8 - 23 mg/dL Final   Creatinine, Ser 06/25/2019 0.62  0.44 - 1.00 mg/dL Final    Calcium 06/25/2019 8.5* 8.9 - 10.3 mg/dL Final   GFR calc non Af Amer 06/25/2019 >60  >60 mL/min Final   GFR calc Af Amer 06/25/2019 >60  >60 mL/min Final   Anion gap 06/25/2019 11  5 - 15 Final   Performed at [facility], [place] [address] [place], [place] [address]  Hospital Outpatient Visit on 06/18/2019  Component Date Value Ref Range Status   SARS Coronavirus 2 06/18/2019 NEGATIVE  NEGATIVE Final   Comment: (NOTE) SARS-CoV-2 target nucleic acids are NOT DETECTED. The SARS-CoV-2 RNA is generally detectable in upper and lower respiratory specimens during the acute phase of infection. Negative results do not preclude SARS-CoV-2 infection, do not rule out co-infections with other pathogens, and should not be used as the sole basis for treatment or other patient management decisions. Negative results must be combined with clinical observations, patient history, and epidemiological information. The expected result is Negative. Fact Sheet for Patients: SugarRoll.be Fact Sheet for Healthcare Providers: https://www.woods-mathews.com/ This test is not yet approved or cleared by the [place] FDA and  has been authorized for detection and/or diagnosis of SARS-CoV-2 by FDA under an Emergency Use Authorization (EUA). This EUA will remain  in effect (meaning this test can be used) for the duration of the COVID-19 declaration under Section 56                          4(b)(1) of the Act, 21 U.S.C. section 360bbb-3(b)(1), unless the authorization is terminated or revoked sooner. Performed at [facility], [place] [address], [place], [place] [address]   Hospital Outpatient Visit on 06/10/2019  Component Date Value Ref Range Status   aPTT 06/10/2019 30  24 - 36 seconds Final   Performed at [facility], [place] [address], [place], [place] [address]   WBC 06/10/2019 5.9  4.0 - 10.5 K/uL Final   RBC  06/10/2019 3.76* 3.87 - 5.11 MIL/uL Final   Hemoglobin 06/10/2019 12.2  12.0 - 15.0 g/dL Final   HCT 06/10/2019 35.6* 36.0 - 46.0 % Final   MCV 06/10/2019 94.7  80.0 - 100.0 fL Final   MCH 06/10/2019 32.4  26.0 - 34.0 pg Final   MCHC 06/10/2019 34.3  30.0 - 36.0 g/dL Final   RDW 06/10/2019 12.0  11.5 - 15.5 % Final   Platelets 06/10/2019 201  150 - 400 K/uL Final   nRBC 06/10/2019 0.0  0.0 - 0.2 % Final   Performed at [facility], [place] [address] [place], [place] [address]   Sodium 06/10/2019 137  135 - 145 mmol/L Final   Potassium 06/10/2019 4.0  3.5 - 5.1 mmol/L Final   Chloride 06/10/2019 101  98 - 111 mmol/L Final   CO2 06/10/2019 29  22 - 32 mmol/L Final   Glucose, Bld 06/10/2019 92  70 - 99 mg/dL Final   BUN 06/10/2019 5* 8 - 23 mg/dL Final   Creatinine, Ser 06/10/2019 0.58  0.44 - 1.00 mg/dL Final   Calcium 06/10/2019 9.3  8.9 - 10.3 mg/dL Final   Total Protein 06/10/2019 6.6  6.5 - 8.1 g/dL Final   Albumin 06/10/2019 3.9  3.5 - 5.0 g/dL Final   AST 06/10/2019 24  15 - 41 U/L Final   ALT 06/10/2019 15  0 - 44 U/L Final   Alkaline Phosphatase 06/10/2019 80  38 - 126 U/L Final   Total Bilirubin 06/10/2019 0.5  0.3 - 1.2 mg/dL Final   GFR calc non Af Amer 06/10/2019 >60  >60 mL/min Final   GFR calc Af Amer 06/10/2019 >60  >60 mL/min Final   Anion gap 06/10/2019 7  5 - 15 Final   Performed at [facility], [place] [address], [place], [place] [address]   Prothrombin Time 06/10/2019 13.2  11.4 - 15.2 seconds Final   INR 06/10/2019 1.0  0.8 - 1.2 Final   Comment: (NOTE) INR goal varies based on device and disease states. Performed at [facility], [place] [address], [place], [place] [address]    MRSA, PCR 06/10/2019 NEGATIVE  NEGATIVE Final   Staphylococcus aureus 06/10/2019 NEGATIVE  NEGATIVE Final   Comment: (NOTE) The Xpert SA Assay (FDA approved for NASAL specimens in patients 56 years of age  and older), is one component of a comprehensive surveillance program. It is not intended to diagnose infection nor to guide or monitor treatment. Performed at [facility], [place] [address], [place], [place] [address]      X-Rays:Ct Head Wo Contrast  Result Date: 06/24/2019 CLINICAL DATA:  78 year old female status post total knee replacement yesterday with increasing altered mental status. EXAM: CT HEAD WITHOUT CONTRAST TECHNIQUE: Contiguous axial images were obtained from the base of the skull through the vertex without intravenous contrast. COMPARISON:  Head CT 03/13/2019. Brain MRI 03/13/2019. FINDINGS: Brain: Patchy and confluent bilateral cerebral white matter hypodensity is chronic. Chronic lacunar infarct in the right thalamus on series 2, image 14. Stable gray-white matter differentiation throughout the brain. No midline shift, ventriculomegaly, mass effect, evidence of mass lesion, intracranial hemorrhage or evidence of cortically based acute infarction. Vascular: Calcified atherosclerosis at the skull base. No suspicious intracranial vascular hyperdensity. Skull: No acute osseous abnormality identified. Sinuses/Orbits: Visualized paranasal sinuses and mastoids are stable and well pneumatized. Other: No acute orbit or scalp soft tissue finding. IMPRESSION: 1. No acute intracranial abnormality. 2. Stable non contrast CT appearance of advanced chronic small vessel disease. Electronically Signed   By: Claire West M.D.   On: 06/24/2019 22:54   Mr Brain Wo Contrast  Result Date: 06/25/2019 CLINICAL DATA:  Encephalopathy, 78 year old female status post total knee replacement yesterday with increasing altered mental status. EXAM: MRI HEAD WITHOUT CONTRAST TECHNIQUE: Multiplanar, multiecho pulse sequences of the brain and surrounding structures were obtained without intravenous contrast. COMPARISON:  Head CT 06/24/2019, brain MRI 03/13/2019 FINDINGS: Brain: Multiple sequences are motion  degraded. No evidence of acute infarct. No evidence of intracranial mass. No midline shift or extra-axial fluid collection. Moderate generalized parenchymal atrophy. Advanced scattered and confluent T2/FLAIR hyperintensity within the cerebral white matter is nonspecific, but consistent with chronic small vessel ischemic disease. Multiple chronic lacunar infarcts again demonstrated, notably within the right  centrum semiovale and right thalamus. Redemonstrated tiny foci of chronic microhemorrhage within the brainstem. Vascular: Flow voids maintained within the proximal large arterial vessels. Skull and upper cervical spine: Normal marrow signal. Incompletely assessed cervical spondylosis. Pannus surrounds the odontoid with mild narrowing of the cervicomedullary junction. C2-C3 and C3-C4 grade 1 anterolisthesis. Sinuses/Orbits: Imaged globes and orbits demonstrate no acute abnormality. Trace ethmoid sinus mucosal thickening. No significant mastoid effusion. IMPRESSION: Motion degraded examination. No evidence of acute intracranial abnormality, including acute infarct. Generalized parenchymal atrophy with advanced chronic small vessel ischemic disease. Multiple redemonstrated chronic lacunar infarcts. Additional chronic findings without interval change as described. Electronically Signed   By: Claire West   On: 06/25/2019 17:06   Dg Bone Density (dxa)  Result Date: 06/12/2019 EXAM: DUAL X-RAY ABSORPTIOMETRY (DXA) FOR BONE MINERAL DENSITY IMPRESSION: Your patient Claire West completed a BMD test on 06/12/2019 using the [place] (software version: 14.10) manufactured by [employer]. The following summarizes the results of our evaluation. Technologist::TNB PATIENT BIOGRAPHICAL: Name: Claire, West Patient ID: [id number] Birth Date: 02/27/1941 Height: 66.0 in. Gender: Female Exam Date: 06/12/2019 Weight: 136.0 lbs. Indications: Bilateral Oophrectomy, Caucasian, Follow up Osteopenia,  Height Loss, History of Fracture (Adult), Low Calcium Intake, Post Menopausal Fractures: Sacrum, Wrist Treatments: Multivitamin, Vitamin D DENSITOMETRY RESULTS: Site         Region     Measured Date Measured Age WHO Classification Young Adult T-score BMD         %Change vs. Previous Significant Change (*) DualFemur Neck Left 06/12/2019 77.4 Osteoporosis -3.5 0.554 g/cm2 -28.6% Yes DualFemur Neck Left 04/06/2008 66.2 Osteopenia -1.9 0.776 g/cm2 2.6% - DualFemur Neck Left 11/11/2006 64.8 Osteopenia -2.0 0.756 g/cm2 - - DualFemur Total Mean 06/12/2019 77.4 Osteoporosis -3.2 0.599 g/cm2 -28.0% Yes DualFemur Total Mean 04/06/2008 66.2 Osteopenia -1.4 0.832 g/cm2 -2.5% - DualFemur Total Mean 11/11/2006 64.8 Osteopenia -1.2 0.853 g/cm2 - - Left Forearm Radius 33% 06/12/2019 77.4 Osteoporosis -3.1 0.495 g/cm2 - - ASSESSMENT: The BMD measured at Femur Neck Left is 0.554 g/cm2 with a T-score of -3.5. This patient is considered osteoporotic according to [facility]) criteria. The scan quality is good. Compared with the prior study on 04/06/2008, the BMD of the left femoral neck and total mean shows a statistically significant decrease. Lumbar spine was excluded due to advanced degenerative changes. World Pharmacologist [facility]) criteria for post-menopausal, Caucasian Women: Normal:       T-score at or above -1 SD Osteopenia:   T-score between -1 and -2.5 SD Osteoporosis: T-score at or below -2.5 SD RECOMMENDATIONS: 1. All patients should optimize calcium and vitamin D intake. 2. Consider FDA-approved medical therapies in postmenopausal women and med aged 30 years and older, based on the following: a. A hip or vertebral (clinical or morphometric) fracture b. T-score< -2.5 at the femoral neck or spine after appropriate evaluation to exclude secondary causes c. Low bone mass (T-score between -1.0 and -2.5 at the femoral neck or spine) and a 10-year probability of a hip fracture > 3% or a 10-year probability of  a major osteoporosis-related fracture > 20% based on the [place]-adapted WHO algorithm d. Clinician judgment and/or patient preferences may indicate treatment for people with 10-year fracture probabilities above or below these levels FOLLOW-UP: People with diagnosed cases of osteoporosis or at high risk for fracture should have regular bone mineral density tests. For patients eligible for Medicare, routine testing is allowed once every 2 years. The testing frequency can be increased  to one year for patients who have rapidly progressing disease, those who are receiving or discontinuing medical therapy to restore bone mass, or have additional risk factors. Electronically Signed   By: Marlaine Hind M.D.   On: 06/12/2019 14:25   Dg Chest Port 1 View  Result Date: 06/24/2019 CLINICAL DATA:  Confusion, fever EXAM: PORTABLE CHEST 1 VIEW COMPARISON:  11/06/2017, 08/23/2018 FINDINGS: No consolidation or effusion. Mild cardiomegaly with aortic atherosclerosis. No pneumothorax. Calcified bilateral breast implants. IMPRESSION: No active disease.  Mild cardiomegaly Electronically Signed   By: Donavan Foil M.D.   On: 06/24/2019 22:26   Mm 3d Screen Breast W/implant Bilateral  Result Date: 06/16/2019 CLINICAL DATA:  Screening. EXAM: DIGITAL SCREENING BILATERAL MAMMOGRAM WITH IMPLANTS, CAD AND TOMO The patient has retroglandular silicone implants. Standard and implant displaced views were performed. COMPARISON:  Previous exam(s). ACR Breast Density Category b: There are scattered areas of fibroglandular density. FINDINGS: There are no findings suspicious for malignancy. There is evidence of extracapsular rupture of both implants, mildly progressed compared to the most recent prior study. Images were processed with CAD. IMPRESSION: No mammographic evidence of malignancy. A result letter of this screening mammogram will be mailed directly to the patient. Extracapsular implant rupture bilaterally. RECOMMENDATION: Screening mammogram  in one year. (Code:[id number]) BI-RADS CATEGORY  1:  Negative. Electronically Signed   By: Lajean Manes M.D.   On: 06/16/2019 10:32    EKG: Orders placed or performed during the hospital encounter of 06/22/19   EKG 12-Lead   EKG 12-Lead   EKG 12-Lead   EKG 12-Lead     Hospital Course: Claire West is a 78 y.o. who was admitted to [facility]. They were brought to the operating room on 06/22/2019 and underwent Procedure(s): TOTAL KNEE ARTHROPLASTY.  Patient tolerated the procedure well and was later transferred to the recovery room and then to the orthopaedic floor for postoperative care.  They were given PO and IV analgesics for pain control following their surgery.  They were given 24 hours of postoperative antibiotics of  Anti-infectives (From admission, onward)   Start     Dose/Rate Route Frequency Ordered Stop   06/22/19 2300  vancomycin (VANCOCIN) IVPB 1000 mg/200 mL premix     1,000 mg 200 mL/hr over 60 Minutes Intravenous Every 12 hours 06/22/19 1409 06/23/19 0001   06/22/19 0930  vancomycin (VANCOCIN) IVPB 1000 mg/200 mL premix     1,000 mg 200 mL/hr over 60 Minutes Intravenous  Once 06/22/19 0922 06/22/19 1137   06/22/19 0900  ceFAZolin (ANCEF) IVPB 2g/100 mL premix  Status:  Discontinued     2 g 200 mL/hr over 30 Minutes Intravenous On call to O.R. 06/22/19 [id number] 06/22/19 0934     and started on DVT prophylaxis in the form of Aspirin.   PT and OT were ordered for total joint protocol.  Discharge planning consulted to help with postop disposition and equipment needs.  Patient had a fair night on the evening of surgery.  They started to get up OOB with therapy on day one. Hemovac drain was pulled without difficulty.  Continued to work with therapy into day two but developed issues with post op delirium. Pain medications were held.  Dressing was changed on day two and the incision was clean and dry. Medical consult obtained and no acute findings were noted on head CT or  brain MRI. By day four, the patient had progressed with therapy and meeting their goals. Post op delirium resolved and  patient back to baseline. Incision was healing well.  Patient was seen in rounds and was ready to go home.   Diet: Cardiac diet Activity:WBAT Follow-up:in 2 weeks Disposition - Home Discharged Condition: stable   Discharge Instructions    Call MD / Call 911   Complete by: As directed    If you experience chest pain or shortness of breath, CALL 911 and be transported to the hospital emergency room.  If you develope a fever above 101 F, pus (white drainage) or increased drainage or redness at the wound, or calf pain, call your surgeon's office.   Change dressing   Complete by: As directed    Change dressing on Thursday, then change the dressing daily with sterile 4 x 4 inch gauze dressing and apply TED hose.   Constipation Prevention   Complete by: As directed    Drink plenty of fluids.  Prune juice may be helpful.  You may use a stool softener, such as Colace (over the counter) 100 mg twice a day.  Use MiraLax (over the counter) for constipation as needed.   Diet - low sodium heart healthy   Complete by: As directed    Discharge instructions   Complete by: As directed    Dr. Gaynelle Arabian Total Joint Specialist [facility] [address] [place], [place] [address] [phone]  TOTAL KNEE REPLACEMENT POSTOPERATIVE DIRECTIONS  Knee Rehabilitation, Guidelines Following Surgery  Results after knee surgery are often greatly improved when you follow the exercise, range of motion and muscle strengthening exercises prescribed by your doctor. Safety measures are also important to protect the knee from further injury. Any time any of these exercises cause you to have increased pain or swelling in your knee joint, decrease the amount until you are comfortable again and slowly increase them. If you have problems or questions, call your caregiver or physical  therapist for advice.   HOME CARE INSTRUCTIONS  Remove items at home which could result in a fall. This includes throw rugs or furniture in walking pathways.  ICE to the affected knee every three hours for 30 minutes at a time and then as needed for pain and swelling.  Continue to use ice on the knee for pain and swelling from surgery. You may notice swelling that will progress down to the foot and ankle.  This is normal after surgery.  Elevate the leg when you are not up walking on it.   Continue to use the breathing machine which will help keep your temperature down.  It is common for your temperature to cycle up and down following surgery, especially at night when you are not up moving around and exerting yourself.  The breathing machine keeps your lungs expanded and your temperature down. Do not place pillow under knee, focus on keeping the knee straight while resting   DIET You may resume your previous home diet once your are discharged from the hospital.  DRESSING / WOUND CARE / SHOWERING You may change your dressing 3-5 days after surgery.  Then change the dressing every day with sterile gauze.  Please use good hand washing techniques before changing the dressing.  Do not use any lotions or creams on the incision until instructed by your surgeon. You may start showering once you are discharged home but do not submerge the incision under water. Just pat the incision dry and apply a dry gauze dressing on daily. Change the surgical dressing daily and reapply a dry dressing each time.  ACTIVITY Walk with your walker as instructed. Use walker as long as suggested by your caregivers. Avoid periods of inactivity such as sitting longer than an hour when not asleep. This helps prevent blood clots.  You may resume a sexual relationship in one month or when given the OK by your doctor.  You may return to work once you are cleared by your doctor.  Do not drive a car for 6 weeks or until released by  you surgeon.  Do not drive while taking narcotics.  WEIGHT BEARING Weight bearing as tolerated with assist device (walker, cane, etc) as directed, use it as long as suggested by your surgeon or therapist, typically at least 4-6 weeks.  POSTOPERATIVE CONSTIPATION PROTOCOL Constipation - defined medically as fewer than three stools per week and severe constipation as less than one stool per week.  One of the most common issues patients have following surgery is constipation.  Even if you have a regular bowel pattern at home, your normal regimen is likely to be disrupted due to multiple reasons following surgery.  Combination of anesthesia, postoperative narcotics, change in appetite and fluid intake all can affect your bowels.  In order to avoid complications following surgery, here are some recommendations in order to help you during your recovery period.  Colace (docusate) - Pick up an over-the-counter form of Colace or another stool softener and take twice a day as long as you are requiring postoperative pain medications.  Take with a full glass of water daily.  If you experience loose stools or diarrhea, hold the colace until you stool forms back up.  If your symptoms do not get better within 1 week or if they get worse, check with your doctor.  Dulcolax (bisacodyl) - Pick up over-the-counter and take as directed by the product packaging as needed to assist with the movement of your bowels.  Take with a full glass of water.  Use this product as needed if not relieved by Colace only.   MiraLax (polyethylene glycol) - Pick up over-the-counter to have on hand.  MiraLax is a solution that will increase the amount of water in your bowels to assist with bowel movements.  Take as directed and can mix with a glass of water, juice, soda, coffee, or tea.  Take if you go more than two days without a movement. Do not use MiraLax more than once per day. Call your doctor if you are still constipated or irregular  after using this medication for 7 days in a row.  If you continue to have problems with postoperative constipation, please contact the office for further assistance and recommendations.  If you experience "the worst abdominal pain ever" or develop nausea or vomiting, please contact the office immediatly for further recommendations for treatment.  ITCHING  If you experience itching with your medications, try taking only a single pain pill, or even half a pain pill at a time.  You can also use Benadryl over the counter for itching or also to help with sleep.   TED HOSE STOCKINGS Wear the elastic stockings on both legs for three weeks following surgery during the day but you may remove then at night for sleeping.  MEDICATIONS See your medication summary on the "After Visit Summary" that the nursing staff will review with you prior to discharge.  You may have some home medications which will be placed on hold until you complete the course of blood thinner medication.  It is important for you to complete  the blood thinner medication as prescribed by your surgeon.  Continue your approved medications as instructed at time of discharge.  PRECAUTIONS If you experience chest pain or shortness of breath - call 911 immediately for transfer to the hospital emergency department.  If you develop a fever greater that 101 F, purulent drainage from wound, increased redness or drainage from wound, foul odor from the wound/dressing, or calf pain - CONTACT YOUR SURGEON.                                                   FOLLOW-UP APPOINTMENTS Make sure you keep all of your appointments after your operation with your surgeon and caregivers. You should call the office at the above phone number and make an appointment for approximately two weeks after the date of your surgery or on the date instructed by your surgeon outlined in the "After Visit Summary".   RANGE OF MOTION AND STRENGTHENING EXERCISES  Rehabilitation of  the knee is important following a knee injury or an operation. After just a few days of immobilization, the muscles of the thigh which control the knee become weakened and shrink (atrophy). Knee exercises are designed to build up the tone and strength of the thigh muscles and to improve knee motion. Often times heat used for twenty to thirty minutes before working out will loosen up your tissues and help with improving the range of motion but do not use heat for the first two weeks following surgery. These exercises can be done on a training (exercise) mat, on the floor, on a table or on a bed. Use what ever works the best and is most comfortable for you Knee exercises include:  Leg Lifts - While your knee is still immobilized in a splint or cast, you can do straight leg raises. Lift the leg to 60 degrees, hold for 3 sec, and slowly lower the leg. Repeat 10-20 times 2-3 times daily. Perform this exercise against resistance later as your knee gets better.  Quad and Hamstring Sets - Tighten up the muscle on the front of the thigh (Quad) and hold for 5-10 sec. Repeat this 10-20 times hourly. Hamstring sets are done by pushing the foot backward against an object and holding for 5-10 sec. Repeat as with quad sets.  Leg Slides: Lying on your back, slowly slide your foot toward your buttocks, bending your knee up off the floor (only go as far as is comfortable). Then slowly slide your foot back down until your leg is flat on the floor again. Angel Wings: Lying on your back spread your legs to the side as far apart as you can without causing discomfort.  A rehabilitation program following serious knee injuries can speed recovery and prevent re-injury in the future due to weakened muscles. Contact your doctor or a physical therapist for more information on knee rehabilitation.   IF YOU ARE TRANSFERRED TO A SKILLED REHAB FACILITY If the patient is transferred to a skilled rehab facility following release from the  hospital, a list of the current medications will be sent to the facility for the patient to continue.  When discharged from the skilled rehab facility, please have the facility set up the patient's [place] prior to being released. Also, the skilled facility will be responsible for providing the patient with their medications at  time of release from the facility to include their pain medication, the muscle relaxants, and their blood thinner medication. If the patient is still at the rehab facility at time of the two week follow up appointment, the skilled rehab facility will also need to assist the patient in arranging follow up appointment in our office and any transportation needs.  MAKE SURE YOU:  Understand these instructions.  Get help right away if you are not doing well or get worse.    Pick up stool softner and laxative for home use following surgery while on pain medications. Do not submerge incision under water. Please use good hand washing techniques while changing dressing each day. May shower starting three days after surgery. Please use a clean towel to pat the incision dry following showers. Continue to use ice for pain and swelling after surgery. Do not use any lotions or creams on the incision until instructed by your surgeon.   Do not put a pillow under the knee. Place it under the heel.   Complete by: As directed    Driving restrictions   Complete by: As directed    No driving for two weeks   TED hose   Complete by: As directed    Use stockings (TED hose) for three weeks on both leg(s).  You may remove them at night for sleeping.   Weight bearing as tolerated   Complete by: As directed      Allergies as of 06/26/2019      Reactions   Penicillins Anaphylaxis, Swelling, Rash   Oral rash/peeling Did it involve swelling of the face/tongue/throat, SOB, or low BP? Yes Did it involve sudden or severe rash/hives, skin peeling, or any reaction on the inside  of your mouth or nose? Yes Did you need to seek medical attention at a hospital or doctor's office? Yes When did it last happen? Approximately 2017 If all above answers are "NO", may proceed with cephalosporin use. **Has tolerated Rocephin, Vantin since 2017**   Adhesive [tape] Itching, Rash   Irritation at site   Codeine Hives, Rash   Neosporin [neomycin-bacitracin Zn-polymyx] Rash   Sulfa Antibiotics Hives, Rash      Medication List    STOP taking these medications   aspirin EC 81 MG tablet   cholecalciferol 25 MCG (1000 UT) tablet Commonly known as: VITAMIN D3   Krill Oil 500 MG Caps   Melatonin 5 MG Tabs   metoprolol succinate 100 MG 24 hr tablet Commonly known as: TOPROL-XL   multivitamin with minerals Tabs tablet   PROBIOTIC PO   TURMERIC PO     TAKE these medications   acetaminophen 500 MG tablet Commonly known as: TYLENOL Take 500 mg by mouth at bedtime.   amLODipine 5 MG tablet Commonly known as: NORVASC Take 1 tablet (5 mg total) by mouth at bedtime. What changed: how much to take   escitalopram 10 MG tablet Commonly known as: LEXAPRO Take 10 mg by mouth at bedtime.   ferrous sulfate 325 (65 FE) MG tablet Take 1 tablet (325 mg total) by mouth 3 (three) times daily with meals for 19 days. Take for two weeks as tolerated.   fexofenadine 180 MG tablet Commonly known as: ALLEGRA Take 180 mg by mouth daily as needed for allergies.   hydrocortisone cream 1 % Apply 1 application topically 2 (two) times daily as needed for itching.   methocarbamol 500 MG tablet Commonly known as: ROBAXIN Take 1 tablet (500 mg total) by mouth  every 6 (six) hours as needed for muscle spasms.   omeprazole 20 MG capsule Commonly known as: PRILOSEC Take 20-40 mg by mouth See admin instructions. Take 40mg  in the AM and 20mg  in the PM.   potassium chloride 10 MEQ tablet Commonly known as: K-DUR Take 1 tablet (10 mEq total) by mouth daily. What changed: when to  take this   pravastatin 20 MG tablet Commonly known as: PRAVACHOL Take 20 mg by mouth at bedtime.   rivaroxaban 10 MG Tabs tablet Commonly known as: XARELTO Take 1 tablet (10 mg total) by mouth daily with breakfast for 19 days. Take one tablet Xarelto once a day for three weeks following surgery. Then resume one baby Aspirin (81 mg) once a day.   traMADol 50 MG tablet Commonly known as: ULTRAM Take 1-2 tablets (50-100 mg total) by mouth every 6 (six) hours as needed for moderate pain.   triamcinolone cream 0.1 % Commonly known as: KENALOG Apply 1 application topically 2 (two) times daily as needed (itching).            Discharge Care Instructions  (From admission, onward)         Start     Ordered   06/24/19 0000  Weight bearing as tolerated     06/24/19 0816   06/24/19 0000  Change dressing    Comments: Change dressing on Thursday, then change the dressing daily with sterile 4 x 4 inch gauze dressing and apply TED hose.   06/24/19 0816         Follow-up Information    Gaynelle Arabian, MD. Go on 07/07/2019.   Specialty: Orthopedic Surgery Why: You are scheduled for a postoperative appointment on 07-07-19 at 1:15 pm. Contact information: [address] [place] [address] [id number]        Therapy, [facility]. Go on 06/30/2019.   Specialty: Physical Therapy Why: You are scheduled for a physical therapy appointment on 06-30-2019 at 11:00 am. Contact information: [place] [address] [phone]           Signed: Ardeen Jourdain, PA-C Orthopaedic Surgery 06/29/2019, 7:37 AM

## 2019-06-30 DIAGNOSIS — Z471 Aftercare following joint replacement surgery: Secondary | ICD-10-CM | POA: Diagnosis not present

## 2019-06-30 DIAGNOSIS — R2689 Other abnormalities of gait and mobility: Secondary | ICD-10-CM | POA: Diagnosis not present

## 2019-06-30 DIAGNOSIS — Z96651 Presence of right artificial knee joint: Secondary | ICD-10-CM | POA: Diagnosis not present

## 2019-07-01 ENCOUNTER — Encounter (HOSPITAL_COMMUNITY): Payer: Self-pay | Admitting: Emergency Medicine

## 2019-07-01 ENCOUNTER — Emergency Department (HOSPITAL_COMMUNITY)
Admission: EM | Admit: 2019-07-01 | Discharge: 2019-07-01 | Disposition: A | Discharge status: Home / Self Care | Payer: Medicare Other | Attending: Emergency Medicine | Admitting: Emergency Medicine

## 2019-07-01 ENCOUNTER — Emergency Department (HOSPITAL_COMMUNITY): Payer: Medicare Other

## 2019-07-01 ENCOUNTER — Other Ambulatory Visit: Payer: Self-pay

## 2019-07-01 DIAGNOSIS — R0789 Other chest pain: Secondary | ICD-10-CM | POA: Diagnosis not present

## 2019-07-01 DIAGNOSIS — I10 Essential (primary) hypertension: Secondary | ICD-10-CM | POA: Diagnosis not present

## 2019-07-01 DIAGNOSIS — R079 Chest pain, unspecified: Secondary | ICD-10-CM | POA: Diagnosis not present

## 2019-07-01 DIAGNOSIS — Z8673 Personal history of transient ischemic attack (TIA), and cerebral infarction without residual deficits: Secondary | ICD-10-CM | POA: Diagnosis not present

## 2019-07-01 DIAGNOSIS — Z96653 Presence of artificial knee joint, bilateral: Secondary | ICD-10-CM | POA: Insufficient documentation

## 2019-07-01 DIAGNOSIS — Z7901 Long term (current) use of anticoagulants: Secondary | ICD-10-CM | POA: Diagnosis not present

## 2019-07-01 DIAGNOSIS — I959 Hypotension, unspecified: Secondary | ICD-10-CM | POA: Diagnosis not present

## 2019-07-01 DIAGNOSIS — R000 Tachycardia, unspecified: Secondary | ICD-10-CM | POA: Diagnosis not present

## 2019-07-01 DIAGNOSIS — R0902 Hypoxemia: Secondary | ICD-10-CM | POA: Diagnosis not present

## 2019-07-01 DIAGNOSIS — Z79899 Other long term (current) drug therapy: Secondary | ICD-10-CM | POA: Insufficient documentation

## 2019-07-01 DIAGNOSIS — R42 Dizziness and giddiness: Secondary | ICD-10-CM | POA: Diagnosis not present

## 2019-07-01 DIAGNOSIS — Z20828 Contact with and (suspected) exposure to other viral communicable diseases: Secondary | ICD-10-CM | POA: Diagnosis not present

## 2019-07-01 DIAGNOSIS — R55 Syncope and collapse: Secondary | ICD-10-CM | POA: Insufficient documentation

## 2019-07-01 LAB — URINALYSIS, ROUTINE W REFLEX MICROSCOPIC
Bacteria, UA: NONE SEEN
Bilirubin Urine: NEGATIVE
Glucose, UA: NEGATIVE mg/dL
Ketones, ur: NEGATIVE mg/dL
Leukocytes,Ua: NEGATIVE
Nitrite: NEGATIVE
Protein, ur: NEGATIVE mg/dL
Specific Gravity, Urine: 1.003 — ABNORMAL LOW (ref 1.005–1.030)
pH: 8 (ref 5.0–8.0)

## 2019-07-01 LAB — BASIC METABOLIC PANEL
Anion gap: 8 (ref 5–15)
BUN: 10 mg/dL (ref 8–23)
CO2: 25 mmol/L (ref 22–32)
Calcium: 8.5 mg/dL — ABNORMAL LOW (ref 8.9–10.3)
Chloride: 102 mmol/L (ref 98–111)
Creatinine, Ser: 0.64 mg/dL (ref 0.44–1.00)
GFR calc Af Amer: >60 mL/min (ref >60)
GFR calc non Af Amer: >60 mL/min (ref >60)
Glucose, Bld: 115 mg/dL — ABNORMAL HIGH (ref 70–99)
Potassium: 3.3 mmol/L — ABNORMAL LOW (ref 3.5–5.1)
Sodium: 135 mmol/L (ref 135–145)

## 2019-07-01 LAB — CBC
HCT: 26.1 % — ABNORMAL LOW (ref 36.0–46.0)
Hemoglobin: 8.3 g/dL — ABNORMAL LOW (ref 12.0–15.0)
MCH: 31.4 pg (ref 26.0–34.0)
MCHC: 31.8 g/dL (ref 30.0–36.0)
MCV: 98.9 fL (ref 80.0–100.0)
Platelets: 341 10*3/uL (ref 150–400)
RBC: 2.64 MIL/uL — ABNORMAL LOW (ref 3.87–5.11)
RDW: 13.4 % (ref 11.5–15.5)
WBC: 11.3 10*3/uL — ABNORMAL HIGH (ref 4.0–10.5)
nRBC: 0 % (ref 0.0–0.2)

## 2019-07-01 MED ORDER — IOHEXOL 350 MG/ML SOLN
100.0000 mL | Freq: Once | INTRAVENOUS | Status: AC | PRN
  Administered 2019-07-01: 100 mL via INTRAVENOUS

## 2019-07-01 MED ORDER — SODIUM CHLORIDE 0.9 % IV SOLN
INTRAVENOUS | Status: DC
  Administered 2019-07-01: 17:00:00 via INTRAVENOUS

## 2019-07-01 MED ORDER — LACTATED RINGERS IV BOLUS
1000.0000 mL | Freq: Once | INTRAVENOUS | Status: AC
  Administered 2019-07-01: 1000 mL via INTRAVENOUS

## 2019-07-01 MED ORDER — POTASSIUM CHLORIDE CRYS ER 20 MEQ PO TBCR
20.0000 meq | EXTENDED_RELEASE_TABLET | Freq: Once | ORAL | Status: AC
  Administered 2019-07-01: 16:00:00 20 meq via ORAL
  Filled 2019-07-01: qty 1

## 2019-07-01 NOTE — ED Notes (Signed)
Pt wheeled to waiting room with sister.. Pt verbalized understanding of discharge instructions.

## 2019-07-01 NOTE — ED Provider Notes (Signed)
Pt received at change of shift with CTA pending. Please see previous EDP note for full HPI/H&P/MDM. 78yo F, c/o lightheadedness. BP stable while in ED. Labs per baseline. Potassium repleted PO. Pt has tol PO well without N/V. Pt has received IVF bolus; not orthostatic on VS. Pt does not stand or walk s/p knee surgery. No clear indication for admission at this time. Pt and family would like to go home now. Dx and testing, as well as incidental finding(s), d/w pt and family.  Questions answered.  Verb understanding, agreeable to d/c home with outpt f/u.    Patient Vitals for the past 24 hrs:  BP Temp Temp src Pulse Resp SpO2 Height Weight  07/01/19 1224 - - - - - - 5\' 6"  (1.676 m) 63.5 kg  07/01/19 1223 124/63 98.7 F (37.1 C) Oral (!) 107 20 95 % - -  07/01/19 1222 - - - - - - 5\' 6"  (1.676 m) 63.5 kg   18:05 Orthostatic Vital Signs MC  Orthostatic Lying   BP- Lying: 149/79  Pulse- Lying: 100      Orthostatic Sitting  BP- Sitting: 154/77  Pulse- Sitting: 113    Results for orders placed or performed during the hospital encounter of 07/01/19  CBC  Result Value Ref Range   WBC 11.3 (H) 4.0 - 10.5 K/uL   RBC 2.64 (L) 3.87 - 5.11 MIL/uL   Hemoglobin 8.3 (L) 12.0 - 15.0 g/dL   HCT 26.1 (L) 36.0 - 46.0 %   MCV 98.9 80.0 - 100.0 fL   MCH 31.4 26.0 - 34.0 pg   MCHC 31.8 30.0 - 36.0 g/dL   RDW 13.4 11.5 - 15.5 %   Platelets 341 150 - 400 K/uL   nRBC 0.0 0.0 - 0.2 %  Basic metabolic panel  Result Value Ref Range   Sodium 135 135 - 145 mmol/L   Potassium 3.3 (L) 3.5 - 5.1 mmol/L   Chloride 102 98 - 111 mmol/L   CO2 25 22 - 32 mmol/L   Glucose, Bld 115 (H) 70 - 99 mg/dL   BUN 10 8 - 23 mg/dL   Creatinine, Ser 0.64 0.44 - 1.00 mg/dL   Calcium 8.5 (L) 8.9 - 10.3 mg/dL   GFR calc non Af Amer >60 >60 mL/min   GFR calc Af Amer >60 >60 mL/min   Anion gap 8 5 - 15  Urinalysis, Routine w reflex microscopic  Result Value Ref Range   Color, Urine YELLOW YELLOW   APPearance CLEAR CLEAR    Specific Gravity, Urine 1.003 (L) 1.005 - 1.030   pH 8.0 5.0 - 8.0   Glucose, UA NEGATIVE NEGATIVE mg/dL   Hgb urine dipstick SMALL (A) NEGATIVE   Bilirubin Urine NEGATIVE NEGATIVE   Ketones, ur NEGATIVE NEGATIVE mg/dL   Protein, ur NEGATIVE NEGATIVE mg/dL   Nitrite NEGATIVE NEGATIVE   Leukocytes,Ua NEGATIVE NEGATIVE   RBC / HPF 0-5 0 - 5 RBC/hpf   WBC, UA 0-5 0 - 5 WBC/hpf   Bacteria, UA NONE SEEN NONE SEEN   Squamous Epithelial / LPF 0-5 0 - 5     Ct Angio Chest Pe W And/or Wo Contrast Result Date: 07/01/2019 CLINICAL DATA:  Chest pain and near syncopal episode. Recent total knee arthroplasty. EXAM: CT ANGIOGRAPHY CHEST WITH CONTRAST TECHNIQUE: Multidetector CT imaging of the chest was performed using the standard protocol during bolus administration of intravenous contrast. Multiplanar CT image reconstructions and MIPs were obtained to evaluate the vascular anatomy. CONTRAST:  119mL OMNIPAQUE IOHEXOL 350 MG/ML SOLN COMPARISON:  None. FINDINGS: Cardiovascular: The heart is within normal limits in size for age. No pericardial effusion. There is moderate tortuosity, ectasia and calcification of the thoracic aorta but no aneurysm or dissection. The branch vessels are patent. Moderate three-vessel coronary artery calcifications are noted. The pulmonary arterial tree is fairly well opacified. No filling defects to suggest pulmonary embolism. Mediastinum/Nodes: No mediastinal or hilar mass or lymphadenopathy. The thyroid gland appears normal. The esophagus is unremarkable. Lungs/Pleura: No acute pulmonary findings. No worrisome pulmonary lesions. No pleural effusions or pleural nodules. Mild chronic bronchitic type changes and some areas of linear scarring. Biapical pleural and parenchymal scarring is also noted. Upper Abdomen: No significant upper abdominal findings. Simple left hepatic lobe cyst. Moderate atherosclerotic calcifications involving the upper abdominal aorta. Musculoskeletal: No breast  masses. Rim calcified breast prostheses are noted bilaterally. No supraclavicular or axillary adenopathy. The bony thorax is intact.  No worrisome bone lesions. Review of the MIP images confirms the above findings. IMPRESSION: 1. No CT findings for pulmonary embolism. 2. Tortuosity, ectasia and calcification of the thoracic aorta but no dissection or aneurysm. 3. No acute pulmonary findings or worrisome pulmonary lesions. 4. Moderate atherosclerotic calcifications involving the aorta and coronary arteries. Aortic Atherosclerosis (ICD10-I70.0). Electronically Signed   By: Marijo Sanes M.D.   On: 07/01/2019 16:12        Francine Graven, DO 07/01/19 2031

## 2019-07-01 NOTE — ED Triage Notes (Signed)
Right knee surgery on Sept 14 th.

## 2019-07-01 NOTE — Discharge Instructions (Addendum)
Take your usual prescriptions as previously directed. Move slowly when changing positions.  Call your regular medical doctor tomorrow to schedule a follow up appointment within the next 1 to 2 days.  Return to the Emergency Department immediately sooner if worsening.

## 2019-07-01 NOTE — ED Provider Notes (Signed)
[facility] EMERGENCY DEPARTMENT Provider Note   CSN: [place]:[id number] Arrival date & time: 07/01/19  1209     History   Chief Complaint Chief Complaint  Patient presents with  . Near Syncope    HPI Claire West is a 78 y.o. female.     HPI   5yF with near syncope. She is s/p R knee replacement on 06/22/19. Post-op complicated by delirium felt to be metabolic in patient with mild underlying cognitive impairment with recent surgery/hospitalization. Ended up being discharged on 06/26/19. Since then she has had recurrent issues with near syncope. Worse when trying to get up/activity. Feels as if may pass out and "saw stars" on one occasion. Daughter has been encouraging fluids. Pt says her knee has been feeling very well. SBP for EMS 90/50s. Improved to 130/70 with no intervention though. Reportedly hypoxic 85% on RA although pt denies shortness of breath to me. Daughter says pt looks pale to her. No BRBPR or melena. Is on prophylactic xarelto post-op.   Past Medical History:  Diagnosis Date  . Arthritis   . Depression   . Gait instability    walks with cane  . GERD (gastroesophageal reflux disease)   . History of blood transfusion   . Hyperlipemia   . Hypertension   . Knee pain   . Metabolic encephalopathy   . Mild cognitive impairment with memory loss   . Seasonal allergies   . Skin cancer    Patient Active Problem List   Diagnosis Date Noted  . Mild cognitive impairment 06/25/2019  . Depression 06/25/2019  . Falls frequently 10/28/2018  . TIA (transient ischemic attack) 08/23/2018  . Delirium in remission 07/07/2018  . OA (osteoarthritis) of knee 05/05/2018  . Amnestic MCI (mild cognitive impairment with memory loss) 12/24/2017  . Acute metabolic encephalopathy [id number]  . Altered mental status 11/06/2017  . Hyponatremia 02/12/2016  . Hypokalemia 02/12/2016  . Dehydration 02/12/2016  . Elevated LFTs 02/12/2016  . HTN (hypertension) 02/12/2016  . GERD  (gastroesophageal reflux disease) 02/12/2016    Past Surgical History:  Procedure Laterality Date  . ABDOMINAL HYSTERECTOMY    . ARTHROSCOPIC REPAIR ACL Left   . COLONOSCOPY    . COLONOSCOPY N/A 11/23/2015   Procedure: COLONOSCOPY;  Surgeon: Rogene Houston, MD;  Location: [facility];  Service: Endoscopy;  Laterality: N/A;  830  . EYE SURGERY Bilateral    Cataracts  . SKIN CANCER EXCISION     Right shoulder  . TOTAL KNEE ARTHROPLASTY Left 05/05/2018   Procedure: LEFT TOTAL KNEE ARTHROPLASTY;  Surgeon: Gaynelle Arabian, MD;  Location: [facility] ORS;  Service: Orthopedics;  Laterality: Left;  with block  . TOTAL KNEE ARTHROPLASTY Right 06/22/2019   Procedure: TOTAL KNEE ARTHROPLASTY;  Surgeon: Gaynelle Arabian, MD;  Location: [facility] ORS;  Service: Orthopedics;  Laterality: Right;  31min    OB History    Gravida  1   Para  1   Term  1   Preterm      AB      Living  1     SAB      TAB      Ectopic      Multiple      Live Births             Home Medications    Prior to Admission medications   Medication Sig Start Date End Date Taking? Authorizing Provider  acetaminophen (TYLENOL) 500 MG tablet Take 500 mg by mouth at bedtime.  [provider]  amLODipine (NORVASC) 5 MG tablet Take 1 tablet (5 mg total) by mouth at bedtime. 06/26/19   Desiree Hane, MD  escitalopram (LEXAPRO) 10 MG tablet Take 10 mg by mouth at bedtime. 03/07/18   [provider]  ferrous sulfate 325 (65 FE) MG tablet Take 1 tablet (325 mg total) by mouth 3 (three) times daily with meals for 19 days. Take for two weeks as tolerated. 06/24/19 07/13/19  Maurice March, PA-C  fexofenadine (ALLEGRA) 180 MG tablet Take 180 mg by mouth daily as needed for allergies.     [provider]  hydrocortisone cream 1 % Apply 1 application topically 2 (two) times daily as needed for itching.    [provider]  methocarbamol (ROBAXIN) 500 MG tablet Take 1 tablet (500 mg total) by mouth  every 6 (six) hours as needed for muscle spasms. 06/24/19   Maurice March, PA-C  omeprazole (PRILOSEC) 20 MG capsule Take 20-40 mg by mouth See admin instructions. Take 40mg  in the AM and 20mg  in the PM. 04/18/15   [provider]  potassium chloride (K-DUR) 10 MEQ tablet Take 1 tablet (10 mEq total) by mouth daily. Patient taking differently: Take 10 mEq by mouth daily with supper.  11/04/17   Noemi Chapel, MD  pravastatin (PRAVACHOL) 20 MG tablet Take 20 mg by mouth at bedtime.  03/10/19   [provider]  rivaroxaban (XARELTO) 10 MG TABS tablet Take 1 tablet (10 mg total) by mouth daily with breakfast for 19 days. Take one tablet Xarelto once a day for three weeks following surgery. Then resume one baby Aspirin (81 mg) once a day. 06/24/19 07/13/19  Maurice March, PA-C  traMADol (ULTRAM) 50 MG tablet Take 1-2 tablets (50-100 mg total) by mouth every 6 (six) hours as needed for moderate pain. 06/24/19   Maurice March, PA-C  triamcinolone cream (KENALOG) 0.1 % Apply 1 application topically 2 (two) times daily as needed (itching).    [provider]    Family History Family History  Problem Relation Age of Onset  . Lung cancer Mother   . Lung cancer Sister     Social History Social History   Tobacco Use  . Smoking status: Never Smoker  . Smokeless tobacco: Never Used  Substance Use Topics  . Alcohol use: Yes    Comment: occ  . Drug use: No     Allergies   Penicillins, Adhesive [tape], Codeine, Neosporin [neomycin-bacitracin zn-polymyx], and Sulfa antibiotics   Review of Systems Review of Systems All systems reviewed and negative, other than as noted in HPI.   Physical Exam Updated Vital Signs BP 124/63 (BP Location: Right Arm)   Pulse (!) 107   Temp 98.7 F (37.1 C) (Oral)   Resp 20   Ht 5\' 6"  (1.676 m)   Wt 63.5 kg   SpO2 95%   BMI 22.60 kg/m   Physical Exam Vitals signs and nursing note reviewed.  Constitutional:      General:  She is not in acute distress.    Appearance: She is well-developed.  HENT:     Head: Normocephalic and atraumatic.  Eyes:     General:        Right eye: No discharge.        Left eye: No discharge.     Conjunctiva/sclera: Conjunctivae normal.  Neck:     Musculoskeletal: Neck supple.  Cardiovascular:     Rate and Rhythm: Normal rate and  regular rhythm.     Heart sounds: Normal heart sounds. No murmur. No friction rub. No gallop.   Pulmonary:     Effort: Pulmonary effort is normal. No respiratory distress.     Breath sounds: Normal breath sounds.  Abdominal:     General: There is no distension.     Palpations: Abdomen is soft.     Tenderness: There is no abdominal tenderness.  Musculoskeletal:     Comments: R knee incision with steristrips. Looks good/consistent with expected post-op healing.   Skin:    General: Skin is warm and dry.  Neurological:     Mental Status: She is alert.  Psychiatric:        Behavior: Behavior normal.        Thought Content: Thought content normal.      ED Treatments / Results  Labs (all labs ordered are listed, but only abnormal results are displayed) Labs Reviewed  CBC - Abnormal; Notable for the following components:      Result Value   WBC 11.3 (*)    RBC 2.64 (*)    Hemoglobin 8.3 (*)    HCT 26.1 (*)    All other components within normal limits  BASIC METABOLIC PANEL - Abnormal; Notable for the following components:   Potassium 3.3 (*)    Glucose, Bld 115 (*)    Calcium 8.5 (*)    All other components within normal limits  SARS CORONAVIRUS 2 (TAT 6-24 HRS)  URINE CULTURE  URINALYSIS, ROUTINE W REFLEX MICROSCOPIC    EKG EKG Interpretation  Date/Time:  Wednesday July 01 2019 12:30:16 EDT Ventricular Rate:  103 PR Interval:    QRS Duration: 92 QT Interval:  351 QTC Calculation: 460 R Axis:   -8 Text Interpretation:  Sinus tachycardia Low voltage, precordial leads Confirmed by Virgel Manifold [phone]) on 07/01/2019 3:06:55  PM   Radiology No results found.  Procedures Procedures (including critical care time)  Medications Ordered in ED Medications  0.9 %  sodium chloride infusion (has no administration in time range)  iohexol (OMNIPAQUE) 350 MG/ML injection 100 mL (has no administration in time range)  potassium chloride SA (K-DUR) CR tablet 20 mEq (has no administration in time range)  lactated ringers bolus 1,000 mL (1,000 mLs Intravenous New Bag/Given 07/01/19 1504)     Initial Impression / Assessment and Plan / ED Course  I have reviewed the triage vital signs and the nursing notes.  Pertinent labs & imaging results that were available during my care of the patient were reviewed by me and considered in my medical decision making (see chart for details).    78yF with near syncope. Several episodes since DC from recent knee replacement. Reportedly hypoxic for EMS although not in ED and denies dyspnea to me. She is on xarelto for prophylactic reasons but, with mild resting tachycardia, will obtain CTa. Anemic but fairly stable from recent. BP ok. Will check orthostatics and give some IVF.   Final Clinical Impressions(s) / ED Diagnoses   Final diagnoses:  Near syncope    ED Discharge Orders    None       Virgel Manifold, MD 07/05/19 [phone]

## 2019-07-01 NOTE — ED Triage Notes (Signed)
Near syncope episode.  Denies LOC.  EMS reports BP 92/50 in arrival,  BP increased to 131/776 once IV obtained.  #2og jelco to left AC.  CBG 143.  O2 Sats 84-85% , placed on O2 @ 2L/M via EMS.  SR on monitor.

## 2019-07-01 NOTE — ED Notes (Signed)
Pt does not ambulate, right knee surgery 06/22/2019. Uses Bidex machine at [facility] facility in [place] for therapy.

## 2019-07-01 NOTE — ED Notes (Signed)
Pt. was unable to stand for orthostatic VS.

## 2019-07-02 LAB — SARS CORONAVIRUS 2 (TAT 6-24 HRS): SARS Coronavirus 2: NEGATIVE

## 2019-07-03 LAB — URINE CULTURE: Culture: 40000 — AB

## 2019-07-04 ENCOUNTER — Telehealth: Payer: Self-pay | Admitting: Emergency Medicine

## 2019-07-04 NOTE — Telephone Encounter (Signed)
Post ED Visit - Positive Culture Follow-up  Culture report reviewed by antimicrobial stewardship pharmacist: [place] Team []  Elenor Quinones, Pharm.D. []  Heide Guile, Pharm.D., BCPS AQ-ID []  Parks Neptune, Pharm.D., BCPS []  Alycia Rossetti, Pharm.D., BCPS []  [place], Pharm.D., BCPS, AAHIVP []  Legrand Como, Pharm.D., BCPS, AAHIVP []  Salome Arnt, PharmD, BCPS []  Johnnette Gourd, PharmD, BCPS []  Hughes Better, PharmD, BCPS [x]  Elicia Lamp, PharmD []  Laqueta Linden, PharmD, BCPS []  Albertina Parr, PharmD  [place] Team []  Leodis Sias, PharmD []  Lindell Spar, PharmD []  Royetta Asal, PharmD []  Graylin Shiver, Rph []  Rema Fendt) Glennon Mac, PharmD []  Arlyn Dunning, PharmD []  Netta Cedars, PharmD []  Dia Sitter, PharmD []  Leone Haven, PharmD []  Gretta Arab, PharmD []  Theodis Shove, PharmD []  Peggyann Juba, PharmD []  Reuel Boom, PharmD   Positive urine culture no further patient follow-up is required at this time.  Sandi Raveling Talisha Erby 07/04/2019, 1:47 PM

## 2019-07-04 NOTE — Progress Notes (Signed)
ED Antimicrobial Stewardship Positive Culture Follow Up   Claire West is an 78 y.o. female who presented to [facility] on 07/01/2019 with a chief complaint of  Chief Complaint  Patient presents with  . Near Syncope    Recent Results (from the past 720 hour(s))  Surgical pcr screen     Status: None   Collection Time: 06/10/19  2:10 PM   Specimen: Nasal Mucosa; Nasal Swab  Result Value Ref Range Status   MRSA, PCR NEGATIVE NEGATIVE Final   Staphylococcus aureus NEGATIVE NEGATIVE Final    Comment: (NOTE) The Xpert SA Assay (FDA approved for NASAL specimens in patients 68 years of age and older), is one component of a comprehensive surveillance program. It is not intended to diagnose infection nor to guide or monitor treatment. Performed at [facility], [place] [address], [place], [place] [address]   SARS CORONAVIRUS 2 (TAT 6-24 HRS) Nasopharyngeal Nasopharyngeal Swab     Status: None   Collection Time: 06/18/19  7:11 AM   Specimen: Nasopharyngeal Swab  Result Value Ref Range Status   SARS Coronavirus 2 NEGATIVE NEGATIVE Final    Comment: (NOTE) SARS-CoV-2 target nucleic acids are NOT DETECTED. The SARS-CoV-2 RNA is generally detectable in upper and lower respiratory specimens during the acute phase of infection. Negative results do not preclude SARS-CoV-2 infection, do not rule out co-infections with other pathogens, and should not be used as the sole basis for treatment or other patient management decisions. Negative results must be combined with clinical observations, patient history, and epidemiological information. The expected result is Negative. Fact Sheet for Patients: SugarRoll.be Fact Sheet for Healthcare Providers: https://www.woods-mathews.com/ This test is not yet approved or cleared by the [place] FDA and  has been authorized for detection and/or diagnosis of SARS-CoV-2 by FDA under an Emergency  Use Authorization (EUA). This EUA will remain  in effect (meaning this test can be used) for the duration of the COVID-19 declaration under Section 56 4(b)(1) of the Act, 21 U.S.C. section 360bbb-3(b)(1), unless the authorization is terminated or revoked sooner. Performed at [facility], [place] [address] [place], [place] [address]   SARS CORONAVIRUS 2 (TAT 6-24 HRS) Nasopharyngeal Nasopharyngeal Swab     Status: None   Collection Time: 07/01/19  2:42 PM   Specimen: Nasopharyngeal Swab  Result Value Ref Range Status   SARS Coronavirus 2 NEGATIVE NEGATIVE Final    Comment: (NOTE) SARS-CoV-2 target nucleic acids are NOT DETECTED. The SARS-CoV-2 RNA is generally detectable in upper and lower respiratory specimens during the acute phase of infection. Negative results do not preclude SARS-CoV-2 infection, do not rule out co-infections with other pathogens, and should not be used as the sole basis for treatment or other patient management decisions. Negative results must be combined with clinical observations, patient history, and epidemiological information. The expected result is Negative. Fact Sheet for Patients: SugarRoll.be Fact Sheet for Healthcare Providers: https://www.woods-mathews.com/ This test is not yet approved or cleared by the [place] FDA and  has been authorized for detection and/or diagnosis of SARS-CoV-2 by FDA under an Emergency Use Authorization (EUA). This EUA will remain  in effect (meaning this test can be used) for the duration of the COVID-19 declaration under Section 56 4(b)(1) of the Act, 21 U.S.C. section 360bbb-3(b)(1), unless the authorization is terminated or revoked sooner. Performed at [facility], [place] [address], [place], [place] [address]   Urine culture     Status: Abnormal  Collection Time: 07/01/19  3:31 PM   Specimen: Urine, Clean Catch  Result Value Ref Range Status   Specimen  Description   Final    URINE, CLEAN CATCH Performed at [facility], [address] [place], [place] [address]    Special Requests   Final    NONE Performed at [facility], [address], [place], [place] [address]    Culture 40,000 COLONIES/mL ESCHERICHIA COLI (A)  Final   Report Status 07/03/2019 FINAL  Final   Organism ID, Bacteria ESCHERICHIA COLI (A)  Final      Susceptibility   Escherichia coli - MIC*    AMPICILLIN 4 SENSITIVE Sensitive     CEFAZOLIN <=4 SENSITIVE Sensitive     CEFTRIAXONE <=1 SENSITIVE Sensitive     CIPROFLOXACIN <=0.25 SENSITIVE Sensitive     GENTAMICIN <=1 SENSITIVE Sensitive     IMIPENEM <=0.25 SENSITIVE Sensitive     NITROFURANTOIN <=16 SENSITIVE Sensitive     TRIMETH/SULFA <=20 SENSITIVE Sensitive     AMPICILLIN/SULBACTAM <=2 SENSITIVE Sensitive     PIP/TAZO <=4 SENSITIVE Sensitive     Extended ESBL NEGATIVE Sensitive     * 40,000 COLONIES/mL ESCHERICHIA COLI    [x]  Patient discharged originally without antimicrobial agent  New antibiotic prescription: No further treatment indicated for asymptomatic bacteriuria, likely colonized with this organism.  ED Provider: Madilyn Hook, PA-C   Elicia Lamp P 07/04/2019, 11:40 AM Clinical Pharmacist Monday - Friday phone -  [phone] Saturday - Sunday phone - [phone]

## 2019-07-06 DIAGNOSIS — Z6824 Body mass index (BMI) 24.0-24.9, adult: Secondary | ICD-10-CM | POA: Diagnosis not present

## 2019-07-06 DIAGNOSIS — R42 Dizziness and giddiness: Secondary | ICD-10-CM | POA: Diagnosis not present

## 2019-07-06 DIAGNOSIS — R8290 Unspecified abnormal findings in urine: Secondary | ICD-10-CM | POA: Diagnosis not present

## 2019-07-06 DIAGNOSIS — I959 Hypotension, unspecified: Secondary | ICD-10-CM | POA: Diagnosis not present

## 2019-07-06 DIAGNOSIS — Z1389 Encounter for screening for other disorder: Secondary | ICD-10-CM | POA: Diagnosis not present

## 2019-07-06 DIAGNOSIS — D62 Acute posthemorrhagic anemia: Secondary | ICD-10-CM | POA: Diagnosis not present

## 2019-07-09 DIAGNOSIS — Z96651 Presence of right artificial knee joint: Secondary | ICD-10-CM | POA: Diagnosis not present

## 2019-07-09 DIAGNOSIS — R2689 Other abnormalities of gait and mobility: Secondary | ICD-10-CM | POA: Diagnosis not present

## 2019-07-09 DIAGNOSIS — Z471 Aftercare following joint replacement surgery: Secondary | ICD-10-CM | POA: Diagnosis not present

## 2019-07-10 DIAGNOSIS — R2689 Other abnormalities of gait and mobility: Secondary | ICD-10-CM | POA: Diagnosis not present

## 2019-07-10 DIAGNOSIS — Z96651 Presence of right artificial knee joint: Secondary | ICD-10-CM | POA: Diagnosis not present

## 2019-07-10 DIAGNOSIS — Z471 Aftercare following joint replacement surgery: Secondary | ICD-10-CM | POA: Diagnosis not present

## 2019-07-13 DIAGNOSIS — R2689 Other abnormalities of gait and mobility: Secondary | ICD-10-CM | POA: Diagnosis not present

## 2019-07-13 DIAGNOSIS — Z96651 Presence of right artificial knee joint: Secondary | ICD-10-CM | POA: Diagnosis not present

## 2019-07-13 DIAGNOSIS — Z471 Aftercare following joint replacement surgery: Secondary | ICD-10-CM | POA: Diagnosis not present

## 2019-07-15 DIAGNOSIS — Z96651 Presence of right artificial knee joint: Secondary | ICD-10-CM | POA: Diagnosis not present

## 2019-07-15 DIAGNOSIS — R2689 Other abnormalities of gait and mobility: Secondary | ICD-10-CM | POA: Diagnosis not present

## 2019-07-15 DIAGNOSIS — Z471 Aftercare following joint replacement surgery: Secondary | ICD-10-CM | POA: Diagnosis not present

## 2019-07-17 DIAGNOSIS — Z96651 Presence of right artificial knee joint: Secondary | ICD-10-CM | POA: Diagnosis not present

## 2019-07-17 DIAGNOSIS — R2689 Other abnormalities of gait and mobility: Secondary | ICD-10-CM | POA: Diagnosis not present

## 2019-07-17 DIAGNOSIS — Z471 Aftercare following joint replacement surgery: Secondary | ICD-10-CM | POA: Diagnosis not present

## 2019-07-21 DIAGNOSIS — Z471 Aftercare following joint replacement surgery: Secondary | ICD-10-CM | POA: Diagnosis not present

## 2019-07-21 DIAGNOSIS — Z96651 Presence of right artificial knee joint: Secondary | ICD-10-CM | POA: Diagnosis not present

## 2019-07-21 DIAGNOSIS — R2689 Other abnormalities of gait and mobility: Secondary | ICD-10-CM | POA: Diagnosis not present

## 2019-07-22 DIAGNOSIS — Z471 Aftercare following joint replacement surgery: Secondary | ICD-10-CM | POA: Diagnosis not present

## 2019-07-22 DIAGNOSIS — R2689 Other abnormalities of gait and mobility: Secondary | ICD-10-CM | POA: Diagnosis not present

## 2019-07-22 DIAGNOSIS — Z96651 Presence of right artificial knee joint: Secondary | ICD-10-CM | POA: Diagnosis not present

## 2019-07-24 DIAGNOSIS — D649 Anemia, unspecified: Secondary | ICD-10-CM | POA: Diagnosis not present

## 2019-07-24 DIAGNOSIS — Z23 Encounter for immunization: Secondary | ICD-10-CM | POA: Diagnosis not present

## 2019-07-24 DIAGNOSIS — Z96651 Presence of right artificial knee joint: Secondary | ICD-10-CM | POA: Diagnosis not present

## 2019-07-24 DIAGNOSIS — R2689 Other abnormalities of gait and mobility: Secondary | ICD-10-CM | POA: Diagnosis not present

## 2019-07-24 DIAGNOSIS — Z471 Aftercare following joint replacement surgery: Secondary | ICD-10-CM | POA: Diagnosis not present

## 2019-07-24 DIAGNOSIS — Z6824 Body mass index (BMI) 24.0-24.9, adult: Secondary | ICD-10-CM | POA: Diagnosis not present

## 2019-07-24 DIAGNOSIS — I10 Essential (primary) hypertension: Secondary | ICD-10-CM | POA: Diagnosis not present

## 2019-07-27 DIAGNOSIS — R2689 Other abnormalities of gait and mobility: Secondary | ICD-10-CM | POA: Diagnosis not present

## 2019-07-27 DIAGNOSIS — Z96651 Presence of right artificial knee joint: Secondary | ICD-10-CM | POA: Insufficient documentation

## 2019-07-27 DIAGNOSIS — Z471 Aftercare following joint replacement surgery: Secondary | ICD-10-CM | POA: Diagnosis not present

## 2019-07-28 DIAGNOSIS — Z471 Aftercare following joint replacement surgery: Secondary | ICD-10-CM | POA: Diagnosis not present

## 2019-07-28 DIAGNOSIS — Z96651 Presence of right artificial knee joint: Secondary | ICD-10-CM | POA: Diagnosis not present

## 2019-07-28 DIAGNOSIS — M205X9 Other deformities of toe(s) (acquired), unspecified foot: Secondary | ICD-10-CM | POA: Diagnosis not present

## 2019-07-30 DIAGNOSIS — Z471 Aftercare following joint replacement surgery: Secondary | ICD-10-CM | POA: Diagnosis not present

## 2019-07-30 DIAGNOSIS — R2689 Other abnormalities of gait and mobility: Secondary | ICD-10-CM | POA: Diagnosis not present

## 2019-07-30 DIAGNOSIS — Z96651 Presence of right artificial knee joint: Secondary | ICD-10-CM | POA: Diagnosis not present

## 2019-07-31 DIAGNOSIS — R2689 Other abnormalities of gait and mobility: Secondary | ICD-10-CM | POA: Diagnosis not present

## 2019-07-31 DIAGNOSIS — Z96651 Presence of right artificial knee joint: Secondary | ICD-10-CM | POA: Diagnosis not present

## 2019-07-31 DIAGNOSIS — Z471 Aftercare following joint replacement surgery: Secondary | ICD-10-CM | POA: Diagnosis not present

## 2019-08-03 DIAGNOSIS — R2689 Other abnormalities of gait and mobility: Secondary | ICD-10-CM | POA: Diagnosis not present

## 2019-08-03 DIAGNOSIS — Z471 Aftercare following joint replacement surgery: Secondary | ICD-10-CM | POA: Diagnosis not present

## 2019-08-03 DIAGNOSIS — Z96651 Presence of right artificial knee joint: Secondary | ICD-10-CM | POA: Diagnosis not present

## 2019-08-05 DIAGNOSIS — R2689 Other abnormalities of gait and mobility: Secondary | ICD-10-CM | POA: Diagnosis not present

## 2019-08-05 DIAGNOSIS — Z96651 Presence of right artificial knee joint: Secondary | ICD-10-CM | POA: Diagnosis not present

## 2019-08-05 DIAGNOSIS — Z471 Aftercare following joint replacement surgery: Secondary | ICD-10-CM | POA: Diagnosis not present

## 2019-08-07 DIAGNOSIS — Z96651 Presence of right artificial knee joint: Secondary | ICD-10-CM | POA: Diagnosis not present

## 2019-08-07 DIAGNOSIS — R2689 Other abnormalities of gait and mobility: Secondary | ICD-10-CM | POA: Diagnosis not present

## 2019-08-07 DIAGNOSIS — Z471 Aftercare following joint replacement surgery: Secondary | ICD-10-CM | POA: Diagnosis not present

## 2019-08-10 ENCOUNTER — Ambulatory Visit: Payer: Self-pay | Admitting: Adult Health

## 2019-08-10 DIAGNOSIS — R2689 Other abnormalities of gait and mobility: Secondary | ICD-10-CM | POA: Diagnosis not present

## 2019-08-10 DIAGNOSIS — Z96651 Presence of right artificial knee joint: Secondary | ICD-10-CM | POA: Diagnosis not present

## 2019-08-10 DIAGNOSIS — Z471 Aftercare following joint replacement surgery: Secondary | ICD-10-CM | POA: Diagnosis not present

## 2019-08-12 DIAGNOSIS — Z96651 Presence of right artificial knee joint: Secondary | ICD-10-CM | POA: Diagnosis not present

## 2019-08-12 DIAGNOSIS — R2689 Other abnormalities of gait and mobility: Secondary | ICD-10-CM | POA: Diagnosis not present

## 2019-08-12 DIAGNOSIS — Z471 Aftercare following joint replacement surgery: Secondary | ICD-10-CM | POA: Diagnosis not present

## 2019-08-14 DIAGNOSIS — Z471 Aftercare following joint replacement surgery: Secondary | ICD-10-CM | POA: Diagnosis not present

## 2019-08-14 DIAGNOSIS — Z96651 Presence of right artificial knee joint: Secondary | ICD-10-CM | POA: Diagnosis not present

## 2019-08-14 DIAGNOSIS — R2689 Other abnormalities of gait and mobility: Secondary | ICD-10-CM | POA: Diagnosis not present

## 2019-08-17 DIAGNOSIS — Z96651 Presence of right artificial knee joint: Secondary | ICD-10-CM | POA: Diagnosis not present

## 2019-08-17 DIAGNOSIS — Z471 Aftercare following joint replacement surgery: Secondary | ICD-10-CM | POA: Diagnosis not present

## 2019-08-17 DIAGNOSIS — R2689 Other abnormalities of gait and mobility: Secondary | ICD-10-CM | POA: Diagnosis not present

## 2019-08-19 DIAGNOSIS — R2689 Other abnormalities of gait and mobility: Secondary | ICD-10-CM | POA: Diagnosis not present

## 2019-08-19 DIAGNOSIS — Z471 Aftercare following joint replacement surgery: Secondary | ICD-10-CM | POA: Diagnosis not present

## 2019-08-19 DIAGNOSIS — Z96651 Presence of right artificial knee joint: Secondary | ICD-10-CM | POA: Diagnosis not present

## 2019-08-21 DIAGNOSIS — R2689 Other abnormalities of gait and mobility: Secondary | ICD-10-CM | POA: Diagnosis not present

## 2019-08-21 DIAGNOSIS — Z471 Aftercare following joint replacement surgery: Secondary | ICD-10-CM | POA: Diagnosis not present

## 2019-08-21 DIAGNOSIS — Z96651 Presence of right artificial knee joint: Secondary | ICD-10-CM | POA: Diagnosis not present

## 2019-08-24 DIAGNOSIS — Z6824 Body mass index (BMI) 24.0-24.9, adult: Secondary | ICD-10-CM | POA: Diagnosis not present

## 2019-08-24 DIAGNOSIS — R2689 Other abnormalities of gait and mobility: Secondary | ICD-10-CM | POA: Diagnosis not present

## 2019-08-24 DIAGNOSIS — Z471 Aftercare following joint replacement surgery: Secondary | ICD-10-CM | POA: Diagnosis not present

## 2019-08-24 DIAGNOSIS — R8290 Unspecified abnormal findings in urine: Secondary | ICD-10-CM | POA: Diagnosis not present

## 2019-08-24 DIAGNOSIS — I10 Essential (primary) hypertension: Secondary | ICD-10-CM | POA: Diagnosis not present

## 2019-08-24 DIAGNOSIS — R000 Tachycardia, unspecified: Secondary | ICD-10-CM | POA: Diagnosis not present

## 2019-08-24 DIAGNOSIS — Z96651 Presence of right artificial knee joint: Secondary | ICD-10-CM | POA: Diagnosis not present

## 2019-08-26 DIAGNOSIS — Z471 Aftercare following joint replacement surgery: Secondary | ICD-10-CM | POA: Diagnosis not present

## 2019-08-26 DIAGNOSIS — Z96651 Presence of right artificial knee joint: Secondary | ICD-10-CM | POA: Diagnosis not present

## 2019-08-26 DIAGNOSIS — R2689 Other abnormalities of gait and mobility: Secondary | ICD-10-CM | POA: Diagnosis not present

## 2019-08-28 DIAGNOSIS — R2689 Other abnormalities of gait and mobility: Secondary | ICD-10-CM | POA: Diagnosis not present

## 2019-08-28 DIAGNOSIS — Z96651 Presence of right artificial knee joint: Secondary | ICD-10-CM | POA: Diagnosis not present

## 2019-08-28 DIAGNOSIS — Z471 Aftercare following joint replacement surgery: Secondary | ICD-10-CM | POA: Diagnosis not present

## 2019-09-11 DIAGNOSIS — E876 Hypokalemia: Secondary | ICD-10-CM | POA: Diagnosis not present

## 2019-09-11 DIAGNOSIS — E7849 Other hyperlipidemia: Secondary | ICD-10-CM | POA: Diagnosis not present

## 2019-09-11 DIAGNOSIS — E559 Vitamin D deficiency, unspecified: Secondary | ICD-10-CM | POA: Diagnosis not present

## 2019-09-11 DIAGNOSIS — E538 Deficiency of other specified B group vitamins: Secondary | ICD-10-CM | POA: Diagnosis not present

## 2019-09-11 DIAGNOSIS — G459 Transient cerebral ischemic attack, unspecified: Secondary | ICD-10-CM | POA: Diagnosis not present

## 2019-09-11 DIAGNOSIS — Z6824 Body mass index (BMI) 24.0-24.9, adult: Secondary | ICD-10-CM | POA: Diagnosis not present

## 2019-09-11 DIAGNOSIS — E871 Hypo-osmolality and hyponatremia: Secondary | ICD-10-CM | POA: Diagnosis not present

## 2019-09-11 DIAGNOSIS — D649 Anemia, unspecified: Secondary | ICD-10-CM | POA: Diagnosis not present

## 2019-09-11 DIAGNOSIS — Z0001 Encounter for general adult medical examination with abnormal findings: Secondary | ICD-10-CM | POA: Diagnosis not present

## 2019-09-11 DIAGNOSIS — I10 Essential (primary) hypertension: Secondary | ICD-10-CM | POA: Diagnosis not present

## 2019-09-11 DIAGNOSIS — I639 Cerebral infarction, unspecified: Secondary | ICD-10-CM | POA: Diagnosis not present

## 2019-10-31 DIAGNOSIS — Z23 Encounter for immunization: Secondary | ICD-10-CM | POA: Diagnosis not present

## 2019-11-05 DIAGNOSIS — M7541 Impingement syndrome of right shoulder: Secondary | ICD-10-CM | POA: Diagnosis not present

## 2019-11-10 DIAGNOSIS — R531 Weakness: Secondary | ICD-10-CM | POA: Diagnosis not present

## 2019-11-10 DIAGNOSIS — M25611 Stiffness of right shoulder, not elsewhere classified: Secondary | ICD-10-CM | POA: Diagnosis not present

## 2019-11-10 DIAGNOSIS — M7541 Impingement syndrome of right shoulder: Secondary | ICD-10-CM | POA: Diagnosis not present

## 2019-11-13 DIAGNOSIS — M7541 Impingement syndrome of right shoulder: Secondary | ICD-10-CM | POA: Diagnosis not present

## 2019-11-13 DIAGNOSIS — M25611 Stiffness of right shoulder, not elsewhere classified: Secondary | ICD-10-CM | POA: Diagnosis not present

## 2019-11-13 DIAGNOSIS — R531 Weakness: Secondary | ICD-10-CM | POA: Diagnosis not present

## 2019-11-16 DIAGNOSIS — Z6825 Body mass index (BMI) 25.0-25.9, adult: Secondary | ICD-10-CM | POA: Diagnosis not present

## 2019-11-16 DIAGNOSIS — N390 Urinary tract infection, site not specified: Secondary | ICD-10-CM | POA: Diagnosis not present

## 2019-11-16 DIAGNOSIS — F0390 Unspecified dementia without behavioral disturbance: Secondary | ICD-10-CM | POA: Diagnosis not present

## 2019-11-16 DIAGNOSIS — I639 Cerebral infarction, unspecified: Secondary | ICD-10-CM | POA: Diagnosis not present

## 2019-11-17 DIAGNOSIS — M25611 Stiffness of right shoulder, not elsewhere classified: Secondary | ICD-10-CM | POA: Diagnosis not present

## 2019-11-17 DIAGNOSIS — R531 Weakness: Secondary | ICD-10-CM | POA: Diagnosis not present

## 2019-11-17 DIAGNOSIS — M7541 Impingement syndrome of right shoulder: Secondary | ICD-10-CM | POA: Diagnosis not present

## 2019-11-19 DIAGNOSIS — M7541 Impingement syndrome of right shoulder: Secondary | ICD-10-CM | POA: Diagnosis not present

## 2019-11-19 DIAGNOSIS — M25611 Stiffness of right shoulder, not elsewhere classified: Secondary | ICD-10-CM | POA: Diagnosis not present

## 2019-11-19 DIAGNOSIS — R531 Weakness: Secondary | ICD-10-CM | POA: Diagnosis not present

## 2019-11-24 DIAGNOSIS — R531 Weakness: Secondary | ICD-10-CM | POA: Diagnosis not present

## 2019-11-24 DIAGNOSIS — M25611 Stiffness of right shoulder, not elsewhere classified: Secondary | ICD-10-CM | POA: Diagnosis not present

## 2019-11-24 DIAGNOSIS — M7541 Impingement syndrome of right shoulder: Secondary | ICD-10-CM | POA: Diagnosis not present

## 2019-11-30 DIAGNOSIS — Z23 Encounter for immunization: Secondary | ICD-10-CM | POA: Diagnosis not present

## 2019-12-04 DIAGNOSIS — R531 Weakness: Secondary | ICD-10-CM | POA: Diagnosis not present

## 2019-12-04 DIAGNOSIS — M25611 Stiffness of right shoulder, not elsewhere classified: Secondary | ICD-10-CM | POA: Diagnosis not present

## 2019-12-04 DIAGNOSIS — M7541 Impingement syndrome of right shoulder: Secondary | ICD-10-CM | POA: Diagnosis not present

## 2019-12-08 DIAGNOSIS — R531 Weakness: Secondary | ICD-10-CM | POA: Diagnosis not present

## 2019-12-08 DIAGNOSIS — M25611 Stiffness of right shoulder, not elsewhere classified: Secondary | ICD-10-CM | POA: Diagnosis not present

## 2019-12-08 DIAGNOSIS — M7541 Impingement syndrome of right shoulder: Secondary | ICD-10-CM | POA: Diagnosis not present

## 2019-12-11 DIAGNOSIS — M25611 Stiffness of right shoulder, not elsewhere classified: Secondary | ICD-10-CM | POA: Diagnosis not present

## 2019-12-11 DIAGNOSIS — M7541 Impingement syndrome of right shoulder: Secondary | ICD-10-CM | POA: Diagnosis not present

## 2019-12-11 DIAGNOSIS — R531 Weakness: Secondary | ICD-10-CM | POA: Diagnosis not present

## 2019-12-17 ENCOUNTER — Other Ambulatory Visit: Payer: Self-pay

## 2019-12-17 ENCOUNTER — Encounter: Payer: Self-pay | Admitting: Neurology

## 2019-12-17 ENCOUNTER — Ambulatory Visit (INDEPENDENT_AMBULATORY_CARE_PROVIDER_SITE_OTHER): Payer: Medicare Other | Admitting: Neurology

## 2019-12-17 ENCOUNTER — Encounter: Payer: Self-pay | Admitting: Adult Health

## 2019-12-17 ENCOUNTER — Ambulatory Visit: Payer: Medicare Other | Admitting: Adult Health

## 2019-12-17 VITALS — BP 143/83 | HR 87 | Temp 97.3°F | Ht 66.0 in | Wt 143.0 lb

## 2019-12-17 DIAGNOSIS — G9341 Metabolic encephalopathy: Secondary | ICD-10-CM

## 2019-12-17 DIAGNOSIS — F0151 Vascular dementia with behavioral disturbance: Secondary | ICD-10-CM | POA: Diagnosis not present

## 2019-12-17 DIAGNOSIS — R451 Restlessness and agitation: Secondary | ICD-10-CM

## 2019-12-17 DIAGNOSIS — F01518 Vascular dementia, unspecified severity, with other behavioral disturbance: Secondary | ICD-10-CM

## 2019-12-17 DIAGNOSIS — R410 Disorientation, unspecified: Secondary | ICD-10-CM

## 2019-12-17 MED ORDER — ALPRAZOLAM 0.5 MG PO TABS: 0.5000 mg | ORAL_TABLET | Freq: Every evening | ORAL | 0 refills | Status: DC | PRN

## 2019-12-17 MED ORDER — MEMANTINE HCL 5 MG PO TABS: 5.0000 mg | ORAL_TABLET | Freq: Two times a day (BID) | ORAL | 5 refills | Status: DC

## 2019-12-17 NOTE — Progress Notes (Signed)
Provider:  Larey Seat, M D  Referring Provider: Sharilyn Sites, MD Primary Care Physician:  Sharilyn Sites, MD  Chief Complaint  Patient presents with  . Follow-up    pt with son, she had a fall about a 1.5 week ago and she fell going to the bathroom. she is now walking with her walker.    12-17-1999: Claire West is a 79 y.o. female patient, seen with confusion, disorientation and agitation. She  Has good and bad days, she falls a bit less frequently as she now uses a walker. She is unsteady at night, and more agitated. More impatient . No visual hallucinations. She is holding on to the walls or furniture.  Here today with elevated BP, metoprolol and benicar.  She is constipated. No depression, good appetite.  MMSE today 19/ 30- visual spatial deficit- Lewy body? Has no visual hallucinations, appears worried. Anxious. We discussed xanax low dose - she did not ask for pain medicine.         10-28-2018 with caretaker and sister, having fallen many time. Her caretaker has noted that she falls after turning head or body- off balance. She falls over the walker, with the cane.  I was able today to see a photograph on I asked Claire West when she had fallen and she had quite a significant goose egg on her right forehead, bruising over the face, she seems to tend to fall straight forward.  I was also made aware that she had a previous admission to hospital on 15 November when she had suddenly felt hot she had been shopping during the day was in a department store as a family went home she developed slurred speech and seemed not quite oriented to her surroundings, her son called EMS and she was admitted to [facility] from there transferred to [facility] for an MRI.  She had a normal urine analysis is very little nitrate, I doubt that she had a UTI at the time.  She had hypertension and she was given hydralazine as her blood pressure exceeded 160/100.  She was continued  on Toprol and Diovan.  She was also given Lovenox as a DVT prophylaxis.  Again the patient was transferred after observation.  To [facility] where an MRI of the brain was obtained which showed thank goodness no acute strokes or intracranial bleed.  The neuro hospitalist Dr. Virgina Norfolk evaluated her up and at the time of his examination her extremities moved with normal strength and coordination bilateral pupils equally responsive, she had a symmetric smile he did not find any dysarthria.  Labs showed high triglycerides, overall cholesterol normal limits, she was also having a sodium level of 134 glucose level of 117 but she had not been fast.  White blood cell count was 6.0 no indication of infection.  CT of the head stable focal right frontal white matter hypodensity encephalomalacia, age-related involutional state changes.  No hypodensity, atherosclerosis of the sixth carotid siphons were seen.  Also atherosclerosis of both vertebral arteries noted.  MRI brain moderate intracranial atherosclerosis.  Severe stenosis of the left V4 and left P2 segments no emergent large vessel occlusion.  Chest x-ray no acute pulmonary process and mild cardiomegaly.     RV with son and caretaker CNA, 07-07-2018. I had the pleasure of seeing this patient on December 24, 2017 the day before her 45 first day.  See history below.  We held off after our last visit on  starting any kind of memory supportive medication as I was not sure that the patient has a ongoing chronic metabolic encephalopathy or rather a delirium but had something to do with the medication and conditions she was treated in the hospital for.  She also had acute electrolyte imbalances which can cause confusion memory loss and sometimes even pseudo-psychotic episodes.  Today she performed well on a Mini-Mental Status Examination she was fully oriented to [place] and state, she knew it was 68 September but not sure about the day of the week.   She was able to spell a word backwards which gave her 4 out of 5 possible points she recalled 3 immediate recall words, she follows all commands fluently and performed also verbal and written commands.  Visual-spatial orientation was intact. She has daytime caregivers, urine incontinence is a problem, and she forgets medications ,meals, but she is not longer in delirium. .   Inspite of this MMSE was 27/30 points today! Her husband is in memory care, her brother in law had frontal dementia, a sister in law with lymphoma.   She has undergone a knee replacement and has undergone exercises and PT concluded just last week , walks with a walker. She feels unsteady- She has a swollen left leg, with a hematoma under the surgical knee site.   MMSE - Mini Mental State Exam 12/17/2019 06/17/2019 07/07/2018 12/24/2017  Not completed: - (No Data) - -  Orientation to time 4 5 4 4   Orientation to Place 4 5 5 4   Registration 3 3 3 3   Attention/ Calculation 2 4 3 4   Recall 1 1 3 2   Language- name 2 objects 2 2 2 1   Language- repeat 1 1 1 1   Language- follow 3 step command 3 3 3 3   Language- read & follow direction 1 1 1 1   Write a sentence 1 1 1 1   Copy design 1 1 1  0  Total score 23 27 27 24                                                               28/ 30 - I used WORLD backwards instead of serial 7s.    07-07-2018 I added a MOCA  21/ 30 points- this is indicative of early dementia.     HRI :Consult for this 79 year old female patient who is seen here as a referral from Dr. Hilma Favors for follow up on a metabolic encephalopathy late January and early February 2019, and earlier an episode in November 2018. shehas a history of HTN, osteoarthritis and 2 strokes.  Neurology  I have the pleasure of seeing Claire West  on 24 December 1960, 79 year old Caucasian right-handed female patient of Dr. Hilma Favors presents after a hospital admission between January 30 and November 08, 2017 when she was diagnosed with acute  metabolic encephalopathy hyponatremia, hypokalemia.  It was assumed that blood pressure medications which she has taken for over a decade had caused the electrolyte imbalance.  There were also previous episodes of low potassium in late Ultram 2018 noticed.  I would like to "her current medications which include aspirin, Citrucel, multivitamins, calcium, tramadol, oxybutynin chloride, omeprazole, meloxicam, escitalopram, losartan, hydrochlorothiazide and metoprolol.  She no longer takes meloxicam, tramadol , oxybutynin.  She feels better but she still has sundowning, asks more repetitive questions to wards the evening, more forgetful.   She is often frustrated and her family is concerned about her driving. The patient's Blood pressure control is very important to slow the progression of neuro micro-vascular disease.  I will revisit with her in 4 month and offered in the meanwhile start her on Namenda , with the goal to start aricept as addition. She is at this time inclined to wait, have repeat memory testing and would than decide in 4 month to start medication or not.      Review of Systems: Out of a complete 14 system review, the patient complains of only the following symptoms, and all other reviewed systems are negative.  confusion, repetitive questions, walking unsteadiness. Cane - knee is "Bone on bone"   Social History   Socioeconomic History  . Marital status: Married    Spouse name: Not on file  . Number of children: Not on file  . Years of education: Not on file  . Highest education level: Not on file  Occupational History  . Not on file  Tobacco Use  . Smoking status: Never Smoker  . Smokeless tobacco: Never Used  Substance and Sexual Activity  . Alcohol use: Yes    Comment: occ  . Drug use: No  . Sexual activity: Never    Birth control/protection: Surgical  Other Topics Concern  . Not on file  Social History Narrative  . Not on file   Social Determinants of Health    Financial Resource Strain:   . Difficulty of Paying Living Expenses:   Food Insecurity:   . Worried About Charity fundraiser in the Last Year:   . Arboriculturist in the Last Year:   Transportation Needs:   . Film/video editor (Medical):   Marland Kitchen Lack of Transportation (Non-Medical):   Physical Activity:   . Days of Exercise per Week:   . Minutes of Exercise per Session:   Stress:   . Feeling of Stress :   Social Connections:   . Frequency of Communication with Friends and Family:   . Frequency of Social Gatherings with Friends and Family:   . Attends Religious Services:   . Active Member of Clubs or Organizations:   . Attends Archivist Meetings:   Marland Kitchen Marital Status:   Intimate Partner Violence:   . Fear of Current or Ex-Partner:   . Emotionally Abused:   Marland Kitchen Physically Abused:   . Sexually Abused:     Family History  Problem Relation Age of Onset  . Lung cancer Mother   . Lung cancer Sister     Past Medical History:  Diagnosis Date  . Arthritis   . Depression   . Gait instability    walks with cane  . GERD (gastroesophageal reflux disease)   . History of blood transfusion   . Hyperlipemia   . Hypertension   . Knee pain   . Metabolic encephalopathy   . Mild cognitive impairment with memory loss   . Seasonal allergies   . Skin cancer     Past Surgical History:  Procedure Laterality Date  . ABDOMINAL HYSTERECTOMY    . ARTHROSCOPIC REPAIR ACL Left   . COLONOSCOPY    . COLONOSCOPY N/A 11/23/2015   Procedure: COLONOSCOPY;  Surgeon: Rogene Houston, MD;  Location: [facility];  Service: Endoscopy;  Laterality: N/A;  830  . EYE SURGERY Bilateral  Cataracts  . SKIN CANCER EXCISION     Right shoulder  . TOTAL KNEE ARTHROPLASTY Left 05/05/2018   Procedure: LEFT TOTAL KNEE ARTHROPLASTY;  Surgeon: Gaynelle Arabian, MD;  Location: [facility];  Service: Orthopedics;  Laterality: Left;  with block  . TOTAL KNEE ARTHROPLASTY Right 06/22/2019   Procedure: TOTAL  KNEE ARTHROPLASTY;  Surgeon: Gaynelle Arabian, MD;  Location: [facility];  Service: Orthopedics;  Laterality: Right;  102min    Current Outpatient Medications  Medication Sig Dispense Refill  . acetaminophen (TYLENOL) 500 MG tablet Take 500 mg by mouth every 4 (four) hours as needed for mild pain.     Marland Kitchen escitalopram (LEXAPRO) 10 MG tablet Take 10 mg by mouth at bedtime.  1  . hydrochlorothiazide (HYDRODIURIL) 25 MG tablet Take 25 mg by mouth daily.    . hydrocortisone cream 1 % Apply 1 application topically 2 (two) times daily as needed for itching.    . metoprolol tartrate (LOPRESSOR) 50 MG tablet Take 50 mg by mouth 2 (two) times daily.    Marland Kitchen olmesartan (BENICAR) 20 MG tablet Take 20 mg by mouth 2 (two) times daily.    . Omega-3 Fatty Acids (OMEGA-3 FISH OIL PO) Take 1 tablet by mouth daily.    Marland Kitchen omeprazole (PRILOSEC) 20 MG capsule Take 20-40 mg by mouth See admin instructions. Alternates taking 40mg  daily with 20mg     . potassium chloride (K-DUR) 10 MEQ tablet Take 1 tablet (10 mEq total) by mouth daily. (Patient taking differently: Take 10 mEq by mouth daily with supper. ) 5 tablet 0  . pravastatin (PRAVACHOL) 20 MG tablet Take 20 mg by mouth at bedtime.     . triamcinolone cream (KENALOG) 0.1 % Apply 1 application topically 2 (two) times daily as needed (itching).     No current facility-administered medications for this visit.    Allergies as of 12/17/2019 - Review Complete 12/17/2019  Allergen Reaction Noted  . Penicillins Anaphylaxis, Swelling, and Rash 07/08/2012  . Adhesive [tape] Itching and Rash 08/22/2018  . Codeine Hives and Rash 07/08/2012  . Neosporin [neomycin-bacitracin zn-polymyx] Rash 04/23/2018  . Sulfa antibiotics Hives and Rash 07/08/2012     CLINICAL DATA:  Initial evaluation for acute altered mental status, confusion, lethargy.  EXAM: MRI HEAD WITHOUT CONTRAST  TECHNIQUE: Multiplanar, multiecho pulse sequences of the brain and surrounding structures were  obtained without intravenous contrast.  COMPARISON:  Prior CT from 11/04/2017.  FINDINGS: Brain: Generalized age-related cerebral atrophy. Extensive T2/FLAIR hyperintensity involving the periventricular and deep white matter both cerebral hemispheres, most consistent with chronic microvascular ischemic changes. Chronic microvascular disease present within the pons as well. Superimposed remote lacunar infarcts present within the bilateral basal ganglia, deep white matter, and right thalamus.  No abnormal foci of restricted diffusion to suggest acute or subacute ischemia. Gray-white matter differentiation maintained. No evidence for remote cortical infarction. No acute intracranial hemorrhage. Small chronic microhemorrhage noted within the right thalamus.  No mass lesion, midline shift or mass effect. No hydrocephalus. No extra-axial fluid collection. Major dural sinuses are grossly patent.  Pituitary gland suprasellar region normal. Midline structures intact and normal.  Vascular: Major intracranial vascular flow voids are maintained at the skull base.  Skull and upper cervical spine: Craniocervical junction within normal limits. Mild degenerate spondylolysis noted within the upper cervical spine without significant stenosis. Bone marrow signal intensity within normal limits. No scalp soft tissue abnormality.  Sinuses/Orbits: Globes and orbital soft tissues within normal limits. Patient status post cataract extraction bilaterally. Scattered  mucosal thickening within the ethmoidal air cells and maxillary sinuses. Paranasal sinuses are otherwise clear. No significant mastoid effusion. Inner ear structures grossly normal.  Other: None.  IMPRESSION: 1. No acute intracranial abnormality. 2. Generalized age-related cerebral atrophy with advanced chronic microvascular ischemic disease with scattered remote lacunar infarcts as above.   Electronically Signed   By:  Jeannine Boga M.D.   On: 11/06/2017 20:52    Vitals: BP (!) 143/83   Pulse 87   Temp (!) 97.3 F (36.3 C)   Ht 5\' 6"  (1.676 m)   Wt 143 lb (64.9 kg)   BMI 23.08 kg/m  Last Weight:  Wt Readings from Last 1 Encounters:  12/17/19 143 lb (64.9 kg)   Last Height:   Ht Readings from Last 1 Encounters:  12/17/19 5\' 6"  (1.676 m)    Physical exam:  General: The patient is not fully  alert and appears not in acute distress. The patient is well groomed. Head: Normocephalic, atraumatic. Neck is supple. Mallampati 1, neck circumference:14- Cardiovascular:  Regular rate and rhythm , without  murmurs or carotid bruit, and without distended neck veins. Respiratory: Lungs are clear to auscultation. Skin:  Without evidence of edema, or rash Trunk: stooped posture. Leaning to the right side.   Neurologic exam :   Memory subjective described as ' slow " There is a normal attention span & concentration ability-" small talk". Speech is fluent without dysarthria, dysphonia or aphasia. Mood and affect are appropriate.  MMSE - Mini Mental State Exam 12/17/2019 06/17/2019 07/07/2018  Not completed: - (No Data) -  Orientation to time 4 5 4   Orientation to Place 4 5 5   Registration 3 3 3   Attention/ Calculation 2 4 3   Recall 1 1 3   Language- name 2 objects 2 2 2   Language- repeat 1 1 1   Language- follow 3 step command 3 3 3   Language- read & follow direction 1 1 1   Write a sentence 1 1 1   Copy design 1 1 1   Total score 23 27 27    I added MOCA testing. The patient was well able to solve trail making and clock drawing, and named 2 of 3 exotic animals .She failed at copying a cube.  Cranial nerves: Pupils are equal and briskly reactive to light. Extraocular movements  in vertical and horizontal planes intact and without nystagmus.  Visual fields by finger perimetry are intact. Hearing to finger rub intact.  Facial sensation intact to fine touch. Facial motor strength is symmetric and tongue  and uvula move midline. Tongue protrusion into either cheek is normal. Shoulder shrug is normal.   Motor exam:   Normal tone ,muscle bulk and symmetric strength in all extremities. Her knee buckles- she is unsteady . But she has good strength. Both knee replaced.sicne initial visit. Bilateral good gripstrength   Sensory:  Fine touch vibration were absent in feet and reduced in both knees.  Proprioception was abnormal  Coordination: Rapid alternating movements in the fingers/hands were slowed   Finger-to-nose maneuver right sided dysmetria , no  tremor.  Gait and station: Patient walks with a cane for  assistive device. Strength within normal limits.  Smaller step width.  Stance is wider based - she turned with 5 steps and used her cane. No hand tremor noticed, normal arm swing.  I worked with the patient and held her right hand was evident that she is leaning towards the right side, and when she turned her turns were very fragmented she  is taking very small steps more than 4 return 180 degrees, I had her also turn her head left and right which did not lead to a fall but her gait slowed which tells me that she felt insecure or at least off balance and lightheaded. Deep tendon reflexes: in the  upper and lower extremities are symmetric 2 plus - and intact.  Babinski maneuver response is downgoing.    Assessment:  Dear Dr. Hilma Favors,  After physical and neurologic examination, review of laboratory studies, imaging, neurophysiology testing and pre-existing records, assessment of our mutual patient is  that of :   Advanced microvascular diease and arthrosclerosis, larger vessels such as carotid syphons and vertebral arteries were affected, explaining the fall tendency after a head turn.  Dementia by MMSE ( 23 /30 ) points. -visiospatial disorientation. Still reading newspaper, knowing the date   Gait disorder related to neuropathy, disturbed proprioception. advanced arthritis, knee replacement. She  got out of breath, too.    Plan:  Treatment plan and additional workup :  The variability of day to day cognitive function is remarkable, but she has reached dementia. This may be vascular dementia and some Alzheimer's , but it is not typical for Lewy body ( no visual  Hallucinations). I like for her to finally try Namenda, she has in the past declined. I think she can benefit.    Linton Ham Malon Branton MD 12/17/2019

## 2019-12-17 NOTE — Patient Instructions (Signed)
Memantine Tablets What is this medicine? MEMANTINE (MEM an teen) is used to treat dementia caused by Alzheimer's disease. This medicine may be used for other purposes; ask your health care provider or pharmacist if you have questions. COMMON BRAND NAME(S): Namenda What should I tell my health care provider before I take this medicine? They need to know if you have any of these conditions:  difficulty passing urine  kidney disease  liver disease  seizures  an unusual or allergic reaction to memantine, other medicines, foods, dyes, or preservatives  pregnant or trying to get pregnant  breast-feeding How should I use this medicine? Take this medicine by mouth with a glass of water. Follow the directions on the prescription label. You may take this medicine with or without food. Take your doses at regular intervals. Do not take your medicine more often than directed. Continue to take your medicine even if you feel better. Do not stop taking except on the advice of your doctor or health care professional. Talk to your pediatrician regarding the use of this medicine in children. Special care may be needed. Overdosage: If you think you have taken too much of this medicine contact a poison control center or emergency room at once. NOTE: This medicine is only for you. Do not share this medicine with others. What if I miss a dose? If you miss a dose, take it as soon as you can. If it is almost time for your next dose, take only that dose. Do not take double or extra doses. If you do not take your medicine for several days, contact your health care provider. Your dose may need to be changed. What may interact with this medicine?  acetazolamide  amantadine  cimetidine  dextromethorphan  dofetilide  hydrochlorothiazide  ketamine  metformin  methazolamide  quinidine  ranitidine  sodium bicarbonate  triamterene This list may not describe all possible interactions. Give your  health care provider a list of all the medicines, herbs, non-prescription drugs, or dietary supplements you use. Also tell them if you smoke, drink alcohol, or use illegal drugs. Some items may interact with your medicine. What should I watch for while using this medicine? Visit your doctor or health care professional for regular checks on your progress. Check with your doctor or health care professional if there is no improvement in your symptoms or if they get worse. You may get drowsy or dizzy. Do not drive, use machinery, or do anything that needs mental alertness until you know how this drug affects you. Do not stand or sit up quickly, especially if you are an older patient. This reduces the risk of dizzy or fainting spells. Alcohol can make you more drowsy and dizzy. Avoid alcoholic drinks. What side effects may I notice from receiving this medicine? Side effects that you should report to your doctor or health care professional as soon as possible:  allergic reactions like skin rash, itching or hives, swelling of the face, lips, or tongue  agitation or a feeling of restlessness  depressed mood  dizziness  hallucinations  redness, blistering, peeling or loosening of the skin, including inside the mouth  seizures  vomiting Side effects that usually do not require medical attention (report to your doctor or health care professional if they continue or are bothersome):  constipation  diarrhea  headache  nausea  trouble sleeping This list may not describe all possible side effects. Call your doctor for medical advice about side effects. You may report side effects  to FDA at 1-800-FDA-1088. Where should I keep my medicine? Keep out of the reach of children. Store at room temperature between 15 degrees and 30 degrees C (59 degrees and 86 degrees F). Throw away any unused medicine after the expiration date. NOTE: This sheet is a summary. It may not cover all possible information.  If you have questions about this medicine, talk to your doctor, pharmacist, or health care provider.  2020 Elsevier/Gold Standard (2013-07-13 14:10:42)

## 2019-12-18 DIAGNOSIS — E663 Overweight: Secondary | ICD-10-CM | POA: Diagnosis not present

## 2019-12-18 DIAGNOSIS — M1991 Primary osteoarthritis, unspecified site: Secondary | ICD-10-CM | POA: Diagnosis not present

## 2019-12-18 DIAGNOSIS — Z1389 Encounter for screening for other disorder: Secondary | ICD-10-CM | POA: Diagnosis not present

## 2019-12-18 DIAGNOSIS — B029 Zoster without complications: Secondary | ICD-10-CM | POA: Diagnosis not present

## 2019-12-18 DIAGNOSIS — Z6825 Body mass index (BMI) 25.0-25.9, adult: Secondary | ICD-10-CM | POA: Diagnosis not present

## 2019-12-23 ENCOUNTER — Telehealth: Payer: Self-pay | Admitting: Neurology

## 2019-12-23 NOTE — Telephone Encounter (Signed)
Called the patient's caregiver back. She states that last thur she started the namenda and then on fri she went to see her PCP due to developing shingles on the right side of her body and right breast. The PCP treated with valtrex 1 gram and prednisone dose pack.  Since all this happened at the same time she is unsure what is causing the symptoms but she is concerned for the patient. Her memory and confusion is worsening almost like hallucinations. She is doing weird things that don't make sense. She states that she has developed blurred vision and shuffling feet when she walks. She is barely communicating with her today and sleeping a lot more then normal.  I discussed all of this information with Dr Brett Fairy as I don't feel that these symptoms are coming from the namenda. I questioned that it could be from the prednisone. Pt care giver states that she has taken prednisone and not had a reaction like this. Dr Dohmeier agrees that she doesn't think it is related to the namenda and possibly could be from the valtrex.  The patient takes last pill of prednisone on 3/18 and 3/19 completed the valtrex course. At this time I advised the caretaker per Dr Edwena Felty instructions to hold the namenda for now until she gets over the shingles. Advised to complete her medication course as directed and once she is off of everything continue to assess. Advised she should also bring this information to the PCP attention. Advised that once she is back to baseline she can contact us and we can discuss resuming the namenda. She verbalized understanding. Given the patient's history of TIA's I advised to also continue to monitor her symptoms and if she continues to decline or show stroke like symptoms to call 911.

## 2019-12-23 NOTE — Telephone Encounter (Signed)
Debbie kellum called stating pt is having blurred vision,confusion,can hardly walk and is getting worse and believes it could be due to the namenda

## 2019-12-23 NOTE — Telephone Encounter (Signed)
Claire West(friend on DPR) has called stating the day after pt saw Dr Brett Fairy pt went to her pcp and was told she has shingles. Claire West is asking if the medications pt was given for shingles could be causing the blurred vision and hallucinations pt has been having.  Please call

## 2019-12-25 ENCOUNTER — Emergency Department (HOSPITAL_COMMUNITY): Payer: Medicare Other

## 2019-12-25 ENCOUNTER — Other Ambulatory Visit: Payer: Self-pay

## 2019-12-25 ENCOUNTER — Inpatient Hospital Stay (HOSPITAL_COMMUNITY): Payer: Medicare Other

## 2019-12-25 ENCOUNTER — Encounter (HOSPITAL_COMMUNITY): Payer: Self-pay | Admitting: Emergency Medicine

## 2019-12-25 ENCOUNTER — Inpatient Hospital Stay (HOSPITAL_COMMUNITY)
Admission: EM | Admit: 2019-12-25 | Discharge: 2019-12-28 | DRG: 065 | Disposition: A | Discharge status: Skilled Nursing Facility | Payer: Medicare Other | Attending: Internal Medicine | Admitting: Internal Medicine

## 2019-12-25 DIAGNOSIS — Z96653 Presence of artificial knee joint, bilateral: Secondary | ICD-10-CM | POA: Diagnosis present

## 2019-12-25 DIAGNOSIS — K219 Gastro-esophageal reflux disease without esophagitis: Secondary | ICD-10-CM | POA: Diagnosis present

## 2019-12-25 DIAGNOSIS — R2689 Other abnormalities of gait and mobility: Secondary | ICD-10-CM | POA: Diagnosis present

## 2019-12-25 DIAGNOSIS — I69398 Other sequelae of cerebral infarction: Secondary | ICD-10-CM | POA: Diagnosis not present

## 2019-12-25 DIAGNOSIS — Z885 Allergy status to narcotic agent status: Secondary | ICD-10-CM | POA: Diagnosis not present

## 2019-12-25 DIAGNOSIS — H5347 Heteronymous bilateral field defects: Secondary | ICD-10-CM | POA: Diagnosis present

## 2019-12-25 DIAGNOSIS — Z91048 Other nonmedicinal substance allergy status: Secondary | ICD-10-CM | POA: Diagnosis not present

## 2019-12-25 DIAGNOSIS — H547 Unspecified visual loss: Secondary | ICD-10-CM | POA: Diagnosis present

## 2019-12-25 DIAGNOSIS — F329 Major depressive disorder, single episode, unspecified: Secondary | ICD-10-CM | POA: Diagnosis present

## 2019-12-25 DIAGNOSIS — M179 Osteoarthritis of knee, unspecified: Secondary | ICD-10-CM | POA: Diagnosis not present

## 2019-12-25 DIAGNOSIS — J302 Other seasonal allergic rhinitis: Secondary | ICD-10-CM | POA: Diagnosis present

## 2019-12-25 DIAGNOSIS — I700 Atherosclerosis of aorta: Secondary | ICD-10-CM | POA: Diagnosis present

## 2019-12-25 DIAGNOSIS — E785 Hyperlipidemia, unspecified: Secondary | ICD-10-CM | POA: Diagnosis present

## 2019-12-25 DIAGNOSIS — E782 Mixed hyperlipidemia: Secondary | ICD-10-CM | POA: Diagnosis not present

## 2019-12-25 DIAGNOSIS — R404 Transient alteration of awareness: Secondary | ICD-10-CM | POA: Diagnosis not present

## 2019-12-25 DIAGNOSIS — B029 Zoster without complications: Secondary | ICD-10-CM | POA: Diagnosis present

## 2019-12-25 DIAGNOSIS — Z85828 Personal history of other malignant neoplasm of skin: Secondary | ICD-10-CM | POA: Diagnosis not present

## 2019-12-25 DIAGNOSIS — M6281 Muscle weakness (generalized): Secondary | ICD-10-CM | POA: Diagnosis not present

## 2019-12-25 DIAGNOSIS — I63531 Cerebral infarction due to unspecified occlusion or stenosis of right posterior cerebral artery: Secondary | ICD-10-CM | POA: Diagnosis present

## 2019-12-25 DIAGNOSIS — I10 Essential (primary) hypertension: Secondary | ICD-10-CM | POA: Diagnosis present

## 2019-12-25 DIAGNOSIS — Z9181 History of falling: Secondary | ICD-10-CM | POA: Diagnosis not present

## 2019-12-25 DIAGNOSIS — Z79899 Other long term (current) drug therapy: Secondary | ICD-10-CM

## 2019-12-25 DIAGNOSIS — Z880 Allergy status to penicillin: Secondary | ICD-10-CM

## 2019-12-25 DIAGNOSIS — F0150 Vascular dementia without behavioral disturbance: Secondary | ICD-10-CM | POA: Diagnosis present

## 2019-12-25 DIAGNOSIS — Z7401 Bed confinement status: Secondary | ICD-10-CM | POA: Diagnosis not present

## 2019-12-25 DIAGNOSIS — I639 Cerebral infarction, unspecified: Secondary | ICD-10-CM | POA: Diagnosis present

## 2019-12-25 DIAGNOSIS — I351 Nonrheumatic aortic (valve) insufficiency: Secondary | ICD-10-CM | POA: Diagnosis not present

## 2019-12-25 DIAGNOSIS — Z801 Family history of malignant neoplasm of trachea, bronchus and lung: Secondary | ICD-10-CM

## 2019-12-25 DIAGNOSIS — R410 Disorientation, unspecified: Secondary | ICD-10-CM | POA: Diagnosis not present

## 2019-12-25 DIAGNOSIS — J9811 Atelectasis: Secondary | ICD-10-CM | POA: Diagnosis present

## 2019-12-25 DIAGNOSIS — Z03818 Encounter for observation for suspected exposure to other biological agents ruled out: Secondary | ICD-10-CM | POA: Diagnosis not present

## 2019-12-25 DIAGNOSIS — I773 Arterial fibromuscular dysplasia: Secondary | ICD-10-CM | POA: Diagnosis present

## 2019-12-25 DIAGNOSIS — Z20822 Contact with and (suspected) exposure to covid-19: Secondary | ICD-10-CM | POA: Diagnosis present

## 2019-12-25 DIAGNOSIS — I6523 Occlusion and stenosis of bilateral carotid arteries: Secondary | ICD-10-CM | POA: Diagnosis not present

## 2019-12-25 DIAGNOSIS — Z882 Allergy status to sulfonamides status: Secondary | ICD-10-CM

## 2019-12-25 DIAGNOSIS — H539 Unspecified visual disturbance: Secondary | ICD-10-CM | POA: Diagnosis not present

## 2019-12-25 DIAGNOSIS — I69318 Other symptoms and signs involving cognitive functions following cerebral infarction: Secondary | ICD-10-CM | POA: Diagnosis not present

## 2019-12-25 DIAGNOSIS — F419 Anxiety disorder, unspecified: Secondary | ICD-10-CM | POA: Diagnosis not present

## 2019-12-25 DIAGNOSIS — H269 Unspecified cataract: Secondary | ICD-10-CM | POA: Diagnosis present

## 2019-12-25 DIAGNOSIS — Z8673 Personal history of transient ischemic attack (TIA), and cerebral infarction without residual deficits: Secondary | ICD-10-CM

## 2019-12-25 DIAGNOSIS — Z7982 Long term (current) use of aspirin: Secondary | ICD-10-CM | POA: Diagnosis not present

## 2019-12-25 DIAGNOSIS — Z888 Allergy status to other drugs, medicaments and biological substances status: Secondary | ICD-10-CM

## 2019-12-25 DIAGNOSIS — R531 Weakness: Secondary | ICD-10-CM | POA: Diagnosis present

## 2019-12-25 DIAGNOSIS — F29 Unspecified psychosis not due to a substance or known physiological condition: Secondary | ICD-10-CM | POA: Diagnosis not present

## 2019-12-25 DIAGNOSIS — G3184 Mild cognitive impairment, so stated: Secondary | ICD-10-CM | POA: Diagnosis not present

## 2019-12-25 DIAGNOSIS — I6622 Occlusion and stenosis of left posterior cerebral artery: Secondary | ICD-10-CM | POA: Diagnosis not present

## 2019-12-25 DIAGNOSIS — R278 Other lack of coordination: Secondary | ICD-10-CM | POA: Diagnosis not present

## 2019-12-25 DIAGNOSIS — M2550 Pain in unspecified joint: Secondary | ICD-10-CM | POA: Diagnosis not present

## 2019-12-25 DIAGNOSIS — R4182 Altered mental status, unspecified: Secondary | ICD-10-CM | POA: Diagnosis not present

## 2019-12-25 DIAGNOSIS — F0390 Unspecified dementia without behavioral disturbance: Secondary | ICD-10-CM | POA: Diagnosis not present

## 2019-12-25 HISTORY — DX: Cerebral infarction, unspecified: I63.9

## 2019-12-25 LAB — CBC WITH DIFFERENTIAL/PLATELET
Abs Immature Granulocytes: 0.17 10*3/uL — ABNORMAL HIGH (ref 0.00–0.07)
Basophils Absolute: 0 10*3/uL (ref 0.0–0.1)
Basophils Relative: 1 %
Eosinophils Absolute: 0.2 10*3/uL (ref 0.0–0.5)
Eosinophils Relative: 2 %
HCT: 40.2 % (ref 36.0–46.0)
Hemoglobin: 13.8 g/dL (ref 12.0–15.0)
Immature Granulocytes: 2 %
Lymphocytes Relative: 17 %
Lymphs Abs: 1.5 10*3/uL (ref 0.7–4.0)
MCH: 31.9 pg (ref 26.0–34.0)
MCHC: 34.3 g/dL (ref 30.0–36.0)
MCV: 93.1 fL (ref 80.0–100.0)
Monocytes Absolute: 1.3 10*3/uL — ABNORMAL HIGH (ref 0.1–1.0)
Monocytes Relative: 15 %
Neutro Abs: 5.4 10*3/uL (ref 1.7–7.7)
Neutrophils Relative %: 63 %
Platelets: 255 10*3/uL (ref 150–400)
RBC: 4.32 MIL/uL (ref 3.87–5.11)
RDW: 12.9 % (ref 11.5–15.5)
WBC: 8.5 10*3/uL (ref 4.0–10.5)
nRBC: 0 % (ref 0.0–0.2)

## 2019-12-25 LAB — COMPREHENSIVE METABOLIC PANEL
ALT: 22 U/L (ref 0–44)
AST: 22 U/L (ref 15–41)
Albumin: 3.6 g/dL (ref 3.5–5.0)
Alkaline Phosphatase: 56 U/L (ref 38–126)
Anion gap: 9 (ref 5–15)
BUN: 10 mg/dL (ref 8–23)
CO2: 29 mmol/L (ref 22–32)
Calcium: 9 mg/dL (ref 8.9–10.3)
Chloride: 96 mmol/L — ABNORMAL LOW (ref 98–111)
Creatinine, Ser: 0.61 mg/dL (ref 0.44–1.00)
GFR calc Af Amer: >60 mL/min (ref >60)
GFR calc non Af Amer: >60 mL/min (ref >60)
Glucose, Bld: 99 mg/dL (ref 70–99)
Potassium: 3.9 mmol/L (ref 3.5–5.1)
Sodium: 134 mmol/L — ABNORMAL LOW (ref 135–145)
Total Bilirubin: 0.8 mg/dL (ref 0.3–1.2)
Total Protein: 6.6 g/dL (ref 6.5–8.1)

## 2019-12-25 LAB — BRAIN NATRIURETIC PEPTIDE: B Natriuretic Peptide: 65 pg/mL (ref 0.0–100.0)

## 2019-12-25 LAB — LIPASE, BLOOD: Lipase: 20 U/L (ref 11–51)

## 2019-12-25 MED ORDER — SODIUM CHLORIDE 0.9 % IV BOLUS
500.0000 mL | Freq: Once | INTRAVENOUS | Status: AC
  Administered 2019-12-25: 500 mL via INTRAVENOUS

## 2019-12-25 MED ORDER — IOHEXOL 350 MG/ML SOLN
100.0000 mL | Freq: Once | INTRAVENOUS | Status: AC | PRN
  Administered 2019-12-25: 100 mL via INTRAVENOUS

## 2019-12-25 MED ORDER — SODIUM CHLORIDE 0.9 % IV SOLN: INTRAVENOUS | Status: DC

## 2019-12-25 NOTE — ED Provider Notes (Signed)
[facility] EMERGENCY DEPARTMENT Provider Note   CSN: [id number] Arrival date & time: 12/25/19  1126     History Chief Complaint  Patient presents with  . Altered Mental Status    Claire West is a 79 y.o. female.  Patient known to have a history of dementia.  On medications for followed by [facility] neurology.  Also known to have metabolic encephalopathy in the past hypertension hyperlipidemia.  Family has noticed significant change in her in the last few days.  She does have an outbreak of herpes zoster for which she is on antivirals for.  Prior to that she had an adjustment on one of her the many meds.  But they had a stopped that because interfered with the Valtrex that she is taking.  But she has not improved at all there seems to be some visual problems.  And altered mental status worse than her normal dementia.  Patient afebrile here oxygen sats 97%.  Blood pressure running on the high side 186/78 heart rate 71 respirations 11.  Patient alert and will answer questions but family states more confused than usual.  Difficult to completely assess her visual acuity.        Past Medical History:  Diagnosis Date  . Arthritis   . Depression   . Gait instability    walks with cane  . GERD (gastroesophageal reflux disease)   . History of blood transfusion   . Hyperlipemia   . Hypertension   . Knee pain   . Metabolic encephalopathy   . Mild cognitive impairment with memory loss   . Seasonal allergies   . Skin cancer     Patient Active Problem List   Diagnosis Date Noted  . Restlessness and agitation 12/17/2019  . Vascular dementia with behavior disturbance ([place]) 12/17/2019  . Mild cognitive impairment 06/25/2019  . Depression 06/25/2019  . Falls frequently 10/28/2018  . TIA (transient ischemic attack) 08/23/2018  . Delirium in remission 07/07/2018  . OA (osteoarthritis) of knee 05/05/2018  . Amnestic MCI (mild cognitive impairment with memory loss) 12/24/2017  .  Acute metabolic encephalopathy [id number]  . Altered mental status 11/06/2017  . Hyponatremia 02/12/2016  . Hypokalemia 02/12/2016  . Dehydration 02/12/2016  . Elevated LFTs 02/12/2016  . HTN (hypertension) 02/12/2016  . GERD (gastroesophageal reflux disease) 02/12/2016    Past Surgical History:  Procedure Laterality Date  . ABDOMINAL HYSTERECTOMY    . ARTHROSCOPIC REPAIR ACL Left   . COLONOSCOPY    . COLONOSCOPY N/A 11/23/2015   Procedure: COLONOSCOPY;  Surgeon: Rogene Houston, MD;  Location: [facility] SUITE;  Service: Endoscopy;  Laterality: N/A;  830  . EYE SURGERY Bilateral    Cataracts  . SKIN CANCER EXCISION     Right shoulder  . TOTAL KNEE ARTHROPLASTY Left 05/05/2018   Procedure: LEFT TOTAL KNEE ARTHROPLASTY;  Surgeon: Gaynelle Arabian, MD;  Location: [facility] ORS;  Service: Orthopedics;  Laterality: Left;  with block  . TOTAL KNEE ARTHROPLASTY Right 06/22/2019   Procedure: TOTAL KNEE ARTHROPLASTY;  Surgeon: Gaynelle Arabian, MD;  Location: [facility] ORS;  Service: Orthopedics;  Laterality: Right;  12min     OB History    Gravida  1   Para  1   Term  1   Preterm      AB      Living  1     SAB      TAB      Ectopic      Multiple  Live Births              Family History  Problem Relation Age of Onset  . Lung cancer Mother   . Lung cancer Sister     Social History   Tobacco Use  . Smoking status: Never Smoker  . Smokeless tobacco: Never Used  Substance Use Topics  . Alcohol use: Yes    Comment: occ  . Drug use: No    Home Medications Prior to Admission medications   Medication Sig Start Date End Date Taking? Authorizing Provider  acetaminophen (TYLENOL) 500 MG tablet Take 500 mg by mouth every 4 (four) hours as needed for mild pain.     [provider]  ALPRAZolam Duanne Moron) 0.5 MG tablet Take 1 tablet (0.5 mg total) by mouth at bedtime as needed for anxiety. 12/17/19   Dohmeier, Asencion Partridge, MD  escitalopram (LEXAPRO) 10 MG tablet Take 20 mg by mouth  at bedtime. 03/07/18   [provider]  hydrochlorothiazide (HYDRODIURIL) 25 MG tablet Take 25 mg by mouth daily. 10/05/19   [provider]  hydrocortisone cream 1 % Apply 1 application topically 2 (two) times daily as needed for itching.    [provider]  memantine (NAMENDA) 5 MG tablet Take 1 tablet (5 mg total) by mouth 2 (two) times daily. 12/17/19   Dohmeier, Asencion Partridge, MD  metoprolol tartrate (LOPRESSOR) 50 MG tablet Take 50 mg by mouth 2 (two) times daily. 11/21/19   [provider]  olmesartan (BENICAR) 20 MG tablet Take 20 mg by mouth 2 (two) times daily. 11/24/19   [provider]  Omega-3 Fatty Acids (OMEGA-3 FISH OIL PO) Take 1 tablet by mouth daily.    [provider]  omeprazole (PRILOSEC) 20 MG capsule Take 20-40 mg by mouth See admin instructions. Alternates taking 40mg  daily with 20mg  04/18/15   [provider]  potassium chloride (K-DUR) 10 MEQ tablet Take 1 tablet (10 mEq total) by mouth daily. Patient taking differently: Take 10 mEq by mouth daily with supper.  11/04/17   Noemi Chapel, MD  pravastatin (PRAVACHOL) 20 MG tablet Take 20 mg by mouth at bedtime.  03/10/19   [provider]  triamcinolone cream (KENALOG) 0.1 % Apply 1 application topically 2 (two) times daily as needed (itching).    [provider]    Allergies    Penicillins, Adhesive [tape], Codeine, Neosporin [neomycin-bacitracin zn-polymyx], and Sulfa antibiotics  Review of Systems   Review of Systems  Unable to perform ROS: Dementia    Physical Exam Updated Vital Signs BP (!) 180/106   Pulse 73   Temp 99.7 F (37.6 C) (Oral)   Resp 19   Ht 1.676 m (5\' 6" )   Wt 64.9 kg   SpO2 100%   BMI 23.08 kg/m   Physical Exam Vitals and nursing note reviewed.  Constitutional:      General: She is not in acute distress.    Appearance: Normal appearance. She is well-developed.  HENT:     Head: Normocephalic and atraumatic.  Eyes:      Extraocular Movements: Extraocular movements intact.     Conjunctiva/sclera: Conjunctivae normal.     Pupils: Pupils are equal, round, and reactive to light.  Cardiovascular:     Rate and Rhythm: Normal rate and regular rhythm.     Heart sounds: No murmur.  Pulmonary:     Effort: Pulmonary effort is normal. No respiratory distress.     Breath sounds: Normal breath sounds.  Abdominal:  Palpations: Abdomen is soft.     Tenderness: There is no abdominal tenderness.  Musculoskeletal:        General: No swelling. Normal range of motion.     Cervical back: Normal range of motion and neck supple.  Skin:    General: Skin is warm and dry.     Capillary Refill: Capillary refill takes less than 2 seconds.     Findings: Rash present.     Comments: Consistent with zoster right side.  Neurological:     Mental Status: She is alert.     Comments: Patient alert but confused.  Moving all extremities     ED Results / Procedures / Treatments   Labs (all labs ordered are listed, but only abnormal results are displayed) Labs Reviewed  CBC WITH DIFFERENTIAL/PLATELET - Abnormal; Notable for the following components:      Result Value   Monocytes Absolute 1.3 (*)    Abs Immature Granulocytes 0.17 (*)    All other components within normal limits  COMPREHENSIVE METABOLIC PANEL - Abnormal; Notable for the following components:   Sodium 134 (*)    Chloride 96 (*)    All other components within normal limits  BRAIN NATRIURETIC PEPTIDE  LIPASE, BLOOD  URINALYSIS, ROUTINE W REFLEX MICROSCOPIC    EKG EKG Interpretation  Date/Time:  Friday December 25 2019 11:34:42 EDT Ventricular Rate:  71 PR Interval:    QRS Duration: 90 QT Interval:  395 QTC Calculation: 430 R Axis:   -18 Text Interpretation: Sinus rhythm Borderline left axis deviation Consider anterior infarct Minimal ST depression, inferior leads Confirmed by Fredia Sorrow [id number]) on 12/25/2019 2:00:12 PM   Radiology CT Head Wo  Contrast  Result Date: 12/25/2019 CLINICAL DATA:  Altered mental status. EXAM: CT HEAD WITHOUT CONTRAST TECHNIQUE: Contiguous axial images were obtained from the base of the skull through the vertex without intravenous contrast. COMPARISON:  June 24, 2019. FINDINGS: Brain: Mild chronic ischemic white matter disease is noted. New right occipital low density is noted most consistent with acute infarction. Ventricular size is within normal limits. No mass effect or midline shift is noted. There is no evidence of hemorrhage or mass lesion. Vascular: No hyperdense vessel or unexpected calcification. Skull: Normal. Negative for fracture or focal lesion. Sinuses/Orbits: No acute finding. Other: None. IMPRESSION: New right occipital low density is noted most consistent with acute infarction. MRI is recommended for further evaluation. Electronically Signed   By: Marijo Conception M.D.   On: 12/25/2019 14:04   DG Chest Port 1 View  Result Date: 12/25/2019 CLINICAL DATA:  Altered mental status.  Hypertension. EXAM: PORTABLE CHEST 1 VIEW COMPARISON:  June 24, 2019 FINDINGS: There is slight atelectasis in the left base. The lungs elsewhere are clear. Heart size and pulmonary vascularity are normal. No adenopathy. There is aortic atherosclerosis. There are breast implants bilaterally. There is midthoracic levoscoliosis. IMPRESSION: Slight left base atelectasis. Lungs elsewhere clear. Stable cardiac silhouette. No adenopathy. Breast implants bilaterally. Aortic Atherosclerosis (ICD10-I70.0). Electronically Signed   By: Lowella Grip III M.D.   On: 12/25/2019 13:19    Procedures Procedures (including critical care time)  Medications Ordered in ED Medications  0.9 %  sodium chloride infusion ( Intravenous New Bag/Given 12/25/19 1329)  sodium chloride 0.9 % bolus 500 mL (500 mLs Intravenous New Bag/Given 12/25/19 1329)    ED Course  I have reviewed the triage vital signs and the nursing  notes.  Pertinent labs & imaging results that were available during my  care of the patient were reviewed by me and considered in my medical decision making (see chart for details).    MDM Rules/Calculators/A&P                     Work-up for the change in the mental status  Patient's head CT raise concern for acute stroke.  MRI was ordered.  Rest of work-up without any significant abnormalities.  Urinalysis still pending.  No leukocytosis.  No significant electrolyte changes chest x-ray negative for pneumonia.  If MRI confirms stroke will require admission.  Final Clinical Impression(s) / ED Diagnoses Final diagnoses:  Herpes zoster without complication  Cerebrovascular accident (CVA), unspecified mechanism ([place])    Rx / [place] Orders ED Discharge Orders    None       Fredia Sorrow, MD 12/25/19 1600

## 2019-12-25 NOTE — ED Triage Notes (Signed)
Patient brought from home by [facility] and reports ALT mental status with HX of dementia.  She was taking Namenda and valtrex and it increased her AMS. The doctor stopped namenda on Monday but she is still altered.

## 2019-12-25 NOTE — ED Notes (Signed)
Report given to [facility]. 

## 2019-12-25 NOTE — Plan of Care (Signed)
New admission

## 2019-12-25 NOTE — H&P (Addendum)
History and Physical    Claire West [id number] DOB: 1940/12/01 DOA: 12/25/2019  PCP: Sharilyn Sites, MD   Patient coming from: Home  I have personally briefly reviewed patient's old medical records in [place]  Chief Complaint: Altered mental status  HPI: Claire West is a 79 y.o. female with medical history significant for vascular dementia, mild cognitive impairments, hypertension, depression.  Patient presented to the ED via EMS with reports of altered mental status over the past several days.  Patient has been repeating the same statements and questions over and over and patient is paranoid.  Patient also reports that over the past 3 days he feels that patient has some weakness in her left upper extremity.  He also reports vision change, as he has had to get very close to the patient for her to see.   Patient saw her neurologist 12/17/2019 with similar complaints of confusion disorientation and agitation.  Patient also had hep B zoster rash to her right trunk.  She was started on Namenda and also given a prescription for also given a prescription for antiviral-Valtrex . With persistent confusion Namenda was discontinued, patient was to complete her last dose of antiviral today. On my evaluation, patient is awake and alert, answers simple questions appropriately, but unable to give me details. Patient has 24-hour daily assistance from an aide.  Ambulates with a walker at baseline.  Home medication lists 81 mg aspirin daily patient son thinks patient is compliant with this.   ED Course: Blood pressure systolic [id number] to [id number], temperature 99.7.  Unremarkable CBC, BNP.  Portable chest x-ray shows slight left base atelectasis.  Head CT was suggestive of acute infarct MRI was recommended.  Subsequent MRI brain MRA head showed an acute infarct in the right PCA territory with involvement of the parieto-occipital brain, right hippocampos and small focus of right lateral thalamus.  MRA  markedly degraded by motion. Hospitalist to admit for further evaluation and management.   Review of Systems: As per HPI all other systems reviewed and negative.  Past Medical History:  Diagnosis Date  . Arthritis   . Depression   . Gait instability    walks with cane  . GERD (gastroesophageal reflux disease)   . History of blood transfusion   . Hyperlipemia   . Hypertension   . Knee pain   . Metabolic encephalopathy   . Mild cognitive impairment with memory loss   . Seasonal allergies   . Skin cancer     Past Surgical History:  Procedure Laterality Date  . ABDOMINAL HYSTERECTOMY    . ARTHROSCOPIC REPAIR ACL Left   . COLONOSCOPY    . COLONOSCOPY N/A 11/23/2015   Procedure: COLONOSCOPY;  Surgeon: Rogene Houston, MD;  Location: [facility];  Service: Endoscopy;  Laterality: N/A;  830  . EYE SURGERY Bilateral    Cataracts  . SKIN CANCER EXCISION     Right shoulder  . TOTAL KNEE ARTHROPLASTY Left 05/05/2018   Procedure: LEFT TOTAL KNEE ARTHROPLASTY;  Surgeon: Gaynelle Arabian, MD;  Location: [facility];  Service: Orthopedics;  Laterality: Left;  with block  . TOTAL KNEE ARTHROPLASTY Right 06/22/2019   Procedure: TOTAL KNEE ARTHROPLASTY;  Surgeon: Gaynelle Arabian, MD;  Location: [facility];  Service: Orthopedics;  Laterality: Right;  21min     reports that she has never smoked. She has never used smokeless tobacco. She reports current alcohol use. She reports that she does not use drugs.  Allergies  Allergen Reactions  .  Penicillins Anaphylaxis, Swelling and Rash    Oral rash/peeling Did it involve swelling of the face/tongue/throat, SOB, or low BP? Yes Did it involve sudden or severe rash/hives, skin peeling, or any reaction on the inside of your mouth or nose? Yes Did you need to seek medical attention at a hospital or doctor's office? Yes When did it last happen? Approximately 2017 If all above answers are "NO", may proceed with cephalosporin use. **Has tolerated Rocephin,  Vantin since 2017**    . Adhesive [Tape] Itching and Rash    Irritation at site  . Codeine Hives and Rash  . Neosporin [Neomycin-Bacitracin Zn-Polymyx] Rash  . Sulfa Antibiotics Hives and Rash    Family History  Problem Relation Age of Onset  . Lung cancer Mother   . Lung cancer Sister     Prior to Admission medications   Medication Sig Start Date End Date Taking? Authorizing Provider  acetaminophen (TYLENOL) 500 MG tablet Take 500 mg by mouth every 4 (four) hours as needed for mild pain.    Yes [provider]  ALPRAZolam Duanne Moron) 0.5 MG tablet Take 1 tablet (0.5 mg total) by mouth at bedtime as needed for anxiety. 12/17/19  Yes Dohmeier, Asencion Partridge, MD  aspirin 81 MG EC tablet Take 81 mg by mouth daily. Swallow whole.   Yes [provider]  escitalopram (LEXAPRO) 10 MG tablet Take 20 mg by mouth at bedtime. 03/07/18  Yes [provider]  hydrochlorothiazide (HYDRODIURIL) 25 MG tablet Take 25 mg by mouth daily. 10/05/19  Yes [provider]  hydrocortisone cream 1 % Apply 1 application topically 2 (two) times daily as needed for itching.   Yes [provider]  Javier Docker Oil 500 MG CAPS Take 500 mg by mouth daily at 12 noon.   Yes [provider]  memantine (NAMENDA) 5 MG tablet Take 1 tablet (5 mg total) by mouth 2 (two) times daily. 12/17/19  Yes Dohmeier, Asencion Partridge, MD  metoprolol tartrate (LOPRESSOR) 50 MG tablet Take 50 mg by mouth 2 (two) times daily. 11/21/19  Yes [provider]  Multiple Vitamin (MULTIVITAMIN ADULT PO) Take 1 tablet by mouth daily at 12 noon.   Yes [provider]  olmesartan (BENICAR) 20 MG tablet Take 20 mg by mouth 2 (two) times daily. 11/24/19  Yes [provider]  Omega-3 Fatty Acids (OMEGA-3 FISH OIL PO) Take 1 tablet by mouth daily.   Yes [provider]  omeprazole (PRILOSEC) 20 MG capsule Take 20-40 mg by mouth See admin instructions. Alternates taking 40mg  daily with 20mg  04/18/15   Yes [provider]  polyethylene glycol powder (CLEARLAX) 17 GM/SCOOP powder Take 1 Container by mouth once.   Yes [provider]  potassium chloride (K-DUR) 10 MEQ tablet Take 1 tablet (10 mEq total) by mouth daily. Patient taking differently: Take 10 mEq by mouth daily with supper.  11/04/17  Yes Noemi Chapel, MD  pravastatin (PRAVACHOL) 20 MG tablet Take 20 mg by mouth at bedtime.  03/10/19  Yes [provider]  triamcinolone cream (KENALOG) 0.1 % Apply 1 application topically 2 (two) times daily as needed (itching).   Yes [provider]    Physical Exam: Vitals:   12/25/19 1430 12/25/19 1445 12/25/19 1500 12/25/19 1900  BP: (!) 171/100  (!) 188/98 (!) 186/92  Pulse:    89  Resp: 18 13 16  (!) 23  Temp:      TempSrc:      SpO2:    98%  Weight:      Height:        Constitutional: NAD, calm, comfortable Vitals:   12/25/19 1430 12/25/19 1445 12/25/19 1500 12/25/19 1900  BP: (!) 171/100  (!) 188/98 (!) 186/92  Pulse:    89  Resp: 18 13 16  (!) 23  Temp:      TempSrc:      SpO2:    98%  Weight:      Height:       Eyes: PERRL, lids and conjunctivae normal ENMT: Mucous membranes are moist.  Neck: normal, supple, no masses, no thyromegaly Respiratory: Normal respiratory effort. No accessory muscle use.  Cardiovascular:  No extremity edema. 2+ pedal pulses. No carotid bruits.  Abdomen: no tenderness, no masses palpated. No hepatosplenomegaly. Bowel sounds positive.  Musculoskeletal: no clubbing / cyanosis. No joint deformity upper and lower extremities. Good ROM, no contractures. Normal muscle tone.  Skin: Erythematous petechia rash to right trunk extending posteriorly toward the vertebra midline, well-healed, crusted over, no ulcers ulcers. No induration Neurologic: Exam limited by patient's mental status, but it appears vision appears impaired in the right eye-unable to finger count, unable to check peripheral vision, no facial asymmetry,  speech normal, normal tongue movement, normal strength shoulder shrug, 5/5 strength bilateral upper extremity, 5/5 strength left lower extremity, 4/5 strength right lower extremity.  Tone normal.  Sensation intact globally.  Unable to check peripheral vision-patient confused. Psychiatric:  Alert and oriented x 2, able to tell me her name, and where she is, but tells me its fall, not oriented to situation.  Labs on Admission: I have personally reviewed following labs and imaging studies  CBC: Recent Labs  Lab 12/25/19 1302  WBC 8.5  NEUTROABS 5.4  HGB 13.8  HCT 40.2  MCV 93.1  PLT 123456   Basic Metabolic Panel: Recent Labs  Lab 12/25/19 1302  NA 134*  K 3.9  CL 96*  CO2 29  GLUCOSE 99  BUN 10  CREATININE 0.61  CALCIUM 9.0   Liver Function Tests: Recent Labs  Lab 12/25/19 1302  AST 22  ALT 22  ALKPHOS 56  BILITOT 0.8  PROT 6.6  ALBUMIN 3.6   Recent Labs  Lab 12/25/19 1302  LIPASE 20    Radiological Exams on Admission: CT Head Wo Contrast  Result Date: 12/25/2019 CLINICAL DATA:  Altered mental status. EXAM: CT HEAD WITHOUT CONTRAST TECHNIQUE: Contiguous axial images were obtained from the base of the skull through the vertex without intravenous contrast. COMPARISON:  June 24, 2019. FINDINGS: Brain: Mild chronic ischemic white matter disease is noted. New right occipital low density is noted most consistent with acute infarction. Ventricular size is within normal limits. No mass effect or midline shift is noted. There is no evidence of hemorrhage or mass lesion. Vascular: No hyperdense vessel or unexpected calcification. Skull: Normal. Negative for fracture or focal lesion. Sinuses/Orbits: No acute finding. Other: None. IMPRESSION: New right occipital low density is noted most consistent with acute infarction. MRI is recommended for further evaluation. Electronically Signed   By: Marijo Conception M.D.   On: 12/25/2019 14:04   MR ANGIO HEAD WO CONTRAST  Result Date:  12/25/2019 CLINICAL DATA:  Altered mental status. Dementia. New right posterior brain stroke seen by CT. EXAM: MRI HEAD WITHOUT CONTRAST MRA HEAD WITHOUT CONTRAST TECHNIQUE: Multiplanar, multiecho pulse sequences of the brain and surrounding structures were obtained without intravenous contrast. Angiographic images of the head were obtained using MRA technique without contrast. COMPARISON:  Head CT  earlier same day.  MRI 06/25/2019. FINDINGS: MRI HEAD FINDINGS Brain: Acute infarction in the right posterior cerebral artery territory affecting the parieto-occipital brain, the right hippocampus, and with punctate involvement of the right lateral thalamus. Mild swelling but no gross hemorrhage. No other vascular distribution insult is identified acutely. Elsewhere, there chronic small-vessel ischemic changes of the pons. Old small vessel cerebellar insults. Cerebral hemispheres otherwise show patchy and confluent chronic small vessel disease throughout the hemispheric white matter and old small vessel infarction in the right thalamus and the basal ganglia. No mass, hemorrhage, hydrocephalus or extra-axial collection. Vascular: Major vessels at the base of the brain show flow. Skull and upper cervical spine: Negative Sinuses/Orbits: Clear/normal Other: None MRA HEAD FINDINGS The study suffers from motion degradation. Both internal carotid arteries show flow into the brain. Both anterior and middle cerebral arteries show flow. Diminutive A1 segment on the right with the anterior cerebral arteries receiving most of there supply from the left carotid circulation. Both vertebral arteries show antegrade flow to the basilar. The basilar artery shows flow. Flow is present within the proximal superior cerebellar and posterior cerebral arteries. Distal vessel evaluation is not possible because of motion degradation. IMPRESSION: Acute infarction in the right PCA territory with involvement of the parieto-occipital brain, right  hippocampus and small focus of the right lateral thalamus. Mild swelling but no evidence of hemorrhage. Extensive chronic ischemic changes elsewhere throughout the brain as outlined above. Major vessels at the base of the brain show flow. The MR angiogram is markedly degraded by motion. There is flow at least in the proximal aspect of the right PCA. The distal branch vessels are not evaluated because of motion degradation Electronically Signed   By: Nelson Chimes M.D.   On: 12/25/2019 16:18   MR Brain Wo Contrast (neuro protocol)  Result Date: 12/25/2019 CLINICAL DATA:  Altered mental status. Dementia. New right posterior brain stroke seen by CT. EXAM: MRI HEAD WITHOUT CONTRAST MRA HEAD WITHOUT CONTRAST TECHNIQUE: Multiplanar, multiecho pulse sequences of the brain and surrounding structures were obtained without intravenous contrast. Angiographic images of the head were obtained using MRA technique without contrast. COMPARISON:  Head CT earlier same day.  MRI 06/25/2019. FINDINGS: MRI HEAD FINDINGS Brain: Acute infarction in the right posterior cerebral artery territory affecting the parieto-occipital brain, the right hippocampus, and with punctate involvement of the right lateral thalamus. Mild swelling but no gross hemorrhage. No other vascular distribution insult is identified acutely. Elsewhere, there chronic small-vessel ischemic changes of the pons. Old small vessel cerebellar insults. Cerebral hemispheres otherwise show patchy and confluent chronic small vessel disease throughout the hemispheric white matter and old small vessel infarction in the right thalamus and the basal ganglia. No mass, hemorrhage, hydrocephalus or extra-axial collection. Vascular: Major vessels at the base of the brain show flow. Skull and upper cervical spine: Negative Sinuses/Orbits: Clear/normal Other: None MRA HEAD FINDINGS The study suffers from motion degradation. Both internal carotid arteries show flow into the brain. Both  anterior and middle cerebral arteries show flow. Diminutive A1 segment on the right with the anterior cerebral arteries receiving most of there supply from the left carotid circulation. Both vertebral arteries show antegrade flow to the basilar. The basilar artery shows flow. Flow is present within the proximal superior cerebellar and posterior cerebral arteries. Distal vessel evaluation is not possible because of motion degradation. IMPRESSION: Acute infarction in the right PCA territory with involvement of the parieto-occipital brain, right hippocampus and small focus of the right lateral thalamus.  Mild swelling but no evidence of hemorrhage. Extensive chronic ischemic changes elsewhere throughout the brain as outlined above. Major vessels at the base of the brain show flow. The MR angiogram is markedly degraded by motion. There is flow at least in the proximal aspect of the right PCA. The distal branch vessels are not evaluated because of motion degradation Electronically Signed   By: Nelson Chimes M.D.   On: 12/25/2019 16:18   DG Chest Port 1 View  Result Date: 12/25/2019 CLINICAL DATA:  Altered mental status.  Hypertension. EXAM: PORTABLE CHEST 1 VIEW COMPARISON:  June 24, 2019 FINDINGS: There is slight atelectasis in the left base. The lungs elsewhere are clear. Heart size and pulmonary vascularity are normal. No adenopathy. There is aortic atherosclerosis. There are breast implants bilaterally. There is midthoracic levoscoliosis. IMPRESSION: Slight left base atelectasis. Lungs elsewhere clear. Stable cardiac silhouette. No adenopathy. Breast implants bilaterally. Aortic Atherosclerosis (ICD10-I70.0). Electronically Signed   By: Lowella Grip III M.D.   On: 12/25/2019 13:19    EKG: Independently reviewed.  Sinus rhythm, QTC 430.  No significant ST-T wave changes compared to prior.  Assessment/Plan Active Problems:   Acute CVA (cerebrovascular accident) ([place])  Acute cerebrovascular  accident-presenting with altered mental status, in the setting of herpes zoster infection. MRI brain shows acute infarct in right PCA with involvement of parietal-occipital brain right hippocampus and small focus of right lateral thalamus.  Son reports left upper extremity weakness, vision changes. Objectively she appears to have- visual defects involving right eye, and right lower extremity weakness.  -Ad[facility] to [facility] to complete stroke work-up, neurology not available at [facility] -Echocardiogram -Carotid Dopplers -Lipid panel,HgbA1c in a.m. -PT OT speech therapy evaluation -Continue aspirin 81 mg daily -Switch home pravastatin to atorvastatin 40 mg daily -Talked to neurology at [facility], Dr. Lorraine Lax, as MRA head is motion degraded, recommended CTA head and neck-ordered.  Also recommended continuing with just home aspirin for now.  Pending additional imaging may need more aggressive work-up.  Depression, history of vascular dementia, mild cognitive impairment -Resume home Xanax 0.5 mg as needed at bedtime, resume home Lexapro  Hypertension-160s to 180s.  Stroke symptoms have been present for several days. - resume home metoprolol and olmesartan, HCTZ.  Recent Herpes Zoster infection -Complete last dose of Valtrex tonight.   DVT prophylaxis: Heparin Code Status: Full code Family Communication: Patient's son Sandi Mariscal is healthcare power of attorney present at bedside. Disposition Plan: 1 to 2 days, completion of stroke work-up.  Likely discharge to home, but follow-up PT evaluation. Consults called: Neurology Admission status: Inpatient, telemetry I certify that at the point of admission it is my clinical judgment that the patient will require inpatient hospital care spanning beyond 2 midnights from the point of admission due to high intensity of service, high risk for further deterioration and high frequency of surveillance required. The following factors support the patient  status of inpatient: Acute stroke on MRI.   Bethena Roys MD [facility]  12/25/2019, 9:50 PM

## 2019-12-25 NOTE — ED Notes (Signed)
Pt was informed that we need a urine sample. 

## 2019-12-26 ENCOUNTER — Inpatient Hospital Stay (HOSPITAL_COMMUNITY): Payer: Medicare Other

## 2019-12-26 DIAGNOSIS — E782 Mixed hyperlipidemia: Secondary | ICD-10-CM

## 2019-12-26 DIAGNOSIS — H539 Unspecified visual disturbance: Secondary | ICD-10-CM

## 2019-12-26 DIAGNOSIS — I10 Essential (primary) hypertension: Secondary | ICD-10-CM

## 2019-12-26 DIAGNOSIS — I639 Cerebral infarction, unspecified: Secondary | ICD-10-CM

## 2019-12-26 DIAGNOSIS — F0390 Unspecified dementia without behavioral disturbance: Secondary | ICD-10-CM

## 2019-12-26 LAB — URINALYSIS, COMPLETE (UACMP) WITH MICROSCOPIC
Bilirubin Urine: NEGATIVE
Glucose, UA: NEGATIVE mg/dL
Hgb urine dipstick: NEGATIVE
Ketones, ur: NEGATIVE mg/dL
Leukocytes,Ua: NEGATIVE
Nitrite: NEGATIVE
Protein, ur: NEGATIVE mg/dL
Specific Gravity, Urine: 1.044 — ABNORMAL HIGH (ref 1.005–1.030)
pH: 8 (ref 5.0–8.0)

## 2019-12-26 LAB — SARS CORONAVIRUS 2 (TAT 6-24 HRS): SARS Coronavirus 2: NEGATIVE

## 2019-12-26 LAB — HEMOGLOBIN A1C
Hgb A1c MFr Bld: 5.3 % (ref 4.8–5.6)
Mean Plasma Glucose: 105.41 mg/dL

## 2019-12-26 LAB — LIPID PANEL
Cholesterol: 172 mg/dL (ref 0–200)
HDL: 48 mg/dL (ref >40)
LDL Cholesterol: 105 mg/dL — ABNORMAL HIGH (ref 0–99)
Total CHOL/HDL Ratio: 3.6 RATIO
Triglycerides: 95 mg/dL (ref <150)
VLDL: 19 mg/dL (ref 0–40)

## 2019-12-26 MED ORDER — ACETAMINOPHEN 160 MG/5ML PO SOLN: 650.0000 mg | ORAL | Status: DC | PRN

## 2019-12-26 MED ORDER — ASPIRIN EC 81 MG PO TBEC: 81.0000 mg | DELAYED_RELEASE_TABLET | Freq: Every day | ORAL | Status: DC

## 2019-12-26 MED ORDER — PRAVASTATIN SODIUM 10 MG PO TABS
20.0000 mg | ORAL_TABLET | Freq: Every day | ORAL | Status: DC
  Administered 2019-12-26: 20 mg via ORAL
  Filled 2019-12-26: qty 2

## 2019-12-26 MED ORDER — STROKE: EARLY STAGES OF RECOVERY BOOK
Freq: Once | Status: AC
  Filled 2019-12-26: qty 1

## 2019-12-26 MED ORDER — CLOPIDOGREL BISULFATE 75 MG PO TABS
75.0000 mg | ORAL_TABLET | Freq: Every day | ORAL | Status: DC
  Administered 2019-12-26 – 2019-12-28 (×3): 75 mg via ORAL
  Filled 2019-12-26 (×4): qty 1

## 2019-12-26 MED ORDER — HEPARIN SODIUM (PORCINE) 5000 UNIT/ML IJ SOLN
5000.0000 [IU] | Freq: Three times a day (TID) | INTRAMUSCULAR | Status: DC
  Administered 2019-12-26 – 2019-12-28 (×8): 5000 [IU] via SUBCUTANEOUS
  Filled 2019-12-26 (×8): qty 1

## 2019-12-26 MED ORDER — HYDROCHLOROTHIAZIDE 25 MG PO TABS
25.0000 mg | ORAL_TABLET | Freq: Every day | ORAL | Status: DC
  Administered 2019-12-26 – 2019-12-28 (×3): 25 mg via ORAL
  Filled 2019-12-26 (×3): qty 1

## 2019-12-26 MED ORDER — ASPIRIN EC 325 MG PO TBEC
325.0000 mg | DELAYED_RELEASE_TABLET | Freq: Every day | ORAL | Status: DC
  Administered 2019-12-26 – 2019-12-28 (×3): 325 mg via ORAL
  Filled 2019-12-26 (×3): qty 1

## 2019-12-26 MED ORDER — ACETAMINOPHEN 325 MG PO TABS
650.0000 mg | ORAL_TABLET | ORAL | Status: DC | PRN
  Administered 2019-12-26 – 2019-12-27 (×2): 650 mg via ORAL
  Filled 2019-12-26 (×2): qty 2

## 2019-12-26 MED ORDER — ESCITALOPRAM OXALATE 10 MG PO TABS
20.0000 mg | ORAL_TABLET | Freq: Every day | ORAL | Status: DC
  Administered 2019-12-26 – 2019-12-27 (×2): 20 mg via ORAL
  Filled 2019-12-26 (×2): qty 2

## 2019-12-26 MED ORDER — IRBESARTAN 150 MG PO TABS
150.0000 mg | ORAL_TABLET | Freq: Every day | ORAL | Status: DC
  Administered 2019-12-26 – 2019-12-28 (×3): 150 mg via ORAL
  Filled 2019-12-26 (×3): qty 1

## 2019-12-26 MED ORDER — METOPROLOL TARTRATE 50 MG PO TABS
50.0000 mg | ORAL_TABLET | Freq: Two times a day (BID) | ORAL | Status: DC
  Administered 2019-12-26 – 2019-12-28 (×5): 50 mg via ORAL
  Filled 2019-12-26 (×5): qty 1

## 2019-12-26 MED ORDER — SENNOSIDES-DOCUSATE SODIUM 8.6-50 MG PO TABS: 1.0000 | ORAL_TABLET | Freq: Every evening | ORAL | Status: DC | PRN

## 2019-12-26 MED ORDER — ACETAMINOPHEN 650 MG RE SUPP: 650.0000 mg | RECTAL | Status: DC | PRN

## 2019-12-26 NOTE — Plan of Care (Signed)
  Problem: Education: Goal: Knowledge of General Education information will improve Description: Including pain rating scale, medication(s)/side effects and non-pharmacologic comfort measures 12/26/2019 1629 by Camillia Herter, RN Outcome: Progressing 12/26/2019 0729 by Camillia Herter, RN Outcome: Progressing   Problem: Health Behavior/Discharge Planning: Goal: Ability to manage health-related needs will improve 12/26/2019 1629 by Camillia Herter, RN Outcome: Progressing 12/26/2019 0729 by Camillia Herter, RN Outcome: Progressing   Problem: Clinical Measurements: Goal: Ability to maintain clinical measurements within normal limits will improve 12/26/2019 1629 by Camillia Herter, RN Outcome: Progressing 12/26/2019 0729 by Camillia Herter, RN Outcome: Progressing Goal: Will remain free from infection 12/26/2019 1629 by Camillia Herter, RN Outcome: Progressing 12/26/2019 0729 by Camillia Herter, RN Outcome: Progressing Goal: Diagnostic test results will improve 12/26/2019 1629 by Camillia Herter, RN Outcome: Progressing 12/26/2019 0729 by Camillia Herter, RN Outcome: Progressing Goal: Respiratory complications will improve 12/26/2019 1629 by Camillia Herter, RN Outcome: Progressing 12/26/2019 0729 by Camillia Herter, RN Outcome: Progressing Goal: Cardiovascular complication will be avoided 12/26/2019 1629 by Camillia Herter, RN Outcome: Progressing 12/26/2019 0729 by Camillia Herter, RN Outcome: Progressing   Problem: Activity: Goal: Risk for activity intolerance will decrease 12/26/2019 1629 by Camillia Herter, RN Outcome: Progressing 12/26/2019 0729 by Camillia Herter, RN Outcome: Progressing   Problem: Nutrition: Goal: Adequate nutrition will be maintained 12/26/2019 1629 by Camillia Herter, RN Outcome: Progressing 12/26/2019 0729 by Camillia Herter, RN Outcome: Progressing   Problem: Coping: Goal: Level of anxiety will decrease 12/26/2019 1629 by Camillia Herter, RN Outcome:  Progressing 12/26/2019 0729 by Camillia Herter, RN Outcome: Progressing   Problem: Elimination: Goal: Will not experience complications related to bowel motility 12/26/2019 1629 by Camillia Herter, RN Outcome: Progressing 12/26/2019 0729 by Camillia Herter, RN Outcome: Progressing Goal: Will not experience complications related to urinary retention 12/26/2019 1629 by Camillia Herter, RN Outcome: Progressing 12/26/2019 0729 by Camillia Herter, RN Outcome: Progressing   Problem: Pain Managment: Goal: General experience of comfort will improve 12/26/2019 1629 by Camillia Herter, RN Outcome: Progressing 12/26/2019 0729 by Camillia Herter, RN Outcome: Progressing   Problem: Safety: Goal: Ability to remain free from injury will improve 12/26/2019 1629 by Camillia Herter, RN Outcome: Progressing 12/26/2019 0729 by Camillia Herter, RN Outcome: Progressing   Problem: Skin Integrity: Goal: Risk for impaired skin integrity will decrease 12/26/2019 1629 by Camillia Herter, RN Outcome: Progressing 12/26/2019 0729 by Camillia Herter, RN Outcome: Progressing

## 2019-12-26 NOTE — Plan of Care (Signed)

## 2019-12-26 NOTE — Progress Notes (Signed)
PROGRESS NOTE  Claire West [id number] DOB: [birth year] DOA: 12/25/2019 PCP: Sharilyn Sites, MD  Brief History   The patient is a 79 yr old woman who presented to [facility] ED from home on 3/19/2021with complaints of perseveration, altered mental status, and weakness in the left upper extremity. There is also some change in the patient's vision with an apparent decrease in her ability to see things at a distance. The patient saw her neurologist on 12/17/2019 with complaints of confusion, disorientation, and agitation. She was also diagnosed with a zoster eruption on her right trunk. She was started on Valtrex for that, and she completed therapy on 12/25/2019. She was also started on Namenda on that visit.   The patient has a medical history significant for vascular dementia, mild cognitive impairment, hypertension, and depression. [place] the patient lives at home with the assistance of an aide 24 hours a day. She ambulates with a walker at baseline. She is on ASA 81 mg daily at baseline.  In the ED at [facility] the patient was found to be hypertensive with a low grade temp of 99.7. CBC and BNP were unremarkable. CXR demonstrated a left base atelectasis. CT hea suggested acute infarct of the right occiput. The patient then underwent MRI and MRA. The MRI/MRA demonstrated an acute infarct in the right PCA territory with involvement of the parieto-occipital brain, right hippocampos and small focus of right lateral thalamus.  MRA markedly degraded by motion.  The patient was transferred to [facility] for admission for further evaluation and treatment. Lipid panel demonstrates LDL of 105. Hemoglobin A1c was 5.3. Thus far the patient has undergone CTA head and neck which demonstrated  1. Short segment occlusion of the distal P2 segment of the right PCA. 2. Moderate stenosis of the proximal P2 segment of the left PCA. 3. Moderate stenosis of the proximal left V4 segment of the posterior cerebral artery. 4.  Bilateral cervical internal carotid artery fibromuscular Dysplasia.  Echocardiogram, PT/OT eval are still pending.  Consultants  . Neurology  Procedures  . none  Antibiotics   Anti-infectives (From admission, onward)   None    .  Subjective  The patient is resting comfortably. No new complaints. Son is at bedside.  Objective   Vitals:  Vitals:   12/26/19 0900 12/26/19 1128  BP: (!) 148/92 (!) 154/88  Pulse: 88 84  Resp: 18 20  Temp: 98.4 F (36.9 C) 98.4 F (36.9 C)  SpO2: 97% 98%  Exam:  Constitutional:  . The patient is awake and alert. She is somewhat confused. No acute distress Eyes:  . pupils and irises appear normal . Normal lids and conjunctivae . Vision remains impaired. Respiratory:  . No increased work of breathing. . No wheezes, rales, or rhonchi . No tactile fremitus Cardiovascular:  . Regular rate and rhythm . No murmurs, ectopy, or gallups. . No lateral PMI. No thrills. Abdomen:  . Abdomen is soft, non-tender, non-distended . No hernias, masses, or organomegaly . Normoactive bowel sounds.  Musculoskeletal:  . No cyanosis, clubbing, or edema Skin:  . No rashes, lesions, ulcers . palpation of skin: no induration or nodules Neurologic:  . CN 2-12 intact . Sensation all 4 extremities intact . Right lower extremity weakness . Visual deficits in right eye Psychiatric:  . Confused  I have personally reviewed the following:   Today's Data  . Vitals, CMP, Lipid Panel, CBC  Cardiology Data  . EKG: NSR . Echocardiogram: Pending  Scheduled Meds: . aspirin  EC  325 mg Oral Daily  . clopidogrel  75 mg Oral Daily  . escitalopram  20 mg Oral QHS  . heparin  5,000 Units Subcutaneous Q8H  . hydrochlorothiazide  25 mg Oral Daily  . irbesartan  150 mg Oral Daily  . metoprolol tartrate  50 mg Oral BID  . pravastatin  20 mg Oral QHS   Continuous Infusions:  Active Problems:   Acute CVA (cerebrovascular accident) ([place])   LOS: 1 day   A  & P   Acute cerebrovascular accident-presenting with altered mental status, in the setting of herpes zoster infection. MRI brain shows acute infarct in right PCA with involvement of parietal-occipital brain right hippocampus and small focus of right lateral thalamus.  Son reports left upper extremity weakness, vision changes. Objectively she appears to have- visual defects involving right eye, and right lower extremity weakness. CTA head and neck have been completed and demonstrated: 1. Short segment occlusion of the distal P2 segment of the right PCA. 2. Moderate stenosis of the proximal P2 segment of the left PCA. 3. Moderate stenosis of the proximal left V4 segment of the posterior cerebral artery. 4. Bilateral cervical internal carotid artery fibromuscular Dysplasia. Carotid Dopplers are pending as are echocardiogram and PT/OT/SLP eval. Neurology has been consulted. Lipid panel demonstrates an LDL of 105. HbgA1c was 5.3. She has been continued on aspirin and plavix.   Depression, history of vascular dementia, mild cognitive impairment: Resume home Xanax 0.5 mg as needed at bedtime, resume home Lexapro.  Hypertension: 160s to 180s.  Stroke symptoms have been present for several days.Resume home metoprolol and olmesartan, HCTZ.  Recent Herpes Zoster infection: Course of Valtrex was completed on 12/25/2019.  I have seen and examined this patient myself. I have spent 35 minutes in her evaluation and care.  DVT prophylaxis: Heparin Code Status: Full code Family Communication: Patient's son Sandi Mariscal is healthcare power of attorney present at bedside. Disposition Plan: 1 to 2 days, completion of stroke work-up. Determination of disposition pending PT/OT eval, although the patient does have 24 hours help at home.  Zakiyyah Savannah, DO [facility] Direct contact: see [email or web address]  7PM-7AM contact night coverage as above 12/26/2019, 3:00 PM  LOS: 1 day

## 2019-12-26 NOTE — Consult Note (Signed)
Requesting Physician: Dr. Rosalva Ferron    Chief Complaint: Blurry vision, confusion and left-sided weakness  History obtained from: Patient and Chart    HPI:                                                                                                                                       Claire West is a 79 y.o. female with past medical history significant for vascular dementia, hypertension, depression presented to [facility] emergency department via EMS after altered mental status for several days,  Last known normal unclear, per chart review patient was taken to ED for confusion over the last several days was seen by her neurologist on 3/11 for confusion disorientation.  Noted to herpes zoster rash in the right trunk and was given prescription for Valtrex.  Patient also complains of visual blurriness and left leg weakness.  Work-up in [facility], ED included MRI brain which demonstrated right PCA territory infarction.  Patient was transferred to [facility] due to inability to lay off neurologist over the weekend for further stroke work-up  Patient is a poor historian history obtained by chart review.    Past Medical History:  Diagnosis Date  . Arthritis   . Depression   . Gait instability    walks with cane  . GERD (gastroesophageal reflux disease)   . History of blood transfusion   . Hyperlipemia   . Hypertension   . Knee pain   . Metabolic encephalopathy   . Mild cognitive impairment with memory loss   . Seasonal allergies   . Skin cancer     Past Surgical History:  Procedure Laterality Date  . ABDOMINAL HYSTERECTOMY    . ARTHROSCOPIC REPAIR ACL Left   . COLONOSCOPY    . COLONOSCOPY N/A 11/23/2015   Procedure: COLONOSCOPY;  Surgeon: Rogene Houston, MD;  Location: [facility];  Service: Endoscopy;  Laterality: N/A;  830  . EYE SURGERY Bilateral    Cataracts  . SKIN CANCER EXCISION     Right shoulder  . TOTAL KNEE ARTHROPLASTY Left 05/05/2018   Procedure: LEFT TOTAL  KNEE ARTHROPLASTY;  Surgeon: Gaynelle Arabian, MD;  Location: [facility] ORS;  Service: Orthopedics;  Laterality: Left;  with block  . TOTAL KNEE ARTHROPLASTY Right 06/22/2019   Procedure: TOTAL KNEE ARTHROPLASTY;  Surgeon: Gaynelle Arabian, MD;  Location: [facility] ORS;  Service: Orthopedics;  Laterality: Right;  34min    Family History  Problem Relation Age of Onset  . Lung cancer Mother   . Lung cancer Sister    Social History:  reports that she has never smoked. She has never used smokeless tobacco. She reports current alcohol use. She reports that she does not use drugs.  Allergies:  Allergies  Allergen Reactions  . Penicillins Anaphylaxis, Swelling and Rash    Oral rash/peeling Did it involve swelling of the face/tongue/throat, SOB, or low BP? Yes Did it  involve sudden or severe rash/hives, skin peeling, or any reaction on the inside of your mouth or nose? Yes Did you need to seek medical attention at a hospital or doctor's office? Yes When did it last happen? Approximately 2017 If all above answers are "NO", may proceed with cephalosporin use. **Has tolerated Rocephin, Vantin since 2017**    . Adhesive [Tape] Itching and Rash    Irritation at site  . Codeine Hives and Rash  . Neosporin [Neomycin-Bacitracin Zn-Polymyx] Rash  . Sulfa Antibiotics Hives and Rash    Medications:                                                                                                                        I reviewed home medications   ROS:                                                                                                                                     Limited review due to patient's altered mental status   Examination:                                                                                                      General: Appears well-developed  Psych: ?  Visual hallucinations Eyes: No scleral injection HENT: No OP obstrucion Head: Normocephalic.  Cardiovascular:  Normal rate and regular rhythm.  Respiratory: Effort normal and breath sounds normal to anterior ascultation GI: Soft.  No distension. There is no tenderness.  Skin: WDI    Neurological Examination Mental Status: Alert, oriented to month, not year or place.  Speech fluent without evidence of aphasia. Able to follow 3 step commands without difficulty. Cranial Nerves: II: Visual fields : Left homonymous hemianopsia III,IV, VI: ptosis not present, extra-ocular motions intact bilaterally, pupils equal, round, reactive to light and accommodation V,VII: smile symmetric, facial light touch sensation normal bilaterally VIII: hearing normal bilaterally IX,X: uvula rises symmetrically XI: bilateral shoulder shrug XII: midline tongue extension Motor: Right : Upper  extremity   5/5    Left:     Upper extremity   5/5  Lower extremity   5/5     Lower extremity   4/5 Tone and bulk:normal tone throughout; no atrophy noted Sensory: Neglect on double simultaneous stimulation in the left arm and leg Plantars: Right: downgoing   Left: downgoing Cerebellar: normal finger-to-nose Gait: Not assessed due to safety     Lab Results: Basic Metabolic Panel: Recent Labs  Lab 12/25/19 1302  NA 134*  K 3.9  CL 96*  CO2 29  GLUCOSE 99  BUN 10  CREATININE 0.61  CALCIUM 9.0    CBC: Recent Labs  Lab 12/25/19 1302  WBC 8.5  NEUTROABS 5.4  HGB 13.8  HCT 40.2  MCV 93.1  PLT 255    Coagulation Studies: No results for input(s): LABPROT, INR in the last 72 hours.  Imaging: CT ANGIO HEAD W OR WO CONTRAST  Result Date: 12/26/2019 CLINICAL DATA:  Vision change and altered mental status EXAM: CT ANGIOGRAPHY HEAD AND NECK TECHNIQUE: Multidetector CT imaging of the head and neck was performed using the standard protocol during bolus administration of intravenous contrast. Multiplanar CT image reconstructions and MIPs were obtained to evaluate the vascular anatomy. Carotid stenosis measurements (when  applicable) are obtained utilizing NASCET criteria, using the distal internal carotid diameter as the denominator. CONTRAST:  187mL OMNIPAQUE IOHEXOL 350 MG/ML SOLN COMPARISON:  None. FINDINGS: CT HEAD FINDINGS Brain: There is no mass, hemorrhage or extra-axial collection. The size and configuration of the ventricles and extra-axial CSF spaces are normal. Unchanged appearance of right occipital lobe infarct. There is hypoattenuation of the periventricular white matter, most commonly indicating chronic ischemic microangiopathy. Skull: The visualized skull base, calvarium and extracranial soft tissues are normal. Sinuses/Orbits: No fluid levels or advanced mucosal thickening of the visualized paranasal sinuses. No mastoid or middle ear effusion. The orbits are normal. CTA NECK FINDINGS SKELETON: There is no bony spinal canal stenosis. No lytic or blastic lesion. OTHER NECK: Normal pharynx, larynx and major salivary glands. No cervical lymphadenopathy. Unremarkable thyroid gland. UPPER CHEST: No pneumothorax or pleural effusion. No nodules or masses. AORTIC ARCH: There is mild calcific atherosclerosis of the aortic arch. There is no aneurysm, dissection or hemodynamically significant stenosis of the visualized portion of the aorta. Conventional 3 vessel aortic branching pattern. The visualized proximal subclavian arteries are widely patent. RIGHT CAROTID SYSTEM: Multifocal undulation. No stenosis. No dissection or occlusion. LEFT CAROTID SYSTEM: As on the contralateral side, there is an area of undulation. No atherosclerosis, occlusion or stenosis. VERTEBRAL ARTERIES: Left dominant configuration. Both origins are clearly patent. There is no dissection, occlusion or flow-limiting stenosis to the skull base (V1-V3 segments). CTA HEAD FINDINGS POSTERIOR CIRCULATION: --Vertebral arteries: Moderate stenosis of the proximal left V4 segment. Normal right. --Posterior inferior cerebellar arteries (PICA): Patent origins from  the vertebral arteries. --Anterior inferior cerebellar arteries (AICA): Patent origins from the basilar artery. --Basilar artery: Normal. --Superior cerebellar arteries: Normal. --Posterior cerebral arteries: Short segment occlusion of the distal P2 segment of the right PCA. Moderate stenosis of the proximal P2 segment of the left PCA. ANTERIOR CIRCULATION: --Intracranial internal carotid arteries: Normal. --Anterior cerebral arteries (ACA): Normal. Absent right A1 segment, normal variant --Middle cerebral arteries (MCA): Normal. VENOUS SINUSES: As permitted by contrast timing, patent. ANATOMIC VARIANTS: None Review of the MIP images confirms the above findings. IMPRESSION: 1. Short segment occlusion of the distal P2 segment of the right PCA. 2. Moderate stenosis of the proximal P2  segment of the left PCA. 3. Moderate stenosis of the proximal left V4 segment of the posterior cerebral artery. 4. Bilateral cervical internal carotid artery fibromuscular dysplasia. Electronically Signed   By: Ulyses Jarred M.D.   On: 12/26/2019 00:12   CT Head Wo Contrast  Result Date: 12/25/2019 CLINICAL DATA:  Altered mental status. EXAM: CT HEAD WITHOUT CONTRAST TECHNIQUE: Contiguous axial images were obtained from the base of the skull through the vertex without intravenous contrast. COMPARISON:  June 24, 2019. FINDINGS: Brain: Mild chronic ischemic white matter disease is noted. New right occipital low density is noted most consistent with acute infarction. Ventricular size is within normal limits. No mass effect or midline shift is noted. There is no evidence of hemorrhage or mass lesion. Vascular: No hyperdense vessel or unexpected calcification. Skull: Normal. Negative for fracture or focal lesion. Sinuses/Orbits: No acute finding. Other: None. IMPRESSION: New right occipital low density is noted most consistent with acute infarction. MRI is recommended for further evaluation. Electronically Signed   By: Marijo Conception  M.D.   On: 12/25/2019 14:04   CT ANGIO NECK W OR WO CONTRAST  Result Date: 12/26/2019 CLINICAL DATA:  Vision change and altered mental status EXAM: CT ANGIOGRAPHY HEAD AND NECK TECHNIQUE: Multidetector CT imaging of the head and neck was performed using the standard protocol during bolus administration of intravenous contrast. Multiplanar CT image reconstructions and MIPs were obtained to evaluate the vascular anatomy. Carotid stenosis measurements (when applicable) are obtained utilizing NASCET criteria, using the distal internal carotid diameter as the denominator. CONTRAST:  119mL OMNIPAQUE IOHEXOL 350 MG/ML SOLN COMPARISON:  None. FINDINGS: CT HEAD FINDINGS Brain: There is no mass, hemorrhage or extra-axial collection. The size and configuration of the ventricles and extra-axial CSF spaces are normal. Unchanged appearance of right occipital lobe infarct. There is hypoattenuation of the periventricular white matter, most commonly indicating chronic ischemic microangiopathy. Skull: The visualized skull base, calvarium and extracranial soft tissues are normal. Sinuses/Orbits: No fluid levels or advanced mucosal thickening of the visualized paranasal sinuses. No mastoid or middle ear effusion. The orbits are normal. CTA NECK FINDINGS SKELETON: There is no bony spinal canal stenosis. No lytic or blastic lesion. OTHER NECK: Normal pharynx, larynx and major salivary glands. No cervical lymphadenopathy. Unremarkable thyroid gland. UPPER CHEST: No pneumothorax or pleural effusion. No nodules or masses. AORTIC ARCH: There is mild calcific atherosclerosis of the aortic arch. There is no aneurysm, dissection or hemodynamically significant stenosis of the visualized portion of the aorta. Conventional 3 vessel aortic branching pattern. The visualized proximal subclavian arteries are widely patent. RIGHT CAROTID SYSTEM: Multifocal undulation. No stenosis. No dissection or occlusion. LEFT CAROTID SYSTEM: As on the  contralateral side, there is an area of undulation. No atherosclerosis, occlusion or stenosis. VERTEBRAL ARTERIES: Left dominant configuration. Both origins are clearly patent. There is no dissection, occlusion or flow-limiting stenosis to the skull base (V1-V3 segments). CTA HEAD FINDINGS POSTERIOR CIRCULATION: --Vertebral arteries: Moderate stenosis of the proximal left V4 segment. Normal right. --Posterior inferior cerebellar arteries (PICA): Patent origins from the vertebral arteries. --Anterior inferior cerebellar arteries (AICA): Patent origins from the basilar artery. --Basilar artery: Normal. --Superior cerebellar arteries: Normal. --Posterior cerebral arteries: Short segment occlusion of the distal P2 segment of the right PCA. Moderate stenosis of the proximal P2 segment of the left PCA. ANTERIOR CIRCULATION: --Intracranial internal carotid arteries: Normal. --Anterior cerebral arteries (ACA): Normal. Absent right A1 segment, normal variant --Middle cerebral arteries (MCA): Normal. VENOUS SINUSES: As permitted by contrast timing,  patent. ANATOMIC VARIANTS: None Review of the MIP images confirms the above findings. IMPRESSION: 1. Short segment occlusion of the distal P2 segment of the right PCA. 2. Moderate stenosis of the proximal P2 segment of the left PCA. 3. Moderate stenosis of the proximal left V4 segment of the posterior cerebral artery. 4. Bilateral cervical internal carotid artery fibromuscular dysplasia. Electronically Signed   By: Ulyses Jarred M.D.   On: 12/26/2019 00:12   MR ANGIO HEAD WO CONTRAST  Result Date: 12/25/2019 CLINICAL DATA:  Altered mental status. Dementia. New right posterior brain stroke seen by CT. EXAM: MRI HEAD WITHOUT CONTRAST MRA HEAD WITHOUT CONTRAST TECHNIQUE: Multiplanar, multiecho pulse sequences of the brain and surrounding structures were obtained without intravenous contrast. Angiographic images of the head were obtained using MRA technique without contrast.  COMPARISON:  Head CT earlier same day.  MRI 06/25/2019. FINDINGS: MRI HEAD FINDINGS Brain: Acute infarction in the right posterior cerebral artery territory affecting the parieto-occipital brain, the right hippocampus, and with punctate involvement of the right lateral thalamus. Mild swelling but no gross hemorrhage. No other vascular distribution insult is identified acutely. Elsewhere, there chronic small-vessel ischemic changes of the pons. Old small vessel cerebellar insults. Cerebral hemispheres otherwise show patchy and confluent chronic small vessel disease throughout the hemispheric white matter and old small vessel infarction in the right thalamus and the basal ganglia. No mass, hemorrhage, hydrocephalus or extra-axial collection. Vascular: Major vessels at the base of the brain show flow. Skull and upper cervical spine: Negative Sinuses/Orbits: Clear/normal Other: None MRA HEAD FINDINGS The study suffers from motion degradation. Both internal carotid arteries show flow into the brain. Both anterior and middle cerebral arteries show flow. Diminutive A1 segment on the right with the anterior cerebral arteries receiving most of there supply from the left carotid circulation. Both vertebral arteries show antegrade flow to the basilar. The basilar artery shows flow. Flow is present within the proximal superior cerebellar and posterior cerebral arteries. Distal vessel evaluation is not possible because of motion degradation. IMPRESSION: Acute infarction in the right PCA territory with involvement of the parieto-occipital brain, right hippocampus and small focus of the right lateral thalamus. Mild swelling but no evidence of hemorrhage. Extensive chronic ischemic changes elsewhere throughout the brain as outlined above. Major vessels at the base of the brain show flow. The MR angiogram is markedly degraded by motion. There is flow at least in the proximal aspect of the right PCA. The distal branch vessels are not  evaluated because of motion degradation Electronically Signed   By: Nelson Chimes M.D.   On: 12/25/2019 16:18   MR Brain Wo Contrast (neuro protocol)  Result Date: 12/25/2019 CLINICAL DATA:  Altered mental status. Dementia. New right posterior brain stroke seen by CT. EXAM: MRI HEAD WITHOUT CONTRAST MRA HEAD WITHOUT CONTRAST TECHNIQUE: Multiplanar, multiecho pulse sequences of the brain and surrounding structures were obtained without intravenous contrast. Angiographic images of the head were obtained using MRA technique without contrast. COMPARISON:  Head CT earlier same day.  MRI 06/25/2019. FINDINGS: MRI HEAD FINDINGS Brain: Acute infarction in the right posterior cerebral artery territory affecting the parieto-occipital brain, the right hippocampus, and with punctate involvement of the right lateral thalamus. Mild swelling but no gross hemorrhage. No other vascular distribution insult is identified acutely. Elsewhere, there chronic small-vessel ischemic changes of the pons. Old small vessel cerebellar insults. Cerebral hemispheres otherwise show patchy and confluent chronic small vessel disease throughout the hemispheric white matter and old small vessel infarction in  the right thalamus and the basal ganglia. No mass, hemorrhage, hydrocephalus or extra-axial collection. Vascular: Major vessels at the base of the brain show flow. Skull and upper cervical spine: Negative Sinuses/Orbits: Clear/normal Other: None MRA HEAD FINDINGS The study suffers from motion degradation. Both internal carotid arteries show flow into the brain. Both anterior and middle cerebral arteries show flow. Diminutive A1 segment on the right with the anterior cerebral arteries receiving most of there supply from the left carotid circulation. Both vertebral arteries show antegrade flow to the basilar. The basilar artery shows flow. Flow is present within the proximal superior cerebellar and posterior cerebral arteries. Distal vessel  evaluation is not possible because of motion degradation. IMPRESSION: Acute infarction in the right PCA territory with involvement of the parieto-occipital brain, right hippocampus and small focus of the right lateral thalamus. Mild swelling but no evidence of hemorrhage. Extensive chronic ischemic changes elsewhere throughout the brain as outlined above. Major vessels at the base of the brain show flow. The MR angiogram is markedly degraded by motion. There is flow at least in the proximal aspect of the right PCA. The distal branch vessels are not evaluated because of motion degradation Electronically Signed   By: Nelson Chimes M.D.   On: 12/25/2019 16:18   DG Chest Port 1 View  Result Date: 12/25/2019 CLINICAL DATA:  Altered mental status.  Hypertension. EXAM: PORTABLE CHEST 1 VIEW COMPARISON:  June 24, 2019 FINDINGS: There is slight atelectasis in the left base. The lungs elsewhere are clear. Heart size and pulmonary vascularity are normal. No adenopathy. There is aortic atherosclerosis. There are breast implants bilaterally. There is midthoracic levoscoliosis. IMPRESSION: Slight left base atelectasis. Lungs elsewhere clear. Stable cardiac silhouette. No adenopathy. Breast implants bilaterally. Aortic Atherosclerosis (ICD10-I70.0). Electronically Signed   By: Lowella Grip III M.D.   On: 12/25/2019 13:19     ASSESSMENT AND PLAN  79 y.o. female with past medical history significant for vascular dementia, hypertension, depression presented to [facility] emergency department via EMS after altered mental status for several days, visual loss and left-sided weakness.   Subacute right PCA infarction  Etiology: Atheroembolic versus cardioembolic  Recommend #CT angiogram of the head and neck #Transthoracic Echo  # Start patient on ASA 325mg  daily #Start or continue Atorvastatin 40 mg/other high intensity statin # BP goal: permissive HTN upto 220/120 mmHg ( 185/110 if patient has CHF, CKD) #  HBAIC and Lipid profile # Telemetry monitoring # Frequent neuro checks # Stroke swallow screen #PT/OT evaluation   Please page stroke NP  Or  PA  Or MD from 8am -4 pm  as this patient from this time will be  followed by the stroke.   You can look them up on [email or web address]  Password [facility]    Bracken Moffa [facility] Pager Number [phone]

## 2019-12-26 NOTE — Progress Notes (Signed)
Patient self removed both IV's and telemetry. 22g was placed in right forearm and wrapped to help prevent removal and telemetry was placed back on patient. Educated the patient on the importance to leave those items alone and she stated she understood. Will continue to monitor closely. Alianny Toelle RN

## 2019-12-26 NOTE — Progress Notes (Signed)
Bilateral lower extremity venous duplex exam completed.  Preliminary results can be found under CV proc under chart review.  12/26/2019 10:46 AM  Serrina Minogue, K., RDMS, RVT

## 2019-12-26 NOTE — Evaluation (Signed)
Physical Therapy Evaluation Patient Details Name: Claire West MRN: [id number] DOB: 1940-11-26 Today's Date: 12/26/2019   History of Present Illness  Patient is a 79 year old female who presented to [facility] with blurred vision and burning in her legs. She was found to have a right PCA occlusion. She was transfered to [facility] for further evlauation. Family reports progressive weakness and increased caregiver support over the past week. She had a fall last week.   Clinical Impression  Patient is required assist for all mobility. She has required progressive assistance over the past week from her caregivers at home. She required mod a to walk. She has 3 steps up to her bedroom. She may benefit most at this time from rehab at a SNF. Therapy will continue tow work with her at the hospital to improve her ability to transfer and walk safely.    Follow Up Recommendations SNF vs Home health PT;Supervision/Assistance - 24 hour depending on progress     Equipment Recommendations       Recommendations for Other Services Rehab consult     Precautions / Restrictions Precautions Precautions: Fall Restrictions Weight Bearing Restrictions: No      Mobility  Bed Mobility Overal bed mobility: Needs Assistance Bed Mobility: Supine to Sit;Sit to Supine     Supine to sit: Min assist Sit to supine: Min assist   General bed mobility comments: min a to scoot to the edge of the bed and min a to get back into the bed   Transfers Overall transfer level: Needs assistance Equipment used: Rolling walker (2 wheeled) Transfers: Sit to/from Stand Sit to Stand: Min assist         General transfer comment: min a for strength and balance to stand   Ambulation/Gait Ambulation/Gait assistance: Mod assist;Min assist Gait Distance (Feet): 12 Feet Assistive device: Rolling walker (2 wheeled) Gait Pattern/deviations: Shuffle Gait velocity: decreased  Gait velocity interpretation: <1.31 ft/sec,  indicative of household ambulator General Gait Details: Patient ambualted about 6 feet before her steps began to shorten. She at this time requred mod a and max VC's/TC's to get back to the bed   Stairs            Wheelchair Mobility    Modified Rankin (Stroke Patients Only) Modified Rankin (Stroke Patients Only) Pre-Morbid Rankin Score: No symptoms Modified Rankin: Moderately severe disability     Balance Overall balance assessment: Needs assistance Sitting-balance support: Bilateral upper extremity supported Sitting balance-Leahy Scale: Poor Sitting balance - Comments: min gaurd to min a to remain sitting    Standing balance support: Bilateral upper extremity supported Standing balance-Leahy Scale: Poor Standing balance comment: needed mod a towards the end of her walk                              Pertinent Vitals/Pain Pain Assessment: Faces Faces Pain Scale: No hurt    Home Living Family/patient expects to be discharged to:: Private residence Living Arrangements: Alone Available Help at Discharge: Personal care attendant;Available 24 hours/day;Family Type of Home: House Home Access: Stairs to enter   [employer] of Steps: 3 to get into house, +3 once inside house as described by pt's sister Home Layout: Two level;Able to live on main level with bedroom/bathroom   Additional Comments: pt has hospital bed on the first floor of her home, where she sleeps. Pt showers upstairs    Prior Function Level of Independence: Needs assistance  Gait / Transfers Assistance Needed: uses RW. Son reports increased caregiver support and gait belt use over the past week   ADL's / Homemaking Assistance Needed: Pt receives assist with cooking, cleaning, dressing, bathing self.        Hand Dominance   Dominant Hand: Right    Extremity/Trunk Assessment   Upper Extremity Assessment Upper Extremity Assessment: Defer to OT evaluation    Lower Extremity  Assessment Lower Extremity Assessment: Generalized weakness    Cervical / Trunk Assessment Cervical / Trunk Assessment: Kyphotic  Communication   Communication: No difficulties  Cognition Arousal/Alertness: Awake/alert Behavior During Therapy: Flat affect                                   General Comments: Patient was alert and answered some questioning but not all questions. Most questions answered by the family.       General Comments General comments (skin integrity, edema, etc.): head listing down and to the right. Increased as she fatiuged     Exercises     Assessment/Plan    PT Assessment Patient needs continued PT services  PT Problem List Decreased strength;Decreased activity tolerance;Decreased balance;Decreased mobility;Decreased coordination;Decreased cognition;Decreased knowledge of use of DME       PT Treatment Interventions DME instruction;Gait training;Stair training;Therapeutic activities;Functional mobility training;Therapeutic exercise;Neuromuscular re-education;Patient/family education    PT Goals (Current goals can be found in the Care Plan section)  Acute Rehab PT Goals Patient Stated Goal: unable to state PT Goal Formulation: With patient/family Time For Goal Achievement: 01/02/20 Potential to Achieve Goals: Good    Frequency Min 4X/week   Barriers to discharge        Co-evaluation               AM-PAC PT "6 Clicks" Mobility  Outcome Measure Help needed turning from your back to your side while in a flat bed without using bedrails?: A Lot Help needed moving from lying on your back to sitting on the side of a flat bed without using bedrails?: A Lot Help needed moving to and from a bed to a chair (including a wheelchair)?: A Lot Help needed standing up from a chair using your arms (e.g., wheelchair or bedside chair)?: A Lot Help needed to walk in hospital room?: A Lot Help needed climbing 3-5 steps with a railing? : A Lot 6  Click Score: 12    End of Session Equipment Utilized During Treatment: Gait belt Activity Tolerance: Patient limited by fatigue Patient left: in bed;with call bell/phone within reach;with bed alarm set;with family/visitor present Nurse Communication: Mobility status PT Visit Diagnosis: Unsteadiness on feet (R26.81);Other abnormalities of gait and mobility (R26.89);Muscle weakness (generalized) (M62.81)    Time: 1440-1500 PT Time Calculation (min) (ACUTE ONLY): 20 min   Charges:   PT Evaluation $PT Eval Moderate Complexity: 1 Mod            Carney Living PT DPT  12/26/2019, 3:14 PM

## 2019-12-27 ENCOUNTER — Inpatient Hospital Stay (HOSPITAL_COMMUNITY): Payer: Medicare Other

## 2019-12-27 DIAGNOSIS — I63531 Cerebral infarction due to unspecified occlusion or stenosis of right posterior cerebral artery: Principal | ICD-10-CM

## 2019-12-27 DIAGNOSIS — B029 Zoster without complications: Secondary | ICD-10-CM

## 2019-12-27 DIAGNOSIS — I351 Nonrheumatic aortic (valve) insufficiency: Secondary | ICD-10-CM

## 2019-12-27 LAB — ECHOCARDIOGRAM COMPLETE
Height: 66 in
Weight: 2287.99 oz

## 2019-12-27 MED ORDER — ATORVASTATIN CALCIUM 40 MG PO TABS
40.0000 mg | ORAL_TABLET | Freq: Every day | ORAL | Status: DC
  Administered 2019-12-27: 40 mg via ORAL
  Filled 2019-12-27: qty 1

## 2019-12-27 NOTE — Evaluation (Signed)
Occupational Therapy Evaluation Patient Details Name: Claire West MRN: [id number] DOB: 1941-04-08 Today's Date: 12/27/2019    History of Present Illness Pt is a 79 year old female who presented to [facility] with blurred vision and burning in her legs. Imaging: right PCA occlusion with parietal-occipital brain right hippocampus and small focus of right lateral thalamus. She was transfered to [facility] for further evaluation. Family reports weakness, visual changes and increased assist from caregivers. PMHx: HTN, depression.   Clinical Impression   Pt PTA: Pt living at home with family/caregivers 24/7 per chart. Pt requiring assist for ADL/IADL. Pt currently with Pt with deficits in visual attention to L side, possible visual field cut in L eye, decreased ability to safely care for self and decreased strength. Pt requiring cues for memory and sequencing through tasks. Pt requiring cues to move RW over to R as pt constantly bumping into obstacles on the L side. Pt minA overall for mobility and minA overall for ADL. Pt would benefit from continued OT skilled services for ADL, mobility and safety in SNF setting. OT following acutely.    Follow Up Recommendations  SNF;Supervision/Assistance - 24 hour(Pt could benefit from skilled therapy before d/c home.)    Equipment Recommendations  Other (comment)(to be determined)    Recommendations for Other Services       Precautions / Restrictions Precautions Precautions: Fall Restrictions Weight Bearing Restrictions: No      Mobility Bed Mobility Overal bed mobility: Needs Assistance Bed Mobility: Supine to Sit     Supine to sit: Min assist     General bed mobility comments: Assist to scoot to EOB  Transfers Overall transfer level: Needs assistance Equipment used: Rolling walker (2 wheeled) Transfers: Sit to/from Stand Sit to Stand: Min assist         General transfer comment: minA for power-up and stability with initial  stance    Balance Overall balance assessment: Needs assistance Sitting-balance support: Bilateral upper extremity supported Sitting balance-Leahy Scale: Fair Sitting balance - Comments: min guard    Standing balance support: Bilateral upper extremity supported Standing balance-Leahy Scale: Poor Standing balance comment: VCs for upright posture and positioning                           ADL either performed or assessed with clinical judgement   ADL Overall ADL's : Needs assistance/impaired Eating/Feeding: Set up;Sitting   Grooming: Min guard;Standing Grooming Details (indicate cue type and reason): Increased time, cues for locating items Upper Body Bathing: Min guard;Standing   Lower Body Bathing: Minimal assistance;Sitting/lateral leans;Sit to/from stand;Cueing for safety;Cueing for sequencing   Upper Body Dressing : Min guard;Standing   Lower Body Dressing: Min guard;Cueing for safety;Cueing for compensatory techniques;Sitting/lateral leans;Sit to/from stand   Toilet Transfer: Min guard;RW;Ambulation   Toileting- Water quality scientist and Hygiene: Minimal assistance;Cueing for safety;Sitting/lateral lean;Sit to/from Sales promotion account executive Details (indicate cue type and reason): Assist required as pt unsafe and inattentive to L side     Functional mobility during ADLs: Minimal assistance;Rolling walker;Cueing for safety;Cueing for sequencing General ADL Comments: Pt with deficits in visual attention to L side, possible visual field cut in L eye, decreased ability to safely care for self and decreased strength.Pt requiring cues to move RW over to R as pt constantly bumping into obstacles on the L side.     Vision Baseline Vision/History: Wears glasses Wears Glasses: At all times Vision Assessment?: Vision impaired- to be further  tested in functional context;Yes Eye Alignment: Impaired (comment) Ocular Range of Motion: Impaired-to be further tested  in functional context Alignment/Gaze Preference: Other (comment)(Pt at first had gaze right with head tilt to R) Additional Comments: Pt unable to attend to task to scan through all visual fields. Pt would lose focus and skip parts of eye exam.      Perception Perception Perception Tested?: Yes Perception Deficits: Inattention/neglect Inattention/Neglect: Does not attend to left visual field;Does not attend to left side of body Spatial deficits: Pt bumping into items on L side. Pt requiring cues to attend to L side.   Praxis      Pertinent Vitals/Pain Pain Assessment: Faces Faces Pain Scale: No hurt     Hand Dominance Right   Extremity/Trunk Assessment Upper Extremity Assessment Upper Extremity Assessment: Generalized weakness;RUE deficits/detail;LUE deficits/detail RUE Deficits / Details: 3/5 MM grade grossly LUE Deficits / Details: 3-/5 MM grade grossly   Lower Extremity Assessment Lower Extremity Assessment: Defer to PT evaluation;Generalized weakness   Cervical / Trunk Assessment Cervical / Trunk Assessment: Kyphotic   Communication Communication Communication: No difficulties   Cognition Arousal/Alertness: Awake/alert Behavior During Therapy: Flat affect Overall Cognitive Status: Impaired/Different from baseline Area of Impairment: Safety/judgement;Following commands;Problem solving;Awareness;memory                       Following Commands: Follows one step commands with increased time Safety/Judgement: Decreased awareness of safety;Decreased awareness of deficits Awareness: Intellectual Problem Solving: Slow processing;Decreased initiation;Difficulty sequencing;Requires verbal cues;Requires tactile cues General Comments: Pt followed commands with increased time. Pt requiring assist for safety awareness and to increase awareness of deficits.   General Comments  VSS on RA. BP high prior to taking medications: 198/83, RN aware,    Exercises     Shoulder  Instructions      Home Living Family/patient expects to be discharged to:: Private residence Living Arrangements: Alone Available Help at Discharge: Personal care attendant;Available 24 hours/day;Family Type of Home: House Home Access: Stairs to enter [employer] of Steps: 3 to get into house, +3 once inside house as described by pt's sister   Home Layout: Two level;Able to live on main level with bedroom/bathroom                   Additional Comments: pt has hospital bed on the first floor of her home, where she sleeps. Pt showers upstairs      Prior Functioning/Environment Level of Independence: Needs assistance  Gait / Transfers Assistance Needed: uses RW. Son reports increased caregiver support and gait belt use over the past week  ADL's / Homemaking Assistance Needed: Pt receives assist with cooking, cleaning, dressing, bathing self.   Comments: PLOF from son during PT intervention        OT Problem List: Decreased strength;Decreased activity tolerance;Impaired balance (sitting and/or standing);Impaired vision/perception;Decreased coordination;Decreased cognition;Decreased safety awareness;Impaired UE functional use;Pain;Increased edema      OT Treatment/Interventions: Self-care/ADL training;Therapeutic exercise;Neuromuscular education;Energy conservation;DME and/or AE instruction;Cognitive remediation/compensation    OT Goals(Current goals can be found in the care plan section) Acute Rehab OT Goals Patient Stated Goal: unable to state OT Goal Formulation: Patient unable to participate in goal setting Time For Goal Achievement: 01/10/20 Potential to Achieve Goals: Fair ADL Goals Pt Will Perform Grooming: with supervision;standing Pt Will Perform Lower Body Dressing: with supervision;sitting/lateral leans;sit to/from stand Pt/caregiver will Perform Home Exercise Program: Increased strength;Both right and left upper extremity;With Supervision Additional  ADL Goal #1: Pt will follow 1-3  multi-step commands during ADL or mobility tasks with minimal cues to sequence through task. Additional ADL Goal #2: Pt will be able to recall 3 items after 5 mins with minimal cueing.  OT Frequency: Min 2X/week   Barriers to D/C:            Co-evaluation              AM-PAC OT "6 Clicks" Daily Activity     Outcome Measure Help from another person eating meals?: None Help from another person taking care of personal grooming?: A Little Help from another person toileting, which includes using toliet, bedpan, or urinal?: A Little Help from another person bathing (including washing, rinsing, drying)?: A Lot Help from another person to put on and taking off regular upper body clothing?: A Little Help from another person to put on and taking off regular lower body clothing?: A Lot 6 Click Score: 17   End of Session Equipment Utilized During Treatment: Gait belt;Rolling walker Nurse Communication: Mobility status  Activity Tolerance: Patient tolerated treatment well Patient left: in chair;with call bell/phone within reach;with chair alarm set  OT Visit Diagnosis: Unsteadiness on feet (R26.81);Muscle weakness (generalized) (M62.81);Other symptoms and signs involving cognitive function                Time: [id number] OT Time Calculation (min): 24 min Charges:  OT General Charges $OT Visit: 1 Visit OT Evaluation $OT Eval Moderate Complexity: 1 Mod OT Treatments $Self Care/Home Management : 8-22 mins  Jefferey Pica, OTR/L Acute Rehabilitation Services Pager: [phone] Office: [phone]   Finnick Orosz C 12/27/2019, 2:42 PM

## 2019-12-27 NOTE — Progress Notes (Signed)
PROGRESS NOTE  Claire West [id number] DOB: 07/09/1941 DOA: 12/25/2019 PCP: Sharilyn Sites, MD  Brief History   The patient is a 79 yr old woman who presented to [facility] ED from home on 3/19/2021with complaints of perseveration, altered mental status, and weakness in the left upper extremity. There is also some change in the patient's vision with an apparent decrease in her ability to see things at a distance. The patient saw her neurologist on 12/17/2019 with complaints of confusion, disorientation, and agitation. She was also diagnosed with a zoster eruption on her right trunk. She was started on Valtrex for that, and she completed therapy on 12/25/2019. She was also started on Namenda on that visit.   The patient has a medical history significant for vascular dementia, mild cognitive impairment, hypertension, and depression. At [place] line the patient lives at home with the assistance of an aide 24 hours a day. She ambulates with a walker at baseline. She is on ASA 81 mg daily at baseline.  In the ED at [facility] the patient was found to be hypertensive with a low grade temp of 99.7. CBC and BNP were unremarkable. CXR demonstrated a left base atelectasis. CT hea suggested acute infarct of the right occiput. The patient then underwent MRI and MRA. The MRI/MRA demonstrated an acute infarct in the right PCA territory with involvement of the parieto-occipital brain, right hippocampos and small focus of right lateral thalamus.  MRA markedly degraded by motion.  The patient was transferred to [facility] for admission for further evaluation and treatment. Lipid panel demonstrates LDL of 105. Hemoglobin A1c was 5.3. Thus far the patient has undergone CTA head and neck which demonstrated  1. Short segment occlusion of the distal P2 segment of the right PCA. 2. Moderate stenosis of the proximal P2 segment of the left PCA. 3. Moderate stenosis of the proximal left V4 segment of the posterior cerebral artery. 4.  Bilateral cervical internal carotid artery fibromuscular Dysplasia.  Echocardiogram has demonstrated EF of 55-60% with normal function. No regional wall motion abnormalities. There is Grade I diastolic dysfunction. There is no intracardiac source of embolus seen.The patient has been evaluated by PT/OT. The recommendation is for SNF. TOC has been consulted to assist with placement.  Consultants  . Neurology  Procedures  . none  Antibiotics   Anti-infectives (From admission, onward)   None     Subjective  The patient is seen ambulating with PT in the halls. No new complaints. Son is at bedside.  Objective   Vitals:  Vitals:   12/27/19 0757 12/27/19 1300  BP: (!) 197/77 136/73  Pulse: 70 82  Resp: 18 20  Temp: 98.3 F (36.8 C) 97.8 F (36.6 C)  SpO2: 99% 95%  Exam:  Constitutional:  The patient is awake and alert. Mentation and speech are improved.  Eyes:  . pupils and irises appear normal . Normal lids and conjunctivae . Vision remains impaired. Respiratory:  . No increased work of breathing. . No wheezes, rales, or rhonchi . No tactile fremitus Cardiovascular:  . Regular rate and rhythm . No murmurs, ectopy, or gallups. . No lateral PMI. No thrills. Abdomen:  . Abdomen is soft, non-tender, non-distended . No hernias, masses, or organomegaly . Normoactive bowel sounds.  Musculoskeletal:  . No cyanosis, clubbing, or edema Skin:  . No rashes, lesions, ulcers . palpation of skin: no induration or nodules Neurologic:  . CN 2-12 intact . Sensation all 4 extremities intact . Right lower extremity weakness .  Visual deficits in right eye Psychiatric:  . In good spirits.   I have personally reviewed the following:   Today's Data  . Vitals  Cardiology Data  . EKG: NSR . Echocardiogram: has demonstrated EF of 55-60% with normal function. No regional wall motion abnormalities. There is Grade I diastolic dysfunction. There is no intracardiac source of embolus  seen.  Scheduled Meds: . aspirin EC  325 mg Oral Daily  . clopidogrel  75 mg Oral Daily  . escitalopram  20 mg Oral QHS  . heparin  5,000 Units Subcutaneous Q8H  . hydrochlorothiazide  25 mg Oral Daily  . irbesartan  150 mg Oral Daily  . metoprolol tartrate  50 mg Oral BID  . pravastatin  20 mg Oral QHS   Continuous Infusions:  Active Problems:   Acute CVA (cerebrovascular accident) ([place])   LOS: 2 days   A & P   Acute cerebrovascular accident: The patient presented with altered mental status, in the setting of herpes zoster infection. MRI brain shows acute infarct in right PCA with involvement of parietal-occipital brain right hippocampus and small focus of right lateral thalamus.  Son reports left upper extremity weakness, vision changes. Objectively she appears to have- visual defects involving right eye, and right lower extremity weakness. CTA head and neck have been completed and demonstrated: 1. Short segment occlusion of the distal P2 segment of the right PCA. 2. Moderate stenosis of the proximal P2 segment of the left PCA. 3. Moderate stenosis of the proximal left V4 segment of the posterior cerebral artery. 4. Bilateral cervical internal carotid artery fibromuscular Dysplasia. Echocardiogram: EF of 55-60% with normal function. No regional wall motion abnormalities. There is Grade I diastolic dysfunction. There is no intracardiac source of embolus seen. Carotid Dopplers are pending as are echocardiogram and PT/OT/SLP eval. Neurology has been consulted. Lipid panel demonstrates an LDL of 105. HbgA1c was 5.3. She has been continued on aspirin and plavix as per neurology's recommendation. She has been evaluated by PT/OT. Plan is for the patient to go to SNF. TOC has been consulted to assist with placement.  Depression, history of vascular dementia, mild cognitive impairment: Resume home Xanax 0.5 mg as needed at bedtime, resume home Lexapro.  Visual loss: Pt will follow up with  ophthalmology as outpatient.  Hypertension: 160s to 180s.  Stroke symptoms have been present for several days.Resume home metoprolol and olmesartan, HCTZ.  Recent Herpes Zoster infection: Course of Valtrex was completed on 12/25/2019.  I have seen and examined this patient myself. I have spent 32 minutes in her evaluation and care.  DVT prophylaxis: Heparin Code Status: Full code Family Communication: Patient's son Sandi Mariscal is healthcare power of attorney present at bedside. I have discussed the patient in detail with him. All questions answered to the best of my ability. Disposition Plan: Patient is from home with 24/7 assistance. However PT/OT have determined that the patient will require SNF placement before she may return there. She is awaiting placement.  Cortlynn Hollinsworth, DO [facility] Direct contact: see [email or web address]  7PM-7AM contact night coverage as above 12/27/2019, 3:34 PM  LOS: 1 day

## 2019-12-27 NOTE — Progress Notes (Signed)
Echocardiogram 2D Echocardiogram has been performed.  Oneal Deputy Capri Raben 12/27/2019, 11:04 AM

## 2019-12-27 NOTE — Evaluation (Signed)
Speech Language Pathology Evaluation Patient Details Name: Claire West MRN: [id number] DOB: [birth year] Today's Date: 12/27/2019 Time: [id number] SLP Time Calculation (min) (ACUTE ONLY): 36 min  Problem List:  Patient Active Problem List   Diagnosis Date Noted  . Acute CVA (cerebrovascular accident) ([place]) 12/25/2019  . Restlessness and agitation 12/17/2019  . Vascular dementia with behavior disturbance ([place]) 12/17/2019  . Mild cognitive impairment 06/25/2019  . Depression 06/25/2019  . Falls frequently 10/28/2018  . TIA (transient ischemic attack) 08/23/2018  . Delirium in remission 07/07/2018  . OA (osteoarthritis) of knee 05/05/2018  . Amnestic MCI (mild cognitive impairment with memory loss) 12/24/2017  . Acute metabolic encephalopathy [id number]  . Altered mental status 11/06/2017  . Hyponatremia 02/12/2016  . Hypokalemia 02/12/2016  . Dehydration 02/12/2016  . Elevated LFTs 02/12/2016  . HTN (hypertension) 02/12/2016  . GERD (gastroesophageal reflux disease) 02/12/2016   Past Medical History:  Past Medical History:  Diagnosis Date  . Arthritis   . Depression   . Gait instability    walks with cane  . GERD (gastroesophageal reflux disease)   . History of blood transfusion   . Hyperlipemia   . Hypertension   . Knee pain   . Metabolic encephalopathy   . Mild cognitive impairment with memory loss   . Seasonal allergies   . Skin cancer    Past Surgical History:  Past Surgical History:  Procedure Laterality Date  . ABDOMINAL HYSTERECTOMY    . ARTHROSCOPIC REPAIR ACL Left   . COLONOSCOPY    . COLONOSCOPY N/A 11/23/2015   Procedure: COLONOSCOPY;  Surgeon: Rogene Houston, MD;  Location: [facility];  Service: Endoscopy;  Laterality: N/A;  830  . EYE SURGERY Bilateral    Cataracts  . SKIN CANCER EXCISION     Right shoulder  . TOTAL KNEE ARTHROPLASTY Left 05/05/2018   Procedure: LEFT TOTAL KNEE ARTHROPLASTY;  Surgeon: Gaynelle Arabian, MD;  Location: [facility];   Service: Orthopedics;  Laterality: Left;  with block  . TOTAL KNEE ARTHROPLASTY Right 06/22/2019   Procedure: TOTAL KNEE ARTHROPLASTY;  Surgeon: Gaynelle Arabian, MD;  Location: [facility];  Service: Orthopedics;  Laterality: Right;  21min   HPI:  Pt is a 79 y.o. female with medical history significant for vascular dementia, mild cognitive impairments, hypertension, depression who presented to the ED via EMS with reports of altered mental status with repetition of the same statements and questions and paranoia. MRI of the brain: Acute infarction in the right PCA territory with involvement of the parieto-occipital brain, right hippocampus and small focus of the right lateral thalamus.    Assessment / Plan / Recommendation Clinical Impression  Pt participated in speech/language/cognition evaluation with her son present. Pt does have a diagnosis of vascular dementia and pt did not present as a reliable historian. Pt's son reported that the pt has deficits in memory and attention at baseline and has 24-hour supervision. He stated that there has been some exacerbation of these symptoms since the CVA, that there has been increased confusion, and that the pt's processing speed has also increased. The [facility] Mental Status Examination was completed to evaluate the pt's cognitive-linguistic skills. She achieved a score of 9/30 which is below the normal limits of 27 or more out of 30 and is now worse than the mild impairment which was demonstrated when she last saw her outpatient neurologist. She exhibited difficulty in the areas of awareness, attention, memory, executive function, reasoning, and complex problem solving. Skilled SLP  services are clinically indicated at this time to improve cognitive-linguistic function.    SLP Assessment  SLP Recommendation/Assessment: Patient needs continued Speech Lanaguage Pathology Services SLP Visit Diagnosis: Cognitive communication deficit (R41.841)    Follow Up  Recommendations  Skilled Nursing facility    Frequency and Duration min 2x/week  2 weeks      SLP Evaluation Cognition  Overall Cognitive Status: Impaired/Different from baseline Arousal/Alertness: Awake/alert Orientation Level: Oriented to person;Oriented to place;Disoriented to time(Year: 2020; Month: January; Day: Monday) Attention: Focused;Sustained Focused Attention: Impaired Focused Attention Impairment: Verbal basic Sustained Attention: Impaired Sustained Attention Impairment: Verbal complex Memory: Impaired Memory Impairment: Storage deficit;Retrieval deficit(Immediate: 4/5; delayed: 1/5) Awareness: Impaired Awareness Impairment: Intellectual impairment Problem Solving: Impaired Problem Solving Impairment: Verbal complex Executive Function: Reasoning;Sequencing Reasoning: Impaired Reasoning Impairment: Verbal complex Sequencing: Impaired Sequencing Impairment: Verbal complex       Comprehension  Auditory Comprehension Overall Auditory Comprehension: Impaired at baseline Yes/No Questions: Impaired Basic Immediate Environment Questions: (4/4) Complex Questions: (5/5) Paragraph Comprehension (via yes/no questions): (2/4) Commands: Impaired Two Step Basic Commands: (2/4) Multistep Basic Commands: (3/4) Interfering Components: Processing speed;Working Marine scientist    Expression Expression Primary Mode of Expression: Verbal Verbal Expression Overall Verbal Expression: Appears within functional limits for tasks assessed Initiation: No impairment Level of Generative/Spontaneous Verbalization: Conversation Naming: No impairment Pragmatics: No impairment Interfering Components: Attention Written Expression Dominant Hand: Right   Oral / Motor  Motor Speech Overall Motor Speech: Appears within functional limits for tasks assessed Respiration: Within functional limits Phonation: Normal Resonance: Within functional limits Articulation: Within functional  limitis Intelligibility: Intelligible Motor Planning: Witnin functional limits Motor Speech Errors: Not applicable   Jazsmine Macari I. Hardin Negus, [place], [place] Office number [phone] Pager [place] 12/27/2019, 4:26 PM

## 2019-12-27 NOTE — Progress Notes (Signed)
Physical Therapy Treatment Patient Details Name: Claire West MRN: [id number] DOB: 1940/12/17 Today's Date: 12/27/2019    History of Present Illness Patient is a 79 year old female who [facility] with blurred vision and burning in her legs. She was found to have a right PCA occlusion. She was transfered to [facility] for further evlauation. Family reports progressive weakness and increased caregiver support over the past week. She had a fall last week.     PT Comments    Treatment session focused on ambulation with RW and navigation and scanning strategies. Patient with noted visual impedence on left side but scans well with VCs. Short term carry over limited and required max cues to direct. Min assist for stability during gait. Overall progressing well. Current POC remains appropriate.   Follow Up Recommendations  SNF;Home health PT;Supervision/Assistance - 24 hour     Equipment Recommendations       Recommendations for Other Services Rehab consult     Precautions / Restrictions Precautions Precautions: Fall Restrictions Weight Bearing Restrictions: No    Mobility  Bed Mobility               General bed mobility comments: recieved in bedside recline  Transfers Overall transfer level: Needs assistance Equipment used: Rolling walker (2 wheeled) Transfers: Sit to/from Stand Sit to Stand: Min assist         General transfer comment: Miin assist for power up to standing with continued assist for stability from recliner  Ambulation/Gait Ambulation/Gait assistance: Min assist Gait Distance (Feet): 140 Feet(x2 with 3 standing rest breaks) Assistive device: Rolling walker (2 wheeled) Gait Pattern/deviations: Shuffle Gait velocity: decreased    General Gait Details: VCs for upright posture and positioning, min assist for stability. Session with gait focus on navigation and visual attentio to environment   Stairs             Wheelchair  Mobility    Modified Rankin (Stroke Patients Only) Modified Rankin (Stroke Patients Only) Pre-Morbid Rankin Score: No symptoms Modified Rankin: Moderately severe disability     Balance Overall balance assessment: Needs assistance Sitting-balance support: Bilateral upper extremity supported Sitting balance-Leahy Scale: Fair Sitting balance - Comments: min guard    Standing balance support: Bilateral upper extremity supported Standing balance-Leahy Scale: Poor Standing balance comment: VCs for upright posture and positioning                            Cognition Arousal/Alertness: Awake/alert Behavior During Therapy: Flat affect                                   General Comments: Patient was alert and answered some questioning but not all questions. Most questions answered by the family.       Exercises      General Comments        Pertinent Vitals/Pain Faces Pain Scale: No hurt    Home Living                      Prior Function            PT Goals (current goals can now be found in the care plan section) Acute Rehab PT Goals Patient Stated Goal: unable to state PT Goal Formulation: With patient/family Time For Goal Achievement: 01/02/20 Potential to Achieve Goals: Good Progress towards PT goals:  Progressing toward goals    Frequency    Min 4X/week      PT Plan Current plan remains appropriate    Co-evaluation              AM-PAC PT "6 Clicks" Mobility   Outcome Measure  Help needed turning from your back to your side while in a flat bed without using bedrails?: A Lot Help needed moving from lying on your back to sitting on the side of a flat bed without using bedrails?: A Lot Help needed moving to and from a bed to a chair (including a wheelchair)?: A Lot Help needed standing up from a chair using your arms (e.g., wheelchair or bedside chair)?: A Little Help needed to walk in hospital room?: A Little Help  needed climbing 3-5 steps with a railing? : A Lot 6 Click Score: 14    End of Session Equipment Utilized During Treatment: Gait belt Activity Tolerance: Patient limited by fatigue Patient left: in chair;with call bell/phone within reach;with chair alarm set;with family/visitor present Nurse Communication: Mobility status PT Visit Diagnosis: Unsteadiness on feet (R26.81);Other abnormalities of gait and mobility (R26.89);Muscle weakness (generalized) (M62.81)     Time: [id number] PT Time Calculation (min) (ACUTE ONLY): 22 min  Charges:  $Gait Training: 8-22 mins                     Alben Deeds, PT DPT  Board Certified Neurologic Specialist [place] Office [place] 12/27/2019, 11:41 AM

## 2019-12-27 NOTE — Progress Notes (Signed)
STROKE TEAM PROGRESS NOTE   INTERVAL HISTORY Her son is at the bedside.  Patient sitting in chair for lunch.  Still has left hemianopia.  However, her mental status much improved, carry conversation.  However not orientated to time or her age.  As per son, she was prescribed Namenda from [facility] 3/11 but then developed shingles on the right side of her chest, back and breast.  She was started on valacyclovir and prednisone Dosepak.  However, on second day, she had altered mental status, barely communicating and sleeping a lot more.  Medication discontinued.  Patient gradually improved over the days.  Today is much better than yesterday.  MRI showed right PCA infarct.  And CTA head and neck showed bilateral PCA occlusion/stenosis.  Son and patient denies any heart palpitation, racing heart.  OBJECTIVE Vitals:   12/26/19 1935 12/27/19 0003 12/27/19 0449 12/27/19 0757  BP: (!) 154/72 (!) 170/60 (!) 157/88 (!) 197/77  Pulse: 92 73 69 70  Resp: 18 19 19 18   Temp: 98.2 F (36.8 C) 98.1 F (36.7 C) 98.3 F (36.8 C) 98.3 F (36.8 C)  TempSrc: Oral Oral Oral Oral  SpO2: 96% 99% 98% 99%  Weight:      Height:        CBC:  Recent Labs  Lab 12/25/19 1302  WBC 8.5  NEUTROABS 5.4  HGB 13.8  HCT 40.2  MCV 93.1  PLT 123456    Basic Metabolic Panel:  Recent Labs  Lab 12/25/19 1302  NA 134*  K 3.9  CL 96*  CO2 29  GLUCOSE 99  BUN 10  CREATININE 0.61  CALCIUM 9.0    Lipid Panel:     Component Value Date/Time   CHOL 172 12/26/2019 0733   TRIG 95 12/26/2019 0733   HDL 48 12/26/2019 0733   CHOLHDL 3.6 12/26/2019 0733   VLDL 19 12/26/2019 0733   LDLCALC 105 (H) 12/26/2019 0733   HgbA1c:  Lab Results  Component Value Date   HGBA1C 5.3 12/26/2019   Urine Drug Screen:     Component Value Date/Time   LABOPIA NONE DETECTED 03/13/2019 1625   COCAINSCRNUR NONE DETECTED 03/13/2019 1625   LABBENZ NONE DETECTED 03/13/2019 1625   AMPHETMU NONE DETECTED 03/13/2019 1625   THCU NONE DETECTED  03/13/2019 1625   LABBARB NONE DETECTED 03/13/2019 1625    Alcohol Level     Component Value Date/Time   ETH <10 03/13/2019 1632    IMAGING  CT ANGIO HEAD W OR WO CONTRAST CT ANGIO NECK W OR WO CONTRAST 12/26/2019 IMPRESSION:  1. Short segment occlusion of the distal P2 segment of the right PCA.  2. Moderate stenosis of the proximal P2 segment of the left PCA.  3. Moderate stenosis of the proximal left V4 segment of the posterior cerebral artery.  4. Bilateral cervical internal carotid artery fibromuscular dysplasia.   CT Head Wo Contrast 12/25/2019 IMPRESSION:  New right occipital low density is noted most consistent with acute infarction. MRI is recommended for further evaluation.   MR Brain Wo Contrast (neuro protocol) MR ANGIO HEAD WO CONTRAST 12/25/2019 IMPRESSION:  Acute infarction in the right PCA territory with involvement of the parieto-occipital brain, right hippocampus and small focus of the right lateral thalamus.  Mild swelling but no evidence of hemorrhage. Extensive chronic ischemic changes elsewhere throughout the brain as outlined above. Major vessels at the base of the brain show flow.  The MR angiogram is markedly degraded by motion. There is flow at least in  the proximal aspect of the right PCA. The distal branch vessels are not evaluated because of motion degradation   DG Chest Port 1 View 12/25/2019 IMPRESSION:  Slight left base atelectasis. Lungs elsewhere clear. Stable cardiac silhouette. No adenopathy. Breast implants bilaterally.  Aortic Atherosclerosis (ICD10-I70.0).    VAS [place] LOWER EXTREMITY VENOUS (DVT) 12/26/2019 Summary:  BILATERAL: - No evidence of deep vein thrombosis seen in the lower extremities, bilaterally.   RIGHT: - No cystic structure found in the popliteal fossa.   LEFT: - No cystic structure found in the popliteal fossa.   Preliminary    Transthoracic Echocardiogram  1. Left ventricular ejection fraction, by estimation, is 55 to  60%. The  left ventricle has normal function. The left ventricle has no regional  wall motion abnormalities. Left ventricular diastolic parameters are  consistent with Grade I diastolic  dysfunction (impaired relaxation).  2. Right ventricular systolic function is normal. The right ventricular  size is normal. Tricuspid regurgitation signal is inadequate for assessing  PA pressure.  3. The mitral valve is grossly normal. Trivial mitral valve  regurgitation.  4. The aortic valve was not well visualized. Aortic valve regurgitation  is mild.   ECG - SR rate 71 BPM. (See cardiology reading for complete details)    PHYSICAL EXAM  Temp:  [97.8 F (36.6 C)-98.4 F (36.9 C)] 97.8 F (36.6 C) (03/21 1300) Pulse Rate:  [69-92] 82 (03/21 1300) Resp:  [16-20] 20 (03/21 1300) BP: (136-197)/(60-88) 136/73 (03/21 1300) SpO2:  [95 %-99 %] 95 % (03/21 1300)  General - Well nourished, well developed, in no apparent distress.  Ophthalmologic - fundi not visualized due to noncooperation.  Cardiovascular - Regular rhythm and rate.  Neuro - awake alert, orientated to place and people, not orientated to time or age.  Fluent language, intact naming and repetition.  Able to follow simple commands.  Left hemianopia, PERRL, EOMI, left nasolabial fold mild flattening, tongue midline.  Moving all extremities symmetrically.  Sensation symmetrical.  Finger-to-nose bilateral intact, although slow in action.  Gait not tested.  DTR 1+, no Babinski.   ASSESSMENT/PLAN Ms. SIANN BUTTO is a 79 y.o. female with history of vascular dementia, hypertension, depression and recent herpes zoster rash on trunk presenting to [facility] ED with AMS, blurred vision and left leg weakness. She did not receive IV t-PA due to late presentation (>4.5 hours from time of onset).   Stroke:  Rt PCA territory infarct, likely due to large vessel atherosclerosis.  However cardioembolic source cannot be completed without.  Resultant  left hemianopia  CT head - New right occipital low density is noted most consistent with acute infarction.  MRI head - acute infarction in the right PCA territory with involvement of the parieto-occipital brain, right hippocampus and small focus of the right lateral thalamus.   CTA H&N - Short segment occlusion of the distal P2 segment of the right PCA.  Moderate stenosis left P2 and left V4. bilateral cervical internal carotid artery fibromuscular dysplasia.  LE venous Dopplers - negative for DVT  2D Echo 55 to 60%  Recommend 30-day cardiac event monitoring as outpatient to rule out A. fib  [place] Corona Virus 2 - negative  LDL - 105  HgbA1c - 5.3  VTE prophylaxis - [place] Heparin  aspirin 81 mg daily prior to admission, now on aspirin 325 mg daily and clopidogrel 75 mg daily DAPT for 3 months and then Plavix alone given intracranial stenosis/occlusion.  Patient counseled to be compliant with her  antithrombotic medications  Ongoing aggressive stroke risk factor management  Therapy recommendations:  SNF vs Home therapy  Disposition:  Pending  Hypertension  Home BP meds: metoprolol ; Benicar ; HCTZ  Current BP meds: metoprolol ; Benicar ; HCTZ  Blood pressure somewhat high at times but within post stroke/TIA parameters . Gradually normalize in 2-3 days . Long-term BP goal 130-150 given intracranial stenosis  Hyperlipidemia  Home Lipid lowering medication: Pravachol 20 mg daily  LDL 105, goal < 70  Current lipid lowering medication: Lipitor 40 mg daily  Continue statin at discharge  Other Stroke Risk Factors  Advanced age  ETOH use, advised to drink no more than 1 alcoholic beverage per day.  Other Active Problems  Code status - Full Code  Aortic Atherosclerosis (ICD10-I70.0)  Vascular dementia follow-up with [facility] - on [facility] day # 2  Neurology will sign off. Please call with questions. Pt will follow up with Ward Givens NP at [facility] in about 4  weeks. Thanks for the consult.  Rosalin Hawking, MD PhD Stroke Neurology 12/27/2019 3:36 PM    To contact Stroke Continuity provider, please refer to [email or web address] After hours, contact General Neurology

## 2019-12-27 NOTE — Progress Notes (Signed)
PT Cancellation Note  Patient Details Name: Claire West MRN: [id number] DOB: 1940-10-12   Cancelled Treatment:    Reason Eval/Treat Not Completed: Patient at procedure or test/unavailable(currently with echo)   Duncan Dull 12/27/2019, 9:51 AM Alben Deeds, PT DPT  Board Certified Neurologic Specialist [place] Office [phone]

## 2019-12-28 DIAGNOSIS — I69318 Other symptoms and signs involving cognitive functions following cerebral infarction: Secondary | ICD-10-CM | POA: Diagnosis not present

## 2019-12-28 DIAGNOSIS — I69398 Other sequelae of cerebral infarction: Secondary | ICD-10-CM | POA: Diagnosis not present

## 2019-12-28 DIAGNOSIS — E785 Hyperlipidemia, unspecified: Secondary | ICD-10-CM | POA: Diagnosis not present

## 2019-12-28 DIAGNOSIS — I10 Essential (primary) hypertension: Secondary | ICD-10-CM | POA: Diagnosis not present

## 2019-12-28 DIAGNOSIS — M6281 Muscle weakness (generalized): Secondary | ICD-10-CM | POA: Diagnosis not present

## 2019-12-28 DIAGNOSIS — G3184 Mild cognitive impairment, so stated: Secondary | ICD-10-CM | POA: Diagnosis not present

## 2019-12-28 DIAGNOSIS — M2550 Pain in unspecified joint: Secondary | ICD-10-CM | POA: Diagnosis not present

## 2019-12-28 DIAGNOSIS — F329 Major depressive disorder, single episode, unspecified: Secondary | ICD-10-CM

## 2019-12-28 DIAGNOSIS — J302 Other seasonal allergic rhinitis: Secondary | ICD-10-CM | POA: Diagnosis not present

## 2019-12-28 DIAGNOSIS — Z7401 Bed confinement status: Secondary | ICD-10-CM | POA: Diagnosis not present

## 2019-12-28 DIAGNOSIS — R5381 Other malaise: Secondary | ICD-10-CM | POA: Diagnosis not present

## 2019-12-28 DIAGNOSIS — F0150 Vascular dementia without behavioral disturbance: Secondary | ICD-10-CM

## 2019-12-28 DIAGNOSIS — F419 Anxiety disorder, unspecified: Secondary | ICD-10-CM | POA: Diagnosis not present

## 2019-12-28 DIAGNOSIS — K219 Gastro-esophageal reflux disease without esophagitis: Secondary | ICD-10-CM | POA: Diagnosis not present

## 2019-12-28 DIAGNOSIS — M179 Osteoarthritis of knee, unspecified: Secondary | ICD-10-CM | POA: Diagnosis not present

## 2019-12-28 DIAGNOSIS — B029 Zoster without complications: Secondary | ICD-10-CM | POA: Diagnosis not present

## 2019-12-28 DIAGNOSIS — F29 Unspecified psychosis not due to a substance or known physiological condition: Secondary | ICD-10-CM | POA: Diagnosis not present

## 2019-12-28 DIAGNOSIS — I63531 Cerebral infarction due to unspecified occlusion or stenosis of right posterior cerebral artery: Secondary | ICD-10-CM | POA: Diagnosis not present

## 2019-12-28 DIAGNOSIS — Z8673 Personal history of transient ischemic attack (TIA), and cerebral infarction without residual deficits: Secondary | ICD-10-CM | POA: Diagnosis not present

## 2019-12-28 DIAGNOSIS — R278 Other lack of coordination: Secondary | ICD-10-CM | POA: Diagnosis not present

## 2019-12-28 DIAGNOSIS — Z85828 Personal history of other malignant neoplasm of skin: Secondary | ICD-10-CM | POA: Diagnosis not present

## 2019-12-28 DIAGNOSIS — Z9181 History of falling: Secondary | ICD-10-CM | POA: Diagnosis not present

## 2019-12-28 DIAGNOSIS — Z03818 Encounter for observation for suspected exposure to other biological agents ruled out: Secondary | ICD-10-CM | POA: Diagnosis not present

## 2019-12-28 MED ORDER — ASPIRIN 325 MG PO TBEC: 325.0000 mg | DELAYED_RELEASE_TABLET | Freq: Every day | ORAL | 0 refills | Status: DC

## 2019-12-28 MED ORDER — ALPRAZOLAM 0.5 MG PO TABS: 0.5000 mg | ORAL_TABLET | Freq: Every evening | ORAL | 0 refills | Status: DC | PRN

## 2019-12-28 MED ORDER — CLOPIDOGREL BISULFATE 75 MG PO TABS: 75.0000 mg | ORAL_TABLET | Freq: Every day | ORAL | 0 refills | Status: DC

## 2019-12-28 MED ORDER — IRBESARTAN 150 MG PO TABS: 150.0000 mg | ORAL_TABLET | Freq: Every day | ORAL | 0 refills | Status: DC

## 2019-12-28 MED ORDER — ATORVASTATIN CALCIUM 40 MG PO TABS: 40.0000 mg | ORAL_TABLET | Freq: Every day | ORAL | 0 refills | Status: DC

## 2019-12-28 NOTE — Progress Notes (Signed)
Physical Therapy Treatment Patient Details Name: Claire West MRN: [id number] DOB: 11-21-1940 Today's Date: 12/28/2019    History of Present Illness Pt is a 79 year old female who presented to [facility] with blurred vision and burning in her legs. Imaging: right PCA occlusion with parietal-occipital brain right hippocampus and small focus of right lateral thalamus. She was transfered to [facility] for further evaluation. Family reports weakness, visual changes and increased assist from caregivers. PMHx: HTN, depression.    PT Comments    Pt in bed upon arrival of PT with son present, agreeable to limited PT session due to fatigue and nausea today. The pt continues to present with limitations in functional mobility, dynamic stability, and activity tolerance compared to their prior level of function and independence due to above dx. This is further complicated by increased lethargy and cog deficits that limit safety awareness and decision making, so the pt is at sig increased risk of falls. The pt will continue to benefit from skilled PT to further progress stability and functional strength in order to progress independence with transfers as well as to increase ambulation tolerance.     Follow Up Recommendations  SNF(given pt's current presentation, recommend SNF level rehab, possible progression to home with HHPT)     Equipment Recommendations  Other (comment)(defer to post acute)    Recommendations for Other Services       Precautions / Restrictions Precautions Precautions: Fall Restrictions Weight Bearing Restrictions: No    Mobility  Bed Mobility Overal bed mobility: Needs Assistance Bed Mobility: Supine to Sit     Supine to sit: Min guard     General bed mobility comments: pt able to come to sitting without assist +rails and extra time  Transfers Overall transfer level: Needs assistance Equipment used: Rolling walker (2 wheeled) Transfers: Sit to/from Stand Sit to  Stand: Min assist         General transfer comment: minA for power-up and stability with initial stance  Ambulation/Gait Ambulation/Gait assistance: Min assist Gait Distance (Feet): 15 Feet(pt declined further ambulation due to nausea) Assistive device: Rolling walker (2 wheeled) Gait Pattern/deviations: Shuffle Gait velocity: decreased  Gait velocity interpretation: <1.31 ft/sec, indicative of household ambulator General Gait Details: VCs for upright posture and positioning, min assist for stability. Session with gait focus on navigation and visual attentio to environment   Stairs             Wheelchair Mobility    Modified Rankin (Stroke Patients Only) Modified Rankin (Stroke Patients Only) Pre-Morbid Rankin Score: No symptoms Modified Rankin: Moderately severe disability     Balance Overall balance assessment: Needs assistance Sitting-balance support: Bilateral upper extremity supported Sitting balance-Leahy Scale: Fair Sitting balance - Comments: min guard    Standing balance support: Bilateral upper extremity supported Standing balance-Leahy Scale: Poor Standing balance comment: VCs for upright posture and positioning                            Cognition Arousal/Alertness: Lethargic Behavior During Therapy: Flat affect Overall Cognitive Status: Impaired/Different from baseline Area of Impairment: Safety/judgement;Following commands;Problem solving;Awareness;Memory                     Memory: Decreased short-term memory(asked multiple times about the plan for PT session) Following Commands: Follows one step commands with increased time;Follows one step commands inconsistently Safety/Judgement: Decreased awareness of safety;Decreased awareness of deficits Awareness: Intellectual Problem Solving: Slow processing;Decreased initiation;Difficulty  sequencing;Requires verbal cues;Requires tactile cues General Comments: Pt followed commands with  increased time. Pt requiring assist for safety awareness and to increase awareness of deficits.      Exercises      General Comments General comments (skin integrity, edema, etc.): Pt reports no pain, but appears uncomfortable throughout, pt reports nausea and fatigue, sitting with head in sig cervical flexion despite verbal cues      Pertinent Vitals/Pain Pain Assessment: Faces Faces Pain Scale: Hurts a little bit Pain Location: grimacing and moaning, pt reporting no pain, unable to vocalize cause of discomfort Pain Descriptors / Indicators: Discomfort;Grimacing Pain Intervention(s): Limited activity within patient's tolerance;Monitored during session;Other (comment)(notified RN of nausea)    Home Living                      Prior Function            PT Goals (current goals can now be found in the care plan section) Acute Rehab PT Goals Patient Stated Goal: unable to state PT Goal Formulation: With patient/family Time For Goal Achievement: 01/02/20 Potential to Achieve Goals: Good Progress towards PT goals: Progressing toward goals    Frequency    Min 4X/week      PT Plan Current plan remains appropriate    Co-evaluation              AM-PAC PT "6 Clicks" Mobility   Outcome Measure  Help needed turning from your back to your side while in a flat bed without using bedrails?: A Little Help needed moving from lying on your back to sitting on the side of a flat bed without using bedrails?: A Little Help needed moving to and from a bed to a chair (including a wheelchair)?: A Little Help needed standing up from a chair using your arms (e.g., wheelchair or bedside chair)?: A Little Help needed to walk in hospital room?: A Little Help needed climbing 3-5 steps with a railing? : A Lot 6 Click Score: 17    End of Session Equipment Utilized During Treatment: Gait belt Activity Tolerance: Patient limited by fatigue;Other (comment)(pt limited by  nausea) Patient left: in chair;with call bell/phone within reach;with chair alarm set;with family/visitor present Nurse Communication: Mobility status(pt reports nausea, limited session) PT Visit Diagnosis: Unsteadiness on feet (R26.81);Other abnormalities of gait and mobility (R26.89);Muscle weakness (generalized) (M62.81)     Time: [id number] PT Time Calculation (min) (ACUTE ONLY): 20 min  Charges:  $Gait Training: 8-22 mins                     Karma Ganja, PT, DPT   Acute Rehabilitation Department Pager #: [phone]   Otho Bellows 12/28/2019, 1:42 PM

## 2019-12-28 NOTE — NC FL2 (Signed)
[place] MEDICAID FL2 LEVEL OF CARE SCREENING TOOL     IDENTIFICATION  Patient Name: Claire West Birthdate: 05/21/1941 Sex: female Admission Date (Current Location): 12/25/2019  [facility] and [place] Number:  [employer] and Address:  [place], [place] [address] [place], [place] [address]      Provider Number: [id number]  Attending Physician Name and Address:  Karie Kirks, DO  Relative Name and Phone Number:       Current Level of Care: Hospital Recommended Level of Care: [place] Prior Approval Number:    Date Approved/Denied:   PASRR Number: [id number]  Discharge Plan: SNF    Current Diagnoses: Patient Active Problem List   Diagnosis Date Noted  . Acute CVA (cerebrovascular accident) ([place]) 12/25/2019  . Restlessness and agitation 12/17/2019  . Vascular dementia with behavior disturbance ([place]) 12/17/2019  . Mild cognitive impairment 06/25/2019  . Depression 06/25/2019  . Falls frequently 10/28/2018  . TIA (transient ischemic attack) 08/23/2018  . Delirium in remission 07/07/2018  . OA (osteoarthritis) of knee 05/05/2018  . Amnestic MCI (mild cognitive impairment with memory loss) 12/24/2017  . Acute metabolic encephalopathy [id number]  . Altered mental status 11/06/2017  . Hyponatremia 02/12/2016  . Hypokalemia 02/12/2016  . Dehydration 02/12/2016  . Elevated LFTs 02/12/2016  . HTN (hypertension) 02/12/2016  . GERD (gastroesophageal reflux disease) 02/12/2016    Orientation RESPIRATION BLADDER Height & Weight     Self  Normal Incontinent Weight: 143 lb (64.9 kg) Height:  5\' 6"  (167.6 cm)  BEHAVIORAL SYMPTOMS/MOOD NEUROLOGICAL BOWEL NUTRITION STATUS      Continent Diet(heart healthy)  AMBULATORY STATUS COMMUNICATION OF NEEDS Skin   Limited Assist Verbally Normal                       Personal Care Assistance Level of Assistance  Bathing, Feeding, Dressing Bathing Assistance: Limited  assistance Feeding assistance: Limited assistance Dressing Assistance: Limited assistance     Functional Limitations Info  Sight Sight Info: Impaired(wears glasses)        SPECIAL CARE FACTORS FREQUENCY  PT (By licensed PT), OT (By licensed OT), Speech therapy     PT Frequency: 5x/wk OT Frequency: 5x/wk     Speech Therapy Frequency: 5x/wk      Contractures Contractures Info: Not present    Additional Factors Info  Code Status, Allergies, Psychotropic Code Status Info: Full Allergies Info: Penicillins, Adhesive (Tape), Codeine, Neosporin (Neomycin-bacitracin Zn-polymyx), Sulfa Antibiotics Psychotropic Info: Lexapro 20mg  daily at bed         Current Medications (12/28/2019):  This is the current hospital active medication list Current Facility-Administered Medications  Medication Dose Route Frequency Provider Last Rate Last Admin  . acetaminophen (TYLENOL) tablet 650 mg  650 mg Oral Q4H PRN Emokpae, Ejiroghene E, MD   650 mg at 12/27/19 1857   Or  . acetaminophen (TYLENOL) 160 MG/5ML solution 650 mg  650 mg Per Tube Q4H PRN Emokpae, Ejiroghene E, MD       Or  . acetaminophen (TYLENOL) suppository 650 mg  650 mg Rectal Q4H PRN Emokpae, Ejiroghene E, MD      . aspirin EC tablet 325 mg  325 mg Oral Daily Rosalin Hawking, MD   325 mg at 12/28/19 0936  . atorvastatin (LIPITOR) tablet 40 mg  40 mg Oral q1800 Rosalin Hawking, MD   40 mg at 12/27/19 1809  . clopidogrel (PLAVIX) tablet 75 mg  75 mg Oral Daily Xu,  Jindong, MD   75 mg at 12/28/19 0936  . escitalopram (LEXAPRO) tablet 20 mg  20 mg Oral QHS Emokpae, Ejiroghene E, MD   20 mg at 12/27/19 2059  . heparin injection 5,000 Units  5,000 Units Subcutaneous Q8H Emokpae, Ejiroghene E, MD   5,000 Units at 12/28/19 0610  . hydrochlorothiazide (HYDRODIURIL) tablet 25 mg  25 mg Oral Daily Emokpae, Ejiroghene E, MD   25 mg at 12/28/19 0936  . irbesartan (AVAPRO) tablet 150 mg  150 mg Oral Daily Emokpae, Ejiroghene E, MD   150 mg at 12/28/19  0936  . metoprolol tartrate (LOPRESSOR) tablet 50 mg  50 mg Oral BID Emokpae, Ejiroghene E, MD   50 mg at 12/28/19 0936  . senna-docusate (Senokot-S) tablet 1 tablet  1 tablet Oral QHS PRN Emokpae, Ejiroghene E, MD         Discharge Medications: Please see discharge summary for a list of discharge medications.  Relevant Imaging Results:  Relevant Lab Results:   Additional Information SS#: [id number]  Geralynn Ochs, LCSW

## 2019-12-28 NOTE — Progress Notes (Signed)
IV and tele removed without complication. Report called to RN Rollene Fare at [facility]. Copy of AVS provided to son [name] per request. Pt discharged from facility via [facility]

## 2019-12-28 NOTE — Discharge Summary (Signed)
Physician Discharge Summary  Claire West [id number] DOB: [birth year] DOA: 12/25/2019  PCP: Sharilyn Sites, MD  Admit date: 12/25/2019 Discharge date: 12/28/2019  Recommendations for Outpatient Follow-up:  1. Discharge to SNF with PT/OT 2. Follow up with PCP in 7-10 days after discharge from SNF 3. Draw chemistry on 12/31/2019. Report results to  4. Follow up with neurology as outpatient.  Follow-up Information    Ward Givens, NP. Schedule an appointment as soon as possible for a visit in 4 week(s).   Specialty: Neurology Contact information: [address] [place] [address] [phone]          Discharge Diagnoses: Principal diagnosis is #1 1. Acute CVA of right PCA 2. Herpes Zoster infection - treatment completed 3. Vascular Dementia 4. Mild Cognitive deficits 5. Hypertension  Discharge Condition: Fair  Disposition: SNF  Diet recommendation: Heart healthy  Filed Weights   12/25/19 1134  Weight: 64.9 kg   History of present illness:   Claire West is a 79 y.o. female with medical history significant for vascular dementia, mild cognitive impairments, hypertension, depression.  Patient presented to the ED via EMS with reports of altered mental status over the past several days.  Patient has been repeating the same statements and questions over and over and patient is paranoid.  Patient also reports that over the past 3 days he feels that patient has some weakness in her left upper extremity.  He also reports vision change, as he has had to get very close to the patient for her to see.   Patient saw her neurologist 12/17/2019 with similar complaints of confusion disorientation and agitation.  Patient also had hep B zoster rash to her right trunk.  She was started on Namenda and also given a prescription for also given a prescription for antiviral-Valtrex . With persistent confusion Namenda was discontinued, patient was to complete her last dose of  antiviral today. On my evaluation, patient is awake and alert, answers simple questions appropriately, but unable to give me details. Patient has 24-hour daily assistance from an aide.  Ambulates with a walker at baseline.  Home medication lists 81 mg aspirin daily patient son thinks patient is compliant with this.   In the ED the blood pressure systolic was [id number] to [id number], temperature 99.7.  Unremarkable CBC, BNP.  Portable chest x-ray shows slight left base atelectasis.  Head CT was suggestive of acute infarct MRI was recommended.  Subsequent MRI brain MRA head showed an acute infarct in the right PCA territory with involvement of the parieto-occipital brain, right hippocampos and small focus of right lateral thalamus.  MRA markedly degraded by motion. [facility] to admit for further evaluation and management.  Hospital Course:  The patient was transferred to [facility] for admission for further evaluation and treatment. Lipid panel demonstrates LDL of 105. Hemoglobin A1c was 5.3. Thus far the patient has undergone CTA head and neck which demonstrated  1. Short segment occlusion of the distal P2 segment of the right PCA. 2. Moderate stenosis of the proximal P2 segment of the left PCA. 3. Moderate stenosis of the proximal left V4 segment of the posterior cerebral artery. 4. Bilateral cervical internal carotid artery fibromuscular Dysplasia.  Echocardiogram has demonstrated EF of 55-60% with normal function. No regional wall motion abnormalities. There is Grade I diastolic dysfunction. There is no intracardiac source of embolus seen.The patient has been evaluated by PT/OT. The recommendation is for SNF. TOC has been consulted to assist with placement.  Neurology has recommended discharging the patient on Aspirin 325 mg and Plavix 75 mg daily. For 3 months to be followed by plavix alone. Neurology has signed off. Pt is cleared for discharge to SNF.  Today's assessment: S: The patient  is resting comfortably. No new complaints. O: Vitals:  Vitals:   12/28/19 0939 12/28/19 1253  BP: (!) 151/100 (!) 152/80  Pulse: 84 80  Resp:  19  Temp:  97.9 F (36.6 C)  SpO2:  98%   Constitutional:  The patient is awake and alert. Mentation and speech are improved.  Eyes:   pupils and irises appear normal  Normal lids and conjunctivae  Vision remains impaired. Respiratory:   No increased work of breathing.  No wheezes, rales, or rhonchi  No tactile fremitus Cardiovascular:   Regular rate and rhythm  No murmurs, ectopy, or gallups.  No lateral PMI. No thrills. Abdomen:   Abdomen is soft, non-tender, non-distended  No hernias, masses, or organomegaly  Normoactive bowel sounds.  Musculoskeletal:   No cyanosis, clubbing, or edema Skin:   No rashes, lesions, ulcers  palpation of skin: no induration or nodules Neurologic:   CN 2-12 intact  Sensation all 4 extremities intact  Right lower extremity weakness  Visual deficits in right eye Psychiatric:   In good spirits.  Discharge Instructions  Discharge Instructions    Activity as tolerated - No restrictions   Complete by: As directed    Ambulatory referral to Neurology   Complete by: As directed    An appointment is requested in approximately: 4 weeks   Call MD for:   Complete by: As directed    Neurological changes.   Diet - low sodium heart healthy   Complete by: As directed    Discharge instructions   Complete by: As directed    Discharge to SNF with PT/OT Follow up with PCP in 7-10 days. Draw chemistry and report results to facility physician on 12/31/2019. Follow up with neurology as directed.   Increase activity slowly   Complete by: As directed      Allergies as of 12/28/2019      Reactions   Penicillins Anaphylaxis, Swelling, Rash   Oral rash/peeling Did it involve swelling of the face/tongue/throat, SOB, or low BP? Yes Did it involve sudden or severe rash/hives, skin peeling,  or any reaction on the inside of your mouth or nose? Yes Did you need to seek medical attention at a hospital or doctor's office? Yes When did it last happen? Approximately 2017 If all above answers are "NO", may proceed with cephalosporin use. **Has tolerated Rocephin, Vantin since 2017**   Adhesive [tape] Itching, Rash   Irritation at site   Codeine Hives, Rash   Neosporin [neomycin-bacitracin Zn-polymyx] Rash   Sulfa Antibiotics Hives, Rash      Medication List    STOP taking these medications   ClearLax 17 GM/SCOOP powder Generic drug: polyethylene glycol powder   Krill Oil 500 MG Caps   olmesartan 20 MG tablet Commonly known as: BENICAR Replaced by: irbesartan 150 MG tablet   OMEGA-3 FISH OIL PO   pravastatin 20 MG tablet Commonly known as: PRAVACHOL     TAKE these medications   acetaminophen 500 MG tablet Commonly known as: TYLENOL Take 500 mg by mouth every 4 (four) hours as needed for mild pain.   ALPRAZolam 0.5 MG tablet Commonly known as: XANAX Take 1 tablet (0.5 mg total) by mouth at bedtime as needed for anxiety.   aspirin 325  MG EC tablet Take 1 tablet (325 mg total) by mouth daily. Start taking on: December 29, 2019 What changed:   medication strength  how much to take  additional instructions   atorvastatin 40 MG tablet Commonly known as: LIPITOR Take 1 tablet (40 mg total) by mouth daily at 6 PM.   clopidogrel 75 MG tablet Commonly known as: PLAVIX Take 1 tablet (75 mg total) by mouth daily. Start taking on: December 29, 2019   escitalopram 10 MG tablet Commonly known as: LEXAPRO Take 20 mg by mouth at bedtime.   hydrochlorothiazide 25 MG tablet Commonly known as: HYDRODIURIL Take 25 mg by mouth daily.   hydrocortisone cream 1 % Apply 1 application topically 2 (two) times daily as needed for itching.   irbesartan 150 MG tablet Commonly known as: AVAPRO Take 1 tablet (150 mg total) by mouth daily. Start taking on: December 29, 2019 Replaces: olmesartan 20 MG tablet   memantine 5 MG tablet Commonly known as: Namenda Take 1 tablet (5 mg total) by mouth 2 (two) times daily.   metoprolol tartrate 50 MG tablet Commonly known as: LOPRESSOR Take 50 mg by mouth 2 (two) times daily.   MULTIVITAMIN ADULT PO Take 1 tablet by mouth daily at 12 noon.   omeprazole 20 MG capsule Commonly known as: PRILOSEC Take 20-40 mg by mouth See admin instructions. Alternates taking 40mg  daily with 20mg    potassium chloride 10 MEQ tablet Commonly known as: KLOR-CON Take 1 tablet (10 mEq total) by mouth daily. What changed: when to take this   triamcinolone cream 0.1 % Commonly known as: KENALOG Apply 1 application topically 2 (two) times daily as needed (itching).      Allergies  Allergen Reactions  . Penicillins Anaphylaxis, Swelling and Rash    Oral rash/peeling Did it involve swelling of the face/tongue/throat, SOB, or low BP? Yes Did it involve sudden or severe rash/hives, skin peeling, or any reaction on the inside of your mouth or nose? Yes Did you need to seek medical attention at a hospital or doctor's office? Yes When did it last happen? Approximately 2017 If all above answers are "NO", may proceed with cephalosporin use. **Has tolerated Rocephin, Vantin since 2017**    . Adhesive [Tape] Itching and Rash    Irritation at site  . Codeine Hives and Rash  . Neosporin [Neomycin-Bacitracin Zn-Polymyx] Rash  . Sulfa Antibiotics Hives and Rash    The results of significant diagnostics from this hospitalization (including imaging, microbiology, ancillary and laboratory) are listed below for reference.    Significant Diagnostic Studies: CT ANGIO HEAD W OR WO CONTRAST  Result Date: 12/26/2019 CLINICAL DATA:  Vision change and altered mental status EXAM: CT ANGIOGRAPHY HEAD AND NECK TECHNIQUE: Multidetector CT imaging of the head and neck was performed using the standard protocol during bolus administration of  intravenous contrast. Multiplanar CT image reconstructions and MIPs were obtained to evaluate the vascular anatomy. Carotid stenosis measurements (when applicable) are obtained utilizing NASCET criteria, using the distal internal carotid diameter as the denominator. CONTRAST:  145mL OMNIPAQUE IOHEXOL 350 MG/ML SOLN COMPARISON:  None. FINDINGS: CT HEAD FINDINGS Brain: There is no mass, hemorrhage or extra-axial collection. The size and configuration of the ventricles and extra-axial CSF spaces are normal. Unchanged appearance of right occipital lobe infarct. There is hypoattenuation of the periventricular white matter, most commonly indicating chronic ischemic microangiopathy. Skull: The visualized skull base, calvarium and extracranial soft tissues are normal. Sinuses/Orbits: No fluid levels or advanced mucosal thickening  of the visualized paranasal sinuses. No mastoid or middle ear effusion. The orbits are normal. CTA NECK FINDINGS SKELETON: There is no bony spinal canal stenosis. No lytic or blastic lesion. OTHER NECK: Normal pharynx, larynx and major salivary glands. No cervical lymphadenopathy. Unremarkable thyroid gland. UPPER CHEST: No pneumothorax or pleural effusion. No nodules or masses. AORTIC ARCH: There is mild calcific atherosclerosis of the aortic arch. There is no aneurysm, dissection or hemodynamically significant stenosis of the visualized portion of the aorta. Conventional 3 vessel aortic branching pattern. The visualized proximal subclavian arteries are widely patent. RIGHT CAROTID SYSTEM: Multifocal undulation. No stenosis. No dissection or occlusion. LEFT CAROTID SYSTEM: As on the contralateral side, there is an area of undulation. No atherosclerosis, occlusion or stenosis. VERTEBRAL ARTERIES: Left dominant configuration. Both origins are clearly patent. There is no dissection, occlusion or flow-limiting stenosis to the skull base (V1-V3 segments). CTA HEAD FINDINGS POSTERIOR CIRCULATION:  --Vertebral arteries: Moderate stenosis of the proximal left V4 segment. Normal right. --Posterior inferior cerebellar arteries (PICA): Patent origins from the vertebral arteries. --Anterior inferior cerebellar arteries (AICA): Patent origins from the basilar artery. --Basilar artery: Normal. --Superior cerebellar arteries: Normal. --Posterior cerebral arteries: Short segment occlusion of the distal P2 segment of the right PCA. Moderate stenosis of the proximal P2 segment of the left PCA. ANTERIOR CIRCULATION: --Intracranial internal carotid arteries: Normal. --Anterior cerebral arteries (ACA): Normal. Absent right A1 segment, normal variant --Middle cerebral arteries (MCA): Normal. VENOUS SINUSES: As permitted by contrast timing, patent. ANATOMIC VARIANTS: None Review of the MIP images confirms the above findings. IMPRESSION: 1. Short segment occlusion of the distal P2 segment of the right PCA. 2. Moderate stenosis of the proximal P2 segment of the left PCA. 3. Moderate stenosis of the proximal left V4 segment of the posterior cerebral artery. 4. Bilateral cervical internal carotid artery fibromuscular dysplasia. Electronically Signed   By: Ulyses Jarred M.D.   On: 12/26/2019 00:12   CT Head Wo Contrast  Result Date: 12/25/2019 CLINICAL DATA:  Altered mental status. EXAM: CT HEAD WITHOUT CONTRAST TECHNIQUE: Contiguous axial images were obtained from the base of the skull through the vertex without intravenous contrast. COMPARISON:  June 24, 2019. FINDINGS: Brain: Mild chronic ischemic white matter disease is noted. New right occipital low density is noted most consistent with acute infarction. Ventricular size is within normal limits. No mass effect or midline shift is noted. There is no evidence of hemorrhage or mass lesion. Vascular: No hyperdense vessel or unexpected calcification. Skull: Normal. Negative for fracture or focal lesion. Sinuses/Orbits: No acute finding. Other: None. IMPRESSION: New right  occipital low density is noted most consistent with acute infarction. MRI is recommended for further evaluation. Electronically Signed   By: Marijo Conception M.D.   On: 12/25/2019 14:04   CT ANGIO NECK W OR WO CONTRAST  Result Date: 12/26/2019 CLINICAL DATA:  Vision change and altered mental status EXAM: CT ANGIOGRAPHY HEAD AND NECK TECHNIQUE: Multidetector CT imaging of the head and neck was performed using the standard protocol during bolus administration of intravenous contrast. Multiplanar CT image reconstructions and MIPs were obtained to evaluate the vascular anatomy. Carotid stenosis measurements (when applicable) are obtained utilizing NASCET criteria, using the distal internal carotid diameter as the denominator. CONTRAST:  170mL OMNIPAQUE IOHEXOL 350 MG/ML SOLN COMPARISON:  None. FINDINGS: CT HEAD FINDINGS Brain: There is no mass, hemorrhage or extra-axial collection. The size and configuration of the ventricles and extra-axial CSF spaces are normal. Unchanged appearance of right occipital lobe infarct.  There is hypoattenuation of the periventricular white matter, most commonly indicating chronic ischemic microangiopathy. Skull: The visualized skull base, calvarium and extracranial soft tissues are normal. Sinuses/Orbits: No fluid levels or advanced mucosal thickening of the visualized paranasal sinuses. No mastoid or middle ear effusion. The orbits are normal. CTA NECK FINDINGS SKELETON: There is no bony spinal canal stenosis. No lytic or blastic lesion. OTHER NECK: Normal pharynx, larynx and major salivary glands. No cervical lymphadenopathy. Unremarkable thyroid gland. UPPER CHEST: No pneumothorax or pleural effusion. No nodules or masses. AORTIC ARCH: There is mild calcific atherosclerosis of the aortic arch. There is no aneurysm, dissection or hemodynamically significant stenosis of the visualized portion of the aorta. Conventional 3 vessel aortic branching pattern. The visualized proximal  subclavian arteries are widely patent. RIGHT CAROTID SYSTEM: Multifocal undulation. No stenosis. No dissection or occlusion. LEFT CAROTID SYSTEM: As on the contralateral side, there is an area of undulation. No atherosclerosis, occlusion or stenosis. VERTEBRAL ARTERIES: Left dominant configuration. Both origins are clearly patent. There is no dissection, occlusion or flow-limiting stenosis to the skull base (V1-V3 segments). CTA HEAD FINDINGS POSTERIOR CIRCULATION: --Vertebral arteries: Moderate stenosis of the proximal left V4 segment. Normal right. --Posterior inferior cerebellar arteries (PICA): Patent origins from the vertebral arteries. --Anterior inferior cerebellar arteries (AICA): Patent origins from the basilar artery. --Basilar artery: Normal. --Superior cerebellar arteries: Normal. --Posterior cerebral arteries: Short segment occlusion of the distal P2 segment of the right PCA. Moderate stenosis of the proximal P2 segment of the left PCA. ANTERIOR CIRCULATION: --Intracranial internal carotid arteries: Normal. --Anterior cerebral arteries (ACA): Normal. Absent right A1 segment, normal variant --Middle cerebral arteries (MCA): Normal. VENOUS SINUSES: As permitted by contrast timing, patent. ANATOMIC VARIANTS: None Review of the MIP images confirms the above findings. IMPRESSION: 1. Short segment occlusion of the distal P2 segment of the right PCA. 2. Moderate stenosis of the proximal P2 segment of the left PCA. 3. Moderate stenosis of the proximal left V4 segment of the posterior cerebral artery. 4. Bilateral cervical internal carotid artery fibromuscular dysplasia. Electronically Signed   By: Ulyses Jarred M.D.   On: 12/26/2019 00:12   MR ANGIO HEAD WO CONTRAST  Result Date: 12/25/2019 CLINICAL DATA:  Altered mental status. Dementia. New right posterior brain stroke seen by CT. EXAM: MRI HEAD WITHOUT CONTRAST MRA HEAD WITHOUT CONTRAST TECHNIQUE: Multiplanar, multiecho pulse sequences of the brain and  surrounding structures were obtained without intravenous contrast. Angiographic images of the head were obtained using MRA technique without contrast. COMPARISON:  Head CT earlier same day.  MRI 06/25/2019. FINDINGS: MRI HEAD FINDINGS Brain: Acute infarction in the right posterior cerebral artery territory affecting the parieto-occipital brain, the right hippocampus, and with punctate involvement of the right lateral thalamus. Mild swelling but no gross hemorrhage. No other vascular distribution insult is identified acutely. Elsewhere, there chronic small-vessel ischemic changes of the pons. Old small vessel cerebellar insults. Cerebral hemispheres otherwise show patchy and confluent chronic small vessel disease throughout the hemispheric white matter and old small vessel infarction in the right thalamus and the basal ganglia. No mass, hemorrhage, hydrocephalus or extra-axial collection. Vascular: Major vessels at the base of the brain show flow. Skull and upper cervical spine: Negative Sinuses/Orbits: Clear/normal Other: None MRA HEAD FINDINGS The study suffers from motion degradation. Both internal carotid arteries show flow into the brain. Both anterior and middle cerebral arteries show flow. Diminutive A1 segment on the right with the anterior cerebral arteries receiving most of there supply from the left carotid circulation.  Both vertebral arteries show antegrade flow to the basilar. The basilar artery shows flow. Flow is present within the proximal superior cerebellar and posterior cerebral arteries. Distal vessel evaluation is not possible because of motion degradation. IMPRESSION: Acute infarction in the right PCA territory with involvement of the parieto-occipital brain, right hippocampus and small focus of the right lateral thalamus. Mild swelling but no evidence of hemorrhage. Extensive chronic ischemic changes elsewhere throughout the brain as outlined above. Major vessels at the base of the brain show  flow. The MR angiogram is markedly degraded by motion. There is flow at least in the proximal aspect of the right PCA. The distal branch vessels are not evaluated because of motion degradation Electronically Signed   By: Nelson Chimes M.D.   On: 12/25/2019 16:18   MR Brain Wo Contrast (neuro protocol)  Result Date: 12/25/2019 CLINICAL DATA:  Altered mental status. Dementia. New right posterior brain stroke seen by CT. EXAM: MRI HEAD WITHOUT CONTRAST MRA HEAD WITHOUT CONTRAST TECHNIQUE: Multiplanar, multiecho pulse sequences of the brain and surrounding structures were obtained without intravenous contrast. Angiographic images of the head were obtained using MRA technique without contrast. COMPARISON:  Head CT earlier same day.  MRI 06/25/2019. FINDINGS: MRI HEAD FINDINGS Brain: Acute infarction in the right posterior cerebral artery territory affecting the parieto-occipital brain, the right hippocampus, and with punctate involvement of the right lateral thalamus. Mild swelling but no gross hemorrhage. No other vascular distribution insult is identified acutely. Elsewhere, there chronic small-vessel ischemic changes of the pons. Old small vessel cerebellar insults. Cerebral hemispheres otherwise show patchy and confluent chronic small vessel disease throughout the hemispheric white matter and old small vessel infarction in the right thalamus and the basal ganglia. No mass, hemorrhage, hydrocephalus or extra-axial collection. Vascular: Major vessels at the base of the brain show flow. Skull and upper cervical spine: Negative Sinuses/Orbits: Clear/normal Other: None MRA HEAD FINDINGS The study suffers from motion degradation. Both internal carotid arteries show flow into the brain. Both anterior and middle cerebral arteries show flow. Diminutive A1 segment on the right with the anterior cerebral arteries receiving most of there supply from the left carotid circulation. Both vertebral arteries show antegrade flow to  the basilar. The basilar artery shows flow. Flow is present within the proximal superior cerebellar and posterior cerebral arteries. Distal vessel evaluation is not possible because of motion degradation. IMPRESSION: Acute infarction in the right PCA territory with involvement of the parieto-occipital brain, right hippocampus and small focus of the right lateral thalamus. Mild swelling but no evidence of hemorrhage. Extensive chronic ischemic changes elsewhere throughout the brain as outlined above. Major vessels at the base of the brain show flow. The MR angiogram is markedly degraded by motion. There is flow at least in the proximal aspect of the right PCA. The distal branch vessels are not evaluated because of motion degradation Electronically Signed   By: Nelson Chimes M.D.   On: 12/25/2019 16:18   DG Chest Port 1 View  Result Date: 12/25/2019 CLINICAL DATA:  Altered mental status.  Hypertension. EXAM: PORTABLE CHEST 1 VIEW COMPARISON:  June 24, 2019 FINDINGS: There is slight atelectasis in the left base. The lungs elsewhere are clear. Heart size and pulmonary vascularity are normal. No adenopathy. There is aortic atherosclerosis. There are breast implants bilaterally. There is midthoracic levoscoliosis. IMPRESSION: Slight left base atelectasis. Lungs elsewhere clear. Stable cardiac silhouette. No adenopathy. Breast implants bilaterally. Aortic Atherosclerosis (ICD10-I70.0). Electronically Signed   By: Lowella Grip III M.D.  On: 12/25/2019 13:19   ECHOCARDIOGRAM COMPLETE  Result Date: 12/27/2019    ECHOCARDIOGRAM REPORT   Patient Name:   SHAWONNA DRENNER Date of Exam: 12/27/2019 Medical Rec #:  [id number]        Height:       66.0 in Accession #:    [id number]       Weight:       143.0 lb Date of Birth:  [birth year]        BSA:          1.734 m Patient Age:    68 years         BP:           197/77 mmHg Patient Gender: F                HR:           80 bpm. Exam Location:  Inpatient Procedure: 2D  Echo, Color Doppler and Cardiac Doppler Indications:    Stroke i163.9  History:        Patient has prior history of Echocardiogram examinations, most                 recent 08/23/2018. Risk Factors:Hypertension and Dyslipidemia.  Sonographer:    Raquel Sarna Senior RDCS Referring Phys: [id number] Bethena Roys  Sonographer Comments: Technically difficult study due to poor echo windows. Patient did not tolerate procedure well. IMPRESSIONS  1. Left ventricular ejection fraction, by estimation, is 55 to 60%. The left ventricle has normal function. The left ventricle has no regional wall motion abnormalities. Left ventricular diastolic parameters are consistent with Grade I diastolic dysfunction (impaired relaxation).  2. Right ventricular systolic function is normal. The right ventricular size is normal. Tricuspid regurgitation signal is inadequate for assessing PA pressure.  3. The mitral valve is grossly normal. Trivial mitral valve regurgitation.  4. The aortic valve was not well visualized. Aortic valve regurgitation is mild. FINDINGS  Left Ventricle: Left ventricular ejection fraction, by estimation, is 55 to 60%. The left ventricle has normal function. The left ventricle has no regional wall motion abnormalities. The left ventricular internal cavity size was normal in size. There is  borderline left ventricular hypertrophy. Left ventricular diastolic parameters are consistent with Grade I diastolic dysfunction (impaired relaxation). Right Ventricle: The right ventricular size is normal. No increase in right ventricular wall thickness. Right ventricular systolic function is normal. Tricuspid regurgitation signal is inadequate for assessing PA pressure. Left Atrium: Left atrial size was normal in size. Right Atrium: Right atrial size was normal in size. Pericardium: A small pericardial effusion is present. The pericardial effusion is anterior to the right ventricle. Mitral Valve: The mitral valve is grossly normal. There  is mild thickening of the mitral valve leaflet(s). Mild mitral annular calcification. Trivial mitral valve regurgitation. Tricuspid Valve: The tricuspid valve is not well visualized. Tricuspid valve regurgitation is trivial. Aortic Valve: The aortic valve was not well visualized. Aortic valve regurgitation is mild. Pulmonic Valve: The pulmonic valve was not well visualized. Pulmonic valve regurgitation is not visualized. Aorta: The aortic root is normal in size and structure. IAS/Shunts: No atrial level shunt detected by color flow Doppler.  LEFT VENTRICLE PLAX 2D LVIDd:         3.90 cm  Diastology LVIDs:         2.50 cm  LV e' lateral:   4.90 cm/s LV PW:         0.90 cm  LV E/e' lateral: 10.6 LV  IVS:        1.00 cm  LV e' medial:    3.92 cm/s LVOT diam:     2.00 cm  LV E/e' medial:  13.2 LV SV:         35 LV SV Index:   20 LVOT Area:     3.14 cm  RIGHT VENTRICLE RV S prime:     12.40 cm/s TAPSE (M-mode): 1.8 cm LEFT ATRIUM             Index       RIGHT ATRIUM          Index LA diam:        3.40 cm 1.96 cm/m  RA Area:     7.83 cm LA Vol (A2C):   42.7 ml 24.62 ml/m RA Volume:   10.90 ml 6.29 ml/m LA Vol (A4C):   35.7 ml 20.59 ml/m LA Biplane Vol: 42.1 ml 24.28 ml/m  AORTIC VALVE LVOT Vmax:   65.90 cm/s LVOT Vmean:  44.900 cm/s LVOT VTI:    0.112 m  AORTA Ao Root diam: 2.40 cm MITRAL VALVE MV Area (PHT): 3.03 cm    SHUNTS MV Decel Time: 250 msec    Systemic VTI:  0.11 m MV E velocity: 51.80 cm/s  Systemic Diam: 2.00 cm MV A velocity: 97.30 cm/s MV E/A ratio:  0.53 Rozann Lesches MD Electronically signed by Rozann Lesches MD Signature Date/Time: 12/27/2019/11:56:40 AM    Final    VAS [place] LOWER EXTREMITY VENOUS (DVT)  Result Date: 12/27/2019  Lower Venous DVTStudy Indications: Stroke.  Comparison Study: No prior exam. Performing Technologist: Baldwin Crown ARDMS, RVT  Examination Guidelines: A complete evaluation includes B-mode imaging, spectral Doppler, color Doppler, and power Doppler as needed of all  accessible portions of each vessel. Bilateral testing is considered an integral part of a complete examination. Limited examinations for reoccurring indications may be performed as noted. The reflux portion of the exam is performed with the patient in reverse Trendelenburg.  +---------+---------------+---------+-----------+----------+--------------+ RIGHT    CompressibilityPhasicitySpontaneityPropertiesThrombus Aging +---------+---------------+---------+-----------+----------+--------------+ CFV      Full           Yes      Yes                                 +---------+---------------+---------+-----------+----------+--------------+ SFJ      Full                                                        +---------+---------------+---------+-----------+----------+--------------+ FV Prox  Full                                                        +---------+---------------+---------+-----------+----------+--------------+ FV Mid   Full                                                        +---------+---------------+---------+-----------+----------+--------------+ FV DistalFull                                                        +---------+---------------+---------+-----------+----------+--------------+  PFV      Full                                                        +---------+---------------+---------+-----------+----------+--------------+ POP      Full           Yes      Yes                                 +---------+---------------+---------+-----------+----------+--------------+ PTV      Full                                                        +---------+---------------+---------+-----------+----------+--------------+ PERO     Full                                                        +---------+---------------+---------+-----------+----------+--------------+   +---------+---------------+---------+-----------+----------+--------------+  LEFT     CompressibilityPhasicitySpontaneityPropertiesThrombus Aging +---------+---------------+---------+-----------+----------+--------------+ CFV      Full           Yes      Yes                                 +---------+---------------+---------+-----------+----------+--------------+ SFJ      Full                                                        +---------+---------------+---------+-----------+----------+--------------+ FV Prox  Full                                                        +---------+---------------+---------+-----------+----------+--------------+ FV Mid   Full                                                        +---------+---------------+---------+-----------+----------+--------------+ FV DistalFull                                                        +---------+---------------+---------+-----------+----------+--------------+ PFV      Full                                                        +---------+---------------+---------+-----------+----------+--------------+  POP      Full           Yes      Yes                                 +---------+---------------+---------+-----------+----------+--------------+ PTV      Full                                                        +---------+---------------+---------+-----------+----------+--------------+ PERO     Full                                                        +---------+---------------+---------+-----------+----------+--------------+     Summary: BILATERAL: - No evidence of deep vein thrombosis seen in the lower extremities, bilaterally.  RIGHT: - No cystic structure found in the popliteal fossa.  LEFT: - No cystic structure found in the popliteal fossa.  *See table(s) above for measurements and observations. Electronically signed by Ruta Hinds MD on 12/27/2019 at 12:39:59 PM.    Final     Microbiology: Recent Results (from the past 240 hour(s))    SARS CORONAVIRUS 2 (TAT 6-24 HRS) Nasopharyngeal Nasopharyngeal Swab     Status: None   Collection Time: 12/25/19  4:37 PM   Specimen: Nasopharyngeal Swab  Result Value Ref Range Status   SARS Coronavirus 2 NEGATIVE NEGATIVE Final    Comment: (NOTE) SARS-CoV-2 target nucleic acids are NOT DETECTED. The SARS-CoV-2 RNA is generally detectable in upper and lower respiratory specimens during the acute phase of infection. Negative results do not preclude SARS-CoV-2 infection, do not rule out co-infections with other pathogens, and should not be used as the sole basis for treatment or other patient management decisions. Negative results must be combined with clinical observations, patient history, and epidemiological information. The expected result is Negative. Fact Sheet for Patients: SugarRoll.be Fact Sheet for Healthcare Providers: https://www.woods-mathews.com/ This test is not yet approved or cleared by the [place] FDA and  has been authorized for detection and/or diagnosis of SARS-CoV-2 by FDA under an Emergency Use Authorization (EUA). This EUA will remain  in effect (meaning this test can be used) for the duration of the COVID-19 declaration under Section 56 4(b)(1) of the Act, 21 U.S.C. section 360bbb-3(b)(1), unless the authorization is terminated or revoked sooner. Performed at [facility], [place] [address], [place], [place] [address]      Labs: Basic Metabolic Panel: Recent Labs  Lab 12/25/19 1302  NA 134*  K 3.9  CL 96*  CO2 29  GLUCOSE 99  BUN 10  CREATININE 0.61  CALCIUM 9.0   Liver Function Tests: Recent Labs  Lab 12/25/19 1302  AST 22  ALT 22  ALKPHOS 56  BILITOT 0.8  PROT 6.6  ALBUMIN 3.6   Recent Labs  Lab 12/25/19 1302  LIPASE 20   No results for input(s): AMMONIA in the last 168 hours. CBC: Recent Labs  Lab 12/25/19 1302  WBC 8.5  NEUTROABS 5.4  HGB 13.8  HCT 40.2  MCV 93.1  PLT  255   Cardiac Enzymes: No results  for input(s): CKTOTAL, CKMB, CKMBINDEX, TROPONINI in the last 168 hours. BNP: BNP (last 3 results) Recent Labs    12/25/19 1302  BNP 65.0    ProBNP (last 3 results) No results for input(s): PROBNP in the last 8760 hours.  CBG: No results for input(s): GLUCAP in the last 168 hours.  Active Problems:   Acute CVA (cerebrovascular accident) [facility])   Time coordinating discharge: 38 minutes  Signed:        Latasia Silberstein, DO [facility]  12/28/2019, 1:23 PM

## 2019-12-28 NOTE — TOC Transition Note (Signed)
Transition of Care [facility]) - CM/SW Discharge Note   Patient Details  Name: Claire West MRN: [id number] Date of Birth: 12/30/1940  Transition of Care [facility]) CM/SW Contact:  Geralynn Ochs, LCSW Phone Number: 12/28/2019, 2:12 PM   Clinical Narrative:   Nurse to call report to [phone]    Final next level of care: [place] Barriers to Discharge: Barriers Resolved   Patient Goals and CMS Choice Patient states their goals for this hospitalization and ongoing recovery are:: patient unable to state goals due to confusion CMS Medicare.gov Compare Post Acute Care list provided to:: Patient Represenative (must comment) Choice offered to / list presented to : [facility] POA / [place], Adult Children  Discharge Placement              Patient chooses bed at: [facility] Patient to be transferred to facility by: [place] Name of family member notified: [name] Patient and family notified of of transfer: 12/28/19  Discharge Plan and Services                                     Social Determinants of Health (SDOH) Interventions     Readmission Risk Interventions No flowsheet data found.

## 2019-12-29 ENCOUNTER — Other Ambulatory Visit: Payer: Self-pay | Admitting: *Deleted

## 2019-12-29 NOTE — Patient Outreach (Signed)
Screened for potential [facility] Care Management needs as a benefit of  [employer] Medicare.  Claire West is currently receiving skilled therapy at [facility].  Writer attended telephonic interdisciplinary team meeting to assess for disposition needs and transition plan for resident.   Facility reports they have had a care plan meeting with the family today. States the transition plan is to return home with 24/7 caregivers. Goal is to be able to ambulate with walker again. Reports member's son Claire West is primary contact.   Will continue to follow for potential [facility] Care Management services and plan outreach to member's son closer to SNF dc.   Claire Rolling, MSN-Ed, RN,BSN [place] Acute Care Coordinator [phone] [facility]) [phone]  (Toll free office)

## 2019-12-30 DIAGNOSIS — F0150 Vascular dementia without behavioral disturbance: Secondary | ICD-10-CM | POA: Diagnosis not present

## 2019-12-30 DIAGNOSIS — E785 Hyperlipidemia, unspecified: Secondary | ICD-10-CM | POA: Diagnosis not present

## 2019-12-30 DIAGNOSIS — I10 Essential (primary) hypertension: Secondary | ICD-10-CM | POA: Diagnosis not present

## 2019-12-30 DIAGNOSIS — I63531 Cerebral infarction due to unspecified occlusion or stenosis of right posterior cerebral artery: Secondary | ICD-10-CM | POA: Diagnosis not present

## 2019-12-31 DIAGNOSIS — R5381 Other malaise: Secondary | ICD-10-CM | POA: Diagnosis not present

## 2019-12-31 DIAGNOSIS — I10 Essential (primary) hypertension: Secondary | ICD-10-CM | POA: Diagnosis not present

## 2019-12-31 DIAGNOSIS — I63531 Cerebral infarction due to unspecified occlusion or stenosis of right posterior cerebral artery: Secondary | ICD-10-CM | POA: Diagnosis not present

## 2019-12-31 DIAGNOSIS — E785 Hyperlipidemia, unspecified: Secondary | ICD-10-CM | POA: Diagnosis not present

## 2020-01-06 DIAGNOSIS — I63531 Cerebral infarction due to unspecified occlusion or stenosis of right posterior cerebral artery: Secondary | ICD-10-CM | POA: Diagnosis not present

## 2020-01-06 DIAGNOSIS — E785 Hyperlipidemia, unspecified: Secondary | ICD-10-CM | POA: Diagnosis not present

## 2020-01-06 DIAGNOSIS — K219 Gastro-esophageal reflux disease without esophagitis: Secondary | ICD-10-CM | POA: Diagnosis not present

## 2020-01-06 DIAGNOSIS — I10 Essential (primary) hypertension: Secondary | ICD-10-CM | POA: Diagnosis not present

## 2020-01-13 DIAGNOSIS — I63531 Cerebral infarction due to unspecified occlusion or stenosis of right posterior cerebral artery: Secondary | ICD-10-CM | POA: Diagnosis not present

## 2020-01-13 DIAGNOSIS — I10 Essential (primary) hypertension: Secondary | ICD-10-CM | POA: Diagnosis not present

## 2020-01-13 DIAGNOSIS — E785 Hyperlipidemia, unspecified: Secondary | ICD-10-CM | POA: Diagnosis not present

## 2020-01-13 DIAGNOSIS — K219 Gastro-esophageal reflux disease without esophagitis: Secondary | ICD-10-CM | POA: Diagnosis not present

## 2020-01-14 ENCOUNTER — Other Ambulatory Visit: Payer: Self-pay | Admitting: *Deleted

## 2020-01-14 NOTE — Patient Outreach (Signed)
Late entry 01/13/20  Spoke with [facility] RN after her telephonic IDT meeting with [facility] facility.  Transition plan is for member to dc home on 01/15/20 with paid caregivers.   Will outreach to family to discuss [facility] services.   Marthenia Rolling, MSN-Ed, RN,BSN [place] Acute Care Coordinator [phone] [facility]) [phone]  (Toll free office)

## 2020-01-15 ENCOUNTER — Other Ambulatory Visit: Payer: Self-pay | Admitting: *Deleted

## 2020-01-15 DIAGNOSIS — I10 Essential (primary) hypertension: Secondary | ICD-10-CM

## 2020-01-15 NOTE — Patient Outreach (Signed)
[place] Coordinator follow-up  Mrs. Marner is slated for dc today from [place].   Telephone call made to her son [name] [facility] [phone]. Patient identifiers confirmed. [name] confirms that Mrs. Wiechmann is returning home today with 24/7 caregiver assistance and [place].   [name] states he lives nearby member. [name] affirms that he is primary contact at [phone]. States they have all the equipment that is needed and awaiting lift chair.   Explained [place] Management services. Also discussed outpatient palliative care services referral for ongoing goals of care discussions. [name] is agreeable to both [facility] RNCM and ACC palliative referral.   Will email [place] Management and writer contact information.  Mrs. Miron has a history of vascular dementia, mild cognitive impairments, HTN, depression, HLD.   Referral made for [facility] RNCM and [facility]  palliative care referral sent.   Marthenia Rolling, MSN-Ed, RN,BSN [place] Acute Care Coordinator [phone] [facility]) [phone]  (Toll free office)

## 2020-01-18 DIAGNOSIS — M1990 Unspecified osteoarthritis, unspecified site: Secondary | ICD-10-CM | POA: Diagnosis not present

## 2020-01-18 DIAGNOSIS — H547 Unspecified visual loss: Secondary | ICD-10-CM | POA: Diagnosis not present

## 2020-01-18 DIAGNOSIS — F0150 Vascular dementia without behavioral disturbance: Secondary | ICD-10-CM | POA: Diagnosis not present

## 2020-01-18 DIAGNOSIS — I10 Essential (primary) hypertension: Secondary | ICD-10-CM | POA: Diagnosis not present

## 2020-01-18 DIAGNOSIS — F329 Major depressive disorder, single episode, unspecified: Secondary | ICD-10-CM | POA: Diagnosis not present

## 2020-01-18 DIAGNOSIS — I69398 Other sequelae of cerebral infarction: Secondary | ICD-10-CM | POA: Diagnosis not present

## 2020-01-18 DIAGNOSIS — K219 Gastro-esophageal reflux disease without esophagitis: Secondary | ICD-10-CM | POA: Diagnosis not present

## 2020-01-18 DIAGNOSIS — F419 Anxiety disorder, unspecified: Secondary | ICD-10-CM | POA: Diagnosis not present

## 2020-01-18 DIAGNOSIS — I69334 Monoplegia of upper limb following cerebral infarction affecting left non-dominant side: Secondary | ICD-10-CM | POA: Diagnosis not present

## 2020-01-18 DIAGNOSIS — I69318 Other symptoms and signs involving cognitive functions following cerebral infarction: Secondary | ICD-10-CM | POA: Diagnosis not present

## 2020-01-18 DIAGNOSIS — E785 Hyperlipidemia, unspecified: Secondary | ICD-10-CM | POA: Diagnosis not present

## 2020-01-18 DIAGNOSIS — Z7982 Long term (current) use of aspirin: Secondary | ICD-10-CM | POA: Diagnosis not present

## 2020-01-18 DIAGNOSIS — Z7902 Long term (current) use of antithrombotics/antiplatelets: Secondary | ICD-10-CM | POA: Diagnosis not present

## 2020-01-19 ENCOUNTER — Telehealth: Payer: Self-pay | Admitting: Neurology

## 2020-01-19 NOTE — Telephone Encounter (Signed)
Claire West on DPR called needing to speak to RN about the pt's medication irbesartan (AVAPRO) 150 MG tablet She states this was given to her in the hospital. Pt has a hospital f/u scheduled with NP Please advise.

## 2020-01-20 ENCOUNTER — Other Ambulatory Visit: Payer: Self-pay | Admitting: *Deleted

## 2020-01-20 DIAGNOSIS — H547 Unspecified visual loss: Secondary | ICD-10-CM | POA: Diagnosis not present

## 2020-01-20 DIAGNOSIS — I10 Essential (primary) hypertension: Secondary | ICD-10-CM | POA: Diagnosis not present

## 2020-01-20 DIAGNOSIS — I69334 Monoplegia of upper limb following cerebral infarction affecting left non-dominant side: Secondary | ICD-10-CM | POA: Diagnosis not present

## 2020-01-20 DIAGNOSIS — I69318 Other symptoms and signs involving cognitive functions following cerebral infarction: Secondary | ICD-10-CM | POA: Diagnosis not present

## 2020-01-20 DIAGNOSIS — F0150 Vascular dementia without behavioral disturbance: Secondary | ICD-10-CM | POA: Diagnosis not present

## 2020-01-20 DIAGNOSIS — I69398 Other sequelae of cerebral infarction: Secondary | ICD-10-CM | POA: Diagnosis not present

## 2020-01-20 NOTE — Patient Outreach (Signed)
[facility]) Care Management  01/20/2020  TIFFANY KUZMENKO 18-Aug-1941 [id number]   Subjective: Telephone call to patient's son / designated party release [name] [place]) mobile number, no answer, left HIPAA compliant voicemail message, and requested call back.    Objective: Per KPN (Knowledge Performance Now, point of care tool) and chart review, patient hospitalized 12/25/2019 - 12/28/2019 for Acute cerebrovascular accident.     Patient has a history of vascular dementia, mild cognitive impairments, hypertension,herpes zoster infection, and depression.    Assessment: Received Medicare [place] Management Post Acute Care Coordinator transition of care referral on 01/18/2020.  Referral source: Marthenia Rolling.   Referral reason: Please assign to [place]. To discharge from [place] today 4/9. Will have [facility]. Will have 24/7 caregivers. Son [name] ([place]) primary contact [phone]. Agreeable to [facility] services and to [facility] palliative referral. ACC referral pending. Other Diagnosis: HTN, vascular dementia, mild cognitive impairment, HLD.  Transition of care follow up pending contact with patient's son / designated party release.     Plan: RNCM will send unsuccessful outreach letter, [facility] pamphlet, will call patient for 2nd telephone outreach attempt within 4 business days, transition of care follow up, and proceed with case closure, within 10 business days if no return call.      Henryk Ursin H. Annia Friendly, BSN, [place] Management [facility] Telephonic CM Phone: [phone] Fax: [phone]

## 2020-01-20 NOTE — Telephone Encounter (Signed)
Called Ms. Claire West back to discuss her concerns. She states that she was just calling as a FYI. She didn't need a call back. Confirmed apt in may is a follow up from hospital. She wanted to mention the BP med that was started in hospital, since starting that she has been itching. She plans to reach out and discuss that more with PCP. I told her I would note that information on our end. She was appreciative for the call back.

## 2020-01-21 DIAGNOSIS — I69318 Other symptoms and signs involving cognitive functions following cerebral infarction: Secondary | ICD-10-CM | POA: Diagnosis not present

## 2020-01-21 DIAGNOSIS — I69398 Other sequelae of cerebral infarction: Secondary | ICD-10-CM | POA: Diagnosis not present

## 2020-01-21 DIAGNOSIS — I10 Essential (primary) hypertension: Secondary | ICD-10-CM | POA: Diagnosis not present

## 2020-01-21 DIAGNOSIS — H547 Unspecified visual loss: Secondary | ICD-10-CM | POA: Diagnosis not present

## 2020-01-21 DIAGNOSIS — F0150 Vascular dementia without behavioral disturbance: Secondary | ICD-10-CM | POA: Diagnosis not present

## 2020-01-21 DIAGNOSIS — I69334 Monoplegia of upper limb following cerebral infarction affecting left non-dominant side: Secondary | ICD-10-CM | POA: Diagnosis not present

## 2020-01-22 ENCOUNTER — Other Ambulatory Visit: Payer: Self-pay | Admitting: *Deleted

## 2020-01-22 ENCOUNTER — Encounter: Payer: Self-pay | Admitting: *Deleted

## 2020-01-22 NOTE — Patient Outreach (Addendum)
[facility]) Care Management  01/22/2020  Claire West Mar 30, 1941 [id number]   Subjective: Received voicemail message from patient's son / designated party release Claire West), on 01/21/2020, states he is returning call, and requested call back.  Telephone call to patient's son/ designated party release  mobile number, spoke with son, stated patient's name, date of birth, and address.   Discussed [facility] Care Management Medicare Post Acute Care Coordinator Transition of care follow up referral, son voiced understanding, and is in agreement to follow up on patient's behalf.   Son states he remembers speaking with referral source.  Son states patient is doing much better at home than in the skilled nursing facility.   States patient is moving very slow, needs a lot encouragement to participate in activities of daily living due poor vision.   States patient has a follow up appointment with eye MD on 4/20/2021and with neurologist on 02/10/2020.  Discussed importance of hospital follow up with primary MD, patient voices understanding, and states he will follow up as appropriate.   Son will call to schedule appointment with primary MD after neuro follow up.  States he wants to get primary MD's thoughts on cause of stroke in relationship to new medications that were prescribed prior to last hospitalization after he discusses with neurologist.  Son states he is aware of signs/ symptoms to report, how to reach patient's provider if needed after hours, when to go to ED, and / or call 911.  Son states patient is able to manage some self care and has assistance as needed. Patient has 24 hour caregivers and is receiving home health services.  Patient son voices understanding of patient's medical diagnosis and treatment plan.  States he is accessing patient's Medicare benefits on patient's behalf, as needed via member services number on back of card.  Son states patient  does not have any education  material, disease management, disease monitoring, transportation, community resource, or pharmacy needs at this time. States he is very appreciative of the follow up, is in agreement to receive [place] Management information, and services on patient's behalf.  Son states he will be working, out of town next week (week of 01/25/2020), and requested call back after 02/01/2020.     Objective: Per KPN (Knowledge Performance Now, point of care tool) and chart review, patient hospitalized 12/25/2019 - 12/28/2019 for Acute cerebrovascular accident.     Patient has a history of vascular dementia, mild cognitive impairments, hypertension,herpes zoster infection, and depression.    Assessment: Received Medicare [place] Management Post Acute Care Coordinator transition of care referral on 01/18/2020.  Referral source: Marthenia Rolling.   Referral reason: Please assign to [place]. To discharge from [place] today 4/9. Will have [facility]. Will have 24/7 caregivers. Son Claire West ([place]) primary contact [phone]. Agreeable to [facility] services and to [facility] palliative referral. ACC referral pending. Other Diagnosis: HTN, vascular dementia, mild cognitive impairment, HLD.  Transition of care follow up week #1 completed and RNCM will follow up with patient's son for updates within 7 business days.     Plan: RNCM will send patient welcome outreach letter, welcome packet, consent, [facility] pamphlet, and magnet. RNCM will call patient's son for 2nd telephone outreach attempt within 7 business days, transition of care weekly follow up.     Chen Holzman H. Annia Friendly, BSN, [place] Management [facility] Telephonic CM Phone: [phone] Fax: [phone]

## 2020-01-25 DIAGNOSIS — I69318 Other symptoms and signs involving cognitive functions following cerebral infarction: Secondary | ICD-10-CM | POA: Diagnosis not present

## 2020-01-25 DIAGNOSIS — I69398 Other sequelae of cerebral infarction: Secondary | ICD-10-CM | POA: Diagnosis not present

## 2020-01-25 DIAGNOSIS — I69334 Monoplegia of upper limb following cerebral infarction affecting left non-dominant side: Secondary | ICD-10-CM | POA: Diagnosis not present

## 2020-01-25 DIAGNOSIS — F0150 Vascular dementia without behavioral disturbance: Secondary | ICD-10-CM | POA: Diagnosis not present

## 2020-01-25 DIAGNOSIS — H547 Unspecified visual loss: Secondary | ICD-10-CM | POA: Diagnosis not present

## 2020-01-25 DIAGNOSIS — I10 Essential (primary) hypertension: Secondary | ICD-10-CM | POA: Diagnosis not present

## 2020-01-26 ENCOUNTER — Telehealth: Payer: Self-pay | Admitting: *Deleted

## 2020-01-26 DIAGNOSIS — H43813 Vitreous degeneration, bilateral: Secondary | ICD-10-CM | POA: Diagnosis not present

## 2020-01-26 NOTE — Telephone Encounter (Signed)
Received a Palliative care referral for this patient. Called and spoke with patient's son, [name], to schedule a home visit. Explained to him the role of Palliative care. He is not currently at home with the patient at the moment, but will call me back once he has had a chance to look at her calendar. She is currently receiving PT/OT 3x/week. Jimmey Ralph my contact information for return call.

## 2020-01-27 ENCOUNTER — Telehealth: Payer: Self-pay | Admitting: *Deleted

## 2020-01-27 DIAGNOSIS — I69334 Monoplegia of upper limb following cerebral infarction affecting left non-dominant side: Secondary | ICD-10-CM | POA: Diagnosis not present

## 2020-01-27 DIAGNOSIS — I10 Essential (primary) hypertension: Secondary | ICD-10-CM | POA: Diagnosis not present

## 2020-01-27 DIAGNOSIS — I69398 Other sequelae of cerebral infarction: Secondary | ICD-10-CM | POA: Diagnosis not present

## 2020-01-27 DIAGNOSIS — H547 Unspecified visual loss: Secondary | ICD-10-CM | POA: Diagnosis not present

## 2020-01-27 DIAGNOSIS — I69318 Other symptoms and signs involving cognitive functions following cerebral infarction: Secondary | ICD-10-CM | POA: Diagnosis not present

## 2020-01-27 DIAGNOSIS — F0150 Vascular dementia without behavioral disturbance: Secondary | ICD-10-CM | POA: Diagnosis not present

## 2020-01-27 NOTE — Telephone Encounter (Addendum)
Received a voicemail from patient's relative, Rupert Stacks, in regards to a call I made to patient's son yesterday to schedule a Palliative care visit. Returned her call and visit is scheduled for 02/05/20 at 2:00pm.

## 2020-01-28 DIAGNOSIS — I69398 Other sequelae of cerebral infarction: Secondary | ICD-10-CM | POA: Diagnosis not present

## 2020-01-28 DIAGNOSIS — F0150 Vascular dementia without behavioral disturbance: Secondary | ICD-10-CM | POA: Diagnosis not present

## 2020-01-28 DIAGNOSIS — I69334 Monoplegia of upper limb following cerebral infarction affecting left non-dominant side: Secondary | ICD-10-CM | POA: Diagnosis not present

## 2020-01-28 DIAGNOSIS — H547 Unspecified visual loss: Secondary | ICD-10-CM | POA: Diagnosis not present

## 2020-01-28 DIAGNOSIS — I10 Essential (primary) hypertension: Secondary | ICD-10-CM | POA: Diagnosis not present

## 2020-01-28 DIAGNOSIS — I69318 Other symptoms and signs involving cognitive functions following cerebral infarction: Secondary | ICD-10-CM | POA: Diagnosis not present

## 2020-01-29 DIAGNOSIS — F0150 Vascular dementia without behavioral disturbance: Secondary | ICD-10-CM | POA: Diagnosis not present

## 2020-01-29 DIAGNOSIS — I69334 Monoplegia of upper limb following cerebral infarction affecting left non-dominant side: Secondary | ICD-10-CM | POA: Diagnosis not present

## 2020-01-29 DIAGNOSIS — I69318 Other symptoms and signs involving cognitive functions following cerebral infarction: Secondary | ICD-10-CM | POA: Diagnosis not present

## 2020-01-29 DIAGNOSIS — I69398 Other sequelae of cerebral infarction: Secondary | ICD-10-CM | POA: Diagnosis not present

## 2020-01-29 DIAGNOSIS — H547 Unspecified visual loss: Secondary | ICD-10-CM | POA: Diagnosis not present

## 2020-01-29 DIAGNOSIS — I10 Essential (primary) hypertension: Secondary | ICD-10-CM | POA: Diagnosis not present

## 2020-02-01 ENCOUNTER — Ambulatory Visit: Payer: Self-pay | Admitting: *Deleted

## 2020-02-01 ENCOUNTER — Telehealth: Payer: Self-pay | Admitting: Adult Health

## 2020-02-01 DIAGNOSIS — F0150 Vascular dementia without behavioral disturbance: Secondary | ICD-10-CM | POA: Diagnosis not present

## 2020-02-01 DIAGNOSIS — I69318 Other symptoms and signs involving cognitive functions following cerebral infarction: Secondary | ICD-10-CM | POA: Diagnosis not present

## 2020-02-01 DIAGNOSIS — I69334 Monoplegia of upper limb following cerebral infarction affecting left non-dominant side: Secondary | ICD-10-CM | POA: Diagnosis not present

## 2020-02-01 DIAGNOSIS — H547 Unspecified visual loss: Secondary | ICD-10-CM | POA: Diagnosis not present

## 2020-02-01 DIAGNOSIS — I10 Essential (primary) hypertension: Secondary | ICD-10-CM | POA: Diagnosis not present

## 2020-02-01 DIAGNOSIS — I69398 Other sequelae of cerebral infarction: Secondary | ICD-10-CM | POA: Diagnosis not present

## 2020-02-01 NOTE — Telephone Encounter (Signed)
I just need her to be the last patient of the day- she needs time.

## 2020-02-01 NOTE — Telephone Encounter (Signed)
I called patient regarding rescheduling her 5/5 hospital follow-up with [facility]. Appointment notes say that per Dr. Erlinda Hong, patient should follow up with [facility]. I spoke with patient's daughter who states that the family has already stated that they did not want patient to see anyone other than Dr. Brett Fairy. I advised them that I would have to let NP and MD discuss and someone will call them to reschedule.

## 2020-02-01 NOTE — Telephone Encounter (Signed)
It's OK - I will see her.

## 2020-02-02 ENCOUNTER — Other Ambulatory Visit: Payer: Self-pay | Admitting: *Deleted

## 2020-02-02 DIAGNOSIS — F0150 Vascular dementia without behavioral disturbance: Secondary | ICD-10-CM | POA: Diagnosis not present

## 2020-02-02 DIAGNOSIS — I10 Essential (primary) hypertension: Secondary | ICD-10-CM | POA: Diagnosis not present

## 2020-02-02 DIAGNOSIS — I69318 Other symptoms and signs involving cognitive functions following cerebral infarction: Secondary | ICD-10-CM | POA: Diagnosis not present

## 2020-02-02 DIAGNOSIS — H547 Unspecified visual loss: Secondary | ICD-10-CM | POA: Diagnosis not present

## 2020-02-02 DIAGNOSIS — I69398 Other sequelae of cerebral infarction: Secondary | ICD-10-CM | POA: Diagnosis not present

## 2020-02-02 DIAGNOSIS — I69334 Monoplegia of upper limb following cerebral infarction affecting left non-dominant side: Secondary | ICD-10-CM | POA: Diagnosis not present

## 2020-02-02 NOTE — Patient Outreach (Addendum)
[facility]) Care Management  [place]  02/02/2020   Claire West [birth year] [id number]  Subjective: Telephone call to patient's son/ designated party release [name] Don[place]ant),  mobile number, spoke with son, stated patient's name, date of birth, and address.  States patient is doing fair, legally blind due to stroke, loss of peripheral vision in both eyes, and requires 24 hour supervisions.   States patient had eye MD follow up visit on 01/26/2020, visit went well, no new glasses needed, provider states patient's vision may, or may not improve.  States neurologist appointment was cancelled by provider and will be rescheduled with patient's primary neurologist when appointment becomes available.  States patient continued to participate in home physical therapy and will discuss extending home health at next primary MD follow up appointment.  Son states patient has a follow up visit with primary MD on 02/08/2020 and a dentist appointment next week (unsure of the date).  Son states patient  does not have any Air traffic controller, transportation, Data processing manager, or pharmacy needs at this time. States he is very appreciative of the follow up and is in agreement to continue to receive [place] [facility] services on patient's behalf.     Objective: Per KPN (Knowledge Performance Now, point of care tool) and chart review,patient hospitalized 12/25/2019 - 12/28/2019 forAcute cerebrovascular accident. Patient has a history of vascular dementia, mild cognitive impairments, hypertension,herpes zoster infection, anddepression.    Encounter Medications:  Outpatient Encounter Medications as of 02/02/2020  Medication Sig Note  . acetaminophen (TYLENOL) 500 MG tablet Take 500 mg by mouth every 4 (four) hours as needed for mild pain.    Marland Kitchen ALPRAZolam (XANAX) 0.5 MG tablet Take 1 tablet (0.5 mg total) by mouth at bedtime as needed for anxiety.   Marland Kitchen aspirin EC 325 MG EC tablet Take 1  tablet (325 mg total) by mouth daily.   Marland Kitchen atorvastatin (LIPITOR) 40 MG tablet Take 1 tablet (40 mg total) by mouth daily at 6 PM.   . clopidogrel (PLAVIX) 75 MG tablet Take 1 tablet (75 mg total) by mouth daily.   Marland Kitchen escitalopram (LEXAPRO) 10 MG tablet Take 20 mg by mouth at bedtime.   . hydrochlorothiazide (HYDRODIURIL) 25 MG tablet Take 25 mg by mouth daily.   . hydrocortisone cream 1 % Apply 1 application topically 2 (two) times daily as needed for itching.   . irbesartan (AVAPRO) 150 MG tablet Take 1 tablet (150 mg total) by mouth daily.   . memantine (NAMENDA) 5 MG tablet Take 1 tablet (5 mg total) by mouth 2 (two) times daily. (Patient not taking: Reported on 01/22/2020) 12/25/2019: On hold as of 3/17 per PCP  . metoprolol tartrate (LOPRESSOR) 50 MG tablet Take 50 mg by mouth 2 (two) times daily.   . Multiple Vitamin (MULTIVITAMIN ADULT PO) Take 1 tablet by mouth daily at 12 noon.   Marland Kitchen omeprazole (PRILOSEC) 20 MG capsule Take 20-40 mg by mouth See admin instructions. Alternates taking 40mg  daily with 20mg    . potassium chloride (K-DUR) 10 MEQ tablet Take 1 tablet (10 mEq total) by mouth daily. (Patient taking differently: Take 10 mEq by mouth daily with supper. )   . triamcinolone cream (KENALOG) 0.1 % Apply 1 application topically 2 (two) times daily as needed (itching).    No facility-administered encounter medications on file as of 02/02/2020.    Functional Status:  In your present state of health, do you have any difficulty performing the following activities: 02/02/2020 01/22/2020  Hearing? - N  Vision? - Y  Difficulty concentrating or making decisions? - Y  Walking or climbing stairs? - Y  Dressing or bathing? - Y  Doing errands, shopping? - Y  Preparing Food and eating ? - Y  Using the Toilet? - Y  In the past six months, have you accidently leaked urine? Y -  Do you have problems with loss of bowel control? N -  Managing your Medications? - Y  Managing your Finances? - Y   Housekeeping or managing your Housekeeping? - Y  Some recent data might be hidden    Fall/Depression Screening: Fall Risk  01/22/2020 12/17/2019 10/28/2018  Falls in the past year? 1 1 1   Number falls in past yr: 1 0 1  Injury with Fall? 0 1 1  Risk for fall due to : History of fall(s);Impaired balance/gait - Impaired balance/gait;Impaired mobility;History of fall(s)  Follow up Education provided;Falls prevention discussed - -   PHQ 2/9 Scores 01/22/2020  Exception Documentation Other- indicate reason in comment box  Not completed Unable to speak with patient, has dementia, and only able to speak with patient's son.    Assessment: Received Medicare [place] Management Post Acute Care Coordinator transition of care referral on 01/18/2020. Referral source: Marthenia Rolling. Referral reason: Please assign to [place] [facility]. [facility] Will have [facility]. Will have 24/7 caregivers. Son [name] ([place]) primary contact [phone]. Agreeable to [facility] services and to [facility] palliative referral. ACC referral pending. Other Diagnosis: HTN, vascular dementia, mild cognitive impairment, HLD.Transition of care follow up week #2 completed and RNCM will follow up with patient's son for updates within 7 business days.   THN CM Care Plan Problem One     Most Recent Value  Care Plan Problem One  Risk for hospital readmission secondary to recently diagnosed with acute CVA.  Role Documenting the Problem One  Care Management Telephonic Coordinator  Care Plan for Problem One  Active  THN Long Term Goal   Over the next 60 days, patient will not be hospitalized for complications related to chronic illnesses.  THN Long Term Goal Start Date  01/22/20  Interventions for Problem One Long Term Goal  Discussed general health assessment follow up items for next outreach with patient's son..  THN CM Short Term Goal #1   Over the next 30 days patient will take all medications as  prescribed, as evidenced by patient's son reporting during [facility] RN CM outreach  [facility] CM Short Term Goal #1 Start Date  01/22/20  Interventions for Short Term Goal #1  Discussed with patient's son, medications, and activities of daily living directed by caregivers due to patient's poor vision.  THN CM Short Term Goal #2   Over the next 30 days, patient will not experience hospital readmission, as evidence by patient's son reporting and review of EMR during [place] outreach.  THN CM Short Term Goal #2 Start Date  01/22/20  Interventions for Short Term Goal #2  Discussed with patient's son, patient's upcoming MD appointments and questions for each visit.       Plan: RNCM will send primary MD barrier/ involvement letter and route assessment.  RNCM will call patient's son for telephone outreach attempt within 7 business days, transition of care weekly follow up.     Tosca Pletz H. Annia Friendly, BSN, [place] Management [facility] Telephonic CM Phone: [phone] Fax: [phone]

## 2020-02-02 NOTE — Telephone Encounter (Signed)
Per Lexine Baton, pt popped back up in referrals workqueue and Nikki scheduled for 5/13 at 330.

## 2020-02-03 DIAGNOSIS — H547 Unspecified visual loss: Secondary | ICD-10-CM | POA: Diagnosis not present

## 2020-02-03 DIAGNOSIS — I69318 Other symptoms and signs involving cognitive functions following cerebral infarction: Secondary | ICD-10-CM | POA: Diagnosis not present

## 2020-02-03 DIAGNOSIS — I69334 Monoplegia of upper limb following cerebral infarction affecting left non-dominant side: Secondary | ICD-10-CM | POA: Diagnosis not present

## 2020-02-03 DIAGNOSIS — F0150 Vascular dementia without behavioral disturbance: Secondary | ICD-10-CM | POA: Diagnosis not present

## 2020-02-03 DIAGNOSIS — I69398 Other sequelae of cerebral infarction: Secondary | ICD-10-CM | POA: Diagnosis not present

## 2020-02-03 DIAGNOSIS — I10 Essential (primary) hypertension: Secondary | ICD-10-CM | POA: Diagnosis not present

## 2020-02-04 DIAGNOSIS — I69398 Other sequelae of cerebral infarction: Secondary | ICD-10-CM | POA: Diagnosis not present

## 2020-02-04 DIAGNOSIS — I69334 Monoplegia of upper limb following cerebral infarction affecting left non-dominant side: Secondary | ICD-10-CM | POA: Diagnosis not present

## 2020-02-04 DIAGNOSIS — F0150 Vascular dementia without behavioral disturbance: Secondary | ICD-10-CM | POA: Diagnosis not present

## 2020-02-04 DIAGNOSIS — I10 Essential (primary) hypertension: Secondary | ICD-10-CM | POA: Diagnosis not present

## 2020-02-04 DIAGNOSIS — I69318 Other symptoms and signs involving cognitive functions following cerebral infarction: Secondary | ICD-10-CM | POA: Diagnosis not present

## 2020-02-04 DIAGNOSIS — H547 Unspecified visual loss: Secondary | ICD-10-CM | POA: Diagnosis not present

## 2020-02-05 ENCOUNTER — Other Ambulatory Visit: Payer: Self-pay

## 2020-02-05 ENCOUNTER — Other Ambulatory Visit: Payer: Medicare Other | Admitting: *Deleted

## 2020-02-05 DIAGNOSIS — Z515 Encounter for palliative care: Secondary | ICD-10-CM

## 2020-02-07 NOTE — Progress Notes (Signed)
COMMUNITY PALLIATIVE CARE RN NOTE  PATIENT NAME: Claire West DOB: Feb 07, 1941 MRN: [id number]  PRIMARY CARE PROVIDER: Sharilyn Sites, MD  RESPONSIBLE PARTY: Claire West (son) Acct ID - Guarantor Home Phone Work Phone Relationship Acct Type  [id number] - Claire [phone]  Self P/F     [address], [place], [place] [address]   Covid-19 Pre-screening Negative  PLAN OF CARE and INTERVENTION:  1. ADVANCE CARE PLANNING/GOALS OF CARE: Goal is for patient to remain in her home with 24/7 caregivers. She wants to continue to get stronger.  2. PATIENT/CAREGIVER EDUCATION: Explained Palliative care services, safe mobility, s/s of infection 3. DISEASE STATUS: Met with patient and her hired caregiver, Claire West, in her home. Claire West was able to provide patient's West history. She was hospitalized last month from 12/25/19 to 12/28/19 for an acute CVA and was discharged to [facility] for rehab. She is now back at home with resumed 24/7 caregivers. Upon arrival, patient is sitting up in her recliner awake and alert. She is able to answer questions, but is forgetful and has some word-finding difficulties. She denies pain at this time, but does experience pain in her right shoulder at times. She does have some limited ROM in this shoulder, as she is unable to raise it completely. No dyspnea noted or reported. She is ambulatory using a walker and 1 person assistance. She also requires assistance with bathing, dressing, toileting and transfers. She has worsening peripheral vision which causes issues when she tries to turn corners. When travelling outside of the home, she is transported via a wheelchair and is usually accompanied by 2 caregivers. It also takes 2 people to assist with her showers for safety. She has a stair lift, shower chair, shower bench and elevated toilet seat. She also has a bed alarm on her West bed, as she will try at times to get up unassisted at night. She just completed OT  through Claire West. She is currently receiving PT through Claire West and currently has about 4 more sessions left. She is also to start with ST soon to help improve cognition. Her intake is good. She does have some coughing with thin liquids. She takes her medications whole in yogurt. She is incontinent of both bowel and bladder. Claire West suspects that patient may have a UTI d/t dysuria and foul odor. She has an appointment with her PCP on Monday, 02/08/20. She will also discuss her BP medications because she has periods in the evenings where her blood pressure drops and she becomes weak, diaphoretic and clammy. Her BP in the am was 135/87 P 80, and this afternoon is 95/74 P 92.  They are agreeable to future visits from Palliative care. Will continue to monitor.    HISTORY OF PRESENT ILLNESS: This is a 79 yo female with a PMH of CVA, TIA, vascular dementia, HTN, depression and OA. Palliative care has been asked to follow patient for additional support. Will visit patient monthly and PRN.    CODE STATUS: Full code Claire DIRECTIVES: Y MOST FORM: no PPS: 40%   PHYSICAL EXAM:   VITALS: Today's Vitals   02/05/20 1450  BP: 95/74  Pulse: 92  Resp: 18  Temp: (!) 97.2 F (36.2 C)  TempSrc: Temporal  SpO2: 95%  PainSc: 0-No pain    LUNGS: clear to auscultation  CARDIAC: Cor RRR EXTREMITIES: No edema SKIN: Exposed skin is dry and intact  NEURO: Alert and oriented x 3, word-finding difficulties, generalized weakness, poor peripheral vision, ambulatory w/walker   (  Duration of visit and documentation 75 minutes)   Claire Eastern, RN BSN

## 2020-02-08 ENCOUNTER — Encounter: Payer: Self-pay | Admitting: Cardiovascular Disease

## 2020-02-08 DIAGNOSIS — N390 Urinary tract infection, site not specified: Secondary | ICD-10-CM | POA: Diagnosis not present

## 2020-02-08 DIAGNOSIS — I639 Cerebral infarction, unspecified: Secondary | ICD-10-CM | POA: Diagnosis not present

## 2020-02-08 DIAGNOSIS — E876 Hypokalemia: Secondary | ICD-10-CM | POA: Diagnosis not present

## 2020-02-08 DIAGNOSIS — E538 Deficiency of other specified B group vitamins: Secondary | ICD-10-CM | POA: Diagnosis not present

## 2020-02-08 DIAGNOSIS — Z681 Body mass index (BMI) 19 or less, adult: Secondary | ICD-10-CM | POA: Diagnosis not present

## 2020-02-08 DIAGNOSIS — E871 Hypo-osmolality and hyponatremia: Secondary | ICD-10-CM | POA: Diagnosis not present

## 2020-02-08 DIAGNOSIS — E559 Vitamin D deficiency, unspecified: Secondary | ICD-10-CM | POA: Diagnosis not present

## 2020-02-09 DIAGNOSIS — I69398 Other sequelae of cerebral infarction: Secondary | ICD-10-CM | POA: Diagnosis not present

## 2020-02-09 DIAGNOSIS — I69334 Monoplegia of upper limb following cerebral infarction affecting left non-dominant side: Secondary | ICD-10-CM | POA: Diagnosis not present

## 2020-02-09 DIAGNOSIS — I10 Essential (primary) hypertension: Secondary | ICD-10-CM | POA: Diagnosis not present

## 2020-02-09 DIAGNOSIS — H547 Unspecified visual loss: Secondary | ICD-10-CM | POA: Diagnosis not present

## 2020-02-09 DIAGNOSIS — I69318 Other symptoms and signs involving cognitive functions following cerebral infarction: Secondary | ICD-10-CM | POA: Diagnosis not present

## 2020-02-09 DIAGNOSIS — F0150 Vascular dementia without behavioral disturbance: Secondary | ICD-10-CM | POA: Diagnosis not present

## 2020-02-10 ENCOUNTER — Ambulatory Visit: Payer: Medicare Other | Admitting: Adult Health

## 2020-02-11 ENCOUNTER — Other Ambulatory Visit: Payer: Self-pay | Admitting: *Deleted

## 2020-02-11 DIAGNOSIS — F0150 Vascular dementia without behavioral disturbance: Secondary | ICD-10-CM | POA: Diagnosis not present

## 2020-02-11 DIAGNOSIS — I69318 Other symptoms and signs involving cognitive functions following cerebral infarction: Secondary | ICD-10-CM | POA: Diagnosis not present

## 2020-02-11 DIAGNOSIS — I69334 Monoplegia of upper limb following cerebral infarction affecting left non-dominant side: Secondary | ICD-10-CM | POA: Diagnosis not present

## 2020-02-11 DIAGNOSIS — I69398 Other sequelae of cerebral infarction: Secondary | ICD-10-CM | POA: Diagnosis not present

## 2020-02-11 DIAGNOSIS — I10 Essential (primary) hypertension: Secondary | ICD-10-CM | POA: Diagnosis not present

## 2020-02-11 DIAGNOSIS — H547 Unspecified visual loss: Secondary | ICD-10-CM | POA: Diagnosis not present

## 2020-02-11 NOTE — Patient Outreach (Addendum)
[facility]) Care Management  02/11/2020  Claire West [birth year] [id number]   Subjective: Telephone call to patient'sson/ designated party release Claire West), mobile number, spoke with son, requested call back at a later time.    Objective: Per KPN (Knowledge Performance Now, point of care tool) and chart review,patient hospitalized 12/25/2019 - 12/28/2019 forAcute cerebrovascular accident. Patient has a history of vascular dementia, mild cognitive impairments, hypertension,herpes zoster infection, anddepression.   Assessment: Received Medicare [place] Management Post Acute Care Coordinator transition of care referral on 01/18/2020. Referral source: Marthenia Rolling. Referral reason: Please assign to [place]. [facility]. Will have [facility]. Will have 24/7 caregivers. Son Claire West ([place]) primary contact [phone]. Agreeable to [facility] services and to [facility] palliative referral. ACC referral pending. Other Diagnosis: HTN, vascular dementia, mild cognitive impairment, HLD.Transition of care follow up week #2 completed, week #3 pending contact with patient's son, and RNCM will follow up with patient's sonfor updates within 7 business days.    Plan: RNCM will call patient's sonfor telephone outreach attempt within 7 business days, transition of care weekly follow up.     Shereen Marton H. Annia Friendly, BSN, [place] Management [facility] Telephonic CM Phone: [phone] Fax: [phone]

## 2020-02-12 DIAGNOSIS — N390 Urinary tract infection, site not specified: Secondary | ICD-10-CM | POA: Diagnosis not present

## 2020-02-15 ENCOUNTER — Other Ambulatory Visit: Payer: Self-pay | Admitting: *Deleted

## 2020-02-15 NOTE — Patient Outreach (Addendum)
[facility]) Care Management  [place]  02/15/2020   Claire West 12/29/1940 [id number]   Subjective: Telephone call to patient'sson/ designated party release Claire West), mobile number, spoke with son,stated patient's name, date of birth, and address.  Son states patient is doing alright, has been treated for urinary tract infection,  vision still bad, does not know if it will improve, requires a lot of verbal cues to navigate surroundings, and perform activities of daily living.   States patient is continuing to participate with home physical therapy, home speech therapy, and services are going well.  States patient is also receiving palliative care services through [facility] and services are going well.  Son advised patient will continue to receive [facility] Care Management , [facility], and home health services as needed.  States patient had follow up appointment with primary MD on 02/10/2020 and MD has resumed all previous home medications.  Son was not sure of specific med changes and RNCM advised will look in chart to see if primary MD notes are visible.   Per chart review primary MD's note are not visible in [facility] / Epic.   Son states patient has a dentist appointment on 02/16/2020 and follow up appointment with neurologist on 02/18/2020.  States he remembers receiving information from this RNCN (welcome packet and consent), is unaware of the documents current location, and requested documents be re-sent to his post office box: [address], [place], [place] [address].   Son states patient does not have any Air traffic controller,  transportation, Data processing manager, or pharmacy needs at this time.  States he is very appreciative of the follow up, is in agreement to continue to receive [place] Management information, and services on patient's behalf.       Objective: Per KPN (Knowledge Performance Now, point of care tool) and chart review,patient hospitalized 12/25/2019 -  12/28/2019 forAcute cerebrovascular accident. Patient has a history of vascular dementia, mild cognitive impairments, hypertension,herpes zoster infection, anddepression.    Encounter Medications:  Outpatient Encounter Medications as of 02/15/2020  Medication Sig Note  . acetaminophen (TYLENOL) 500 MG tablet Take 500 mg by mouth every 4 (four) hours as needed for mild pain.    Marland Kitchen ALPRAZolam (XANAX) 0.5 MG tablet Take 1 tablet (0.5 mg total) by mouth at bedtime as needed for anxiety.   Marland Kitchen aspirin EC 325 MG EC tablet Take 1 tablet (325 mg total) by mouth daily.   Marland Kitchen atorvastatin (LIPITOR) 40 MG tablet Take 1 tablet (40 mg total) by mouth daily at 6 PM.   . clopidogrel (PLAVIX) 75 MG tablet Take 1 tablet (75 mg total) by mouth daily.   Marland Kitchen escitalopram (LEXAPRO) 10 MG tablet Take 20 mg by mouth at bedtime.   . hydrochlorothiazide (HYDRODIURIL) 25 MG tablet Take 25 mg by mouth daily.   . hydrocortisone cream 1 % Apply 1 application topically 2 (two) times daily as needed for itching.   . irbesartan (AVAPRO) 150 MG tablet Take 1 tablet (150 mg total) by mouth daily.   . memantine (NAMENDA) 5 MG tablet Take 1 tablet (5 mg total) by mouth 2 (two) times daily. (Patient not taking: Reported on 01/22/2020) 12/25/2019: On hold as of 3/17 per PCP  . metoprolol tartrate (LOPRESSOR) 50 MG tablet Take 50 mg by mouth 2 (two) times daily.   . Multiple Vitamin (MULTIVITAMIN ADULT PO) Take 1 tablet by mouth daily at 12 noon.   Marland Kitchen omeprazole (PRILOSEC) 20 MG capsule Take 20-40 mg  by mouth See admin instructions. Alternates taking 40mg  daily with 20mg    . potassium chloride (K-DUR) 10 MEQ tablet Take 1 tablet (10 mEq total) by mouth daily. (Patient taking differently: Take 10 mEq by mouth daily with supper. )   . triamcinolone cream (KENALOG) 0.1 % Apply 1 application topically 2 (two) times daily as needed (itching).    No facility-administered encounter medications on file as of 02/15/2020.    Functional Status:   In your present state of health, do you have any difficulty performing the following activities: 02/02/2020 01/22/2020  Hearing? - N  Vision? - Y  Difficulty concentrating or making decisions? - Y  Walking or climbing stairs? - Y  Dressing or bathing? - Y  Doing errands, shopping? - Y  Preparing Food and eating ? - Y  Using the Toilet? - Y  In the past six months, have you accidently leaked urine? Y -  Do you have problems with loss of bowel control? N -  Managing your Medications? - Y  Managing your Finances? - Y  Housekeeping or managing your Housekeeping? - Y  Some recent data might be hidden    Fall/Depression Screening: Fall Risk  01/22/2020 12/17/2019 10/28/2018  Falls in the past year? 1 1 1   Number falls in past yr: 1 0 1  Injury with Fall? 0 1 1  Risk for fall due to : History of fall(s);Impaired balance/gait - Impaired balance/gait;Impaired mobility;History of fall(s)  Follow up Education provided;Falls prevention discussed - -   PHQ 2/9 Scores 01/22/2020  Exception Documentation Other- indicate reason in comment box  Not completed Unable to speak with patient, has dementia, and only able to speak with patient's son.    Assessment: Received Medicare [place] Management Post Acute Care Coordinator transition of care referral on 01/18/2020. Referral source: Marthenia Rolling. Referral reason: Please assign to [place]. [facility]. Will have [facility]. Will have 24/7 caregivers. Son Claire West ([place]) primary contact [phone]. Agreeable to [facility] services and to [facility] palliative referral. ACC referral pending. Other Diagnosis: HTN, vascular dementia, mild cognitive impairment, HLD.Transition of care follow up week #3 completed and RNCM will follow up with patient's sonfor updates within 7 business days.    THN CM Care Plan Problem One     Most Recent Value  Care Plan Problem One  Risk for hospital readmission secondary to recently  diagnosed with acute CVA.  Role Documenting the Problem One  Care Management Telephonic Coordinator  Care Plan for Problem One  Active  THN Long Term Goal   Over the next 60 days, patient will not be hospitalized for complications related to chronic illnesses.  THN Long Term Goal Start Date  01/22/20  Interventions for Problem One Long Term Goal  Discussed patient's  current medical status and updates from most recent MD visits.   THN CM Short Term Goal #1   Over the next 30 days patient will take all medications as prescribed, as evidenced by patient's son reporting during [facility] RN CM outreach  [facility] CM Short Term Goal #1 Start Date  01/22/20  Interventions for Short Term Goal #1  Discussed patient's medications updates and mobility limitations due to poor vision.   THN CM Short Term Goal #2   Over the next 30 days, patient will not experience hospital readmission, as evidence by patient's son reporting and review of EMR during [place] outreach.  THN CM Short Term Goal #2 Start Date  01/22/20  Interventions for Short Term Goal #2  Discussed patient's recent outpatient treatment for urinary tract infection.        Plan:  RNCM will send email request to Lavell Islam at [place] [place] [facility] to re-send patient welcome outreach letter, welcome packet, consent, [facility] pamphlet, and magnet, to patient's son post office box, per son's request.  RNCM will call patient's sonfor telephone outreach attempt within 7 business days, transition of care weekly follow up.     Rafia Shedden H. Annia Friendly, BSN, [place] Management [facility] Telephonic CM Phone: [phone] Fax: [phone]

## 2020-02-17 DIAGNOSIS — I69318 Other symptoms and signs involving cognitive functions following cerebral infarction: Secondary | ICD-10-CM | POA: Diagnosis not present

## 2020-02-17 DIAGNOSIS — F329 Major depressive disorder, single episode, unspecified: Secondary | ICD-10-CM | POA: Diagnosis not present

## 2020-02-17 DIAGNOSIS — Z7982 Long term (current) use of aspirin: Secondary | ICD-10-CM | POA: Diagnosis not present

## 2020-02-17 DIAGNOSIS — K219 Gastro-esophageal reflux disease without esophagitis: Secondary | ICD-10-CM | POA: Diagnosis not present

## 2020-02-17 DIAGNOSIS — F0150 Vascular dementia without behavioral disturbance: Secondary | ICD-10-CM | POA: Diagnosis not present

## 2020-02-17 DIAGNOSIS — I69398 Other sequelae of cerebral infarction: Secondary | ICD-10-CM | POA: Diagnosis not present

## 2020-02-17 DIAGNOSIS — E785 Hyperlipidemia, unspecified: Secondary | ICD-10-CM | POA: Diagnosis not present

## 2020-02-17 DIAGNOSIS — I69334 Monoplegia of upper limb following cerebral infarction affecting left non-dominant side: Secondary | ICD-10-CM | POA: Diagnosis not present

## 2020-02-17 DIAGNOSIS — H547 Unspecified visual loss: Secondary | ICD-10-CM | POA: Diagnosis not present

## 2020-02-17 DIAGNOSIS — Z7902 Long term (current) use of antithrombotics/antiplatelets: Secondary | ICD-10-CM | POA: Diagnosis not present

## 2020-02-17 DIAGNOSIS — F419 Anxiety disorder, unspecified: Secondary | ICD-10-CM | POA: Diagnosis not present

## 2020-02-17 DIAGNOSIS — I10 Essential (primary) hypertension: Secondary | ICD-10-CM | POA: Diagnosis not present

## 2020-02-17 DIAGNOSIS — M1990 Unspecified osteoarthritis, unspecified site: Secondary | ICD-10-CM | POA: Diagnosis not present

## 2020-02-18 ENCOUNTER — Other Ambulatory Visit: Payer: Self-pay

## 2020-02-18 ENCOUNTER — Ambulatory Visit (INDEPENDENT_AMBULATORY_CARE_PROVIDER_SITE_OTHER): Payer: Medicare Other | Admitting: Neurology

## 2020-02-18 ENCOUNTER — Encounter: Payer: Self-pay | Admitting: Neurology

## 2020-02-18 VITALS — BP 138/84 | HR 78 | Temp 97.0°F | Ht 66.0 in | Wt 146.0 lb

## 2020-02-18 DIAGNOSIS — I639 Cerebral infarction, unspecified: Secondary | ICD-10-CM

## 2020-02-18 DIAGNOSIS — Z8673 Personal history of transient ischemic attack (TIA), and cerebral infarction without residual deficits: Secondary | ICD-10-CM | POA: Insufficient documentation

## 2020-02-18 NOTE — Progress Notes (Signed)
Provider:  Larey Seat, M D  Referring Provider: Sharilyn Sites, MD Primary Care Physician:  Sharilyn Sites, MD  Chief Complaint  Patient presents with  . Follow-up    pt with son, she had a fall about a 1.5 week ago and she fell going to the bathroom. she is now walking with her walker.   02-18-2020: Claire West is a 79 y.o. female patient, seen with confusion, but no more falls, since treated hypotension, no more weakness. UTI caused more confusion. She followed 14 days after our last visit with the local ED,  On 25 December 2019 just the day before her birthday.  She was seen by Dr. Denton Brick for altered mental status.  She had again worsened over the sick several days prior to this emergency room visit repeating the same statements and questions over and over and was appearing paranoid concerned frightened.  There was also some weakness in her left upper extremity that just occurred over the past 3 days prior to her visit.  She had been started by me on Namenda but did not tolerated but she was able to complete a prescription for Valtrex.  She ambulated with a walker at baseline at the time here today she is seated in a wheelchair but she can transfer.  The emergency room ordered a CT A of the brain first and this showed a new stroke located in the right PCA territory with involvement of the occipital brain and right hippocampus.  There was also a small focus within the right lateral thalamus.  The MRA was heavily motion degraded.  The CT was also suggestive of bilateral bilateral cervical internal carotid artery fibromuscular dysplasia interpreted by Dr. Ulyses Jarred.  The brain MRI confirmed the acute infarction in the right posterior cerebral artery mild swelling but no evidence of hemorrhage which places the date of the stroke at 3 to 5 days prior.  Chronic ischemic changes elsewhere.  Interpreted by Dr. Nelson Chimes. [facility] [place] but  started on medication she had not tolerated before, she was sedated and hypotensive . She couldn't feed herself as she didn't find ( see !) the food .  The lesion explains her further very restricted visual field. She has only some central vision remaining. Has not seen cardiology yet , but needs to.      12-17-1998: Claire West is a 79 y.o. female patient, seen with confusion, disorientation and agitation. She  Has good and bad days, she falls a bit less frequently as she now uses a walker. She is unsteady at night, and more agitated. More impatient . No visual hallucinations. She is holding on to the walls or furniture.  Here today with elevated BP, metoprolol and benicar.  She is constipated. No depression, good appetite.  MMSE today 19/ 30- visual spatial deficit- Lewy body? Has no visual hallucinations, appears worried. Anxious. We discussed xanax low dose - she did not ask for pain medicine.    10-28-2018 with caretaker and sister, having fallen many time. Her caretaker has noted that she falls after turning head or body- off balance. She falls over the walker, with the cane.  I was able today to see a photograph on I asked Mrs. Grizzle when she had fallen and she had quite a significant goose egg on her right forehead, bruising over the face, she seems to tend to fall straight forward.  I was also made aware that  she had a previous admission to hospital on 15 November when she had suddenly felt hot she had been shopping during the day was in a department store as a family went home she developed slurred speech and seemed not quite oriented to her surroundings, her son called EMS and she was admitted to [facility] from there transferred to [facility] for an MRI.  She had a normal urine analysis is very little nitrate, I doubt that she had a UTI at the time.  She had hypertension and she was given hydralazine as her blood pressure exceeded 160/100.  She was continued on Toprol  and Diovan.  She was also given Lovenox as a DVT prophylaxis.  Again the patient was transferred after observation.  To [facility] where an MRI of the brain was obtained which showed thank goodness no acute strokes or intracranial bleed.  The neuro hospitalist Dr. Virgina Norfolk evaluated her up and at the time of his examination her extremities moved with normal strength and coordination bilateral pupils equally responsive, she had a symmetric smile he did not find any dysarthria.  Labs showed high triglycerides, overall cholesterol normal limits, she was also having a sodium level of 134 glucose level of 117 but she had not been fast.  White blood cell count was 6.0 no indication of infection.  CT of the head stable focal right frontal white matter hypodensity encephalomalacia, age-related involutional state changes.  No hypodensity, atherosclerosis of the sixth carotid siphons were seen.  Also atherosclerosis of both vertebral arteries noted.  MRI brain moderate intracranial atherosclerosis.  Severe stenosis of the left V4 and left P2 segments no emergent large vessel occlusion.  Chest x-ray no acute pulmonary process and mild cardiomegaly.     RV with son and caretaker CNA, 07-07-2018. I had the pleasure of seeing this patient on December 24, 2017 the day before her 56 first day.  See history below.  We held off after our last visit on starting any kind of memory supportive medication as I was not sure that the patient has a ongoing chronic metabolic encephalopathy or rather a delirium but had something to do with the medication and conditions she was treated in the hospital for.  She also had acute electrolyte imbalances which can cause confusion memory loss and sometimes even pseudo-psychotic episodes.  Today she performed well on a Mini-Mental Status Examination she was fully oriented to [facility] [place] and state, she knew it was 7 September but not sure about the day of the week.  She was  able to spell a word backwards which gave her 4 out of 5 possible points she recalled 3 immediate recall words, she follows all commands fluently and performed also verbal and written commands.  Visual-spatial orientation was intact. She has daytime caregivers, urine incontinence is a problem, and she forgets medications ,meals, but she is not longer in delirium. .   Inspite of this MMSE was 27/30 points today! Her husband is in memory care, her brother in law had frontal dementia, a sister in law with lymphoma.   She has undergone a knee replacement and has undergone exercises and PT concluded just last week , walks with a walker. She feels unsteady- She has a swollen left leg, with a hematoma under the surgical knee site.   MMSE - Mini Mental State Exam 12/17/2019 06/17/2019 07/07/2018 12/24/2017  Not completed: - (No Data) - -  Orientation to time 4 5 4  4  Orientation to Place 4 5 5 4   Registration 3 3 3 3   Attention/ Calculation 2 4 3 4   Recall 1 1 3 2   Language- name 2 objects 2 2 2 1   Language- repeat 1 1 1 1   Language- follow 3 step command 3 3 3 3   Language- read & follow direction 1 1 1 1   Write a sentence 1 1 1 1   Copy design 1 1 1  0  Total score 23 27 27 24                                                               28/ 30 - I used WORLD backwards instead of serial 7s.    07-07-2018 I added a MOCA  21/ 30 points- this is indicative of early dementia.     HRI :Consult for this 79 year old female patient who is seen here as a referral from Dr. Hilma Favors for follow up on a metabolic encephalopathy late January and early February 2019, and earlier an episode in November 2018. shehas a history of HTN, osteoarthritis and 2 strokes.  Neurology  I have the pleasure of seeing Mrs. Boonstra  on 19 March [id number], 79 year old Caucasian right-handed female patient of Dr. Hilma Favors presents after a hospital admission between January 30 and November 08, 2017 when she was diagnosed with acute metabolic  encephalopathy hyponatremia, hypokalemia.  It was assumed that blood pressure medications which she has taken for over a decade had caused the electrolyte imbalance.  There were also previous episodes of low potassium in late Ultram 2018 noticed.  I would like to "her current medications which include aspirin, Citrucel, multivitamins, calcium, tramadol, oxybutynin chloride, omeprazole, meloxicam, escitalopram, losartan, hydrochlorothiazide and metoprolol.  She no longer takes meloxicam, tramadol , oxybutynin. She feels better but she still has sundowning, asks more repetitive questions to wards the evening, more forgetful.   She is often frustrated and her family is concerned about her driving. The patient's Blood pressure control is very important to slow the progression of neuro micro-vascular disease.  I will revisit with her in 4 month and offered in the meanwhile start her on Namenda , with the goal to start aricept as addition. She is at this time inclined to wait, have repeat memory testing and would than decide in 4 month to start medication or not.      Review of Systems: Out of a complete 14 system review, the patient complains of only the following symptoms, and all other reviewed systems are negative.  confusion, repetitive questions, walking unsteadiness. Cane - knee is "Bone on bone"   Social History   Socioeconomic History  . Marital status: Married    Spouse name: Not on file  . Number of children: Not on file  . Years of education: Not on file  . Highest education level: Not on file  Occupational History  . Not on file  Tobacco Use  . Smoking status: Never Smoker  . Smokeless tobacco: Never Used  Substance and Sexual Activity  . Alcohol use: Not Currently    Comment: occ  . Drug use: No  . Sexual activity: Never    Birth control/protection: Surgical  Other Topics Concern  . Not on file  Social History Narrative  . Not on  file   Social Determinants of Health    Financial Resource Strain:   . Difficulty of Paying Living Expenses:   Food Insecurity:   . Worried About Charity fundraiser in the Last Year:   . Arboriculturist in the Last Year:   Transportation Needs: No Transportation Needs  . Lack of Transportation (Medical): No  . Lack of Transportation (Non-Medical): No  Physical Activity:   . Days of Exercise per Week:   . Minutes of Exercise per Session:   Stress:   . Feeling of Stress :   Social Connections:   . Frequency of Communication with Friends and Family:   . Frequency of Social Gatherings with Friends and Family:   . Attends Religious Services:   . Active Member of Clubs or Organizations:   . Attends Archivist Meetings:   Marland Kitchen Marital Status:   Intimate Partner Violence:   . Fear of Current or Ex-Partner:   . Emotionally Abused:   Marland Kitchen Physically Abused:   . Sexually Abused:     Family History  Problem Relation Age of Onset  . Lung cancer Mother   . Lung cancer Sister     Past Medical History:  Diagnosis Date  . Arthritis   . Depression   . Gait instability    walks with cane  . GERD (gastroesophageal reflux disease)   . History of blood transfusion   . Hyperlipemia   . Hypertension   . Knee pain   . Metabolic encephalopathy   . Mild cognitive impairment with memory loss   . Seasonal allergies   . Skin cancer     Past Surgical History:  Procedure Laterality Date  . ABDOMINAL HYSTERECTOMY    . ARTHROSCOPIC REPAIR ACL Left   . COLONOSCOPY    . COLONOSCOPY N/A 11/23/2015   Procedure: COLONOSCOPY;  Surgeon: Rogene Houston, MD;  Location: [facility];  Service: Endoscopy;  Laterality: N/A;  830  . EYE SURGERY Bilateral    Cataracts  . SKIN CANCER EXCISION     Right shoulder  . TOTAL KNEE ARTHROPLASTY Left 05/05/2018   Procedure: LEFT TOTAL KNEE ARTHROPLASTY;  Surgeon: Gaynelle Arabian, MD;  Location: [facility];  Service: Orthopedics;  Laterality: Left;  with block  . TOTAL KNEE ARTHROPLASTY Right  06/22/2019   Procedure: TOTAL KNEE ARTHROPLASTY;  Surgeon: Gaynelle Arabian, MD;  Location: [facility];  Service: Orthopedics;  Laterality: Right;  51min    Current Outpatient Medications  Medication Sig Dispense Refill  . acetaminophen (TYLENOL) 500 MG tablet Take 500 mg by mouth every 4 (four) hours as needed for mild pain.     Marland Kitchen ALPRAZolam (XANAX) 0.5 MG tablet Take 1 tablet (0.5 mg total) by mouth at bedtime as needed for anxiety. 30 tablet 0  . aspirin EC 81 MG tablet Take 81 mg by mouth daily.    . calcium carbonate (TUMS - DOSED IN MG ELEMENTAL CALCIUM) 500 MG chewable tablet Chew 1 tablet by mouth daily as needed for indigestion or heartburn.    . cephALEXin (KEFLEX) 500 MG capsule Take 500 mg by mouth 3 (three) times daily.    . Cholecalciferol (VITAMIN D3 PO) Take 1 capsule by mouth daily.    . clopidogrel (PLAVIX) 75 MG tablet Take 1 tablet (75 mg total) by mouth daily. 30 tablet 0  . escitalopram (LEXAPRO) 10 MG tablet Take 20 mg by mouth at bedtime.  1  . hydrochlorothiazide (HYDRODIURIL) 25 MG tablet Take 25  mg by mouth daily.    . metoprolol tartrate (LOPRESSOR) 50 MG tablet Take 50 mg by mouth 2 (two) times daily.    . Multiple Vitamin (MULTIVITAMIN ADULT PO) Take 1 tablet by mouth daily at 12 noon.    Marland Kitchen olmesartan (BENICAR) 20 MG tablet Take 20 mg by mouth in the morning and at bedtime.    . Omega-3 Fatty Acids (FISH OIL PO) Take 1 capsule by mouth daily.    . Omega-3 Krill Oil 500 MG CAPS Take 1 capsule by mouth daily.    Marland Kitchen omeprazole (PRILOSEC) 20 MG capsule Take 20-40 mg by mouth See admin instructions. Alternates taking 40mg  daily with 20mg     . potassium chloride (K-DUR) 10 MEQ tablet Take 1 tablet (10 mEq total) by mouth daily. (Patient taking differently: Take 10 mEq by mouth daily with supper. ) 5 tablet 0  . pravastatin (PRAVACHOL) 20 MG tablet Take 20 mg by mouth daily.    . Probiotic Product (PROBIOTIC-10 PO) Take 1 capsule by mouth daily. RENEW Probiotic    . TURMERIC PO  Take 1 capsule by mouth daily.     No current facility-administered medications for this visit.    Allergies as of 02/18/2020 - Review Complete 02/18/2020  Allergen Reaction Noted  . Penicillins Anaphylaxis, Swelling, and Rash 07/08/2012  . Adhesive [tape] Itching and Rash 08/22/2018  . Codeine Hives and Rash 07/08/2012  . Neosporin [neomycin-bacitracin zn-polymyx] Rash 04/23/2018  . Sulfa antibiotics Hives and Rash 07/08/2012     CLINICAL DATA:  Initial evaluation for acute altered mental status, confusion, lethargy.  EXAM: MRI HEAD WITHOUT CONTRAST  TECHNIQUE: Multiplanar, multiecho pulse sequences of the brain and surrounding structures were obtained without intravenous contrast.  COMPARISON:  Prior CT from 11/04/2017.  FINDINGS: Brain: Generalized age-related cerebral atrophy. Extensive T2/FLAIR hyperintensity involving the periventricular and deep white matter both cerebral hemispheres, most consistent with chronic microvascular ischemic changes. Chronic microvascular disease present within the pons as well. Superimposed remote lacunar infarcts present within the bilateral basal ganglia, deep white matter, and right thalamus.  No abnormal foci of restricted diffusion to suggest acute or subacute ischemia. Gray-white matter differentiation maintained. No evidence for remote cortical infarction. No acute intracranial hemorrhage. Small chronic microhemorrhage noted within the right thalamus.  No mass lesion, midline shift or mass effect. No hydrocephalus. No extra-axial fluid collection. Major dural sinuses are grossly patent.  Pituitary gland suprasellar region normal. Midline structures intact and normal.  Vascular: Major intracranial vascular flow voids are maintained at the skull base.  Skull and upper cervical spine: Craniocervical junction within normal limits. Mild degenerate spondylolysis noted within the upper cervical spine without  significant stenosis. Bone marrow signal intensity within normal limits. No scalp soft tissue abnormality.  Sinuses/Orbits: Globes and orbital soft tissues within normal limits. Patient status post cataract extraction bilaterally. Scattered mucosal thickening within the ethmoidal air cells and maxillary sinuses. Paranasal sinuses are otherwise clear. No significant mastoid effusion. Inner ear structures grossly normal.  Other: None.  IMPRESSION: 1. No acute intracranial abnormality. 2. Generalized age-related cerebral atrophy with advanced chronic microvascular ischemic disease with scattered remote lacunar infarcts as above.   Electronically Signed   By: Jeannine Boga M.D.   On: 11/06/2017 20:52    Vitals: BP 138/84   Pulse 78   Temp (!) 97 F (36.1 C)   Ht 5\' 6"  (1.676 m)   Wt 146 lb (66.2 kg)   BMI 23.57 kg/m  Last Weight:  Wt Readings from Last  1 Encounters:  02/18/20 146 lb (66.2 kg)   Last Height:   Ht Readings from Last 1 Encounters:  02/18/20 5\' 6"  (1.676 m)    Physical exam:  General: The patient is not fully  alert and appears not in acute distress. The patient is well groomed. Head: Normocephalic, atraumatic. Neck is supple. Mallampati 1, neck circumference:14- Cardiovascular:  Regular rate and rhythm , without  murmurs or carotid bruit, and without distended neck veins. Respiratory: Lungs are clear to auscultation. Skin:  Without evidence of edema, or rash Trunk: stooped posture. Leaning to the right side.   Neurologic exam :   Memory subjective described as ' slow " There is a normal attention span & concentration ability-" small talk". Speech is fluent without dysarthria, dysphonia or aphasia. Mood and affect are appropriate.  MMSE - Mini Mental State Exam 12/17/2019 06/17/2019 07/07/2018  Not completed: - (No Data) -  Orientation to time 4 5 4   Orientation to Place 4 5 5   Registration 3 3 3   Attention/ Calculation 2 4 3   Recall 1 1 3     Language- name 2 objects 2 2 2   Language- repeat 1 1 1   Language- follow 3 step command 3 3 3   Language- read & follow direction 1 1 1   Write a sentence 1 1 1   Copy design 1 1 1   Total score 23 27 27    I did not do MOCA testing. Today, 02-18-2020.   Cranial nerves: Pupils are equal and briskly reactive to light. Saccadic, she is peripherally blind, she can read a paper placed in the lower right corner of the visual field. She is almost blind.  Hearing to finger rub intact.  Facial sensation intact to fine touch. Facial motor strength is symmetric and tongue and uvula move midline.  Tongue protrusion into either cheek is normal.  Shoulder shrug is normal.   Motor exam:  Seated in a wheelchair   Sensory:  Fine touch vibration were absent in feet and reduced in both knees.  Proprioception was abnormal  Coordination: shuffling gait.  Needs to be held by her hand.  Gait and station: Patient no longer walks with a cane -shuffling . Smaller step width.   I worked with the patient and held her right hand was evident that she is leaning towards the right side, and when she turned her turns were very fragmented she is taking very small steps more than 4 steps to turn 180 degrees,  I had her also turn her head left and right which did not lead to a fall but her gait slowed which tells me that she felt insecure or at least off balance and lightheaded.  Deep tendon reflexes: in the  upper and lower extremities are symmetric 2 plus - and intact.  Babinski maneuver response is downgoing.  Assessment:  Dear Dr. Hilma Favors,  After physical and neurologic examination, review of laboratory studies, imaging, neurophysiology testing and pre-existing records, assessment of our mutual patient is  that of :   Advanced microvascular diease and arthrosclerosis, larger vessels such as carotid syphons and vertebral arteries were affected, explaining the fall tendency after a head turn.  STROKE CAUSED VISUAL  FIELD RESTRICTION, ASSOCIATED GAIT INSTABILITY, HIGHER DEGREE OF FEAR. She l is left with the lower right quadrant of her visual field.    Plan:  Treatment plan and additional workup : OT, PT and ST - reading evaluation.   Asencion Partridge Eriana Suliman MD 02/18/2020

## 2020-02-23 ENCOUNTER — Other Ambulatory Visit: Payer: Self-pay | Admitting: *Deleted

## 2020-02-23 ENCOUNTER — Encounter: Payer: Self-pay | Admitting: Cardiovascular Disease

## 2020-02-23 ENCOUNTER — Other Ambulatory Visit: Payer: Self-pay

## 2020-02-23 ENCOUNTER — Ambulatory Visit (INDEPENDENT_AMBULATORY_CARE_PROVIDER_SITE_OTHER): Payer: Medicare Other | Admitting: Cardiovascular Disease

## 2020-02-23 ENCOUNTER — Encounter: Payer: Self-pay | Admitting: *Deleted

## 2020-02-23 VITALS — BP 122/82 | HR 85 | Ht 66.0 in | Wt 149.0 lb

## 2020-02-23 DIAGNOSIS — Z8673 Personal history of transient ischemic attack (TIA), and cerebral infarction without residual deficits: Secondary | ICD-10-CM

## 2020-02-23 DIAGNOSIS — Z01812 Encounter for preprocedural laboratory examination: Secondary | ICD-10-CM | POA: Diagnosis not present

## 2020-02-23 DIAGNOSIS — I10 Essential (primary) hypertension: Secondary | ICD-10-CM

## 2020-02-23 DIAGNOSIS — R0600 Dyspnea, unspecified: Secondary | ICD-10-CM

## 2020-02-23 DIAGNOSIS — R072 Precordial pain: Secondary | ICD-10-CM | POA: Diagnosis not present

## 2020-02-23 DIAGNOSIS — I209 Angina pectoris, unspecified: Secondary | ICD-10-CM | POA: Diagnosis not present

## 2020-02-23 DIAGNOSIS — I4891 Unspecified atrial fibrillation: Secondary | ICD-10-CM | POA: Diagnosis not present

## 2020-02-23 DIAGNOSIS — R0609 Other forms of dyspnea: Secondary | ICD-10-CM

## 2020-02-23 NOTE — Patient Outreach (Signed)
[facility]) Care Management  [place]  02/23/2020   Claire West August 29, 1941 [id number]  Subjective:  Telephone call to patient'sson/ designated party release Claire West), mobile number, spoke with son,stated patient's name, date of birth, and address.  Son states patient is doing alright, making progress, some of vision is returning, and can see some shadows.   Patient had follow up appointment with neurologist on 02/18/2020 and went dentist on 02/16/2020, both appointments went well.  States patient also had a cardiology appointment today but has not received an update on how the appointment went, will update RNCM on next outreach.   Son does not have patient's medication changes available at this time and will update at next outreach.   Patient is continuing to receive home health services and services are going well.  Patient continues to receive private duty care and son is planning to contact home health agency to obtain additional private duty services.   Discussed referral to [place] [facility] Social Worker for private duty resources if needed in the future, son voiced understanding, declined referral at this time, and will notify RNCM if referral needed.   Son states he received [facility] Care Management information, [facility] consent, and answered son's questions regarding consent.   Son states patient  does not have any Air traffic controller, transportation, Data processing manager, or pharmacy needs at this time.  States he is very appreciative of the follow up and is in agreement to continue to receive [place] Management information / services.      Objective: Per KPN (Knowledge Performance Now, point of care tool) and chart review,patient hospitalized 12/25/2019 - 12/28/2019 forAcute cerebrovascular accident. Patient has a history of vascular dementia, mild cognitive impairments, hypertension,herpes zoster infection, anddepression.  Encounter Medications:   Outpatient Encounter Medications as of 02/23/2020  Medication Sig  . acetaminophen (TYLENOL) 500 MG tablet Take 500 mg by mouth every 4 (four) hours as needed for mild pain.   Marland Kitchen ALPRAZolam (XANAX) 0.5 MG tablet Take 1 tablet (0.5 mg total) by mouth at bedtime as needed for anxiety.  Marland Kitchen aspirin EC 81 MG tablet Take 81 mg by mouth daily.  . calcium carbonate (TUMS - DOSED IN MG ELEMENTAL CALCIUM) 500 MG chewable tablet Chew 1 tablet by mouth daily as needed for indigestion or heartburn.  . Cholecalciferol (VITAMIN D3 PO) Take 1 capsule by mouth daily.  . clopidogrel (PLAVIX) 75 MG tablet Take 1 tablet (75 mg total) by mouth daily.  Marland Kitchen escitalopram (LEXAPRO) 10 MG tablet Take 20 mg by mouth at bedtime.  . hydrochlorothiazide (HYDRODIURIL) 25 MG tablet Take 25 mg by mouth daily.  . metoprolol tartrate (LOPRESSOR) 50 MG tablet Take 50 mg by mouth 2 (two) times daily.  . Multiple Vitamin (MULTIVITAMIN ADULT PO) Take 1 tablet by mouth daily at 12 noon.  Marland Kitchen olmesartan (BENICAR) 20 MG tablet Take 20 mg by mouth in the morning and at bedtime.  . Omega-3 Fatty Acids (FISH OIL PO) Take 1 capsule by mouth daily.  . Omega-3 Krill Oil 500 MG CAPS Take 1 capsule by mouth daily.  Marland Kitchen omeprazole (PRILOSEC) 20 MG capsule Take 20-40 mg by mouth See admin instructions. Alternates taking 40mg  daily with 20mg   . potassium chloride (K-DUR) 10 MEQ tablet Take 1 tablet (10 mEq total) by mouth daily. (Patient taking differently: Take 10 mEq by mouth daily with supper. )  . pravastatin (PRAVACHOL) 20 MG tablet Take 20 mg by mouth daily.  . Probiotic Product (PROBIOTIC-10  PO) Take 1 capsule by mouth daily. RENEW Probiotic  . TURMERIC PO Take 1 capsule by mouth daily.   No facility-administered encounter medications on file as of 02/23/2020.    Functional Status:  In your present state of health, do you have any difficulty performing the following activities: 02/02/2020 01/22/2020  Hearing? - N  Vision? - Y  Difficulty  concentrating or making decisions? - Y  Walking or climbing stairs? - Y  Dressing or bathing? - Y  Doing errands, shopping? - Y  Preparing Food and eating ? - Y  Using the Toilet? - Y  In the past six months, have you accidently leaked urine? Y -  Do you have problems with loss of bowel control? N -  Managing your Medications? - Y  Managing your Finances? - Y  Housekeeping or managing your Housekeeping? - Y  Some recent data might be hidden    Fall/Depression Screening: Fall Risk  01/22/2020 12/17/2019 10/28/2018  Falls in the past year? 1 1 1   Number falls in past yr: 1 0 1  Injury with Fall? 0 1 1  Risk for fall due to : History of fall(s);Impaired balance/gait - Impaired balance/gait;Impaired mobility;History of fall(s)  Follow up Education provided;Falls prevention discussed - -   PHQ 2/9 Scores 01/22/2020  Exception Documentation Other- indicate reason in comment box  Not completed Unable to speak with patient, has dementia, and only able to speak with patient's son.    Assessment: Received Medicare [place] Management Post Acute Care Coordinator transition of care referral on 01/18/2020. Referral source: Marthenia Rolling. Referral reason: Please assign to [place]. [facility] Will have [facility]. Will have 24/7 caregivers. Son Claire West ([place]) primary contact [phone]. Agreeable to [facility] services and to [facility] palliative referral. ACC referral pending. Other Diagnosis: HTN, vascular dementia, mild cognitive impairment, HLD.Transition of care follow up week #4completed and RNCM will follow up with patient's sonfor updates within 21 business days.   THN CM Care Plan Problem One     Most Recent Value  Care Plan Problem One  Risk for hospital readmission secondary to recently diagnosed with acute CVA.  Role Documenting the Problem One  Care Management Telephonic Coordinator  Care Plan for Problem One  Active  THN Long Term Goal    Over the next 60 days, patient will not be hospitalized for complications related to chronic illnesses.  THN Long Term Goal Start Date  01/22/20  Interventions for Problem One Long Term Goal  Discussed updates from neurologist and dentist appointments.  THN CM Short Term Goal #1   Over the next 30 days patient will take all medications as prescribed, as evidenced by patient's son reporting during [facility] RN CM outreach  [facility] CM Short Term Goal #1 Start Date  01/22/20  Interventions for Short Term Goal #1  Discussed medication review will be completed at next outreach, since med list not available at this time.   THN CM Short Term Goal #2   Over the next 30 days, patient will not experience hospital readmission, as evidence by patient's son reporting and review of EMR during [place] outreach.  THN CM Short Term Goal #2 Start Date  01/22/20  Interventions for Short Term Goal #2  Discussed patient continues to receive home health therapies and private duty services.         Plan: RNCM will call patient's son for telephone outreach attempt, within 21 business days, other diagnosis complex case  management follow up.     Darlene H. Annia Friendly, BSN, [place] Management [facility] Telephonic CM Phone: [phone] Fax: [phone]

## 2020-02-23 NOTE — Progress Notes (Signed)
CARDIOLOGY CONSULT NOTE  Patient ID: Claire West MRN: [id number] DOB/AGE: [birth year] 79 y.o.  Admit date: (Not on file) Primary Physician: Sharilyn Sites, MD  Reason for Consultation: Dyspnea on exertion  HPI: Claire West is a 79 y.o. female who is being seen today for the evaluation of dyspnea on exertion at the request of Sharilyn Sites, MD.   I reviewed notes from her PCP.  I reviewed labs dated 02/08/2020: White blood cell 6.2, TSH 1.49, sodium 134, potassium 3.5, platelets 255, hemoglobin 12.5, creatinine 0.62, BUN 5.  She was evaluated by neurology on 02/18/2020 most recently.  She recently sustained a stroke in the right PCA territory in March 2021.  She underwent an unremarkable transthoracic echocardiogram which was reviewed in detail below.  CT angiography of the neck on 12/26/2019 showed bilateral cervical internal carotid artery fibromuscular dysplasia.  I personally reviewed ECG performed on 12/25/2019 which demonstrated sinus rhythm with nonspecific ST segment depressions.  She is present with her caretaker and her sister.  The patient has been experiencing progressive shortness of breath with minimal exertion.  This can occur when she is walking from her bedroom to the bathroom.  She ambulates with a walker.  She denies exertional chest pain.  Her caretaker told me that at around [place] last year she had had a weak spell and she was unable to get her blood pressure and heart rate.  She called EMS and the patient was found to be in atrial fibrillation.  I have reviewed several ECGs from last year as well as 2019 and this year as well and none of these ECGs demonstrate atrial fibrillation.  She denies palpitations.  She does have episodic weak spells.   Allergies  Allergen Reactions  . Penicillins Anaphylaxis, Swelling and Rash    Oral rash/peeling Did it involve swelling of the face/tongue/throat, SOB, or low BP? Yes Did it involve sudden or severe  rash/hives, skin peeling, or any reaction on the inside of your mouth or nose? Yes Did you need to seek medical attention at a hospital or doctor's office? Yes When did it last happen? Approximately 2017 If all above answers are "NO", may proceed with cephalosporin use. **Has tolerated Rocephin, Vantin since 2017**    . Adhesive [Tape] Itching and Rash    Irritation at site  . Codeine Hives and Rash  . Neosporin [Neomycin-Bacitracin Zn-Polymyx] Rash  . Sulfa Antibiotics Hives and Rash    Current Outpatient Medications  Medication Sig Dispense Refill  . acetaminophen (TYLENOL) 500 MG tablet Take 500 mg by mouth every 4 (four) hours as needed for mild pain.     Marland Kitchen ALPRAZolam (XANAX) 0.5 MG tablet Take 1 tablet (0.5 mg total) by mouth at bedtime as needed for anxiety. 30 tablet 0  . aspirin EC 81 MG tablet Take 81 mg by mouth daily.    . calcium carbonate (TUMS - DOSED IN MG ELEMENTAL CALCIUM) 500 MG chewable tablet Chew 1 tablet by mouth daily as needed for indigestion or heartburn.    . Cholecalciferol (VITAMIN D3 PO) Take 1 capsule by mouth daily.    . clopidogrel (PLAVIX) 75 MG tablet Take 1 tablet (75 mg total) by mouth daily. 30 tablet 0  . escitalopram (LEXAPRO) 10 MG tablet Take 20 mg by mouth at bedtime.  1  . hydrochlorothiazide (HYDRODIURIL) 25 MG tablet Take 25 mg by mouth daily.    . metoprolol tartrate (LOPRESSOR) 50 MG tablet Take 50 mg  by mouth 2 (two) times daily.    . Multiple Vitamin (MULTIVITAMIN ADULT PO) Take 1 tablet by mouth daily at 12 noon.    Marland Kitchen olmesartan (BENICAR) 20 MG tablet Take 20 mg by mouth in the morning and at bedtime.    . Omega-3 Fatty Acids (FISH OIL PO) Take 1 capsule by mouth daily.    . Omega-3 Krill Oil 500 MG CAPS Take 1 capsule by mouth daily.    Marland Kitchen omeprazole (PRILOSEC) 20 MG capsule Take 20-40 mg by mouth See admin instructions. Alternates taking 40mg  daily with 20mg     . potassium chloride (K-DUR) 10 MEQ tablet Take 1 tablet (10 mEq  total) by mouth daily. (Patient taking differently: Take 10 mEq by mouth daily with supper. ) 5 tablet 0  . pravastatin (PRAVACHOL) 20 MG tablet Take 20 mg by mouth daily.    . TURMERIC PO Take 1 capsule by mouth daily.    . Probiotic Product (PROBIOTIC-10 PO) Take 1 capsule by mouth daily. RENEW Probiotic     No current facility-administered medications for this visit.    Past Medical History:  Diagnosis Date  . Arthritis   . Depression   . Gait instability    walks with cane  . GERD (gastroesophageal reflux disease)   . History of blood transfusion   . Hyperlipemia   . Hypertension   . Knee pain   . Metabolic encephalopathy   . Mild cognitive impairment with memory loss   . Seasonal allergies   . Skin cancer     Past Surgical History:  Procedure Laterality Date  . ABDOMINAL HYSTERECTOMY    . ARTHROSCOPIC REPAIR ACL Left   . COLONOSCOPY    . COLONOSCOPY N/A 11/23/2015   Procedure: COLONOSCOPY;  Surgeon: Rogene Houston, MD;  Location: [facility];  Service: Endoscopy;  Laterality: N/A;  830  . EYE SURGERY Bilateral    Cataracts  . SKIN CANCER EXCISION     Right shoulder  . TOTAL KNEE ARTHROPLASTY Left 05/05/2018   Procedure: LEFT TOTAL KNEE ARTHROPLASTY;  Surgeon: Gaynelle Arabian, MD;  Location: [facility] ORS;  Service: Orthopedics;  Laterality: Left;  with block  . TOTAL KNEE ARTHROPLASTY Right 06/22/2019   Procedure: TOTAL KNEE ARTHROPLASTY;  Surgeon: Gaynelle Arabian, MD;  Location: [facility] ORS;  Service: Orthopedics;  Laterality: Right;  47min    Social History   Socioeconomic History  . Marital status: Married    Spouse name: Not on file  . Number of children: Not on file  . Years of education: Not on file  . Highest education level: Not on file  Occupational History  . Not on file  Tobacco Use  . Smoking status: Never Smoker  . Smokeless tobacco: Never Used  Substance and Sexual Activity  . Alcohol use: Not Currently    Comment: occ  . Drug use: No  . Sexual  activity: Never    Birth control/protection: Surgical  Other Topics Concern  . Not on file  Social History Narrative  . Not on file   Social Determinants of Health   Financial Resource Strain:   . Difficulty of Paying Living Expenses:   Food Insecurity:   . Worried About Charity fundraiser in the Last Year:   . Arboriculturist in the Last Year:   Transportation Needs: No Transportation Needs  . Lack of Transportation (Medical): No  . Lack of Transportation (Non-Medical): No  Physical Activity:   . Days of Exercise per  Week:   . Minutes of Exercise per Session:   Stress:   . Feeling of Stress :   Social Connections:   . Frequency of Communication with Friends and Family:   . Frequency of Social Gatherings with Friends and Family:   . Attends Religious Services:   . Active Member of Clubs or Organizations:   . Attends Archivist Meetings:   Marland Kitchen Marital Status:   Intimate Partner Violence:   . Fear of Current or Ex-Partner:   . Emotionally Abused:   Marland Kitchen Physically Abused:   . Sexually Abused:      No family history of premature CAD in 1st degree relatives.  Current Meds  Medication Sig  . acetaminophen (TYLENOL) 500 MG tablet Take 500 mg by mouth every 4 (four) hours as needed for mild pain.   Marland Kitchen ALPRAZolam (XANAX) 0.5 MG tablet Take 1 tablet (0.5 mg total) by mouth at bedtime as needed for anxiety.  Marland Kitchen aspirin EC 81 MG tablet Take 81 mg by mouth daily.  . calcium carbonate (TUMS - DOSED IN MG ELEMENTAL CALCIUM) 500 MG chewable tablet Chew 1 tablet by mouth daily as needed for indigestion or heartburn.  . Cholecalciferol (VITAMIN D3 PO) Take 1 capsule by mouth daily.  . clopidogrel (PLAVIX) 75 MG tablet Take 1 tablet (75 mg total) by mouth daily.  Marland Kitchen escitalopram (LEXAPRO) 10 MG tablet Take 20 mg by mouth at bedtime.  . hydrochlorothiazide (HYDRODIURIL) 25 MG tablet Take 25 mg by mouth daily.  . metoprolol tartrate (LOPRESSOR) 50 MG tablet Take 50 mg by mouth 2 (two)  times daily.  . Multiple Vitamin (MULTIVITAMIN ADULT PO) Take 1 tablet by mouth daily at 12 noon.  Marland Kitchen olmesartan (BENICAR) 20 MG tablet Take 20 mg by mouth in the morning and at bedtime.  . Omega-3 Fatty Acids (FISH OIL PO) Take 1 capsule by mouth daily.  . Omega-3 Krill Oil 500 MG CAPS Take 1 capsule by mouth daily.  Marland Kitchen omeprazole (PRILOSEC) 20 MG capsule Take 20-40 mg by mouth See admin instructions. Alternates taking 40mg  daily with 20mg   . potassium chloride (K-DUR) 10 MEQ tablet Take 1 tablet (10 mEq total) by mouth daily. (Patient taking differently: Take 10 mEq by mouth daily with supper. )  . pravastatin (PRAVACHOL) 20 MG tablet Take 20 mg by mouth daily.  . TURMERIC PO Take 1 capsule by mouth daily.      Review of systems complete and found to be negative unless listed above in HPI   [facility], LPN was present throughout the entirety of the encounter.  Physical exam Blood pressure 122/82, pulse 85, height 5\' 6"  (1.676 m), weight 149 lb (67.6 kg), SpO2 96 %. General: NAD Neck: No JVD, no thyromegaly or thyroid nodule.  Lungs: Clear to auscultation bilaterally with normal respiratory effort. CV: Nondisplaced PMI. Regular rate and rhythm, normal S1/S2, no S3/S4, no murmur.  No peripheral edema.    Abdomen: Soft, nontender, no distention.  Skin: Intact without lesions or rashes.  Neurologic: Alert.  Psych: Somewhat flat affect. Extremities: No clubbing or cyanosis.  HEENT: Normal.   ECG: Most recent ECG reviewed.   Labs: Lab Results  Component Value Date/Time   K 3.9 12/25/2019 01:02 PM   BUN 10 12/25/2019 01:02 PM   CREATININE 0.61 12/25/2019 01:02 PM   ALT 22 12/25/2019 01:02 PM   TSH 0.942 11/06/2017 01:29 PM   HGB 13.8 12/25/2019 01:02 PM     Lipids: Lab Results  Component  Value Date/Time   LDLCALC 105 (H) 12/26/2019 07:33 AM   CHOL 172 12/26/2019 07:33 AM   TRIG 95 12/26/2019 07:33 AM   HDL 48 12/26/2019 07:33 AM       Echocardiogram 12/27/2019:  1.  Left ventricular ejection fraction, by estimation, is 55 to 60%. The  left ventricle has normal function. The left ventricle has no regional  wall motion abnormalities. Left ventricular diastolic parameters are  consistent with Grade I diastolic  dysfunction (impaired relaxation).  2. Right ventricular systolic function is normal. The right ventricular  size is normal. Tricuspid regurgitation signal is inadequate for assessing  PA pressure.  3. The mitral valve is grossly normal. Trivial mitral valve  regurgitation.  4. The aortic valve was not well visualized. Aortic valve regurgitation  is mild.    ASSESSMENT AND PLAN:   1.  Dyspnea on exertion: Symptoms are suspicious for angina pectoris.  Given her history of CVA she is at high risk for obstructive coronary artery disease.  I will proceed with coronary CT angiography.  2.  Recent CVA: Currently on aspirin, clopidogrel, and statin.  3.  Hypertension: Controlled on present therapy.  No changes.  4.  Atrial fibrillation: There is a reported history of atrial fibrillation.  She may have converted to sinus rhythm in the ambulance.  I do not have any ECGs documenting atrial fibrillation from 2019 until today.  I have asked the patient sister and caretaker to let me know if she is having palpitations.  If so, I would obtain cardiac event monitoring and if she does demonstrate atrial fibrillation, she would require systemic anticoagulation.    Disposition: Follow up in 3 months virtually  Signed: Kate Sable, M.D., F.A.C.C.  02/23/2020, 3:18 PM

## 2020-02-23 NOTE — Patient Instructions (Addendum)
Medication Instructions:  Continue all current medications.  Labwork: BMET - order given today.  Testing/Procedures: Your physician has requested that you have cardiac CT. Cardiac computed tomography (CT) is a painless test that uses an x-ray machine to take clear, detailed pictures of your heart. For further information please visit [facility]. Please follow instruction sheet as given.  Follow-Up:  Office will contact with results via phone or letter.    3 months   Any Other Special Instructions Will Be Listed Below (If Applicable).  If you need a refill on your cardiac medications before your next appointment, please call your pharmacy.

## 2020-02-26 ENCOUNTER — Telehealth: Payer: Self-pay | Admitting: *Deleted

## 2020-02-26 NOTE — Telephone Encounter (Signed)
Called and spoke with patient's hired caregiver, Jackelyn Poling, to schedule a home Palliative care visit. Visit scheduled for 5/26@330p .

## 2020-02-29 DIAGNOSIS — H547 Unspecified visual loss: Secondary | ICD-10-CM | POA: Diagnosis not present

## 2020-02-29 DIAGNOSIS — F419 Anxiety disorder, unspecified: Secondary | ICD-10-CM | POA: Diagnosis not present

## 2020-02-29 DIAGNOSIS — K219 Gastro-esophageal reflux disease without esophagitis: Secondary | ICD-10-CM | POA: Diagnosis not present

## 2020-02-29 DIAGNOSIS — F0150 Vascular dementia without behavioral disturbance: Secondary | ICD-10-CM | POA: Diagnosis not present

## 2020-03-02 ENCOUNTER — Other Ambulatory Visit: Payer: Self-pay

## 2020-03-02 ENCOUNTER — Other Ambulatory Visit: Payer: Medicare Other

## 2020-03-02 ENCOUNTER — Other Ambulatory Visit: Payer: Medicare Other | Admitting: *Deleted

## 2020-03-02 DIAGNOSIS — Z515 Encounter for palliative care: Secondary | ICD-10-CM

## 2020-03-04 NOTE — Progress Notes (Signed)
COMMUNITY PALLIATIVE CARE SW NOTE  PATIENT NAME: Claire West DOB: Feb 24, 1941 MRN: [id number]  PRIMARY CARE PROVIDER: Sharilyn Sites, MD  RESPONSIBLE PARTY:  Acct ID - Guarantor Home Phone Work Phone Relationship Acct Type  [id number] - [name]  Self P/F     [place], [place], [place] [address]     PLAN OF CARE and INTERVENTIONS:             1. GOALS OF CARE/ ADVANCE CARE PLANNING:  Goal is for patient to remain in her home with 24 hour care support. Patient is a FULL CODE. 2. SOCIAL/EMOTIONAL/SPIRITUAL ASSESSMENT/ INTERVENTIONS:  SW and RN-Monishia Howard completed visit with patient at her home. Her private caregiver-Debbie was present with her. Patient was recently treated for a UTI and is feeling better. She continues to ambulate with her walker and is tolerating this well. She remains dependent for personal care needs and is a 2-person assist with showers. She continues to have intermittent instances where her blood pressure will drop, usually in the morning, and her medication is adjusted at that times. Patient has an intermittent cough with clear flym. She continues to receive physical therapy and speech through [facility], which has improved her vision. She around the clock sitters. She was engaged and cordial during visit with team and remains open to ongoing supportive visits. Patient shared social history on herself  SW provided supportive presence, introduction as new palliative care SW, assessed needs, comfort and coping of patient, observation, active listening and life review.  3. PATIENT/CAREGIVER EDUCATION/ COPING:  Patient seems to be coping well and verbalized no mental or emotional distress. She was appeared to be in a cheerful mood. Patient has a supportive son and sister. She has a good rapport with her sitters and they serve as a source of support to her.  4. PERSONAL EMERGENCY PLAN:  911 can be activated for emergencies.  5. COMMUNITY RESOURCES  COORDINATION/ HEALTH CARE NAVIGATION:  Patient has private sitters. She also receives physical and speech therapy through [facility]. 6. FINANCIAL/LEGAL CONCERNS/INTERVENTIONS:  No financial or legal concerns noted.      SOCIAL HX:  Social History   Tobacco Use  . Smoking status: Never Smoker  . Smokeless tobacco: Never Used  Substance Use Topics  . Alcohol use: Not Currently    Comment: occ    CODE STATUS:  DNR ADVANCED DIRECTIVES: No MOST FORM COMPLETE:  No HOSPICE EDUCATION PROVIDED: No  PPS: Patient is alert and oriented x3, with some forgetfulness. She ambulates with her walker, but her gait remains unsteady. Patient has 24 hour sitters that provide assistance with personal care, house chores and support.    Duration of visit and documentation: 60 minutes      Katheren Puller, LCSW

## 2020-03-07 NOTE — Progress Notes (Signed)
COMMUNITY PALLIATIVE CARE RN NOTE  PATIENT NAME: Claire West DOB: 24-Aug-1941 MRN: [id number]  PRIMARY CARE PROVIDER: Sharilyn Sites, MD  RESPONSIBLE PARTY: [name] (son) Acct ID - Guarantor Home Phone Work Phone Relationship Acct Type  [id number] - Claire [id number]  Self P/F     [employer], [place], [place] [address]   Covid-19 Pre-screening Negative  PLAN OF CARE and INTERVENTION:  1. ADVANCE CARE PLANNING/GOALS OF CARE: Goal is for patient to remain in her home and avoid hospitalizations. 2. PATIENT/CAREGIVER EDUCATION: Symptom management, safe mobility/transfers, s/s of infection 3. DISEASE STATUS: Joint visit made with Palliative care SW, Claire West. Met with patient and her hired caregiver, Claire West. Upon arrival, patient is sitting up in her recliner awake and alert. She is able to answer questions and make needs known. She does have some word-finding difficulties. She denies pain at this time. She had an appointment with her PCP after my visit last month and was diagnosed with a UTI. She was placed on antibiotics and her UTI is now clear. She does have some instances where her BP drops, but she has not had any recent fainting spells accompanied with diaphoresis. Her BP medications have had to be held in the am recently. She does have an occasional productive cough with clear phlegm. Caregiver will perform percussion to her back to help loosen up phlegm to help cough be more productive. She has difficulties coughing it up at times. She is ambulatory using a walker. She requires 2 person assistance with showers, but 1 person assistance with dressing, transfers and ambulation. She is able to feed herself independently. Her intake remains good and she takes her medications whole in yogurt. She is incontinent of both bowel and bladder and wears Depends. Since last month, her PT was renewed and she has 1 more visit left. They are still awaiting an evaluation by speech therapy. She has poor  peripheral vision, but her caregiver has been working on reading with her and she is doing better. She has also been able to read the newspaper some with the magnifying glass. She continues to enjoy watching television and reading. She goes to the beauty shop weekly on Fridays to get her hair done. Will continue to monitor.   HISTORY OF PRESENT ILLNESS: This is a 79 yo female with a PMH of CVA, TIA, vascular dementia, HTN, depression and OA. Palliative care continues to follow patient and visit monthly and PRN.     CODE STATUS: Full code ADVANCED DIRECTIVES: Y MOST FORM: no PPS: 40%   PHYSICAL EXAM:   VITALS: Today's Vitals   03/02/20 1613  BP: (!) 145/88  Pulse: 78  Resp: 16  Temp: 97.8 F (36.6 C)  TempSrc: Temporal  SpO2: 96%  PainSc: 0-No pain    LUNGS: clear to auscultation  CARDIAC: Cor RRR  EXTREMITIES: No edema SKIN: Exposed skin is dry and intact  NEURO: Alert and oriented x 3, forgetful, word-finding difficulties, generalized weakness, ambulatory w/walker   (Duration of visit and documentation 75 minutes)   Daryl Eastern, RN BSN

## 2020-03-10 ENCOUNTER — Ambulatory Visit: Payer: Self-pay | Admitting: *Deleted

## 2020-03-15 ENCOUNTER — Observation Stay (HOSPITAL_COMMUNITY)
Admission: EM | Admit: 2020-03-15 | Discharge: 2020-03-16 | Disposition: A | Discharge status: Home / Self Care | Payer: Medicare Other | Attending: Emergency Medicine | Admitting: Emergency Medicine

## 2020-03-15 ENCOUNTER — Other Ambulatory Visit: Payer: Self-pay

## 2020-03-15 ENCOUNTER — Encounter (HOSPITAL_COMMUNITY): Payer: Self-pay | Admitting: *Deleted

## 2020-03-15 ENCOUNTER — Emergency Department (HOSPITAL_COMMUNITY): Payer: Medicare Other

## 2020-03-15 DIAGNOSIS — E876 Hypokalemia: Secondary | ICD-10-CM | POA: Diagnosis not present

## 2020-03-15 DIAGNOSIS — E860 Dehydration: Secondary | ICD-10-CM | POA: Diagnosis present

## 2020-03-15 DIAGNOSIS — Z743 Need for continuous supervision: Secondary | ICD-10-CM | POA: Diagnosis not present

## 2020-03-15 DIAGNOSIS — K219 Gastro-esophageal reflux disease without esophagitis: Secondary | ICD-10-CM | POA: Diagnosis not present

## 2020-03-15 DIAGNOSIS — Z885 Allergy status to narcotic agent status: Secondary | ICD-10-CM | POA: Insufficient documentation

## 2020-03-15 DIAGNOSIS — Z20822 Contact with and (suspected) exposure to covid-19: Secondary | ICD-10-CM | POA: Diagnosis not present

## 2020-03-15 DIAGNOSIS — F329 Major depressive disorder, single episode, unspecified: Secondary | ICD-10-CM | POA: Insufficient documentation

## 2020-03-15 DIAGNOSIS — F0151 Vascular dementia with behavioral disturbance: Secondary | ICD-10-CM | POA: Insufficient documentation

## 2020-03-15 DIAGNOSIS — R1111 Vomiting without nausea: Secondary | ICD-10-CM | POA: Diagnosis not present

## 2020-03-15 DIAGNOSIS — E871 Hypo-osmolality and hyponatremia: Secondary | ICD-10-CM | POA: Diagnosis not present

## 2020-03-15 DIAGNOSIS — Z7982 Long term (current) use of aspirin: Secondary | ICD-10-CM | POA: Diagnosis not present

## 2020-03-15 DIAGNOSIS — Z880 Allergy status to penicillin: Secondary | ICD-10-CM | POA: Insufficient documentation

## 2020-03-15 DIAGNOSIS — Z8673 Personal history of transient ischemic attack (TIA), and cerebral infarction without residual deficits: Secondary | ICD-10-CM | POA: Insufficient documentation

## 2020-03-15 DIAGNOSIS — R531 Weakness: Secondary | ICD-10-CM | POA: Diagnosis not present

## 2020-03-15 DIAGNOSIS — Z882 Allergy status to sulfonamides status: Secondary | ICD-10-CM | POA: Insufficient documentation

## 2020-03-15 DIAGNOSIS — R55 Syncope and collapse: Secondary | ICD-10-CM | POA: Diagnosis not present

## 2020-03-15 DIAGNOSIS — F01518 Vascular dementia, unspecified severity, with other behavioral disturbance: Secondary | ICD-10-CM | POA: Diagnosis present

## 2020-03-15 DIAGNOSIS — Z7902 Long term (current) use of antithrombotics/antiplatelets: Secondary | ICD-10-CM | POA: Diagnosis not present

## 2020-03-15 DIAGNOSIS — Z79899 Other long term (current) drug therapy: Secondary | ICD-10-CM | POA: Insufficient documentation

## 2020-03-15 DIAGNOSIS — R519 Headache, unspecified: Secondary | ICD-10-CM | POA: Diagnosis not present

## 2020-03-15 DIAGNOSIS — I10 Essential (primary) hypertension: Secondary | ICD-10-CM | POA: Insufficient documentation

## 2020-03-15 LAB — CBC
HCT: 38.8 % (ref 36.0–46.0)
Hemoglobin: 13.5 g/dL (ref 12.0–15.0)
MCH: 33.2 pg (ref 26.0–34.0)
MCHC: 34.8 g/dL (ref 30.0–36.0)
MCV: 95.3 fL (ref 80.0–100.0)
Platelets: 276 10*3/uL (ref 150–400)
RBC: 4.07 MIL/uL (ref 3.87–5.11)
RDW: 12 % (ref 11.5–15.5)
WBC: 9.4 10*3/uL (ref 4.0–10.5)
nRBC: 0 % (ref 0.0–0.2)

## 2020-03-15 LAB — URINALYSIS, ROUTINE W REFLEX MICROSCOPIC
Bilirubin Urine: NEGATIVE
Glucose, UA: NEGATIVE mg/dL
Hgb urine dipstick: NEGATIVE
Ketones, ur: NEGATIVE mg/dL
Nitrite: NEGATIVE
Protein, ur: NEGATIVE mg/dL
Specific Gravity, Urine: 1.011 (ref 1.005–1.030)
pH: 7 (ref 5.0–8.0)

## 2020-03-15 LAB — COMPREHENSIVE METABOLIC PANEL
ALT: 19 U/L (ref 0–44)
AST: 20 U/L (ref 15–41)
Albumin: 3.5 g/dL (ref 3.5–5.0)
Alkaline Phosphatase: 46 U/L (ref 38–126)
Anion gap: 11 (ref 5–15)
BUN: 16 mg/dL (ref 8–23)
CO2: 29 mmol/L (ref 22–32)
Calcium: 8.7 mg/dL — ABNORMAL LOW (ref 8.9–10.3)
Chloride: 92 mmol/L — ABNORMAL LOW (ref 98–111)
Creatinine, Ser: 0.72 mg/dL (ref 0.44–1.00)
GFR calc Af Amer: >60 mL/min (ref >60)
GFR calc non Af Amer: >60 mL/min (ref >60)
Glucose, Bld: 109 mg/dL — ABNORMAL HIGH (ref 70–99)
Potassium: 2.7 mmol/L — CL (ref 3.5–5.1)
Sodium: 132 mmol/L — ABNORMAL LOW (ref 135–145)
Total Bilirubin: 0.5 mg/dL (ref 0.3–1.2)
Total Protein: 6.4 g/dL — ABNORMAL LOW (ref 6.5–8.1)

## 2020-03-15 LAB — RAPID URINE DRUG SCREEN, HOSP PERFORMED
Amphetamines: NOT DETECTED
Barbiturates: NOT DETECTED
Benzodiazepines: NOT DETECTED
Cocaine: NOT DETECTED
Opiates: NOT DETECTED
Tetrahydrocannabinol: NOT DETECTED

## 2020-03-15 LAB — DIFFERENTIAL
Abs Immature Granulocytes: 0.2 10*3/uL — ABNORMAL HIGH (ref 0.00–0.07)
Basophils Absolute: 0.1 10*3/uL (ref 0.0–0.1)
Basophils Relative: 1 %
Eosinophils Absolute: 0.2 10*3/uL (ref 0.0–0.5)
Eosinophils Relative: 2 %
Immature Granulocytes: 2 %
Lymphocytes Relative: 15 %
Lymphs Abs: 1.4 10*3/uL (ref 0.7–4.0)
Monocytes Absolute: 1.2 10*3/uL — ABNORMAL HIGH (ref 0.1–1.0)
Monocytes Relative: 13 %
Neutro Abs: 6.4 10*3/uL (ref 1.7–7.7)
Neutrophils Relative %: 67 %

## 2020-03-15 LAB — MAGNESIUM: Magnesium: 2 mg/dL (ref 1.7–2.4)

## 2020-03-15 LAB — PROTIME-INR
INR: 1 (ref 0.8–1.2)
Prothrombin Time: 13.2 seconds (ref 11.4–15.2)

## 2020-03-15 LAB — TROPONIN I (HIGH SENSITIVITY): Troponin I (High Sensitivity): 4 ng/L (ref <18)

## 2020-03-15 LAB — SARS CORONAVIRUS 2 BY RT PCR (HOSPITAL ORDER, PERFORMED IN ~~LOC~~ HOSPITAL LAB): SARS Coronavirus 2: NEGATIVE

## 2020-03-15 LAB — APTT: aPTT: 27 seconds (ref 24–36)

## 2020-03-15 MED ORDER — ESCITALOPRAM OXALATE 10 MG PO TABS
20.0000 mg | ORAL_TABLET | Freq: Every day | ORAL | Status: DC
  Administered 2020-03-15: 20 mg via ORAL
  Filled 2020-03-15: qty 2

## 2020-03-15 MED ORDER — ACETAMINOPHEN 650 MG RE SUPP: 650.0000 mg | Freq: Four times a day (QID) | RECTAL | Status: DC | PRN

## 2020-03-15 MED ORDER — POTASSIUM CHLORIDE CRYS ER 20 MEQ PO TBCR
40.0000 meq | EXTENDED_RELEASE_TABLET | ORAL | Status: AC
  Administered 2020-03-15 – 2020-03-16 (×2): 40 meq via ORAL
  Filled 2020-03-15 (×2): qty 2

## 2020-03-15 MED ORDER — ENOXAPARIN SODIUM 40 MG/0.4ML ~~LOC~~ SOLN
40.0000 mg | SUBCUTANEOUS | Status: DC
  Administered 2020-03-15: 40 mg via SUBCUTANEOUS
  Filled 2020-03-15: qty 0.4

## 2020-03-15 MED ORDER — HYDROCHLOROTHIAZIDE 25 MG PO TABS
25.0000 mg | ORAL_TABLET | Freq: Every day | ORAL | Status: DC
  Administered 2020-03-16: 25 mg via ORAL
  Filled 2020-03-15: qty 1

## 2020-03-15 MED ORDER — IRBESARTAN 150 MG PO TABS
150.0000 mg | ORAL_TABLET | Freq: Every day | ORAL | Status: DC
  Administered 2020-03-16: 150 mg via ORAL
  Filled 2020-03-15: qty 1

## 2020-03-15 MED ORDER — ASPIRIN EC 81 MG PO TBEC
81.0000 mg | DELAYED_RELEASE_TABLET | Freq: Every morning | ORAL | Status: DC
  Administered 2020-03-16: 81 mg via ORAL
  Filled 2020-03-15: qty 1

## 2020-03-15 MED ORDER — POLYETHYLENE GLYCOL 3350 17 G PO PACK: 17.0000 g | PACK | Freq: Every day | ORAL | Status: DC | PRN

## 2020-03-15 MED ORDER — METOPROLOL TARTRATE 50 MG PO TABS
25.0000 mg | ORAL_TABLET | Freq: Every day | ORAL | Status: DC
  Administered 2020-03-15: 25 mg via ORAL
  Filled 2020-03-15: qty 1

## 2020-03-15 MED ORDER — ONDANSETRON HCL 4 MG/2ML IJ SOLN: 4.0000 mg | Freq: Four times a day (QID) | INTRAMUSCULAR | Status: DC | PRN

## 2020-03-15 MED ORDER — POTASSIUM CHLORIDE 10 MEQ/100ML IV SOLN
10.0000 meq | Freq: Once | INTRAVENOUS | Status: AC
  Administered 2020-03-15: 10 meq via INTRAVENOUS
  Filled 2020-03-15: qty 100

## 2020-03-15 MED ORDER — METOPROLOL TARTRATE 50 MG PO TABS
50.0000 mg | ORAL_TABLET | Freq: Every day | ORAL | Status: DC
  Administered 2020-03-16: 50 mg via ORAL
  Filled 2020-03-15: qty 1

## 2020-03-15 MED ORDER — ACETAMINOPHEN 325 MG PO TABS
650.0000 mg | ORAL_TABLET | Freq: Four times a day (QID) | ORAL | Status: DC | PRN
  Administered 2020-03-16: 650 mg via ORAL
  Filled 2020-03-15: qty 2

## 2020-03-15 MED ORDER — CLOPIDOGREL BISULFATE 75 MG PO TABS
75.0000 mg | ORAL_TABLET | Freq: Every morning | ORAL | Status: DC
  Administered 2020-03-16: 75 mg via ORAL
  Filled 2020-03-15: qty 1

## 2020-03-15 MED ORDER — POTASSIUM CHLORIDE CRYS ER 20 MEQ PO TBCR
40.0000 meq | EXTENDED_RELEASE_TABLET | Freq: Once | ORAL | Status: AC
  Administered 2020-03-15: 40 meq via ORAL
  Filled 2020-03-15: qty 2

## 2020-03-15 MED ORDER — ONDANSETRON HCL 4 MG PO TABS: 4.0000 mg | ORAL_TABLET | Freq: Four times a day (QID) | ORAL | Status: DC | PRN

## 2020-03-15 MED ORDER — PANTOPRAZOLE SODIUM 40 MG PO TBEC
40.0000 mg | DELAYED_RELEASE_TABLET | Freq: Every day | ORAL | Status: DC
  Administered 2020-03-16: 40 mg via ORAL
  Filled 2020-03-15: qty 1

## 2020-03-15 NOTE — ED Triage Notes (Signed)
Pt lives at home with caregivers, pt passed out at the table, no fall.  Pt with hx of same when her BP gets low.  [facility] reports CBG 115 and SBP 120.  Pt had emesis x 1.  Pt denies any pain at present or nausea.

## 2020-03-15 NOTE — ED Notes (Signed)
CRITICAL VALUE ALERT  Critical Value:  Potassium 2.7  Date & Time Notied:  03/15/20 & 1700hr  Provider Notified: Dr. Rogene Houston Orders Received/Actions taken: Notified

## 2020-03-15 NOTE — ED Provider Notes (Signed)
[facility] EMERGENCY DEPARTMENT Provider Note   CSN: [id number] Arrival date & time: 03/15/20  1345     History Chief Complaint  Patient presents with  . Loss of Consciousness    Claire West is a 79 y.o. female.  HPI   Patient presented to the emergency room for evaluation after a syncopal episode..  Patient was having breakfast with her caregivers.  They noticed that she suddenly seemed listless and then had a syncopal episode.  Patient had an episode of emesis.  The symptoms resolved and now she is alert and awake.  Patient denies any trouble with headache or chest pain.  She denies any abdominal pain.  No diarrhea recently.  No fevers or chills.  No dysuria.  She does have history of prior strokes.  Past Medical History:  Diagnosis Date  . Arthritis   . Depression   . Gait instability    walks with cane  . GERD (gastroesophageal reflux disease)   . History of blood transfusion   . Hyperlipemia   . Hypertension   . Knee pain   . Metabolic encephalopathy   . Mild cognitive impairment with memory loss   . Seasonal allergies   . Skin cancer     Patient Active Problem List   Diagnosis Date Noted  . History of ischemic right PCA stroke 02/18/2020  . Acute CVA (cerebrovascular accident) ([place]) 12/25/2019  . Restlessness and agitation 12/17/2019  . Vascular dementia with behavior disturbance ([place]) 12/17/2019  . Mild cognitive impairment 06/25/2019  . Depression 06/25/2019  . Falls frequently 10/28/2018  . TIA (transient ischemic attack) 08/23/2018  . Delirium in remission 07/07/2018  . OA (osteoarthritis) of knee 05/05/2018  . Amnestic MCI (mild cognitive impairment with memory loss) 12/24/2017  . Acute metabolic encephalopathy 85/11/7739  . Altered mental status 11/06/2017  . Hyponatremia 02/12/2016  . Hypokalemia 02/12/2016  . Dehydration 02/12/2016  . Elevated LFTs 02/12/2016  . HTN (hypertension) 02/12/2016  . GERD (gastroesophageal reflux disease)  02/12/2016    Past Surgical History:  Procedure Laterality Date  . ABDOMINAL HYSTERECTOMY    . ARTHROSCOPIC REPAIR ACL Left   . COLONOSCOPY    . COLONOSCOPY N/A 11/23/2015   Procedure: COLONOSCOPY;  Surgeon: Rogene Houston, MD;  [facility];  Service: Endoscopy;  Laterality: N/A;  830  . EYE SURGERY Bilateral    Cataracts  . SKIN CANCER EXCISION     Right shoulder  . TOTAL KNEE ARTHROPLASTY Left 05/05/2018   Procedure: LEFT TOTAL KNEE ARTHROPLASTY;  Surgeon: Gaynelle Arabian, MD;  Location: [facility];  Service: Orthopedics;  Laterality: Left;  with block  . TOTAL KNEE ARTHROPLASTY Right 06/22/2019   Procedure: TOTAL KNEE ARTHROPLASTY;  Surgeon: Gaynelle Arabian, MD;  Location: [facility];  Service: Orthopedics;  Laterality: Right;  55min     OB History    Gravida  1   Para  1   Term  1   Preterm      AB      Living  1     SAB      TAB      Ectopic      Multiple      Live Births              Family History  Problem Relation Age of Onset  . Lung cancer Mother   . Lung cancer Sister     Social History   Tobacco Use  . Smoking status: Never Smoker  .  Smokeless tobacco: Never Used  Substance Use Topics  . Alcohol use: Not Currently    Comment: occ  . Drug use: No    Home Medications Prior to Admission medications   Medication Sig Start Date End Date Taking? Authorizing Provider  acetaminophen (TYLENOL) 500 MG tablet Take 500 mg by mouth every 4 (four) hours as needed for mild pain.     [provider]  ALPRAZolam Duanne Moron) 0.5 MG tablet Take 1 tablet (0.5 mg total) by mouth at bedtime as needed for anxiety. 12/28/19   Swayze, Ava, DO  aspirin EC 81 MG tablet Take 81 mg by mouth daily.    [provider]  calcium carbonate (TUMS - DOSED IN MG ELEMENTAL CALCIUM) 500 MG chewable tablet Chew 1 tablet by mouth daily as needed for indigestion or heartburn.    [provider]  Cholecalciferol (VITAMIN D3 PO) Take 1 capsule by mouth  daily.    [provider]  clopidogrel (PLAVIX) 75 MG tablet Take 1 tablet (75 mg total) by mouth daily. 12/29/19   Swayze, Ava, DO  escitalopram (LEXAPRO) 10 MG tablet Take 20 mg by mouth at bedtime. 03/07/18   [provider]  hydrochlorothiazide (HYDRODIURIL) 25 MG tablet Take 25 mg by mouth daily. 10/05/19   [provider]  metoprolol tartrate (LOPRESSOR) 50 MG tablet Take 50 mg by mouth 2 (two) times daily. 11/21/19   [provider]  Multiple Vitamin (MULTIVITAMIN ADULT PO) Take 1 tablet by mouth daily at 12 noon.    [provider]  olmesartan (BENICAR) 20 MG tablet Take 20 mg by mouth in the morning and at bedtime.    [provider]  Omega-3 Fatty Acids (FISH OIL PO) Take 1 capsule by mouth daily.    [provider]  Omega-3 Krill Oil 500 MG CAPS Take 1 capsule by mouth daily.    [provider]  omeprazole (PRILOSEC) 20 MG capsule Take 20-40 mg by mouth See admin instructions. Alternates taking 40mg  daily with 20mg  04/18/15   [provider]  potassium chloride (K-DUR) 10 MEQ tablet Take 1 tablet (10 mEq total) by mouth daily. Patient taking differently: Take 10 mEq by mouth daily with supper.  11/04/17   Noemi Chapel, MD  pravastatin (PRAVACHOL) 20 MG tablet Take 20 mg by mouth daily.    [provider]  Probiotic Product (PROBIOTIC-10 PO) Take 1 capsule by mouth daily. RENEW Probiotic    [provider]  TURMERIC PO Take 1 capsule by mouth daily.    [provider]    Allergies    Penicillins, Adhesive [tape], Codeine, Neosporin [neomycin-bacitracin zn-polymyx], and Sulfa antibiotics  Review of Systems   Review of Systems  All other systems reviewed and are negative.   Physical Exam Updated Vital Signs BP 125/68 (BP Location: Right Arm)   Pulse 68   Temp 97.9 F (36.6 C) (Oral)   Resp 12   Ht 1.676 m (5\' 6" )   Wt 54.4 kg   SpO2 99%   BMI 19.37 kg/m   Physical  Exam Vitals and nursing note reviewed.  Constitutional:      General: She is not in acute distress.    Appearance: She is well-developed.  HENT:     Head: Normocephalic and atraumatic.     Right Ear: External ear normal.     Left Ear: External ear normal.  Eyes:     General: No scleral icterus.       Right eye:  No discharge.        Left eye: No discharge.     Conjunctiva/sclera: Conjunctivae normal.  Neck:     Trachea: No tracheal deviation.  Cardiovascular:     Rate and Rhythm: Normal rate and regular rhythm.  Pulmonary:     Effort: Pulmonary effort is normal. No respiratory distress.     Breath sounds: Normal breath sounds. No stridor. No wheezing or rales.  Abdominal:     General: Bowel sounds are normal. There is no distension.     Palpations: Abdomen is soft.     Tenderness: There is no abdominal tenderness. There is no guarding or rebound.  Musculoskeletal:        General: No tenderness.     Cervical back: Neck supple.  Skin:    General: Skin is warm and dry.     Findings: No rash.  Neurological:     Mental Status: She is alert and oriented to person, place, and time.     Cranial Nerves: No cranial nerve deficit (No facial droop, extraocular movements intact, tongue midline ).     Sensory: No sensory deficit.     Motor: No abnormal muscle tone or seizure activity.     Coordination: Coordination normal.     Comments: No pronator drift bilateral upper extrem, able to hold both legs off bed for 5 seconds, sensation intact in all extremities, no visual field cuts, no left or right sided neglect, normal finger-nose exam bilaterally, no nystagmus noted      ED Results / Procedures / Treatments   Labs (all labs ordered are listed, but only abnormal results are displayed) Labs Reviewed  PROTIME-INR  APTT  CBC  DIFFERENTIAL  COMPREHENSIVE METABOLIC PANEL  RAPID URINE DRUG SCREEN, HOSP PERFORMED  URINALYSIS, ROUTINE W REFLEX MICROSCOPIC    EKG EKG  Interpretation  Date/Time:  Tuesday March 15 2020 14:02:19 EDT Ventricular Rate:  64 PR Interval:    QRS Duration: 103 QT Interval:  473 QTC Calculation: 489 R Axis:   -30 Text Interpretation: Sinus rhythm Left ventricular hypertrophy Anterior Q waves, possibly due to LVH No significant change since last tracing Confirmed by Dorie Rank [phone]) on 03/15/2020 2:41:49 PM   Radiology CT HEAD WO CONTRAST  Result Date: 03/15/2020 CLINICAL DATA:  Vomiting. EXAM: CT HEAD WITHOUT CONTRAST TECHNIQUE: Contiguous axial images were obtained from the base of the skull through the vertex without intravenous contrast. COMPARISON:  December 25, 2019 FINDINGS: Brain: There is mild cerebral atrophy with widening of the extra-axial spaces and ventricular dilatation. There are areas of decreased attenuation within the white matter tracts of the supratentorial brain, consistent with microvascular disease changes. A large area of cortical encephalomalacia, with adjacent chronic white matter low attenuation, is seen within the right occipital lobe. Vascular: No hyperdense vessel or unexpected calcification. Skull: Normal. Negative for fracture or focal lesion. Sinuses/Orbits: No acute finding. Other: None. IMPRESSION: 1. Generalized cerebral atrophy. 2. Chronic right occipital lobe infarct. 3. No acute intracranial abnormality. Electronically Signed   By: Virgina Norfolk M.D.   On: 03/15/2020 16:13    Procedures Procedures (including critical care time)  Medications Ordered in ED Medications - No data to display  ED Course  I have reviewed the triage vital signs and the nursing notes.  Pertinent labs & imaging results that were available during my care of the patient were reviewed by me and considered in my medical decision making (see chart for details).    MDM Rules/Calculators/A&P  Doubt acute stroke at this time based on her presentation.  Symptoms more consistent with a syncopal episode.   No seizure activity described.  Hemodynamically stable at this time.  Labs and CT scan have been ordered.  Dr Rogene Houston will assume care at this time.  4:16 PM  Final Clinical Impression(s) / ED Diagnoses Final diagnoses:  Syncope and collapse      Dorie Rank, MD 03/15/20 1616

## 2020-03-15 NOTE — ED Notes (Signed)
Pt bed pads, gown, and purewick changed due to leaking. Purewick replaced and pt cleaned up.

## 2020-03-15 NOTE — H&P (Signed)
History and Physical    Claire West WJX:[id number] DOB: 1941-03-27 DOA: 03/15/2020  PCP: Sharilyn Sites, MD   Patient coming from: Home  I have personally briefly reviewed patient's old medical records in [place]  Chief Complaint: Passing out  HPI: Claire West is a 79 y.o. female with medical history significant for hypertension, dementia, CVA, falls. Brought to the ED with reports of a witnessed syncopal episode.  Patient was sitting at the table having breakfast with her caregivers when suddenly she passed out.  Patient knew she was about to pass out and told family likewise.  Patient was out for about 1 minute at the most, and just prior to passing out, patient vomited. Patient's daughter is present at bedside and assist with the history.  Similar occurrence about 2 weeks ago, patient passed in the bathroom, lasted about 1 minute, patient also vomited during episode, family was also present at that time.  Patient's daughter reports patient at baseline has episodes of dizziness. No difficulty breathing no cough, no chest pain, no leg swelling, no vomiting no loose stools, and she has maintained good oral intake.  ED Course: Blood pressure systolic 213-086V.  O2 sats greater than 96% on room air.  Potassium 2.7.  Sodium 132.  Creatinine baseline 0.72.  Head CT shows chronic right occipital lobe infarct, cerebral atrophy.  EKG showed sinus rhythm.  Hospitalist to admit for syncope.  Review of Systems: As per HPI all other systems reviewed and negative.  Past Medical History:  Diagnosis Date  . Arthritis   . Depression   . Gait instability    walks with cane  . GERD (gastroesophageal reflux disease)   . History of blood transfusion   . Hyperlipemia   . Hypertension   . Knee pain   . Metabolic encephalopathy   . Mild cognitive impairment with memory loss   . Seasonal allergies   . Skin cancer     Past Surgical History:  Procedure Laterality Date  . ABDOMINAL  HYSTERECTOMY    . ARTHROSCOPIC REPAIR ACL Left   . COLONOSCOPY    . COLONOSCOPY N/A 11/23/2015   Procedure: COLONOSCOPY;  Surgeon: Rogene Houston, MD;  [facility];  Service: Endoscopy;  Laterality: N/A;  830  . EYE SURGERY Bilateral    Cataracts  . SKIN CANCER EXCISION     Right shoulder  . TOTAL KNEE ARTHROPLASTY Left 05/05/2018   Procedure: LEFT TOTAL KNEE ARTHROPLASTY;  Surgeon: Gaynelle Arabian, MD;  Location: [facility];  Service: Orthopedics;  Laterality: Left;  with block  . TOTAL KNEE ARTHROPLASTY Right 06/22/2019   Procedure: TOTAL KNEE ARTHROPLASTY;  Surgeon: Gaynelle Arabian, MD;  Location: [facility];  Service: Orthopedics;  Laterality: Right;  28min     reports that she has never smoked. She has never used smokeless tobacco. She reports previous alcohol use. She reports that she does not use drugs.  Allergies  Allergen Reactions  . Penicillins Anaphylaxis, Swelling and Rash    Oral rash/peeling  **Has tolerated Rocephin, Vantin since 2017**    . Adhesive [Tape] Itching and Rash    Irritation at site  . Codeine Hives and Rash  . Neosporin [Neomycin-Bacitracin Zn-Polymyx] Rash  . Sulfa Antibiotics Hives and Rash    Family History  Problem Relation Age of Onset  . Lung cancer Mother   . Lung cancer Sister     Prior to Admission medications   Medication Sig Start Date End Date Taking? Authorizing Provider  acetaminophen (TYLENOL) 500 MG tablet Take 500 mg by mouth at bedtime. *May take one tablet daily as needed for pain*   Yes [provider]  ALPRAZolam (XANAX) 0.5 MG tablet Take 1 tablet (0.5 mg total) by mouth at bedtime as needed for anxiety. 12/28/19  Yes Swayze, Ava, DO  aspirin EC 81 MG tablet Take 81 mg by mouth every morning.    Yes [provider]  calcium carbonate (TUMS - DOSED IN MG ELEMENTAL CALCIUM) 500 MG chewable tablet Chew 1 tablet by mouth daily as needed for indigestion or heartburn.   Yes [provider]    Cholecalciferol (VITAMIN D3 PO) Take 1 capsule by mouth daily at 12 noon.    Yes [provider]  clopidogrel (PLAVIX) 75 MG tablet Take 1 tablet (75 mg total) by mouth daily. Patient taking differently: Take 75 mg by mouth every morning.  12/29/19  Yes Swayze, Ava, DO  Emollient (CERAVE EX) Apply 1 application topically daily as needed (for itching and dryness).   Yes [provider]  escitalopram (LEXAPRO) 20 MG tablet Take 20 mg by mouth at bedtime.  03/07/18  Yes [provider]  hydrochlorothiazide (HYDRODIURIL) 25 MG tablet Take 25 mg by mouth daily. 10/05/19  Yes [provider]  hydrocortisone cream 1 % Apply 1 application topically daily as needed for itching.   Yes [provider]  metoprolol tartrate (LOPRESSOR) 50 MG tablet Take 25-50 mg by mouth See admin instructions. 50mg  in the morning and 25mg  in the evening 11/21/19  Yes [provider]  Multiple Vitamin (MULTIVITAMIN ADULT PO) Take 1 tablet by mouth daily at 12 noon.   Yes [provider]  olmesartan (BENICAR) 20 MG tablet Take 20 mg by mouth in the morning and at bedtime.   Yes [provider]  Omega-3 Fatty Acids (FISH OIL PO) Take 1 capsule by mouth every evening.    Yes [provider]  Omega-3 Krill Oil 500 MG CAPS Take 1 capsule by mouth every evening.    Yes [provider]  omeprazole (PRILOSEC) 20 MG capsule Take 20 mg by mouth 2 (two) times daily before a meal.  04/18/15  Yes [provider]  potassium chloride (K-DUR) 10 MEQ tablet Take 1 tablet (10 mEq total) by mouth daily. Patient taking differently: Take 10 mEq by mouth daily with supper.  11/04/17  Yes Noemi Chapel, MD  pravastatin (PRAVACHOL) 20 MG tablet Take 20 mg by mouth at bedtime.    Yes [provider]  Probiotic Product (PROBIOTIC-10 PO) Take 1 capsule by mouth daily. RENEW Probiotic   Yes [provider]  triamcinolone cream (KENALOG) 0.1 %  Apply 1 application topically daily as needed (for itching).   Yes [provider]  TURMERIC PO Take 1 capsule by mouth daily at 12 noon.    Yes [provider]  methylPREDNISolone (MEDROL DOSEPAK) 4 MG TBPK tablet Take 4-24 mg by mouth See admin instructions. see package as directed for 6 days COMPLETED ON 03/13/2020 03/08/20   [provider]    Physical Exam: Vitals:   03/15/20 1630 03/15/20 1645 03/15/20 1700 03/15/20 1715  BP:   138/67   Pulse:      Resp: 14 12 18 17   Temp:      TempSrc:      SpO2:      Weight:      Height:        Constitutional: NAD, calm, comfortable Vitals:  03/15/20 1630 03/15/20 1645 03/15/20 1700 03/15/20 1715  BP:   138/67   Pulse:      Resp: 14 12 18 17   Temp:      TempSrc:      SpO2:      Weight:      Height:       Eyes: PERRL, lids and conjunctivae normal ENMT: Mucous membranes are moist. Posterior pharynx clear of any exudate or lesions.  Neck: normal, supple, no masses, no thyromegaly Respiratory: clear to auscultation bilaterally, no wheezing, no crackles. Normal respiratory effort. No accessory muscle use.  Cardiovascular: Regular rate and rhythm, no murmurs / rubs / gallops. No extremity edema. 2+ pedal pulses.  Abdomen: no tenderness, no masses palpated. No hepatosplenomegaly. Bowel sounds positive.  Musculoskeletal: no clubbing / cyanosis. No joint deformity upper and lower extremities. Good ROM, no contractures. Normal muscle tone.  Skin: no rashes, lesions, ulcers. No induration Neurologic: 5/5 strength bilateral upper extremity, 4+/5 strength in bilateral lower extremity, speech fluent, no facial asymmetry.  Psychiatric: Normal judgment and insight. Alert and oriented x 2. Normal mood.   Labs on Admission: I have personally reviewed following labs and imaging studies  CBC: Recent Labs  Lab 03/15/20 1554  WBC 9.4  NEUTROABS 6.4  HGB 13.5  HCT 38.8  MCV 95.3  PLT 601   Basic Metabolic Panel: Recent  Labs  Lab 03/15/20 1554  NA 132*  K 2.7*  CL 92*  CO2 29  GLUCOSE 109*  BUN 16  CREATININE 0.72  CALCIUM 8.7*   Liver Function Tests: Recent Labs  Lab 03/15/20 1554  AST 20  ALT 19  ALKPHOS 46  BILITOT 0.5  PROT 6.4*  ALBUMIN 3.5   Coagulation Profile: Recent Labs  Lab 03/15/20 1554  INR 1.0    Radiological Exams on Admission: CT HEAD WO CONTRAST  Result Date: 03/15/2020 CLINICAL DATA:  Vomiting. EXAM: CT HEAD WITHOUT CONTRAST TECHNIQUE: Contiguous axial images were obtained from the base of the skull through the vertex without intravenous contrast. COMPARISON:  December 25, 2019 FINDINGS: Brain: There is mild cerebral atrophy with widening of the extra-axial spaces and ventricular dilatation. There are areas of decreased attenuation within the white matter tracts of the supratentorial brain, consistent with microvascular disease changes. A large area of cortical encephalomalacia, with adjacent chronic white matter low attenuation, is seen within the right occipital lobe. Vascular: No hyperdense vessel or unexpected calcification. Skull: Normal. Negative for fracture or focal lesion. Sinuses/Orbits: No acute finding. Other: None. IMPRESSION: 1. Generalized cerebral atrophy. 2. Chronic right occipital lobe infarct. 3. No acute intracranial abnormality. Electronically Signed   By: Virgina Norfolk M.D.   On: 03/15/2020 16:13    EKG: Independently reviewed.,  Rate 64, QTc 489.  None specific T wave abnormalities that have not significantly changed from prior.  Assessment/Plan Active Problems:   Syncope   Syncope-with prodrome, history of dizziness, CVA.  Hypokalemia of 2.7 not severe enough to explain syncopal episode.  No GI, respiratory, GU symptoms, or new neurologic symptoms.  Head CT shows generalized cerebral atrophy and chronic right occipital lobe infarct.  EKG without significant change compared to prior.  Recent echo 12/2019, EF 55 to 60% with grade 1 diastolic  dysfunction, mild aortic regurgitation.  - Follows with Dr. Bronson Ing, has coronary CT angiography planned as outpatient for evaluation of angina pectoris. -Obtain orthostatic vital -Trend troponin -Monitor on telemetry.  Hypokalemia -Check magnesium -Replete K -BMP a.m.  Hypertension-stable. -Resume home metoprolol, HCTZ, olmesartan  CVA, dementia, falls-recent hospitalization at [facility] 12/2019 for right PCA territory infarct. - resume Plavix, statins  DVT prophylaxis: Lovenox Code Status: Full code Family Communication: Daughter at bedside Disposition Plan: 1 to 2 days Consults called: None Admission status: Perseveration, telemetry   Bethena Roys MD [facility]  03/15/2020, 9:42 PM

## 2020-03-15 NOTE — ED Provider Notes (Signed)
Patient's work-up without acute findings other than hypokalemia.  But will require admission for the syncopal episode that occurred earlier today.  Patient without any complaints right now.  Covid testing ordered.  Patient has had both vaccines.  Initially it was not certain but has been verified.  Cussed with the hospitalist who will admit.   Fredia Sorrow, MD 03/15/20 [phone]

## 2020-03-16 ENCOUNTER — Ambulatory Visit: Payer: Self-pay | Admitting: *Deleted

## 2020-03-16 ENCOUNTER — Other Ambulatory Visit: Payer: Self-pay | Admitting: *Deleted

## 2020-03-16 DIAGNOSIS — E860 Dehydration: Secondary | ICD-10-CM | POA: Diagnosis not present

## 2020-03-16 DIAGNOSIS — R55 Syncope and collapse: Secondary | ICD-10-CM | POA: Diagnosis not present

## 2020-03-16 DIAGNOSIS — E871 Hypo-osmolality and hyponatremia: Secondary | ICD-10-CM | POA: Diagnosis not present

## 2020-03-16 DIAGNOSIS — E876 Hypokalemia: Secondary | ICD-10-CM

## 2020-03-16 LAB — BASIC METABOLIC PANEL
Anion gap: 7 (ref 5–15)
BUN: 10 mg/dL (ref 8–23)
CO2: 28 mmol/L (ref 22–32)
Calcium: 9.1 mg/dL (ref 8.9–10.3)
Chloride: 96 mmol/L — ABNORMAL LOW (ref 98–111)
Creatinine, Ser: 0.57 mg/dL (ref 0.44–1.00)
GFR calc Af Amer: >60 mL/min (ref >60)
GFR calc non Af Amer: >60 mL/min (ref >60)
Glucose, Bld: 92 mg/dL (ref 70–99)
Potassium: 4.6 mmol/L (ref 3.5–5.1)
Sodium: 131 mmol/L — ABNORMAL LOW (ref 135–145)

## 2020-03-16 LAB — TROPONIN I (HIGH SENSITIVITY): Troponin I (High Sensitivity): 4 ng/L (ref <18)

## 2020-03-16 NOTE — Progress Notes (Deleted)
Physician Discharge Summary  DESHAUNA CAYSON JJK:[id number] DOB: [birth year] DOA: 03/15/2020  PCP: Sharilyn Sites, MD  Admit date: 03/15/2020 Discharge date: 03/16/2020  Admitted From: Home Disposition: Home  Recommendations for Outpatient Follow-up:  1. Follow up with PCP in 1-2 weeks 2. Please obtain BMP/CBC in one week 3. Follow-up with cardiology as previously scheduled 4. She will be referred for outpatient event monitor  Home Health: Home health RN, PT Equipment/Devices:  Discharge Condition: Stable CODE STATUS: Full code Diet recommendation: Heart healthy  Brief/Interim Summary: HPI: Claire West is a 79 y.o. female with medical history significant for hypertension, dementia, CVA, falls. Brought to the ED with reports of a witnessed syncopal episode.  Patient was sitting at the table having breakfast with her caregivers when suddenly she passed out.  Patient knew she was about to pass out and told family likewise.  Patient was out for about 1 minute at the most, and just prior to passing out, patient vomited. Patient's daughter is present at bedside and assist with the history.  Similar occurrence about 2 weeks ago, patient passed in the bathroom, lasted about 1 minute, patient also vomited during episode, family was also present at that time.  Patient's daughter reports patient at baseline has episodes of dizziness. No difficulty breathing no cough, no chest pain, no leg swelling, no vomiting no loose stools, and she has maintained good oral intake.  Discharge Diagnoses:  Active Problems:   Hyponatremia   Hypokalemia   Dehydration   Vascular dementia with behavior disturbance (HCC)   Syncope  1. Syncope.  Suspect this was vasovagal in origin.  The patient did have nausea and vomiting prior to episode.  She did not have any postictal phase.  Cardiac enzymes will be negative.  She did not have any significant findings on telemetry.  She is not orthostatic.  She did receive IV  fluids and was mildly dehydrated.  She will be referred for outpatient event monitor. 2. Hypokalemia.  This was replaced.  At this point, will discontinue hydrochlorothiazide. 3. Hyponatremia.  Likely also related to thiazide diuretic. 4. Hypertension.  Stable on metoprolol and ARB. 5. Recent CVA, history of dementia.  Patient was able to ambulate with the assistance of staff.  She has 24-hour caregivers at home.  Will order home health physical therapy and RN.  Discharge Instructions  Discharge Instructions    Diet - low sodium heart healthy   Complete by: As directed    Increase activity slowly   Complete by: As directed      Allergies as of 03/16/2020      Reactions   Penicillins Anaphylaxis, Swelling, Rash   Oral rash/peeling **Has tolerated Rocephin, Vantin since 2017**   Adhesive [tape] Itching, Rash   Irritation at site   Codeine Hives, Rash   Neosporin [neomycin-bacitracin Zn-polymyx] Rash   Sulfa Antibiotics Hives, Rash      Medication List    STOP taking these medications   hydrochlorothiazide 25 MG tablet Commonly known as: HYDRODIURIL   methylPREDNISolone 4 MG Tbpk tablet Commonly known as: MEDROL DOSEPAK     TAKE these medications   acetaminophen 500 MG tablet Commonly known as: TYLENOL Take 500 mg by mouth at bedtime. *May take one tablet daily as needed for pain*   ALPRAZolam 0.5 MG tablet Commonly known as: XANAX Take 1 tablet (0.5 mg total) by mouth at bedtime as needed for anxiety.   aspirin EC 81 MG tablet Take 81 mg by mouth every morning.  calcium carbonate 500 MG chewable tablet Commonly known as: TUMS - dosed in mg elemental calcium Chew 1 tablet by mouth daily as needed for indigestion or heartburn.   CERAVE EX Apply 1 application topically daily as needed (for itching and dryness).   clopidogrel 75 MG tablet Commonly known as: PLAVIX Take 1 tablet (75 mg total) by mouth daily. What changed: when to take this   escitalopram 20 MG  tablet Commonly known as: LEXAPRO Take 20 mg by mouth at bedtime.   FISH OIL PO Take 1 capsule by mouth every evening.   hydrocortisone cream 1 % Apply 1 application topically daily as needed for itching.   metoprolol tartrate 50 MG tablet Commonly known as: LOPRESSOR Take 25-50 mg by mouth See admin instructions. 50mg  in the morning and 25mg  in the evening   MULTIVITAMIN ADULT PO Take 1 tablet by mouth daily at 12 noon.   olmesartan 20 MG tablet Commonly known as: BENICAR Take 20 mg by mouth in the morning and at bedtime.   Omega-3 Krill Oil 500 MG Caps Take 1 capsule by mouth every evening.   omeprazole 20 MG capsule Commonly known as: PRILOSEC Take 20 mg by mouth 2 (two) times daily before a meal.   potassium chloride 10 MEQ tablet Commonly known as: KLOR-CON Take 1 tablet (10 mEq total) by mouth daily. What changed: when to take this   pravastatin 20 MG tablet Commonly known as: PRAVACHOL Take 20 mg by mouth at bedtime.   PROBIOTIC-10 PO Take 1 capsule by mouth daily. RENEW Probiotic   triamcinolone cream 0.1 % Commonly known as: KENALOG Apply 1 application topically daily as needed (for itching).   TURMERIC PO Take 1 capsule by mouth daily at 12 noon.   VITAMIN D3 PO Take 1 capsule by mouth daily at 12 noon.       Allergies  Allergen Reactions   Penicillins Anaphylaxis, Swelling and Rash    Oral rash/peeling  **Has tolerated Rocephin, Vantin since 2017**     Adhesive [Tape] Itching and Rash    Irritation at site   Codeine Hives and Rash   Neosporin [Neomycin-Bacitracin Zn-Polymyx] Rash   Sulfa Antibiotics Hives and Rash    Consultations:     Procedures/Studies: CT HEAD WO CONTRAST  Result Date: 03/15/2020 CLINICAL DATA:  Vomiting. EXAM: CT HEAD WITHOUT CONTRAST TECHNIQUE: Contiguous axial images were obtained from the base of the skull through the vertex without intravenous contrast. COMPARISON:  December 25, 2019 FINDINGS: Brain:  There is mild cerebral atrophy with widening of the extra-axial spaces and ventricular dilatation. There are areas of decreased attenuation within the white matter tracts of the supratentorial brain, consistent with microvascular disease changes. A large area of cortical encephalomalacia, with adjacent chronic white matter low attenuation, is seen within the right occipital lobe. Vascular: No hyperdense vessel or unexpected calcification. Skull: Normal. Negative for fracture or focal lesion. Sinuses/Orbits: No acute finding. Other: None. IMPRESSION: 1. Generalized cerebral atrophy. 2. Chronic right occipital lobe infarct. 3. No acute intracranial abnormality. Electronically Signed   By: Virgina Norfolk M.D.   On: 03/15/2020 16:13      Subjective: Feeling better.  No nausea or vomiting.  No dizziness.  Discharge Exam: Vitals:   03/16/20 0000 03/16/20 0430 03/16/20 1011 03/16/20 1554  BP: (!) 146/68 (!) 151/71 (!) 152/72 (!) 150/68  Pulse: 63 (!) 58 64 64  Resp: 20 20  16   Temp: 98.2 F (36.8 C) 97.9 F (36.6 C)  97.6 F (  36.4 C)  TempSrc: Oral Oral  Oral  SpO2: 99% 99%  100%  Weight:      Height:        General: Pt is alert, awake, not in acute distress Cardiovascular: RRR, S1/S2 +, no rubs, no gallops Respiratory: CTA bilaterally, no wheezing, no rhonchi Abdominal: Soft, NT, ND, bowel sounds + Extremities: no edema, no cyanosis    The results of significant diagnostics from this hospitalization (including imaging, microbiology, ancillary and laboratory) are listed below for reference.     Microbiology: Recent Results (from the past 240 hour(s))  SARS Coronavirus 2 by RT PCR (hospital order, performed in [facility] hospital lab) Nasopharyngeal Nasopharyngeal Swab     Status: None   Collection Time: 03/15/20  5:12 PM   Specimen: Nasopharyngeal Swab  Result Value Ref Range Status   SARS Coronavirus 2 NEGATIVE NEGATIVE Final    Comment: (NOTE) SARS-CoV-2 target nucleic acids  are NOT DETECTED. The SARS-CoV-2 RNA is generally detectable in upper and lower respiratory specimens during the acute phase of infection. The lowest concentration of SARS-CoV-2 viral copies this assay can detect is 250 copies / mL. A negative result does not preclude SARS-CoV-2 infection and should not be used as the sole basis for treatment or other patient management decisions.  A negative result may occur with improper specimen collection / handling, submission of specimen other than nasopharyngeal swab, presence of viral mutation(s) within the areas targeted by this assay, and inadequate number of viral copies (<250 copies / mL). A negative result must be combined with clinical observations, patient history, and epidemiological information. Fact Sheet for Patients:   StrictlyIdeas.no Fact Sheet for Healthcare Providers: BankingDealers.co.za This test is not yet approved or cleared  by the [place] FDA and has been authorized for detection and/or diagnosis of SARS-CoV-2 by FDA under an Emergency Use Authorization (EUA).  This EUA will remain in effect (meaning this test can be used) for the duration of the COVID-19 declaration under Section 564(b)(1) of the Act, 21 U.S.C. section 360bbb-3(b)(1), unless the authorization is terminated or revoked sooner. Performed at [facility], [place], [address], [place], [place] [address]      Labs: BNP (last 3 results) Recent Labs    12/25/19 1302  BNP 98.3   Basic Metabolic Panel: Recent Labs  Lab 03/15/20 1554 03/15/20 2145 03/16/20 0645  NA 132*  --  131*  K 2.7*  --  4.6  CL 92*  --  96*  CO2 29  --  28  GLUCOSE 109*  --  92  BUN 16  --  10  CREATININE 0.72  --  0.57  CALCIUM 8.7*  --  9.1  MG  --  2.0  --    Liver Function Tests: Recent Labs  Lab 03/15/20 1554  AST 20  ALT 19  ALKPHOS 46  BILITOT 0.5  PROT 6.4*  ALBUMIN 3.5   No results for input(s): LIPASE,  AMYLASE in the last 168 hours. No results for input(s): AMMONIA in the last 168 hours. CBC: Recent Labs  Lab 03/15/20 1554  WBC 9.4  NEUTROABS 6.4  HGB 13.5  HCT 38.8  MCV 95.3  PLT 276   Cardiac Enzymes: No results for input(s): CKTOTAL, CKMB, CKMBINDEX, TROPONINI in the last 168 hours. BNP: Invalid input(s): POCBNP CBG: No results for input(s): GLUCAP in the last 168 hours. D-Dimer No results for input(s): DDIMER in the last 72 hours. Hgb A1c No results for input(s): HGBA1C in the last  72 hours. Lipid Profile No results for input(s): CHOL, HDL, LDLCALC, TRIG, CHOLHDL, LDLDIRECT in the last 72 hours. Thyroid function studies No results for input(s): TSH, T4TOTAL, T3FREE, THYROIDAB in the last 72 hours.  Invalid input(s): FREET3 Anemia work up No results for input(s): VITAMINB12, FOLATE, FERRITIN, TIBC, IRON, RETICCTPCT in the last 72 hours. Urinalysis    Component Value Date/Time   COLORURINE YELLOW 03/15/2020 1525   APPEARANCEUR HAZY (A) 03/15/2020 1525   LABSPEC 1.011 03/15/2020 1525   PHURINE 7.0 03/15/2020 1525   GLUCOSEU NEGATIVE 03/15/2020 1525   HGBUR NEGATIVE 03/15/2020 1525   BILIRUBINUR NEGATIVE 03/15/2020 1525   [place] 03/15/2020 1525   PROTEINUR NEGATIVE 03/15/2020 1525   NITRITE NEGATIVE 03/15/2020 1525   LEUKOCYTESUR MODERATE (A) 03/15/2020 1525   Sepsis Labs Invalid input(s): PROCALCITONIN,  WBC,  LACTICIDVEN Microbiology Recent Results (from the past 240 hour(s))  SARS Coronavirus 2 by RT PCR (hospital order, performed in [place] hospital lab) Nasopharyngeal Nasopharyngeal Swab     Status: None   Collection Time: 03/15/20  5:12 PM   Specimen: Nasopharyngeal Swab  Result Value Ref Range Status   SARS Coronavirus 2 NEGATIVE NEGATIVE Final    Comment: (NOTE) SARS-CoV-2 target nucleic acids are NOT DETECTED. The SARS-CoV-2 RNA is generally detectable in upper and lower respiratory specimens during the acute phase of infection. The  lowest concentration of SARS-CoV-2 viral copies this assay can detect is 250 copies / mL. A negative result does not preclude SARS-CoV-2 infection and should not be used as the sole basis for treatment or other patient management decisions.  A negative result may occur with improper specimen collection / handling, submission of specimen other than nasopharyngeal swab, presence of viral mutation(s) within the areas targeted by this assay, and inadequate number of viral copies (<250 copies / mL). A negative result must be combined with clinical observations, patient history, and epidemiological information. Fact Sheet for Patients:   StrictlyIdeas.no Fact Sheet for Healthcare Providers: BankingDealers.co.za This test is not yet approved or cleared  by the [place] FDA and has been authorized for detection and/or diagnosis of SARS-CoV-2 by FDA under an Emergency Use Authorization (EUA).  This EUA will remain in effect (meaning this test can be used) for the duration of the COVID-19 declaration under Section 564(b)(1) of the Act, 21 U.S.C. section 360bbb-3(b)(1), unless the authorization is terminated or revoked sooner. Performed at [facility], [address], [place], [place] [address]      Time coordinating discharge: 27mins  SIGNED:   Kathie Dike, MD  [facility] 03/16/2020, 7:27 PM   If 7PM-7AM, please contact night-coverage [email or web address]

## 2020-03-16 NOTE — Patient Outreach (Signed)
[facility]) Care Management  03/16/2020  DUDLEY COOLEY 06/01/1941 [id number]   Received [facility] Link / Epic in-basket message, states patient admitted 03/15/2020 for witnessed syncopal episode and remains inpatient.   No patient outreach at this time due to hospitalization.  Sent the following in-basket update to [facility] ( [facility] and [facility]): Gilles Chiquito,  Patient admitted 03/15/2020 for witnessed syncopal episode and remains inpatient at [facility].  Please advised when patient discharged.  I will close patient if hospitalized greater than 10 days, and if less than 10 days, I will follow up post hospital discharge.  Thanks, Amalia Greenhouse H. Annia Friendly, BSN, [place] [facility] Telephonic CM Phone: [phone] Fax: [phone]

## 2020-03-17 ENCOUNTER — Telehealth: Payer: Self-pay

## 2020-03-17 DIAGNOSIS — R55 Syncope and collapse: Secondary | ICD-10-CM

## 2020-03-17 NOTE — Telephone Encounter (Signed)
Order placed for 30 day Event Monitor with [employer].  Will be mailed to patient home.

## 2020-03-17 NOTE — Progress Notes (Deleted)
Physician Discharge Summary  Claire West YPP:[id number] DOB: 09-22-1941 DOA: 03/15/2020  PCP: Sharilyn Sites, MD  Admit date: 03/15/2020 Discharge date: 03/16/2020  Admitted From: Home Disposition: Home  Recommendations for Outpatient Follow-up:  1. Follow up with PCP in 1-2 weeks 2. Please obtain BMP/CBC in one week 3. Follow-up with cardiology as previously scheduled 4. She will be referred for outpatient event monitor  Home Health: Home health RN, PT Equipment/Devices:  Discharge Condition: Stable CODE STATUS: Full code Diet recommendation: Heart healthy  Brief/Interim Summary: HPI: LILIENNE West is a 79 y.o. female with medical history significant for hypertension, dementia, CVA, falls. Brought to the ED with reports of a witnessed syncopal episode.  Patient was sitting at the table having breakfast with her caregivers when suddenly she passed out.  Patient knew she was about to pass out and told family likewise.  Patient was out for about 1 minute at the most, and just prior to passing out, patient vomited. Patient's daughter is present at bedside and assist with the history.  Similar occurrence about 2 weeks ago, patient passed in the bathroom, lasted about 1 minute, patient also vomited during episode, family was also present at that time.  Patient's daughter reports patient at baseline has episodes of dizziness. No difficulty breathing no cough, no chest pain, no leg swelling, no vomiting no loose stools, and she has maintained good oral intake.  Discharge Diagnoses:  Active Problems:   Hyponatremia   Hypokalemia   Dehydration   Vascular dementia with behavior disturbance (HCC)   Syncope  1. Syncope.  Suspect this was vasovagal in origin.  The patient did have nausea and vomiting prior to episode.  She did not have any postictal phase.  Cardiac enzymes will be negative.  She did not have any significant findings on telemetry.  She is not orthostatic.  She did receive IV  fluids and was mildly dehydrated.  She will be referred for outpatient event monitor. 2. Hypokalemia.  This was replaced.  At this point, will discontinue hydrochlorothiazide. 3. Hyponatremia.  Likely also related to thiazide diuretic. 4. Hypertension.  Stable on metoprolol and ARB. 5. Recent CVA, history of dementia.  Patient was able to ambulate with the assistance of staff.  She has 24-hour caregivers at home.  Will order home health physical therapy and RN.  Discharge Instructions  Discharge Instructions    Diet - low sodium heart healthy   Complete by: As directed    Increase activity slowly   Complete by: As directed      Allergies as of 03/16/2020      Reactions   Penicillins Anaphylaxis, Swelling, Rash   Oral rash/peeling **Has tolerated Rocephin, Vantin since 2017**   Adhesive [tape] Itching, Rash   Irritation at site   Codeine Hives, Rash   Neosporin [neomycin-bacitracin Zn-polymyx] Rash   Sulfa Antibiotics Hives, Rash      Medication List    STOP taking these medications   hydrochlorothiazide 25 MG tablet Commonly known as: HYDRODIURIL   methylPREDNISolone 4 MG Tbpk tablet Commonly known as: MEDROL DOSEPAK     TAKE these medications   acetaminophen 500 MG tablet Commonly known as: TYLENOL Take 500 mg by mouth at bedtime. *May take one tablet daily as needed for pain*   ALPRAZolam 0.5 MG tablet Commonly known as: XANAX Take 1 tablet (0.5 mg total) by mouth at bedtime as needed for anxiety.   aspirin EC 81 MG tablet Take 81 mg by mouth every morning.  calcium carbonate 500 MG chewable tablet Commonly known as: TUMS - dosed in mg elemental calcium Chew 1 tablet by mouth daily as needed for indigestion or heartburn.   CERAVE EX Apply 1 application topically daily as needed (for itching and dryness).   clopidogrel 75 MG tablet Commonly known as: PLAVIX Take 1 tablet (75 mg total) by mouth daily. What changed: when to take this   escitalopram 20 MG  tablet Commonly known as: LEXAPRO Take 20 mg by mouth at bedtime.   FISH OIL PO Take 1 capsule by mouth every evening.   hydrocortisone cream 1 % Apply 1 application topically daily as needed for itching.   metoprolol tartrate 50 MG tablet Commonly known as: LOPRESSOR Take 25-50 mg by mouth See admin instructions. 50mg  in the morning and 25mg  in the evening   MULTIVITAMIN ADULT PO Take 1 tablet by mouth daily at 12 noon.   olmesartan 20 MG tablet Commonly known as: BENICAR Take 20 mg by mouth in the morning and at bedtime.   Omega-3 Krill Oil 500 MG Caps Take 1 capsule by mouth every evening.   omeprazole 20 MG capsule Commonly known as: PRILOSEC Take 20 mg by mouth 2 (two) times daily before a meal.   potassium chloride 10 MEQ tablet Commonly known as: KLOR-CON Take 1 tablet (10 mEq total) by mouth daily. What changed: when to take this   pravastatin 20 MG tablet Commonly known as: PRAVACHOL Take 20 mg by mouth at bedtime.   PROBIOTIC-10 PO Take 1 capsule by mouth daily. RENEW Probiotic   triamcinolone cream 0.1 % Commonly known as: KENALOG Apply 1 application topically daily as needed (for itching).   TURMERIC PO Take 1 capsule by mouth daily at 12 noon.   VITAMIN D3 PO Take 1 capsule by mouth daily at 12 noon.       Allergies  Allergen Reactions  . Penicillins Anaphylaxis, Swelling and Rash    Oral rash/peeling  **Has tolerated Rocephin, Vantin since 2017**    . Adhesive [Tape] Itching and Rash    Irritation at site  . Codeine Hives and Rash  . Neosporin [Neomycin-Bacitracin Zn-Polymyx] Rash  . Sulfa Antibiotics Hives and Rash    Consultations:     Procedures/Studies: CT HEAD WO CONTRAST  Result Date: 03/15/2020 CLINICAL DATA:  Vomiting. EXAM: CT HEAD WITHOUT CONTRAST TECHNIQUE: Contiguous axial images were obtained from the base of the skull through the vertex without intravenous contrast. COMPARISON:  December 25, 2019 FINDINGS: Brain:  There is mild cerebral atrophy with widening of the extra-axial spaces and ventricular dilatation. There are areas of decreased attenuation within the white matter tracts of the supratentorial brain, consistent with microvascular disease changes. A large area of cortical encephalomalacia, with adjacent chronic white matter low attenuation, is seen within the right occipital lobe. Vascular: No hyperdense vessel or unexpected calcification. Skull: Normal. Negative for fracture or focal lesion. Sinuses/Orbits: No acute finding. Other: None. IMPRESSION: 1. Generalized cerebral atrophy. 2. Chronic right occipital lobe infarct. 3. No acute intracranial abnormality. Electronically Signed   By: Virgina Norfolk M.D.   On: 03/15/2020 16:13      Subjective: Feeling better.  No nausea or vomiting.  No dizziness.  Discharge Exam: Vitals:   03/16/20 0000 03/16/20 0430 03/16/20 1011 03/16/20 1554  BP: (!) 146/68 (!) 151/71 (!) 152/72 (!) 150/68  Pulse: 63 (!) 58 64 64  Resp: 20 20  16   Temp: 98.2 F (36.8 C) 97.9 F (36.6 C)  97.6 F (  36.4 C)  TempSrc: Oral Oral  Oral  SpO2: 99% 99%  100%  Weight:      Height:        General: Pt is alert, awake, not in acute distress Cardiovascular: RRR, S1/S2 +, no rubs, no gallops Respiratory: CTA bilaterally, no wheezing, no rhonchi Abdominal: Soft, NT, ND, bowel sounds + Extremities: no edema, no cyanosis    The results of significant diagnostics from this hospitalization (including imaging, microbiology, ancillary and laboratory) are listed below for reference.     Microbiology: Recent Results (from the past 240 hour(s))  SARS Coronavirus 2 by RT PCR (hospital order, performed in [facility] hospital lab) Nasopharyngeal Nasopharyngeal Swab     Status: None   Collection Time: 03/15/20  5:12 PM   Specimen: Nasopharyngeal Swab  Result Value Ref Range Status   SARS Coronavirus 2 NEGATIVE NEGATIVE Final    Comment: (NOTE) SARS-CoV-2 target nucleic acids  are NOT DETECTED. The SARS-CoV-2 RNA is generally detectable in upper and lower respiratory specimens during the acute phase of infection. The lowest concentration of SARS-CoV-2 viral copies this assay can detect is 250 copies / mL. A negative result does not preclude SARS-CoV-2 infection and should not be used as the sole basis for treatment or other patient management decisions.  A negative result may occur with improper specimen collection / handling, submission of specimen other than nasopharyngeal swab, presence of viral mutation(s) within the areas targeted by this assay, and inadequate number of viral copies (<250 copies / mL). A negative result must be combined with clinical observations, patient history, and epidemiological information. Fact Sheet for Patients:   StrictlyIdeas.no Fact Sheet for Healthcare Providers: BankingDealers.co.za This test is not yet approved or cleared  by the [place] FDA and has been authorized for detection and/or diagnosis of SARS-CoV-2 by FDA under an Emergency Use Authorization (EUA).  This EUA will remain in effect (meaning this test can be used) for the duration of the COVID-19 declaration under Section 564(b)(1) of the Act, 21 U.S.C. section 360bbb-3(b)(1), unless the authorization is terminated or revoked sooner. Performed at [facility], [address] [place], [place] [address]      Labs: BNP (last 3 results) Recent Labs    12/25/19 1302  BNP 61.4   Basic Metabolic Panel: Recent Labs  Lab 03/15/20 1554 03/15/20 2145 03/16/20 0645  NA 132*  --  131*  K 2.7*  --  4.6  CL 92*  --  96*  CO2 29  --  28  GLUCOSE 109*  --  92  BUN 16  --  10  CREATININE 0.72  --  0.57  CALCIUM 8.7*  --  9.1  MG  --  2.0  --    Liver Function Tests: Recent Labs  Lab 03/15/20 1554  AST 20  ALT 19  ALKPHOS 46  BILITOT 0.5  PROT 6.4*  ALBUMIN 3.5   No results for input(s): LIPASE,  AMYLASE in the last 168 hours. No results for input(s): AMMONIA in the last 168 hours. CBC: Recent Labs  Lab 03/15/20 1554  WBC 9.4  NEUTROABS 6.4  HGB 13.5  HCT 38.8  MCV 95.3  PLT 276   Cardiac Enzymes: No results for input(s): CKTOTAL, CKMB, CKMBINDEX, TROPONINI in the last 168 hours. BNP: Invalid input(s): POCBNP CBG: No results for input(s): GLUCAP in the last 168 hours. D-Dimer No results for input(s): DDIMER in the last 72 hours. Hgb A1c No results for input(s): HGBA1C in the last  72 hours. Lipid Profile No results for input(s): CHOL, HDL, LDLCALC, TRIG, CHOLHDL, LDLDIRECT in the last 72 hours. Thyroid function studies No results for input(s): TSH, T4TOTAL, T3FREE, THYROIDAB in the last 72 hours.  Invalid input(s): FREET3 Anemia work up No results for input(s): VITAMINB12, FOLATE, FERRITIN, TIBC, IRON, RETICCTPCT in the last 72 hours. Urinalysis    Component Value Date/Time   COLORURINE YELLOW 03/15/2020 1525   APPEARANCEUR HAZY (A) 03/15/2020 1525   LABSPEC 1.011 03/15/2020 1525   PHURINE 7.0 03/15/2020 1525   GLUCOSEU NEGATIVE 03/15/2020 1525   HGBUR NEGATIVE 03/15/2020 1525   BILIRUBINUR NEGATIVE 03/15/2020 1525   [place] 03/15/2020 1525   PROTEINUR NEGATIVE 03/15/2020 1525   NITRITE NEGATIVE 03/15/2020 1525   LEUKOCYTESUR MODERATE (A) 03/15/2020 1525   Sepsis Labs Invalid input(s): PROCALCITONIN,  WBC,  LACTICIDVEN Microbiology Recent Results (from the past 240 hour(s))  SARS Coronavirus 2 by RT PCR (hospital order, performed in [place] hospital lab) Nasopharyngeal Nasopharyngeal Swab     Status: None   Collection Time: 03/15/20  5:12 PM   Specimen: Nasopharyngeal Swab  Result Value Ref Range Status   SARS Coronavirus 2 NEGATIVE NEGATIVE Final    Comment: (NOTE) SARS-CoV-2 target nucleic acids are NOT DETECTED. The SARS-CoV-2 RNA is generally detectable in upper and lower respiratory specimens during the acute phase of infection. The  lowest concentration of SARS-CoV-2 viral copies this assay can detect is 250 copies / mL. A negative result does not preclude SARS-CoV-2 infection and should not be used as the sole basis for treatment or other patient management decisions.  A negative result may occur with improper specimen collection / handling, submission of specimen other than nasopharyngeal swab, presence of viral mutation(s) within the areas targeted by this assay, and inadequate number of viral copies (<250 copies / mL). A negative result must be combined with clinical observations, patient history, and epidemiological information. Fact Sheet for Patients:   StrictlyIdeas.no Fact Sheet for Healthcare Providers: BankingDealers.co.za This test is not yet approved or cleared  by the [place] FDA and has been authorized for detection and/or diagnosis of SARS-CoV-2 by FDA under an Emergency Use Authorization (EUA).  This EUA will remain in effect (meaning this test can be used) for the duration of the COVID-19 declaration under Section 564(b)(1) of the Act, 21 U.S.C. section 360bbb-3(b)(1), unless the authorization is terminated or revoked sooner. Performed at [facility], [address] [place], [place] [address]      Time coordinating discharge: 37mins  SIGNED:   Kathie Dike, MD  [facility] 03/17/2020, 6:11 PM   If 7PM-7AM, please contact night-coverage [email or web address]

## 2020-03-17 NOTE — Telephone Encounter (Signed)
-----   Message from Erma Heritage, [place] sent at 03/17/2020 10:20 AM EDT ----- Regarding: 30-day Monitor [place],   This patient needs a 30-day monitor for syncope per Dr. Roderic Palau (Hospitalist). Patient of Dr. Bronson Ing so would order under him.   Thanks,  [place]

## 2020-03-17 NOTE — Discharge Summary (Signed)
Physician Discharge Summary  FREDI HURTADO IWP:[id number] DOB: May 13, 1941 DOA: 03/15/2020  PCP: Sharilyn Sites, MD  Admit date: 03/15/2020 Discharge date: 03/16/2020  Admitted From: Home Disposition: Home  Recommendations for Outpatient Follow-up:  1. Follow up with PCP in 1-2 weeks 2. Please obtain BMP/CBC in one week 3. Follow-up with cardiology as previously scheduled 4. She will be referred for outpatient event monitor  Home Health: Home health RN, PT Equipment/Devices:  Discharge Condition: Stable CODE STATUS: Full code Diet recommendation: Heart healthy  Brief/Interim Summary: HPI: Claire West is a 79 y.o. female with medical history significant for hypertension, dementia, CVA, falls. Brought to the ED with reports of a witnessed syncopal episode.  Patient was sitting at the table having breakfast with her caregivers when suddenly she passed out.  Patient knew she was about to pass out and told family likewise.  Patient was out for about 1 minute at the most, and just prior to passing out, patient vomited. Patient's daughter is present at bedside and assist with the history.  Similar occurrence about 2 weeks ago, patient passed in the bathroom, lasted about 1 minute, patient also vomited during episode, family was also present at that time.  Patient's daughter reports patient at baseline has episodes of dizziness. No difficulty breathing no cough, no chest pain, no leg swelling, no vomiting no loose stools, and she has maintained good oral intake.  Discharge Diagnoses:  Active Problems:   Hyponatremia   Hypokalemia   Dehydration   Vascular dementia with behavior disturbance (HCC)   Syncope  1. Syncope.  Suspect this was vasovagal in origin.  The patient did have nausea and vomiting prior to episode.  She did not have any postictal phase.  Cardiac enzymes will be negative.  She did not have any significant findings on telemetry.  She is not orthostatic.  She did receive IV  fluids and was mildly dehydrated.  She will be referred for outpatient event monitor. 2. Hypokalemia.  This was replaced.  At this point, will discontinue hydrochlorothiazide. 3. Hyponatremia.  Likely also related to thiazide diuretic. 4. Hypertension.  Stable on metoprolol and ARB. 5. Recent CVA, history of dementia.  Patient was able to ambulate with the assistance of staff.  She has 24-hour caregivers at home.  Will order home health physical therapy and RN.  Discharge Instructions  Discharge Instructions    Diet - low sodium heart healthy   Complete by: As directed    Increase activity slowly   Complete by: As directed      Allergies as of 03/16/2020      Reactions   Penicillins Anaphylaxis, Swelling, Rash   Oral rash/peeling **Has tolerated Rocephin, Vantin since 2017**   Adhesive [tape] Itching, Rash   Irritation at site   Codeine Hives, Rash   Neosporin [neomycin-bacitracin Zn-polymyx] Rash   Sulfa Antibiotics Hives, Rash      Medication List    STOP taking these medications   hydrochlorothiazide 25 MG tablet Commonly known as: HYDRODIURIL   methylPREDNISolone 4 MG Tbpk tablet Commonly known as: MEDROL DOSEPAK     TAKE these medications   acetaminophen 500 MG tablet Commonly known as: TYLENOL Take 500 mg by mouth at bedtime. *May take one tablet daily as needed for pain*   ALPRAZolam 0.5 MG tablet Commonly known as: XANAX Take 1 tablet (0.5 mg total) by mouth at bedtime as needed for anxiety.   aspirin EC 81 MG tablet Take 81 mg by mouth every morning.  calcium carbonate 500 MG chewable tablet Commonly known as: TUMS - dosed in mg elemental calcium Chew 1 tablet by mouth daily as needed for indigestion or heartburn.   CERAVE EX Apply 1 application topically daily as needed (for itching and dryness).   clopidogrel 75 MG tablet Commonly known as: PLAVIX Take 1 tablet (75 mg total) by mouth daily. What changed: when to take this   escitalopram 20 MG  tablet Commonly known as: LEXAPRO Take 20 mg by mouth at bedtime.   FISH OIL PO Take 1 capsule by mouth every evening.   hydrocortisone cream 1 % Apply 1 application topically daily as needed for itching.   metoprolol tartrate 50 MG tablet Commonly known as: LOPRESSOR Take 25-50 mg by mouth See admin instructions. 50mg  in the morning and 25mg  in the evening   MULTIVITAMIN ADULT PO Take 1 tablet by mouth daily at 12 noon.   olmesartan 20 MG tablet Commonly known as: BENICAR Take 20 mg by mouth in the morning and at bedtime.   Omega-3 Krill Oil 500 MG Caps Take 1 capsule by mouth every evening.   omeprazole 20 MG capsule Commonly known as: PRILOSEC Take 20 mg by mouth 2 (two) times daily before a meal.   potassium chloride 10 MEQ tablet Commonly known as: KLOR-CON Take 1 tablet (10 mEq total) by mouth daily. What changed: when to take this   pravastatin 20 MG tablet Commonly known as: PRAVACHOL Take 20 mg by mouth at bedtime.   PROBIOTIC-10 PO Take 1 capsule by mouth daily. RENEW Probiotic   triamcinolone cream 0.1 % Commonly known as: KENALOG Apply 1 application topically daily as needed (for itching).   TURMERIC PO Take 1 capsule by mouth daily at 12 noon.   VITAMIN D3 PO Take 1 capsule by mouth daily at 12 noon.       Allergies  Allergen Reactions  . Penicillins Anaphylaxis, Swelling and Rash    Oral rash/peeling  **Has tolerated Rocephin, Vantin since 2017**    . Adhesive [Tape] Itching and Rash    Irritation at site  . Codeine Hives and Rash  . Neosporin [Neomycin-Bacitracin Zn-Polymyx] Rash  . Sulfa Antibiotics Hives and Rash    Consultations:     Procedures/Studies: CT HEAD WO CONTRAST  Result Date: 03/15/2020 CLINICAL DATA:  Vomiting. EXAM: CT HEAD WITHOUT CONTRAST TECHNIQUE: Contiguous axial images were obtained from the base of the skull through the vertex without intravenous contrast. COMPARISON:  December 25, 2019 FINDINGS: Brain:  There is mild cerebral atrophy with widening of the extra-axial spaces and ventricular dilatation. There are areas of decreased attenuation within the white matter tracts of the supratentorial brain, consistent with microvascular disease changes. A large area of cortical encephalomalacia, with adjacent chronic white matter low attenuation, is seen within the right occipital lobe. Vascular: No hyperdense vessel or unexpected calcification. Skull: Normal. Negative for fracture or focal lesion. Sinuses/Orbits: No acute finding. Other: None. IMPRESSION: 1. Generalized cerebral atrophy. 2. Chronic right occipital lobe infarct. 3. No acute intracranial abnormality. Electronically Signed   By: Virgina Norfolk M.D.   On: 03/15/2020 16:13      Subjective: Feeling better.  No nausea or vomiting.  No dizziness.  Discharge Exam: Vitals:   03/16/20 0000 03/16/20 0430 03/16/20 1011 03/16/20 1554  BP: (!) 146/68 (!) 151/71 (!) 152/72 (!) 150/68  Pulse: 63 (!) 58 64 64  Resp: 20 20  16   Temp: 98.2 F (36.8 C) 97.9 F (36.6 C)  97.6 F (  36.4 C)  TempSrc: Oral Oral  Oral  SpO2: 99% 99%  100%  Weight:      Height:        General: Pt is alert, awake, not in acute distress Cardiovascular: RRR, S1/S2 +, no rubs, no gallops Respiratory: CTA bilaterally, no wheezing, no rhonchi Abdominal: Soft, NT, ND, bowel sounds + Extremities: no edema, no cyanosis    The results of significant diagnostics from this hospitalization (including imaging, microbiology, ancillary and laboratory) are listed below for reference.     Microbiology: Recent Results (from the past 240 hour(s))  SARS Coronavirus 2 by RT PCR (hospital order, performed in [facility] hospital lab) Nasopharyngeal Nasopharyngeal Swab     Status: None   Collection Time: 03/15/20  5:12 PM   Specimen: Nasopharyngeal Swab  Result Value Ref Range Status   SARS Coronavirus 2 NEGATIVE NEGATIVE Final    Comment: (NOTE) SARS-CoV-2 target nucleic acids  are NOT DETECTED. The SARS-CoV-2 RNA is generally detectable in upper and lower respiratory specimens during the acute phase of infection. The lowest concentration of SARS-CoV-2 viral copies this assay can detect is 250 copies / mL. A negative result does not preclude SARS-CoV-2 infection and should not be used as the sole basis for treatment or other patient management decisions.  A negative result may occur with improper specimen collection / handling, submission of specimen other than nasopharyngeal swab, presence of viral mutation(s) within the areas targeted by this assay, and inadequate number of viral copies (<250 copies / mL). A negative result must be combined with clinical observations, patient history, and epidemiological information. Fact Sheet for Patients:   StrictlyIdeas.no Fact Sheet for Healthcare Providers: BankingDealers.co.za This test is not yet approved or cleared  by the [place] FDA and has been authorized for detection and/or diagnosis of SARS-CoV-2 by FDA under an Emergency Use Authorization (EUA).  This EUA will remain in effect (meaning this test can be used) for the duration of the COVID-19 declaration under Section 564(b)(1) of the Act, 21 U.S.C. section 360bbb-3(b)(1), unless the authorization is terminated or revoked sooner. Performed at [facility], [address] [place], [place] [address]      Labs: BNP (last 3 results) Recent Labs    12/25/19 1302  BNP 38.1   Basic Metabolic Panel: Recent Labs  Lab 03/15/20 1554 03/15/20 2145 03/16/20 0645  NA 132*  --  131*  K 2.7*  --  4.6  CL 92*  --  96*  CO2 29  --  28  GLUCOSE 109*  --  92  BUN 16  --  10  CREATININE 0.72  --  0.57  CALCIUM 8.7*  --  9.1  MG  --  2.0  --    Liver Function Tests: Recent Labs  Lab 03/15/20 1554  AST 20  ALT 19  ALKPHOS 46  BILITOT 0.5  PROT 6.4*  ALBUMIN 3.5   No results for input(s): LIPASE,  AMYLASE in the last 168 hours. No results for input(s): AMMONIA in the last 168 hours. CBC: Recent Labs  Lab 03/15/20 1554  WBC 9.4  NEUTROABS 6.4  HGB 13.5  HCT 38.8  MCV 95.3  PLT 276   Cardiac Enzymes: No results for input(s): CKTOTAL, CKMB, CKMBINDEX, TROPONINI in the last 168 hours. BNP: Invalid input(s): POCBNP CBG: No results for input(s): GLUCAP in the last 168 hours. D-Dimer No results for input(s): DDIMER in the last 72 hours. Hgb A1c No results for input(s): HGBA1C in the last  72 hours. Lipid Profile No results for input(s): CHOL, HDL, LDLCALC, TRIG, CHOLHDL, LDLDIRECT in the last 72 hours. Thyroid function studies No results for input(s): TSH, T4TOTAL, T3FREE, THYROIDAB in the last 72 hours.  Invalid input(s): FREET3 Anemia work up No results for input(s): VITAMINB12, FOLATE, FERRITIN, TIBC, IRON, RETICCTPCT in the last 72 hours. Urinalysis    Component Value Date/Time   COLORURINE YELLOW 03/15/2020 1525   APPEARANCEUR HAZY (A) 03/15/2020 1525   LABSPEC 1.011 03/15/2020 1525   PHURINE 7.0 03/15/2020 1525   GLUCOSEU NEGATIVE 03/15/2020 1525   HGBUR NEGATIVE 03/15/2020 1525   BILIRUBINUR NEGATIVE 03/15/2020 1525   [place] 03/15/2020 1525   PROTEINUR NEGATIVE 03/15/2020 1525   NITRITE NEGATIVE 03/15/2020 1525   LEUKOCYTESUR MODERATE (A) 03/15/2020 1525   Sepsis Labs Invalid input(s): PROCALCITONIN,  WBC,  LACTICIDVEN Microbiology Recent Results (from the past 240 hour(s))  SARS Coronavirus 2 by RT PCR (hospital order, performed in [place] hospital lab) Nasopharyngeal Nasopharyngeal Swab     Status: None   Collection Time: 03/15/20  5:12 PM   Specimen: Nasopharyngeal Swab  Result Value Ref Range Status   SARS Coronavirus 2 NEGATIVE NEGATIVE Final    Comment: (NOTE) SARS-CoV-2 target nucleic acids are NOT DETECTED. The SARS-CoV-2 RNA is generally detectable in upper and lower respiratory specimens during the acute phase of infection. The  lowest concentration of SARS-CoV-2 viral copies this assay can detect is 250 copies / mL. A negative result does not preclude SARS-CoV-2 infection and should not be used as the sole basis for treatment or other patient management decisions.  A negative result may occur with improper specimen collection / handling, submission of specimen other than nasopharyngeal swab, presence of viral mutation(s) within the areas targeted by this assay, and inadequate number of viral copies (<250 copies / mL). A negative result must be combined with clinical observations, patient history, and epidemiological information. Fact Sheet for Patients:   StrictlyIdeas.no Fact Sheet for Healthcare Providers: BankingDealers.co.za This test is not yet approved or cleared  by the [place] FDA and has been authorized for detection and/or diagnosis of SARS-CoV-2 by FDA under an Emergency Use Authorization (EUA).  This EUA will remain in effect (meaning this test can be used) for the duration of the COVID-19 declaration under Section 564(b)(1) of the Act, 21 U.S.C. section 360bbb-3(b)(1), unless the authorization is terminated or revoked sooner. Performed at [facility], [address], [place], [place] [address]      Time coordinating discharge: 71mins  SIGNED:   Kathie Dike, MD  [facility] 03/17/2020, 6:12 PM   If 7PM-7AM, please contact night-coverage [email or web address]

## 2020-03-18 ENCOUNTER — Telehealth (HOSPITAL_COMMUNITY): Payer: Self-pay | Admitting: *Deleted

## 2020-03-18 DIAGNOSIS — M1990 Unspecified osteoarthritis, unspecified site: Secondary | ICD-10-CM | POA: Diagnosis not present

## 2020-03-18 DIAGNOSIS — F329 Major depressive disorder, single episode, unspecified: Secondary | ICD-10-CM | POA: Diagnosis not present

## 2020-03-18 DIAGNOSIS — I69398 Other sequelae of cerebral infarction: Secondary | ICD-10-CM | POA: Diagnosis not present

## 2020-03-18 DIAGNOSIS — E876 Hypokalemia: Secondary | ICD-10-CM | POA: Diagnosis not present

## 2020-03-18 DIAGNOSIS — Z7902 Long term (current) use of antithrombotics/antiplatelets: Secondary | ICD-10-CM | POA: Diagnosis not present

## 2020-03-18 DIAGNOSIS — E860 Dehydration: Secondary | ICD-10-CM | POA: Diagnosis not present

## 2020-03-18 DIAGNOSIS — Z7982 Long term (current) use of aspirin: Secondary | ICD-10-CM | POA: Diagnosis not present

## 2020-03-18 DIAGNOSIS — F419 Anxiety disorder, unspecified: Secondary | ICD-10-CM | POA: Diagnosis not present

## 2020-03-18 DIAGNOSIS — E871 Hypo-osmolality and hyponatremia: Secondary | ICD-10-CM | POA: Diagnosis not present

## 2020-03-18 DIAGNOSIS — F0150 Vascular dementia without behavioral disturbance: Secondary | ICD-10-CM | POA: Diagnosis not present

## 2020-03-18 DIAGNOSIS — K219 Gastro-esophageal reflux disease without esophagitis: Secondary | ICD-10-CM | POA: Diagnosis not present

## 2020-03-18 DIAGNOSIS — Z9181 History of falling: Secondary | ICD-10-CM | POA: Diagnosis not present

## 2020-03-18 DIAGNOSIS — I10 Essential (primary) hypertension: Secondary | ICD-10-CM | POA: Diagnosis not present

## 2020-03-18 DIAGNOSIS — I69334 Monoplegia of upper limb following cerebral infarction affecting left non-dominant side: Secondary | ICD-10-CM | POA: Diagnosis not present

## 2020-03-18 DIAGNOSIS — I69318 Other symptoms and signs involving cognitive functions following cerebral infarction: Secondary | ICD-10-CM | POA: Diagnosis not present

## 2020-03-18 DIAGNOSIS — H547 Unspecified visual loss: Secondary | ICD-10-CM | POA: Diagnosis not present

## 2020-03-18 NOTE — Telephone Encounter (Signed)
Reaching out to patient to offer assistance regarding upcoming cardiac imaging study; pt's caregiver verbalizes understanding of appt date/time, parking situation and where to check in, pre-test NPO status and medications ordered, and verified current allergies; name and call back number provided for further questions should they arise  Burley Saver RN Navigator Cardiac [place] and Vascular [phone] office [phone] cell

## 2020-03-21 ENCOUNTER — Ambulatory Visit (HOSPITAL_COMMUNITY)
Admission: RE | Admit: 2020-03-21 | Discharge: 2020-03-21 | Disposition: A | Discharge status: Home / Self Care | Payer: Medicare Other | Source: Ambulatory Visit | Attending: Cardiovascular Disease | Admitting: Cardiovascular Disease

## 2020-03-21 ENCOUNTER — Other Ambulatory Visit: Payer: Self-pay

## 2020-03-21 DIAGNOSIS — I2510 Atherosclerotic heart disease of native coronary artery without angina pectoris: Secondary | ICD-10-CM | POA: Diagnosis not present

## 2020-03-21 DIAGNOSIS — I209 Angina pectoris, unspecified: Secondary | ICD-10-CM

## 2020-03-21 DIAGNOSIS — R0609 Other forms of dyspnea: Secondary | ICD-10-CM

## 2020-03-21 DIAGNOSIS — R072 Precordial pain: Secondary | ICD-10-CM | POA: Insufficient documentation

## 2020-03-21 DIAGNOSIS — R0600 Dyspnea, unspecified: Secondary | ICD-10-CM | POA: Diagnosis not present

## 2020-03-21 DIAGNOSIS — Z6824 Body mass index (BMI) 24.0-24.9, adult: Secondary | ICD-10-CM | POA: Diagnosis not present

## 2020-03-21 MED ORDER — IOHEXOL 350 MG/ML SOLN
80.0000 mL | Freq: Once | INTRAVENOUS | Status: AC | PRN
  Administered 2020-03-21: 80 mL via INTRAVENOUS

## 2020-03-21 MED ORDER — DILTIAZEM HCL 25 MG/5ML IV SOLN
5.0000 mg | Freq: Once | INTRAVENOUS | Status: AC
  Administered 2020-03-21: 5 mg via INTRAVENOUS
  Filled 2020-03-21: qty 5

## 2020-03-21 MED ORDER — METOPROLOL TARTRATE 5 MG/5ML IV SOLN
5.0000 mg | INTRAVENOUS | Status: DC | PRN
  Administered 2020-03-21 (×2): 5 mg via INTRAVENOUS

## 2020-03-21 MED ORDER — NITROGLYCERIN 0.4 MG SL SUBL
SUBLINGUAL_TABLET | SUBLINGUAL | Status: AC
  Administered 2020-03-21: 0.8 mg
  Filled 2020-03-21: qty 2

## 2020-03-21 MED ORDER — NITROGLYCERIN 0.4 MG SL SUBL: 0.8000 mg | SUBLINGUAL_TABLET | Freq: Once | SUBLINGUAL | Status: AC

## 2020-03-21 MED ORDER — METOPROLOL TARTRATE 5 MG/5ML IV SOLN
INTRAVENOUS | Status: AC
  Filled 2020-03-21: qty 20

## 2020-03-22 DIAGNOSIS — I69398 Other sequelae of cerebral infarction: Secondary | ICD-10-CM | POA: Diagnosis not present

## 2020-03-22 DIAGNOSIS — I69318 Other symptoms and signs involving cognitive functions following cerebral infarction: Secondary | ICD-10-CM | POA: Diagnosis not present

## 2020-03-22 DIAGNOSIS — F0150 Vascular dementia without behavioral disturbance: Secondary | ICD-10-CM | POA: Diagnosis not present

## 2020-03-22 DIAGNOSIS — R072 Precordial pain: Secondary | ICD-10-CM

## 2020-03-22 DIAGNOSIS — Z6824 Body mass index (BMI) 24.0-24.9, adult: Secondary | ICD-10-CM

## 2020-03-22 DIAGNOSIS — I69334 Monoplegia of upper limb following cerebral infarction affecting left non-dominant side: Secondary | ICD-10-CM | POA: Diagnosis not present

## 2020-03-22 DIAGNOSIS — E860 Dehydration: Secondary | ICD-10-CM | POA: Diagnosis not present

## 2020-03-22 DIAGNOSIS — I2510 Atherosclerotic heart disease of native coronary artery without angina pectoris: Secondary | ICD-10-CM

## 2020-03-22 DIAGNOSIS — H547 Unspecified visual loss: Secondary | ICD-10-CM | POA: Diagnosis not present

## 2020-03-23 ENCOUNTER — Other Ambulatory Visit: Payer: Self-pay | Admitting: *Deleted

## 2020-03-23 DIAGNOSIS — Z6825 Body mass index (BMI) 25.0-25.9, adult: Secondary | ICD-10-CM | POA: Diagnosis not present

## 2020-03-23 DIAGNOSIS — E663 Overweight: Secondary | ICD-10-CM | POA: Diagnosis not present

## 2020-03-23 DIAGNOSIS — N289 Disorder of kidney and ureter, unspecified: Secondary | ICD-10-CM | POA: Diagnosis not present

## 2020-03-23 DIAGNOSIS — E876 Hypokalemia: Secondary | ICD-10-CM | POA: Diagnosis not present

## 2020-03-23 NOTE — Patient Outreach (Signed)
[facility]) Care Management  [place]  03/23/2020   Claire West 1940/11/19 [id number]  Subjective: Telephone call to patient'sson/ designated party release(Claire West),mobile number, spoke with son,stated patient's name, date of birth, and address.Son states patient is doing pretty well, has been very lively the last couple of days, verified dates of recent hospitalization, has a follow up appointment with primary MD today, and continues to receive home health therapies.   States patient will also have a dental procedure in the near future to replace bridge and is unsure of the exact date.  States patient had a cardiac procedure on 03/21/2020 and has not received test results to date.  Son states he is assuming no news is good news.  States he is not sure when the cardiac event monitor will be placed.  Son states patient does not have any Air traffic controller, transportation, Data processing manager, or pharmacy needs at this time. States he is very appreciative of the follow up and is in agreement to continue to receive [place] Management information / services on patient's behalf.    Objective: Per KPN (Knowledge Performance Now, point of care tool) and chart review,patient hospitalized 03/15/2020 - 03/16/2020 for syncope.  Patient hospitalized 12/25/2019 - 12/28/2019 forAcute cerebrovascular accident. Patient has a history of vascular dementia, mild cognitive impairments, hypertension,herpes zoster infection, anddepression.   Encounter Medications:  Outpatient Encounter Medications as of 03/23/2020  Medication Sig  . acetaminophen (TYLENOL) 500 MG tablet Take 500 mg by mouth at bedtime. *May take one tablet daily as needed for pain*  . ALPRAZolam (XANAX) 0.5 MG tablet Take 1 tablet (0.5 mg total) by mouth at bedtime as needed for anxiety.  Marland Kitchen aspirin EC 81 MG tablet Take 81 mg by mouth every morning.   . calcium carbonate (TUMS - DOSED IN MG ELEMENTAL  CALCIUM) 500 MG chewable tablet Chew 1 tablet by mouth daily as needed for indigestion or heartburn.  . Cholecalciferol (VITAMIN D3 PO) Take 1 capsule by mouth daily at 12 noon.   . clopidogrel (PLAVIX) 75 MG tablet Take 1 tablet (75 mg total) by mouth daily. (Patient taking differently: Take 75 mg by mouth every morning. )  . Emollient (CERAVE EX) Apply 1 application topically daily as needed (for itching and dryness).  Marland Kitchen escitalopram (LEXAPRO) 20 MG tablet Take 20 mg by mouth at bedtime.   . hydrocortisone cream 1 % Apply 1 application topically daily as needed for itching.  . metoprolol tartrate (LOPRESSOR) 50 MG tablet Take 25-50 mg by mouth See admin instructions. 50mg  in the morning and 25mg  in the evening  . Multiple Vitamin (MULTIVITAMIN ADULT PO) Take 1 tablet by mouth daily at 12 noon.  Marland Kitchen olmesartan (BENICAR) 20 MG tablet Take 20 mg by mouth in the morning and at bedtime.  . Omega-3 Fatty Acids (FISH OIL PO) Take 1 capsule by mouth every evening.   . Omega-3 Krill Oil 500 MG CAPS Take 1 capsule by mouth every evening.   Marland Kitchen omeprazole (PRILOSEC) 20 MG capsule Take 20 mg by mouth 2 (two) times daily before a meal.   . potassium chloride (K-DUR) 10 MEQ tablet Take 1 tablet (10 mEq total) by mouth daily. (Patient taking differently: Take 10 mEq by mouth daily with supper. )  . pravastatin (PRAVACHOL) 20 MG tablet Take 20 mg by mouth at bedtime.   . Probiotic Product (PROBIOTIC-10 PO) Take 1 capsule by mouth daily. RENEW Probiotic  . triamcinolone cream (KENALOG) 0.1 % Apply 1  application topically daily as needed (for itching).  . TURMERIC PO Take 1 capsule by mouth daily at 12 noon.    No facility-administered encounter medications on file as of 03/23/2020.    Functional Status:  In your present state of health, do you have any difficulty performing the following activities: 03/15/2020 02/02/2020  Hearing? N -  Vision? N -  Difficulty concentrating or making decisions? Y -  Walking or  climbing stairs? Y -  Dressing or bathing? Y -  Doing errands, shopping? Y -  Conservation officer, nature and eating ? - -  Using the Toilet? - -  In the past six months, have you accidently leaked urine? - Y  Do you have problems with loss of bowel control? - N  Managing your Medications? - -  Managing your Finances? - -  Housekeeping or managing your Housekeeping? - -  Some recent data might be hidden    Fall/Depression Screening: Fall Risk  01/22/2020 12/17/2019 10/28/2018  Falls in the past year? 1 1 1   Number falls in past yr: 1 0 1  Injury with Fall? 0 1 1  Risk for fall due to : History of fall(s);Impaired balance/gait - Impaired balance/gait;Impaired mobility;History of fall(s)  Follow up Education provided;Falls prevention discussed - -   PHQ 2/9 Scores 01/22/2020  Exception Documentation Other- indicate reason in comment box  Not completed Unable to speak with patient, has dementia, and only able to speak with patient's son.    Assessment: Received Medicare [place] Management Post Acute Care Coordinator transition of care referral on 01/18/2020. Referral source: Marthenia Rolling. Referral reason: Please assign to [place] [facility]. [facility] Will have [facility]. Will have 24/7 caregivers. Son Claire West ([place]) primary contact [phone]. Agreeable to [facility] services and to [facility] palliative referral. ACC referral pending. Other Diagnosis: HTN, vascular dementia, mild cognitive impairment, HLD.Transition of care follow up week #4completed and RNCM will follow up with patient's sonfor updates within 30 business days.   THN CM Care Plan Problem One     Most Recent Value  Care Plan Problem One Risk for hospital readmission secondary to recently diagnosed with acute CVA.  Role Documenting the Problem One Care Management Telephonic Coordinator  Care Plan for Problem One Active  THN Long Term Goal  Over the next 60 days, patient will not be hospitalized for  complications related to chronic illnesses.  THN Long Term Goal Start Date 03/23/20  [Updated start date due to recent hospitalization.]  Interventions for Problem One Long Term Goal Discussed patient's admitted for syncope 03/15/2020 - 03/16/2020.  Discussed hospital follow up appointments and verified status.   THN CM Short Term Goal #1  Over the next 30 days patient will take all medications as prescribed, as evidenced by patient's son reporting during [facility] RN CM outreach  [facility] CM Short Term Goal #1 Start Date 01/22/20  Interventions for Short Term Goal #1 Discussed no medication issues or follow up issues.   THN CM Short Term Goal #2  Over the next 30 days, patient will not experience hospital readmission, as evidence by patient's son reporting and review of EMR during [place] outreach.  THN CM Short Term Goal #2 Start Date 03/23/20  [Updated start date due to recent hospitlization. ]  Interventions for Short Term Goal #2 Discussed patient's recent hospitalization.       Plan: RNCM will call patient's son for telephone outreach attempt, within 30 business days, other diagnosis complex case management  follow up.     Iviana Blasingame H. Annia Friendly, BSN, [place] Management [facility] Telephonic CM Phone: [phone] Fax: [phone]

## 2020-03-24 DIAGNOSIS — E860 Dehydration: Secondary | ICD-10-CM | POA: Diagnosis not present

## 2020-03-24 DIAGNOSIS — F0150 Vascular dementia without behavioral disturbance: Secondary | ICD-10-CM | POA: Diagnosis not present

## 2020-03-24 DIAGNOSIS — I69318 Other symptoms and signs involving cognitive functions following cerebral infarction: Secondary | ICD-10-CM | POA: Diagnosis not present

## 2020-03-24 DIAGNOSIS — H547 Unspecified visual loss: Secondary | ICD-10-CM | POA: Diagnosis not present

## 2020-03-24 DIAGNOSIS — I69398 Other sequelae of cerebral infarction: Secondary | ICD-10-CM | POA: Diagnosis not present

## 2020-03-24 DIAGNOSIS — I69334 Monoplegia of upper limb following cerebral infarction affecting left non-dominant side: Secondary | ICD-10-CM | POA: Diagnosis not present

## 2020-03-25 ENCOUNTER — Telehealth: Payer: Self-pay | Admitting: *Deleted

## 2020-03-25 NOTE — Telephone Encounter (Signed)
Laurine Blazer, LPN  6/71/2458 0:99 AM EDT Back to Top    Valetta Fuller (care giver) notified, copy to pcp.

## 2020-03-25 NOTE — Telephone Encounter (Signed)
Laurine Blazer, LPN  6/92/2300 9:79 AM EDT Back to Top    Left message to return call.

## 2020-03-25 NOTE — Telephone Encounter (Signed)
-----   Message from Arnoldo Lenis, MD sent at 03/23/2020 10:07 AM EDT ----- Heart CT overall looks good. Some mild to moderate blockages but none are significant enough to effect blood flow, overall reassuring test. Keep regular f/u with Dr Ronelle Nigh BrancH MD

## 2020-03-28 ENCOUNTER — Telehealth: Payer: Self-pay | Admitting: Cardiovascular Disease

## 2020-03-28 NOTE — Telephone Encounter (Signed)
Informed caregiver - 30 days.  She will place monitor on for patient.

## 2020-03-28 NOTE — Telephone Encounter (Signed)
Patient has questions about her heart monitor.  How many days is she required to wear?  Please call [phone]

## 2020-03-29 ENCOUNTER — Ambulatory Visit (INDEPENDENT_AMBULATORY_CARE_PROVIDER_SITE_OTHER): Payer: Medicare Other

## 2020-03-29 DIAGNOSIS — R55 Syncope and collapse: Secondary | ICD-10-CM

## 2020-03-29 DIAGNOSIS — E860 Dehydration: Secondary | ICD-10-CM | POA: Diagnosis not present

## 2020-03-29 DIAGNOSIS — I69318 Other symptoms and signs involving cognitive functions following cerebral infarction: Secondary | ICD-10-CM | POA: Diagnosis not present

## 2020-03-29 DIAGNOSIS — I69398 Other sequelae of cerebral infarction: Secondary | ICD-10-CM | POA: Diagnosis not present

## 2020-03-29 DIAGNOSIS — H547 Unspecified visual loss: Secondary | ICD-10-CM | POA: Diagnosis not present

## 2020-03-29 DIAGNOSIS — I69334 Monoplegia of upper limb following cerebral infarction affecting left non-dominant side: Secondary | ICD-10-CM | POA: Diagnosis not present

## 2020-03-29 DIAGNOSIS — F0150 Vascular dementia without behavioral disturbance: Secondary | ICD-10-CM | POA: Diagnosis not present

## 2020-03-30 DIAGNOSIS — F0150 Vascular dementia without behavioral disturbance: Secondary | ICD-10-CM | POA: Diagnosis not present

## 2020-03-30 DIAGNOSIS — I69334 Monoplegia of upper limb following cerebral infarction affecting left non-dominant side: Secondary | ICD-10-CM | POA: Diagnosis not present

## 2020-03-30 DIAGNOSIS — E860 Dehydration: Secondary | ICD-10-CM | POA: Diagnosis not present

## 2020-03-30 DIAGNOSIS — I69318 Other symptoms and signs involving cognitive functions following cerebral infarction: Secondary | ICD-10-CM | POA: Diagnosis not present

## 2020-03-30 DIAGNOSIS — H547 Unspecified visual loss: Secondary | ICD-10-CM | POA: Diagnosis not present

## 2020-03-30 DIAGNOSIS — I69398 Other sequelae of cerebral infarction: Secondary | ICD-10-CM | POA: Diagnosis not present

## 2020-04-04 DIAGNOSIS — I69398 Other sequelae of cerebral infarction: Secondary | ICD-10-CM | POA: Diagnosis not present

## 2020-04-04 DIAGNOSIS — I69334 Monoplegia of upper limb following cerebral infarction affecting left non-dominant side: Secondary | ICD-10-CM | POA: Diagnosis not present

## 2020-04-04 DIAGNOSIS — F0150 Vascular dementia without behavioral disturbance: Secondary | ICD-10-CM | POA: Diagnosis not present

## 2020-04-04 DIAGNOSIS — I69318 Other symptoms and signs involving cognitive functions following cerebral infarction: Secondary | ICD-10-CM | POA: Diagnosis not present

## 2020-04-04 DIAGNOSIS — H547 Unspecified visual loss: Secondary | ICD-10-CM | POA: Diagnosis not present

## 2020-04-04 DIAGNOSIS — E860 Dehydration: Secondary | ICD-10-CM | POA: Diagnosis not present

## 2020-04-12 ENCOUNTER — Other Ambulatory Visit: Payer: Medicare Other

## 2020-04-12 ENCOUNTER — Other Ambulatory Visit: Payer: Self-pay

## 2020-04-12 DIAGNOSIS — Z515 Encounter for palliative care: Secondary | ICD-10-CM

## 2020-04-13 ENCOUNTER — Emergency Department (HOSPITAL_COMMUNITY)
Admission: EM | Admit: 2020-04-13 | Discharge: 2020-04-13 | Disposition: A | Discharge status: Home / Self Care | Payer: Medicare Other | Attending: Emergency Medicine | Admitting: Emergency Medicine

## 2020-04-13 ENCOUNTER — Other Ambulatory Visit: Payer: Self-pay

## 2020-04-13 ENCOUNTER — Encounter (HOSPITAL_COMMUNITY): Payer: Self-pay | Admitting: Emergency Medicine

## 2020-04-13 ENCOUNTER — Emergency Department (HOSPITAL_COMMUNITY): Payer: Medicare Other

## 2020-04-13 DIAGNOSIS — I10 Essential (primary) hypertension: Secondary | ICD-10-CM | POA: Insufficient documentation

## 2020-04-13 DIAGNOSIS — R404 Transient alteration of awareness: Secondary | ICD-10-CM | POA: Diagnosis not present

## 2020-04-13 DIAGNOSIS — F0390 Unspecified dementia without behavioral disturbance: Secondary | ICD-10-CM | POA: Insufficient documentation

## 2020-04-13 DIAGNOSIS — Z79899 Other long term (current) drug therapy: Secondary | ICD-10-CM | POA: Insufficient documentation

## 2020-04-13 DIAGNOSIS — I6782 Cerebral ischemia: Secondary | ICD-10-CM | POA: Diagnosis not present

## 2020-04-13 DIAGNOSIS — R519 Headache, unspecified: Secondary | ICD-10-CM | POA: Diagnosis not present

## 2020-04-13 DIAGNOSIS — R0689 Other abnormalities of breathing: Secondary | ICD-10-CM | POA: Diagnosis not present

## 2020-04-13 DIAGNOSIS — Z7982 Long term (current) use of aspirin: Secondary | ICD-10-CM | POA: Diagnosis not present

## 2020-04-13 DIAGNOSIS — G319 Degenerative disease of nervous system, unspecified: Secondary | ICD-10-CM | POA: Diagnosis not present

## 2020-04-13 DIAGNOSIS — R05 Cough: Secondary | ICD-10-CM | POA: Diagnosis not present

## 2020-04-13 DIAGNOSIS — R059 Cough, unspecified: Secondary | ICD-10-CM

## 2020-04-13 DIAGNOSIS — I7090 Unspecified atherosclerosis: Secondary | ICD-10-CM | POA: Diagnosis not present

## 2020-04-13 DIAGNOSIS — Z743 Need for continuous supervision: Secondary | ICD-10-CM | POA: Diagnosis not present

## 2020-04-13 DIAGNOSIS — R6889 Other general symptoms and signs: Secondary | ICD-10-CM | POA: Diagnosis not present

## 2020-04-13 HISTORY — DX: Transient cerebral ischemic attack, unspecified: G45.9

## 2020-04-13 HISTORY — DX: Cerebral infarction, unspecified: I63.9

## 2020-04-13 MED ORDER — GUAIFENESIN-DM 100-10 MG/5ML PO SYRP: 2.5000 mL | ORAL_SOLUTION | Freq: Four times a day (QID) | ORAL | 0 refills | Status: DC | PRN

## 2020-04-13 NOTE — ED Provider Notes (Signed)
[facility] Provider Note   CSN: [id number] Arrival date & time: 04/13/20  1303     History Chief Complaint  Patient presents with  . Hypertension    Claire West is a 79 y.o. female.  HPI Level 5 caveat due to difficulty hearing and mild cognitive impairment. Patient was sent in reportedly for coughing hypertension headache.  Has had cough for the last couple weeks along with a headache.  Patient states the the cough really has not had production.  Had previously been on steroids.  No fevers.  Blood pressure elevated at home up to 190/110 but is improved.  Patient's caregiver states they called PCP and they were worried about the headache and the cough together.    Past Medical History:  Diagnosis Date  . Arthritis   . Depression   . Gait instability    walks with cane  . GERD (gastroesophageal reflux disease)   . History of blood transfusion   . Hyperlipemia   . Hypertension   . Knee pain   . Metabolic encephalopathy   . Mild cognitive impairment with memory loss   . Seasonal allergies   . Skin cancer   . Stroke ([place])   . TIA (transient ischemic attack)     Patient Active Problem List   Diagnosis Date Noted  . Syncope 03/15/2020  . History of ischemic right PCA stroke 02/18/2020  . Acute CVA (cerebrovascular accident) ([place]) 12/25/2019  . Restlessness and agitation 12/17/2019  . Vascular dementia with behavior disturbance ([place]) 12/17/2019  . Mild cognitive impairment 06/25/2019  . Depression 06/25/2019  . Falls frequently 10/28/2018  . TIA (transient ischemic attack) 08/23/2018  . Delirium in remission 07/07/2018  . OA (osteoarthritis) of knee 05/05/2018  . Amnestic MCI (mild cognitive impairment with memory loss) 12/24/2017  . Acute metabolic encephalopathy 70/62/3762  . Altered mental status 11/06/2017  . Hyponatremia 02/12/2016  . Hypokalemia 02/12/2016  . Dehydration 02/12/2016  . Elevated LFTs 02/12/2016  . HTN (hypertension)  02/12/2016  . GERD (gastroesophageal reflux disease) 02/12/2016    Past Surgical History:  Procedure Laterality Date  . ABDOMINAL HYSTERECTOMY    . ARTHROSCOPIC REPAIR ACL Left   . COLONOSCOPY    . COLONOSCOPY N/A 11/23/2015   Procedure: COLONOSCOPY;  Surgeon: Rogene Houston, MD;  Location: [facility];  Service: Endoscopy;  Laterality: N/A;  830  . EYE SURGERY Bilateral    Cataracts  . SKIN CANCER EXCISION     Right shoulder  . TOTAL KNEE ARTHROPLASTY Left 05/05/2018   Procedure: LEFT TOTAL KNEE ARTHROPLASTY;  Surgeon: Gaynelle Arabian, MD;  Location: [facility];  Service: Orthopedics;  Laterality: Left;  with block  . TOTAL KNEE ARTHROPLASTY Right 06/22/2019   Procedure: TOTAL KNEE ARTHROPLASTY;  Surgeon: Gaynelle Arabian, MD;  Location: [facility];  Service: Orthopedics;  Laterality: Right;  95min     OB History    Gravida  1   Para  1   Term  1   Preterm      AB      Living  1     SAB      TAB      Ectopic      Multiple      Live Births              Family History  Problem Relation Age of Onset  . Lung cancer Mother   . Lung cancer Sister     Social History  Tobacco Use  . Smoking status: Never Smoker  . Smokeless tobacco: Never Used  Vaping Use  . Vaping Use: Never used  Substance Use Topics  . Alcohol use: Not Currently    Comment: occ  . Drug use: No    Home Medications Prior to Admission medications   Medication Sig Start Date End Date Taking? Authorizing Provider  acetaminophen (TYLENOL) 500 MG tablet Take 500 mg by mouth at bedtime. *May take one tablet daily as needed for pain*    [provider]  ALPRAZolam Duanne Moron) 0.5 MG tablet Take 1 tablet (0.5 mg total) by mouth at bedtime as needed for anxiety. 12/28/19   Swayze, Ava, DO  aspirin EC 81 MG tablet Take 81 mg by mouth every morning.     [provider]  calcium carbonate (TUMS - DOSED IN MG ELEMENTAL CALCIUM) 500 MG chewable tablet Chew 1 tablet by mouth daily as needed  for indigestion or heartburn.    [provider]  Cholecalciferol (VITAMIN D3 PO) Take 1 capsule by mouth daily at 12 noon.     [provider]  clopidogrel (PLAVIX) 75 MG tablet Take 1 tablet (75 mg total) by mouth daily. Patient taking differently: Take 75 mg by mouth every morning.  12/29/19   Swayze, Ava, DO  Emollient (CERAVE EX) Apply 1 application topically daily as needed (for itching and dryness).    [provider]  escitalopram (LEXAPRO) 20 MG tablet Take 20 mg by mouth at bedtime.  03/07/18   [provider]  guaiFENesin-dextromethorphan (ROBITUSSIN DM) 100-10 MG/5ML syrup Take 2.5-5 mLs by mouth every 6 (six) hours as needed for cough. 04/13/20   Davonna Belling, MD  hydrocortisone cream 1 % Apply 1 application topically daily as needed for itching.    [provider]  metoprolol tartrate (LOPRESSOR) 50 MG tablet Take 25-50 mg by mouth See admin instructions. 50mg  in the morning and 25mg  in the evening 11/21/19   [provider]  Multiple Vitamin (MULTIVITAMIN ADULT PO) Take 1 tablet by mouth daily at 12 noon.    [provider]  olmesartan (BENICAR) 20 MG tablet Take 20 mg by mouth in the morning and at bedtime.    [provider]  Omega-3 Fatty Acids (FISH OIL PO) Take 1 capsule by mouth every evening.     [provider]  Omega-3 Krill Oil 500 MG CAPS Take 1 capsule by mouth every evening.     [provider]  omeprazole (PRILOSEC) 20 MG capsule Take 20 mg by mouth 2 (two) times daily before a meal.  04/18/15   [provider]  potassium chloride (K-DUR) 10 MEQ tablet Take 1 tablet (10 mEq total) by mouth daily. Patient taking differently: Take 10 mEq by mouth daily with supper.  11/04/17   Noemi Chapel, MD  pravastatin (PRAVACHOL) 20 MG tablet Take 20 mg by mouth at bedtime.     [provider]  Probiotic Product (PROBIOTIC-10 PO) Take 1 capsule by mouth daily. RENEW Probiotic     [provider]  triamcinolone cream (KENALOG) 0.1 % Apply 1 application topically daily as needed (for itching).    [provider]  TURMERIC PO Take 1 capsule by mouth daily at 12 noon.     [provider]    Allergies    Penicillins, Adhesive [tape], Codeine, Neosporin [neomycin-bacitracin zn-polymyx], and Sulfa antibiotics  Review of Systems   Review of Systems  Unable to perform ROS: Dementia  Physical Exam Updated Vital Signs BP (!) 177/75   Pulse 68   Temp 98.3 F (36.8 C) (Oral)   Resp 16   Ht 5\' 6"  (1.676 m)   Wt 63.5 kg   SpO2 96%   BMI 22.60 kg/m   Physical Exam Vitals and nursing note reviewed.  HENT:     Head: Atraumatic.  Cardiovascular:     Rate and Rhythm: Normal rate and regular rhythm.  Pulmonary:     Breath sounds: No wheezing, rhonchi or rales.  Abdominal:     Tenderness: There is no abdominal tenderness.  Musculoskeletal:     Right lower leg: No edema.     Left lower leg: No edema.  Skin:    General: Skin is warm.     Capillary Refill: Capillary refill takes less than 2 seconds.  Neurological:     Mental Status: She is alert.     Comments: Awake and pleasant but some confusion and some difficulty hearing.     ED Results / Procedures / Treatments   Labs (all labs ordered are listed, but only abnormal results are displayed) Labs Reviewed - No data to display  EKG None  Radiology CT Head Wo Contrast  Result Date: 04/13/2020 CLINICAL DATA:  Headache, acute, normal neuro exam. Additional provided: Hypertensive episode at home today, history of stroke 6 months ago EXAM: CT HEAD WITHOUT CONTRAST TECHNIQUE: Contiguous axial images were obtained from the base of the skull through the vertex without intravenous contrast. COMPARISON:  Head CT 03/15/2020, MRI/MRA head 12/24/2020 FINDINGS: Brain: Stable, mild generalized parenchymal atrophy. Redemonstrated chronic right PCA territory cortical/subcortical infarct. Stable  moderate patchy hypoattenuation within the cerebral white matter which is nonspecific, but consistent with chronic small vessel ischemic disease. There is no acute intracranial hemorrhage. No acute demarcated cortical infarct is identified. No extra-axial fluid collection. No evidence of intracranial mass. No midline shift. Vascular: No hyperdense vessel.  Atherosclerotic calcifications. Skull: Normal. Negative for fracture or focal lesion. Sinuses/Orbits: Visualized orbits show no acute finding. No significant paranasal sinus disease or mastoid effusion at the imaged levels. IMPRESSION: No CT evidence of acute intracranial abnormality. Redemonstrated chronic right PCA territory infarct. Stable mild generalized parenchymal atrophy and moderate chronic small vessel ischemic disease. Electronically Signed   By: Kellie Simmering DO   On: 04/13/2020 15:11   DG Chest Portable 1 View  Result Date: 04/13/2020 CLINICAL DATA:  79 year old presenting with acute onset of cough and generalized weakness. EXAM: PORTABLE CHEST 1 VIEW COMPARISON:  12/25/2019 and earlier, including CT heart 03/21/2020 and CTA chest 07/01/2019. FINDINGS: Cardiac silhouette upper normal in size for [facility] portable technique, unchanged. Lungs clear. Bronchovascular markings normal. Pulmonary vascularity normal. No visible pleural effusions. No pneumothorax. BILATERAL breast implants with dystrophic calcifications. Cardiac monitoring device projects over the central chest. IMPRESSION: No acute cardiopulmonary disease. Electronically Signed   By: Evangeline Dakin M.D.   On: 04/13/2020 14:26    Procedures Procedures (including critical care time)  Medications Ordered in ED Medications - No data to display  ED Course  I have reviewed the triage vital signs and the nursing notes.  Pertinent labs & imaging results that were available during my care of the patient were reviewed by me and considered in my medical decision making (see chart for  details).    MDM Rules/Calculators/A&P                          Patient with URI symptoms.  X-ray showed no pneumonia.  Not hypoxic.  Also headache with cough.  Head CT done and reassuring.  Image due to some anticoagulation.  Will discharge home with symptomatic treatment. Final Clinical Impression(s) / ED Diagnoses Final diagnoses:  Cough  Acute nonintractable headache, unspecified headache type  Hypertension, unspecified type    Rx / DC Orders ED Discharge Orders         Ordered    guaiFENesin-dextromethorphan (ROBITUSSIN DM) 100-10 MG/5ML syrup  Every 6 hours PRN     Discontinue  Reprint     04/13/20 1521           Davonna Belling, MD 04/13/20 1639

## 2020-04-13 NOTE — ED Triage Notes (Addendum)
Per EMS, pt's home RN Jackelyn Poling) reported hypertensive episode at home (190/110). Ems reported 170/92. Hx of CVA 6 months ago. Pt with difficulty ambulation, but that is baseline from last stroke. Pt c/o HA x 2 weeks. Pt also c/o cough with clear phlegm. Pt with intermittent mild confusion.

## 2020-04-14 NOTE — Progress Notes (Signed)
COMMUNITY PALLIATIVE CARE SW NOTE  PATIENT NAME: Claire West DOB: [birth year] MRN: [id number]  PRIMARY CARE PROVIDER: Sharilyn Sites, MD  RESPONSIBLE PARTY:  Acct ID - Guarantor Home Phone Work Phone Relationship Acct Type  [id number] - [name]  Self P/F     [address], [place], [place] [address]     PLAN OF CARE and INTERVENTIONS:             1. GOALS OF CARE/ ADVANCE CARE PLANNING:  Goal is for patient to remain in her home with 24 hour care support. Patient is a FULL CODE. 2. SOCIAL/EMOTIONAL/SPIRITUAL ASSESSMENT/ INTERVENTIONS:  SW completed home visit with patient. She was napping in her recliner when SW arrived, but easily aroused. Patient smiled, she denied pain. Her sitter-[name] was present with her. The sitter informed SW that patient had an emergency room visit on 03/15/20. Patient was taken to emergency department following an episode of weakness, vomiting and she passed out. ;She was seen and released within 24 hours and she discharged with a heart monitor that she will wear for a month. The heart monitor will be removed on the 04/26/20. An appointment will be made to patient's primary care physician as she is still coughing up clear flym. Patient is having increased headaches-Tylenol given. Patient was discharged from physical therapy. Patient has several upcoming appointments with neurology (9/2) and heart doctor (8/19). No other concerns. SW provided observation, assessment of needs and comfort, active listening, social stimulation and consultation with her sitters.  3. PATIENT/CAREGIVER EDUCATION/ COPING:  Patient seems to be coping well and verbalized no mental or emotional distress. She appeared to be in a cheerful mood. Patient has a supportive son and sister. She has a good rapport with her sitters and they serve as a source of support to her.  4. PERSONAL EMERGENCY PLAN:  911 can be activated for emergencies.  5. COMMUNITY RESOURCES COORDINATION/ HEALTH CARE  NAVIGATION:  No financial or legal concerns noted.  6. FINANCIAL/LEGAL CONCERNS/INTERVENTIONS:  No financial or legal concerns noted.      SOCIAL HX:  Social History   Tobacco Use  . Smoking status: Never Smoker  . Smokeless tobacco: Never Used  Substance Use Topics  . Alcohol use: Not Currently    Comment: occ    CODE STATUS: FULL CODE ADVANCED DIRECTIVES: No MOST FORM COMPLETE:  No HOSPICE EDUCATION PROVIDED: No  PPS: Patient is alert and oriented x3, with some forgetfulness. She ambulates with her walker, but her gait remains unsteady. Patient has 24 hour sitters that provide assistance with personal care, house chores and support.  Duration of visit and documentation: 60 minutes       Katheren Puller, LCSW

## 2020-04-17 DIAGNOSIS — M1990 Unspecified osteoarthritis, unspecified site: Secondary | ICD-10-CM | POA: Diagnosis not present

## 2020-04-17 DIAGNOSIS — F0150 Vascular dementia without behavioral disturbance: Secondary | ICD-10-CM | POA: Diagnosis not present

## 2020-04-17 DIAGNOSIS — E876 Hypokalemia: Secondary | ICD-10-CM | POA: Diagnosis not present

## 2020-04-17 DIAGNOSIS — E871 Hypo-osmolality and hyponatremia: Secondary | ICD-10-CM | POA: Diagnosis not present

## 2020-04-17 DIAGNOSIS — I69318 Other symptoms and signs involving cognitive functions following cerebral infarction: Secondary | ICD-10-CM | POA: Diagnosis not present

## 2020-04-17 DIAGNOSIS — E860 Dehydration: Secondary | ICD-10-CM | POA: Diagnosis not present

## 2020-04-17 DIAGNOSIS — I69398 Other sequelae of cerebral infarction: Secondary | ICD-10-CM | POA: Diagnosis not present

## 2020-04-17 DIAGNOSIS — Z9181 History of falling: Secondary | ICD-10-CM | POA: Diagnosis not present

## 2020-04-17 DIAGNOSIS — H547 Unspecified visual loss: Secondary | ICD-10-CM | POA: Diagnosis not present

## 2020-04-17 DIAGNOSIS — F419 Anxiety disorder, unspecified: Secondary | ICD-10-CM | POA: Diagnosis not present

## 2020-04-17 DIAGNOSIS — Z7902 Long term (current) use of antithrombotics/antiplatelets: Secondary | ICD-10-CM | POA: Diagnosis not present

## 2020-04-17 DIAGNOSIS — I69334 Monoplegia of upper limb following cerebral infarction affecting left non-dominant side: Secondary | ICD-10-CM | POA: Diagnosis not present

## 2020-04-17 DIAGNOSIS — I10 Essential (primary) hypertension: Secondary | ICD-10-CM | POA: Diagnosis not present

## 2020-04-17 DIAGNOSIS — F329 Major depressive disorder, single episode, unspecified: Secondary | ICD-10-CM | POA: Diagnosis not present

## 2020-04-17 DIAGNOSIS — K219 Gastro-esophageal reflux disease without esophagitis: Secondary | ICD-10-CM | POA: Diagnosis not present

## 2020-04-17 DIAGNOSIS — Z7982 Long term (current) use of aspirin: Secondary | ICD-10-CM | POA: Diagnosis not present

## 2020-04-20 ENCOUNTER — Ambulatory Visit: Payer: Self-pay | Admitting: *Deleted

## 2020-04-20 DIAGNOSIS — E860 Dehydration: Secondary | ICD-10-CM | POA: Diagnosis not present

## 2020-04-20 DIAGNOSIS — H547 Unspecified visual loss: Secondary | ICD-10-CM | POA: Diagnosis not present

## 2020-04-20 DIAGNOSIS — I69318 Other symptoms and signs involving cognitive functions following cerebral infarction: Secondary | ICD-10-CM | POA: Diagnosis not present

## 2020-04-20 DIAGNOSIS — F0150 Vascular dementia without behavioral disturbance: Secondary | ICD-10-CM | POA: Diagnosis not present

## 2020-04-20 DIAGNOSIS — I69398 Other sequelae of cerebral infarction: Secondary | ICD-10-CM | POA: Diagnosis not present

## 2020-04-20 DIAGNOSIS — I69334 Monoplegia of upper limb following cerebral infarction affecting left non-dominant side: Secondary | ICD-10-CM | POA: Diagnosis not present

## 2020-04-26 ENCOUNTER — Ambulatory Visit: Payer: Self-pay | Admitting: *Deleted

## 2020-04-27 DIAGNOSIS — L570 Actinic keratosis: Secondary | ICD-10-CM | POA: Diagnosis not present

## 2020-04-27 DIAGNOSIS — I69318 Other symptoms and signs involving cognitive functions following cerebral infarction: Secondary | ICD-10-CM | POA: Diagnosis not present

## 2020-04-27 DIAGNOSIS — X32XXXD Exposure to sunlight, subsequent encounter: Secondary | ICD-10-CM | POA: Diagnosis not present

## 2020-04-27 DIAGNOSIS — H547 Unspecified visual loss: Secondary | ICD-10-CM | POA: Diagnosis not present

## 2020-04-27 DIAGNOSIS — F0150 Vascular dementia without behavioral disturbance: Secondary | ICD-10-CM | POA: Diagnosis not present

## 2020-04-27 DIAGNOSIS — E860 Dehydration: Secondary | ICD-10-CM | POA: Diagnosis not present

## 2020-04-27 DIAGNOSIS — I69334 Monoplegia of upper limb following cerebral infarction affecting left non-dominant side: Secondary | ICD-10-CM | POA: Diagnosis not present

## 2020-04-27 DIAGNOSIS — I69398 Other sequelae of cerebral infarction: Secondary | ICD-10-CM | POA: Diagnosis not present

## 2020-05-02 ENCOUNTER — Ambulatory Visit: Payer: Self-pay | Admitting: *Deleted

## 2020-05-05 DIAGNOSIS — F0150 Vascular dementia without behavioral disturbance: Secondary | ICD-10-CM | POA: Diagnosis not present

## 2020-05-05 DIAGNOSIS — H547 Unspecified visual loss: Secondary | ICD-10-CM | POA: Diagnosis not present

## 2020-05-05 DIAGNOSIS — I69334 Monoplegia of upper limb following cerebral infarction affecting left non-dominant side: Secondary | ICD-10-CM | POA: Diagnosis not present

## 2020-05-05 DIAGNOSIS — I69398 Other sequelae of cerebral infarction: Secondary | ICD-10-CM | POA: Diagnosis not present

## 2020-05-05 DIAGNOSIS — I69318 Other symptoms and signs involving cognitive functions following cerebral infarction: Secondary | ICD-10-CM | POA: Diagnosis not present

## 2020-05-05 DIAGNOSIS — E860 Dehydration: Secondary | ICD-10-CM | POA: Diagnosis not present

## 2020-05-09 ENCOUNTER — Ambulatory Visit: Payer: Self-pay | Admitting: *Deleted

## 2020-05-13 DIAGNOSIS — F0150 Vascular dementia without behavioral disturbance: Secondary | ICD-10-CM | POA: Diagnosis not present

## 2020-05-13 DIAGNOSIS — E860 Dehydration: Secondary | ICD-10-CM | POA: Diagnosis not present

## 2020-05-13 DIAGNOSIS — I69334 Monoplegia of upper limb following cerebral infarction affecting left non-dominant side: Secondary | ICD-10-CM | POA: Diagnosis not present

## 2020-05-13 DIAGNOSIS — I69398 Other sequelae of cerebral infarction: Secondary | ICD-10-CM | POA: Diagnosis not present

## 2020-05-13 DIAGNOSIS — I69318 Other symptoms and signs involving cognitive functions following cerebral infarction: Secondary | ICD-10-CM | POA: Diagnosis not present

## 2020-05-13 DIAGNOSIS — H547 Unspecified visual loss: Secondary | ICD-10-CM | POA: Diagnosis not present

## 2020-05-16 ENCOUNTER — Other Ambulatory Visit (HOSPITAL_COMMUNITY): Payer: Self-pay | Admitting: Family Medicine

## 2020-05-16 DIAGNOSIS — E860 Dehydration: Secondary | ICD-10-CM | POA: Diagnosis not present

## 2020-05-16 DIAGNOSIS — I69318 Other symptoms and signs involving cognitive functions following cerebral infarction: Secondary | ICD-10-CM | POA: Diagnosis not present

## 2020-05-16 DIAGNOSIS — I69398 Other sequelae of cerebral infarction: Secondary | ICD-10-CM | POA: Diagnosis not present

## 2020-05-16 DIAGNOSIS — F0150 Vascular dementia without behavioral disturbance: Secondary | ICD-10-CM | POA: Diagnosis not present

## 2020-05-16 DIAGNOSIS — I69334 Monoplegia of upper limb following cerebral infarction affecting left non-dominant side: Secondary | ICD-10-CM | POA: Diagnosis not present

## 2020-05-16 DIAGNOSIS — Z1231 Encounter for screening mammogram for malignant neoplasm of breast: Secondary | ICD-10-CM

## 2020-05-16 DIAGNOSIS — H547 Unspecified visual loss: Secondary | ICD-10-CM | POA: Diagnosis not present

## 2020-05-17 ENCOUNTER — Ambulatory Visit: Payer: Self-pay | Admitting: *Deleted

## 2020-05-25 NOTE — Progress Notes (Signed)
Cardiology Office Note  Date: 05/26/2020   ID: Claire West, DOB [birth year], MRN [id number]  PCP:  Sharilyn Sites, MD  Cardiologist:  No primary care provider on file. Electrophysiologist:  None   Chief Complaint: DOE  History of Present Illness: Claire West is a 79 y.o. female with a history of DOE, CVA, HTN, HLD  Last office visit 02/23/2020 with Dr. Bronson Ing for dyspnea on exertion.  Seen at the request of Dr. Hilma Favors her primary care provider.  Claire West had been evaluated by neurology on 02/18/2020 due to recently sustained a stroke in the right PCA territory in March 2021.  Unremarkable echocardiogram reviewed in detail below.  CT angiogram of the neck 12/26/2019 showed bilateral cervical internal carotid artery fibromuscular dysplasia.  Had been experiencing progressive shortness of breath with minimal exertion.  This could occur when walking from bedroom to bathroom.  Claire West ambulated with a walker.  Claire West denied exertional chest pain.  Her caregiver mentions around [place] the previous year Claire West had a weak spell and was unable to obtain a blood pressure or heart rate.  EMS was called and patient was found to be in atrial fibrillation.  Dr. Bronson Ing reviewed several EKGs and none of them demonstrated atrial fibrillation.  He ordered a CT angiography and event monitor.  Results are below.  Claire West continued on aspirin, Plavix and statin for recent CVA.  Hypertension was controlled on present therapy.   Patient presented to the emergency room on 03/15/2020 status post witnessed syncopal episode.  Claire West was sitting at the  table having breakfast when Claire West suddenly passed out.  Claire West stated Claire West knew Claire West was about to pass out and told family.  Patient was out for about 1 minute at most.  Just prior to passing out Claire West vomited.  Claire West had a similar episode 2 weeks prior in the bathroom after vomiting.  Patient's daughter reports patient at baseline had episodes of dizziness.  Syncope was suspected to be  vasovagal in origin.  Claire West had recently had a CVA and has history of dementia.  Claire West was hypokalemic and hyponatremic and hydrochlorothiazide was discontinued.  Her blood pressure was stable on metoprolol and ARB.  Claire West presented to the emergency department on 04/13/2020 with complaint of coughing, hypertension, and headache.  Chest x-ray showed no pneumonia.  Claire West was not hypoxic.  Headache occurring with cough.  Head CT done and reassuring.Marland Kitchen  Claire West was discharged home with some symptomatic treatment  Today Claire West presents with no particular complaints.  Daughter who is with her states Claire West is having some issues with balance and gait Claire West has some left-sided residual weight will weakness from her stroke.  Claire West is using a walker to ambulate.  Daughter states her peripheral vision and central vision in her right eye has been affected somewhat.  Claire West also states there are some cognitive issues since the stroke.  Patient's daughter who is the primary historian today states her blood pressure is running somewhere in the mid 130s to low 140s.  Daughter states Claire West is attempting to adjust the olmesartan and metoprolol based on blood pressure.  Patient admits to some fatigue since the stroke.  Claire West denies any anginal or exertional symptoms, palpitations or arrhythmias, orthostatic symptoms.  Claire West has had no further vasovagal episodes as mentioned above.  Past Medical History:  Diagnosis Date  . Arthritis   . Depression   . Gait instability    walks with cane  . GERD (gastroesophageal reflux disease)   .  History of blood transfusion   . Hyperlipemia   . Hypertension   . Knee pain   . Metabolic encephalopathy   . Mild cognitive impairment with memory loss   . Seasonal allergies   . Skin cancer   . Stroke ([place])   . TIA (transient ischemic attack)     Past Surgical History:  Procedure Laterality Date  . ABDOMINAL HYSTERECTOMY    . ARTHROSCOPIC REPAIR ACL Left   . COLONOSCOPY    . COLONOSCOPY N/A 11/23/2015    Procedure: COLONOSCOPY;  Surgeon: Rogene Houston, MD;  Location: [facility];  Service: Endoscopy;  Laterality: N/A;  830  . EYE SURGERY Bilateral    Cataracts  . SKIN CANCER EXCISION     Right shoulder  . TOTAL KNEE ARTHROPLASTY Left 05/05/2018   Procedure: LEFT TOTAL KNEE ARTHROPLASTY;  Surgeon: Gaynelle Arabian, MD;  Location: [facility];  Service: Orthopedics;  Laterality: Left;  with block  . TOTAL KNEE ARTHROPLASTY Right 06/22/2019   Procedure: TOTAL KNEE ARTHROPLASTY;  Surgeon: Gaynelle Arabian, MD;  Location: [facility];  Service: Orthopedics;  Laterality: Right;  66min    Current Outpatient Medications  Medication Sig Dispense Refill  . acetaminophen (TYLENOL) 500 MG tablet Take 500 mg by mouth at bedtime. *May take one tablet daily as needed for pain*    . ALPRAZolam (XANAX) 0.5 MG tablet Take 1 tablet (0.5 mg total) by mouth at bedtime as needed for anxiety. 30 tablet 0  . aspirin EC 81 MG tablet Take 81 mg by mouth every morning.     Marland Kitchen buPROPion (WELLBUTRIN XL) 150 MG 24 hr tablet Take 75 mg by mouth daily.    . calcium carbonate (TUMS - DOSED IN MG ELEMENTAL CALCIUM) 500 MG chewable tablet Chew 1 tablet by mouth daily as needed for indigestion or heartburn.    . Cholecalciferol (VITAMIN D3 PO) Take 1 capsule by mouth daily at 12 noon.     . clopidogrel (PLAVIX) 75 MG tablet Take 1 tablet (75 mg total) by mouth daily. (Patient taking differently: Take 75 mg by mouth every morning. ) 30 tablet 0  . Emollient (CERAVE EX) Apply 1 application topically daily as needed (for itching and dryness).    Marland Kitchen escitalopram (LEXAPRO) 20 MG tablet Take 20 mg by mouth at bedtime.   1  . guaiFENesin-dextromethorphan (ROBITUSSIN DM) 100-10 MG/5ML syrup Take 2.5-5 mLs by mouth every 6 (six) hours as needed for cough. 118 mL 0  . hydrocortisone cream 1 % Apply 1 application topically daily as needed for itching.    . Magnesium Oxide (MAG-200 PO) Take by mouth daily.    . metoprolol tartrate (LOPRESSOR) 50 MG  tablet Take 25-50 mg by mouth See admin instructions. 50mg  in the morning and 25mg  in the evening    . Multiple Vitamin (MULTIVITAMIN ADULT PO) Take 1 tablet by mouth daily at 12 noon.    Marland Kitchen olmesartan (BENICAR) 20 MG tablet Take 20 mg by mouth in the morning and at bedtime.    . Omega-3 Fatty Acids (FISH OIL PO) Take 1 capsule by mouth every evening.     . Omega-3 Krill Oil 500 MG CAPS Take 1 capsule by mouth every evening.     Marland Kitchen omeprazole (PRILOSEC) 20 MG capsule Take 20 mg by mouth 2 (two) times daily before a meal.     . potassium chloride (K-DUR) 10 MEQ tablet Take 1 tablet (10 mEq total) by mouth daily. (Patient taking differently: Take 10 mEq  by mouth daily with supper. ) 5 tablet 0  . pravastatin (PRAVACHOL) 20 MG tablet Take 20 mg by mouth at bedtime.     . Probiotic Product (PROBIOTIC-10 PO) Take 1 capsule by mouth daily. RENEW Probiotic    . triamcinolone cream (KENALOG) 0.1 % Apply 1 application topically daily as needed (for itching).    . TURMERIC PO Take 1 capsule by mouth daily at 12 noon.      No current facility-administered medications for this visit.   Allergies:  Penicillins, Adhesive [tape], Codeine, Neosporin [neomycin-bacitracin zn-polymyx], and Sulfa antibiotics   Social History: The patient  reports that Claire West has never smoked. Claire West has never used smokeless tobacco. Claire West reports previous alcohol use. Claire West reports that Claire West does not use drugs.   Family History: The patient's family history includes Lung cancer in her mother and sister.   ROS:  Please see the history of present illness. Otherwise, complete review of systems is positive for none.  All other systems are reviewed and negative.   Physical Exam: VS:  BP 140/82   Pulse 75   Ht 5\' 6"  (1.676 m)   Wt 152 lb (68.9 kg)   SpO2 95%   BMI 24.53 kg/m , BMI Body mass index is 24.53 kg/m.  Wt Readings from Last 3 Encounters:  05/26/20 152 lb (68.9 kg)  04/13/20 140 lb (63.5 kg)  03/15/20 143 lb 11.8 oz (65.2 kg)      General: Patient appears comfortable at rest. Neck: Supple, no elevated JVP or carotid bruits, no thyromegaly. Lungs: Clear to auscultation, nonlabored breathing at rest. Cardiac: Regular rate and rhythm, no S3 or significant systolic murmur, no pericardial rub. Extremities: No pitting edema, distal pulses 2+. Skin: Warm and dry. Musculoskeletal: No kyphosis. Neuropsychiatric: Alert and oriented x3, affect grossly appropriate.  ECG:  04/13/2020 EKG showed sinus rhythm rate of 75, LVH, anterior Q waves possibly due to LVH.  Borderline T wave abnormalities.  Recent Labwork: 12/25/2019: B Natriuretic Peptide 65.0 03/15/2020: ALT 19; AST 20; Hemoglobin 13.5; Magnesium 2.0; Platelets 276 03/16/2020: BUN 10; Creatinine, Ser 0.57; Potassium 4.6; Sodium 131     Component Value Date/Time   CHOL 172 12/26/2019 0733   TRIG 95 12/26/2019 0733   HDL 48 12/26/2019 0733   CHOLHDL 3.6 12/26/2019 0733   VLDL 19 12/26/2019 0733   LDLCALC 105 (H) 12/26/2019 0733    Other Studies Reviewed Today:  CTA 03/21/2020  IMPRESSION: 1. Coronary calcium score of 1531. This was 7 percentile for age and sex matched control.  2. Moderate CAD. CADRADS 3. This study will be sent for FFRct analysis.  3. Normal coronary origin with right dominance.  4. Mild mitral annular calcification.  5. Aortic atherosclerosis.   Aorta: Normal size. Mild aortic root and descending aorta calcifications. No dissection.  Aortic Valve:  Trileaflet.  No calcifications.  Coronary Arteries:  Normal coronary origin.  Right dominance.  RCA is a large dominant artery that gives rise to PDA and PLVB. There is moderate (50-69%) long scattered calcified plaque in the proximal RCA. The mid RCA with mild (25-49%) calcified plaque. There is minimal (<24%) calcified plaque in the distal RCA.  Left main is a large artery that gives rise to LAD and LCX arteries.  LAD is a large vessel. The proximal LAD with a moderate  (50-69%) mixed lesion. The mid LAD with mild (25-49%). The distal LAD with no plaques.  LCX is a non-dominant artery that gives rise to one large OM1 branch.  There is no plaque.  Other findings:  Normal pulmonary vein drainage into the left atrium.  Normal left atrial appendage without a thrombus.  Normal size of the pulmonary artery.  Mild mitral annular calcification  EXAM: FFRCT ANALYSIS  FINDINGS: FFRct analysis was performed on the original cardiac CT angiogram dataset. Diagrammatic representation of the FFRct analysis is provided in a separate PDF document in PACS. This dictation was created using the PDF document and an interactive 3D model of the results. 3D model is not available in the EMR/PACS. Normal FFR range is >0.80.  1. Left Main: 0.98  2. LAD: Proximal LAD 0.96, Mid LAD 0.95, Distal LAD 0.85  3. LCX: 0.93  4. RCA: Proximal RCA 0.90, Mid RCA 0.87, Distal RCA 0.84  IMPRESSION: The FFRct analysis as described above, shows no evidence of any significant flow limiting lesion. Recommend Medical management.    Cardiac Monitor 03/29/2020 Study Highlights  NSR  No significant arrhythmia One episode with lead loss not real asystole     Echocardiogram 12/27/2019:  1. Left ventricular ejection fraction, by estimation, is 55 to 60%. The  left ventricle has normal function. The left ventricle has no regional  wall motion abnormalities. Left ventricular diastolic parameters are  consistent with Grade I diastolic  dysfunction (impaired relaxation).  2. Right ventricular systolic function is normal. The right ventricular  size is normal. Tricuspid regurgitation signal is inadequate for assessing  PA pressure.  3. The mitral valve is grossly normal. Trivial mitral valve  regurgitation.  4. The aortic valve was not well visualized. Aortic valve regurgitation  is mild.   Assessment and Plan:  1. DOE (dyspnea on exertion)   2. History  of CVA (cerebrovascular accident)   3. Essential hypertension   4. Atrial fibrillation, unspecified type ([place])    .1. DOE (dyspnea on exertion) Patient denies any recent dyspnea on exertion.  Claire West has not been very active on a daily basis since her CVA.  Claire West is using a walker to ambulate with and has some balance issues with some left-sided weakness.  2. History of CVA (cerebrovascular accident) Recent CT on 04/13/2020 showed redemonstrated chronic right PCA territory infarct.  Claire West also has stable mild generalized parenchymal atrophy and moderate chronic small vessel ischemic disease.  Claire West has some residual left-sided weakness with vision changes after CVA as well as some cognitive issues per daughter statement.  Also has some balance issues.  Claire West is using a walker to ambulate.  Continue aspirin 81 mg, Plavix 75 mg daily.  Continue pravastatin 20 mg daily.  3. Essential hypertension Blood pressure today is 140/82.  Patient's daughter states this is good for the patient.  Patient's daughter states blood pressure in the past has been much higher.  Continue continue metoprolol 50 mg p.o. twice daily, olmesartan 20 mg daily.  4. Atrial fibrillation, unspecified type [facility]) Reported atrial fibrillation.  However examination of EKGs by Dr. Bronson Ing and a subsequent cardiac monitor showed no evidence of atrial fibrillation.  Patient denies any palpitations or arrhythmias.  Medication Adjustments/Labs and Tests Ordered: Current medicines are reviewed at length with the patient today.  Concerns regarding medicines are outlined above.   Disposition: Follow-up with Dr. Harl Bowie or APP 6 months  Signed, Levell July, NP 05/26/2020 2:14 PM    [facility] [place], [place] [facility] [place], [place] [address] Phone: [phone]; Fax: [phone]

## 2020-05-26 ENCOUNTER — Ambulatory Visit: Payer: Medicare Other | Admitting: Cardiovascular Disease

## 2020-05-26 ENCOUNTER — Ambulatory Visit (INDEPENDENT_AMBULATORY_CARE_PROVIDER_SITE_OTHER): Payer: Medicare Other | Admitting: Family Medicine

## 2020-05-26 ENCOUNTER — Encounter: Payer: Self-pay | Admitting: Family Medicine

## 2020-05-26 VITALS — BP 140/82 | HR 75 | Ht 66.0 in | Wt 152.0 lb

## 2020-05-26 DIAGNOSIS — R0609 Other forms of dyspnea: Secondary | ICD-10-CM

## 2020-05-26 DIAGNOSIS — R0600 Dyspnea, unspecified: Secondary | ICD-10-CM | POA: Diagnosis not present

## 2020-05-26 DIAGNOSIS — Z8673 Personal history of transient ischemic attack (TIA), and cerebral infarction without residual deficits: Secondary | ICD-10-CM

## 2020-05-26 DIAGNOSIS — I4891 Unspecified atrial fibrillation: Secondary | ICD-10-CM | POA: Diagnosis not present

## 2020-05-26 DIAGNOSIS — I209 Angina pectoris, unspecified: Secondary | ICD-10-CM

## 2020-05-26 DIAGNOSIS — I10 Essential (primary) hypertension: Secondary | ICD-10-CM

## 2020-05-26 NOTE — Patient Instructions (Addendum)

## 2020-05-30 ENCOUNTER — Ambulatory Visit: Payer: Self-pay | Admitting: *Deleted

## 2020-05-31 ENCOUNTER — Other Ambulatory Visit: Payer: Self-pay | Admitting: *Deleted

## 2020-05-31 NOTE — Patient Outreach (Signed)
[facility]) Care Management  [place]  05/31/2020   KRYSSA RISENHOOVER 1941-08-23 [id number]   Telephone Assessment  Reassignment of Care Coordinator  received on 05/26/20   Chart Review , PMHX: includes but not limited to Acute CVA, Hypertension , Vascular Dementia, Amnestic MCI ( Mild cognitive impairment with memory loss), Frequent falls, Depression, Osteoarthritis .   Admissions: [facility] 3/19-3/22/21 Dx : CVA, [facility] 3/22- 01/15/20, [facility] 6/8-03/16/20, Dx: Syncope   Subjective:  Successful outreach call to patient son , [name], Designated Party Release.  Explained reason for follow up call and previous outreaches for prior Case Manager.  [name] states that patient s doing okay having some good days he states that she is a little less anxious. He reviewed a recent visit with PCP and adding Wellbutrin which he feels has made a difference.  He discussed patient recent hospital stay related to syncope, he reports no further episodes and they have received report from 30 day monitor that no heart rhythm problems identified.  She states patient has 24 hour hired caregiver support due her dementia, limits in peripheral vision since stroke. He reports patient having a fall a few days ago while trying to get oob to bedside commode at night when caregiver was busy . He states no injury. He states patient using a walker, and needs direction due to vision problem since stroke. No recurrent signs symptoms of stroke and verbalized symptoms of seek medical attention for. Patient has completed home health therapy.  [name] reports that he feels they are managing well at home. Caregivers assist patient with medications, appointments. They monitor blood pressure reading more than twice daily and keep a log.     Encounter Medications:  Outpatient Encounter Medications as of 05/31/2020  Medication Sig  . acetaminophen (TYLENOL) 500 MG tablet Take 500 mg  by mouth at bedtime. *May take one tablet daily as needed for pain*  . ALPRAZolam (XANAX) 0.5 MG tablet Take 1 tablet (0.5 mg total) by mouth at bedtime as needed for anxiety.  Marland Kitchen aspirin EC 81 MG tablet Take 81 mg by mouth every morning.   Marland Kitchen buPROPion (WELLBUTRIN XL) 150 MG 24 hr tablet Take 75 mg by mouth daily.  . calcium carbonate (TUMS - DOSED IN MG ELEMENTAL CALCIUM) 500 MG chewable tablet Chew 1 tablet by mouth daily as needed for indigestion or heartburn.  . Cholecalciferol (VITAMIN D3 PO) Take 1 capsule by mouth daily at 12 noon.   . clopidogrel (PLAVIX) 75 MG tablet Take 1 tablet (75 mg total) by mouth daily. (Patient taking differently: Take 75 mg by mouth every morning. )  . Emollient (CERAVE EX) Apply 1 application topically daily as needed (for itching and dryness).  Marland Kitchen escitalopram (LEXAPRO) 20 MG tablet Take 20 mg by mouth at bedtime.   Marland Kitchen guaiFENesin-dextromethorphan (ROBITUSSIN DM) 100-10 MG/5ML syrup Take 2.5-5 mLs by mouth every 6 (six) hours as needed for cough.  . hydrocortisone cream 1 % Apply 1 application topically daily as needed for itching.  . Magnesium Oxide (MAG-200 PO) Take by mouth daily.  . metoprolol tartrate (LOPRESSOR) 50 MG tablet Take 25-50 mg by mouth See admin instructions. 50mg  in the morning and 25mg  in the evening  . Multiple Vitamin (MULTIVITAMIN ADULT PO) Take 1 tablet by mouth daily at 12 noon.  Marland Kitchen olmesartan (BENICAR) 20 MG tablet Take 20 mg by mouth in the morning and at bedtime.  . Omega-3 Fatty Acids (FISH OIL  PO) Take 1 capsule by mouth every evening.   . Omega-3 Krill Oil 500 MG CAPS Take 1 capsule by mouth every evening.   Marland Kitchen omeprazole (PRILOSEC) 20 MG capsule Take 20 mg by mouth 2 (two) times daily before a meal.   . potassium chloride (K-DUR) 10 MEQ tablet Take 1 tablet (10 mEq total) by mouth daily. (Patient taking differently: Take 10 mEq by mouth daily with supper. )  . pravastatin (PRAVACHOL) 20 MG tablet Take 20 mg by mouth at bedtime.   .  Probiotic Product (PROBIOTIC-10 PO) Take 1 capsule by mouth daily. RENEW Probiotic  . triamcinolone cream (KENALOG) 0.1 % Apply 1 application topically daily as needed (for itching).  . TURMERIC PO Take 1 capsule by mouth daily at 12 noon.    No facility-administered encounter medications on file as of 05/31/2020.    Functional Status:  In your present state of health, do you have any difficulty performing the following activities: 03/15/2020 02/02/2020  Hearing? N -  Vision? N -  Difficulty concentrating or making decisions? Y -  Walking or climbing stairs? Y -  Dressing or bathing? Y -  Doing errands, shopping? Y -  Conservation officer, nature and eating ? - -  Using the Toilet? - -  In the past six months, have you accidently leaked urine? - Y  Do you have problems with loss of bowel control? - N  Managing your Medications? - -  Managing your Finances? - -  Housekeeping or managing your Housekeeping? - -  Some recent data might be hidden    Fall/Depression Screening: Fall Risk  01/22/2020 12/17/2019 10/28/2018  Falls in the past year? 1 1 1   Number falls in past yr: 1 0 1  Injury with Fall? 0 1 1  Risk for fall due to : History of fall(s);Impaired balance/gait - Impaired balance/gait;Impaired mobility;History of fall(s)  Follow up Education provided;Falls prevention discussed - -   PHQ 2/9 Scores 01/22/2020  Exception Documentation Other- indicate reason in comment box  Not completed Unable to speak with patient, has dementia, and only able to speak with patient's son.    Assessment:   History of Recent CVA- no recurrent symptoms Hypertension - continues taking medications as prescribed and monitoring blood pressures and understands to notify MD of abnormal readings. Dementia- less anxious , continued with forgetfulness, has 24 caregiver support, good appetite and intake . Active with palliative care for monthly visits. Neurology visit on 06/09/20.   Plan:  Will plan follow call in the next  month and assess for ongoing care management needs.    Joylene Draft, RN, BSN  [place] Management Coordinator  [phone]- Mobile [phone]- Toll Free Main Office

## 2020-06-07 DIAGNOSIS — F329 Major depressive disorder, single episode, unspecified: Secondary | ICD-10-CM | POA: Diagnosis not present

## 2020-06-07 DIAGNOSIS — R350 Frequency of micturition: Secondary | ICD-10-CM | POA: Diagnosis not present

## 2020-06-07 DIAGNOSIS — N342 Other urethritis: Secondary | ICD-10-CM | POA: Diagnosis not present

## 2020-06-07 DIAGNOSIS — Z6826 Body mass index (BMI) 26.0-26.9, adult: Secondary | ICD-10-CM | POA: Diagnosis not present

## 2020-06-07 DIAGNOSIS — E663 Overweight: Secondary | ICD-10-CM | POA: Diagnosis not present

## 2020-06-09 ENCOUNTER — Ambulatory Visit: Payer: Self-pay | Admitting: Adult Health

## 2020-06-09 ENCOUNTER — Ambulatory Visit (INDEPENDENT_AMBULATORY_CARE_PROVIDER_SITE_OTHER): Payer: Medicare Other | Admitting: Neurology

## 2020-06-09 ENCOUNTER — Other Ambulatory Visit: Payer: Self-pay

## 2020-06-09 ENCOUNTER — Encounter: Payer: Self-pay | Admitting: Neurology

## 2020-06-09 VITALS — BP 158/91 | HR 85 | Ht 66.0 in | Wt 148.0 lb

## 2020-06-09 DIAGNOSIS — F0150 Vascular dementia without behavioral disturbance: Secondary | ICD-10-CM

## 2020-06-09 DIAGNOSIS — G3183 Dementia with Lewy bodies: Secondary | ICD-10-CM

## 2020-06-09 DIAGNOSIS — I209 Angina pectoris, unspecified: Secondary | ICD-10-CM

## 2020-06-09 MED ORDER — MEMANTINE HCL 5 MG PO TABS: 5.0000 mg | ORAL_TABLET | Freq: Two times a day (BID) | ORAL | 5 refills | Status: DC

## 2020-06-09 NOTE — Patient Instructions (Addendum)
Lewy Body Dementia Lewy body dementia, also called dementia with Lewy bodies, is a condition that affects the way the brain functions. It is one form of dementia. In this condition, proteins called Lewy bodies build up in certain areas of the brain. This causes problems with:  Memory.  Decision making.  Behavior.  Speaking.  Thinking.  Movements and balance.  Problem solving. This condition is progressive, which means that it gets worse with time (is degenerative) and cannot be reversed. What are the causes? This condition is caused by the buildup of Lewy bodies in brain cells in areas of the brain that control memory, thinking, and movement. It is not known what causes the Lewy bodies to build up. What increases the risk? You are more likely to develop this condition if you:  Have a family history of Lewy body dementia or Parkinson's disease.  Are 33 years old or older.  Are female. What are the signs or symptoms? Symptoms of this condition may include:  Symptoms of dementia, such as: ? Trouble with memory. ? Trouble paying attention. ? Problems with planning and organizing. ? Problems with judgment. ? Behavioral problems.  Symptoms of Parkinson's disease, such as: ? Shaking movements that you cannot control (tremor). Tremors usually start in a hand or foot when you are resting (resting tremor). ? Stooped posture. ? Slowing of movement. ? Stiff muscles (rigidity). ? Loss of balance and stability when standing.  Seeing things that are not there (hallucinating).  Changes in memory, attention, and concentration that come and go (fluctuation).  Sleep problems, such as acting out dreams while you are asleep. How is this diagnosed? This condition is diagnosed by a specialist who diagnoses and treats this condition (neurologist). Your health care provider will talk with you and your family, friends, or caregivers about your history and symptoms. A thorough medical history  will be taken, and you will have a physical exam and tests. Tests may include:  Lab tests, such as blood or urine tests.  Imaging tests, such as a CT scan, a PET scan, or an MRI.  A test that involves removing and testing a small amount of the fluid that surrounds the brain and spinal cord (lumbar puncture).  A test where small metal discs are used to measure electrical activity in the brain (electroencephalogram or EEG).  Tests that evaluate brain function, such as memory tests, cognitive tests, and neuropsychological tests. How is this treated? There is no cure for this condition. Treatment focuses on managing your symptoms. Treatment may include:  Medicines. Everyone responds to medicines differently. Your response may change over time. Work with your health care provider to find the best medicines for you.  Speech, occupational, and physical therapy. Your health care provider can help direct you to support groups, organizations, and other health care providers who can help with decisions about your care. Follow these instructions at home: Medicine  Take over-the-counter and prescription medicines only as told by your health care provider.  To help you manage your medicines, use a pill organizer or pill reminder.  Avoid taking medicines that can affect thinking, such as pain medicines or sleeping medicines. Lifestyle  Make healthy lifestyle choices: ? Be physically active as told by your health care provider. ? Do not use any products that contain nicotine or tobacco, such as cigarettes, e-cigarettes, and chewing tobacco. If you need help quitting, ask your health care provider. ? Try to practice stress-management techniques when you experience stress, such as mindfulness, yoga,  or deep breathing. ? Stay socially connected. Talk regularly with other people, such as family, friends, and neighbors.  Make sure you sleep well. These tips can help you get a good night's rest: ? Avoid  napping during the day. ? Keep your sleeping area dark and cool. ? Avoid exercising a few hours before you go to bed. ? Avoid caffeine products in the evening. Eating and drinking  Do not drink alcohol.  Drink enough fluid to keep your urine pale yellow.  Eat a healthy diet. Safety      Work with your health care provider to determine what you need help with and what your safety needs are.  If you have trouble moving around, use a cane or walker as told by your health care provider.  Make sure your home environment is safe. To do this: ? Remove things that can be a tripping hazard, such as throw rugs or clutter. ? Install grab bars and railings in your home to prevent falls.  Talk with your health care provider about if it is safe for you to drive.  If you were given a bracelet that identifies you as a person with memory loss or tracks your location, make sure to wear it at all times. General instructions  Work with your family to make important decisions, such as advance directives, medical power of attorney, or a living will.  Keep all follow-up visits as told by your health care provider. This is important. Contact a health care provider if you have:  A fever. Memantine extended release capsules What is this medicine? MEMANTINE (MEM an teen) is used to treat dementia caused by Alzheimer's disease. This medicine may be used for other purposes; ask your health care provider or pharmacist if you have questions. COMMON BRAND NAME(S): Namenda XR What should I tell my health care provider before I take this medicine? They need to know if you have any of these conditions:  difficulty passing urine  kidney disease  liver disease  seizures  an unusual or allergic reaction to memantine, other medicines, foods, dyes, or preservatives  pregnant or trying to get pregnant  breast-feeding How should I use this medicine? Take this medicine by mouth with a glass of water.  Follow the directions on the prescription label. You may take this medicine with or without food. You may swallow the capsules whole or open them and sprinkle the entire contents on applesauce before swallowing. Other than sprinkling the medicine on applesauce, the capsules should be swallowed whole and not divided, chewed, or crushed. Take your doses at regular intervals. Do not take your medicine more often than directed. Continue to take your medicine even if you feel better. Do not stop taking except on the advice of your doctor or health care professional. Talk to your pediatrician regarding the use of this medicine in children. Special care may be needed. Overdosage: If you think you have taken too much of this medicine contact a poison control center or emergency room at once. NOTE: This medicine is only for you. Do not share this medicine with others. What if I miss a dose? If you miss a dose, take it as soon as you can. If it is almost time for your next dose, take only that dose. Do not take double or extra doses. If you do not take your medicine for several days, contact your health care provider. Your dose may need to be changed. What may interact with this medicine?  acetazolamide  amantadine  cimetidine  dextromethorphan  dofetilide  hydrochlorothiazide  ketamine  metformin  methazolamide  quinidine  ranitidine  sodium bicarbonate  triamterene This list may not describe all possible interactions. Give your health care provider a list of all the medicines, herbs, non-prescription drugs, or dietary supplements you use. Also tell them if you smoke, drink alcohol, or use illegal drugs. Some items may interact with your medicine. What should I watch for while using this medicine? Visit your doctor or health care professional for regular checks on your progress. Check with your doctor or health care professional if there is no improvement in your symptoms or if they get  worse. You may get drowsy or dizzy. Do not drive, use machinery, or do anything that needs mental alertness until you know how this drug affects you. Do not stand or sit up quickly, especially if you are an older patient. This reduces the risk of dizzy or fainting spells. Alcohol can make you more drowsy and dizzy. Avoid alcoholic drinks. What side effects may I notice from receiving this medicine? Side effects that you should report to your doctor or health care professional as soon as possible:  agitation or a feeling of restlessness  allergic reactions like skin rash, itching or hives, swelling of the face, lips, or tongue  depressed mood  dizziness  hallucinations  redness, blistering, peeling or loosening of the skin, including inside the mouth  seizures  vomiting Side effects that usually do not require medical attention (report to your doctor or health care professional if they continue or are bothersome):  constipation  diarrhea  headache  nausea  trouble sleeping This list may not describe all possible side effects. Call your doctor for medical advice about side effects. You may report side effects to FDA at 1-800-FDA-1088. Where should I keep my medicine? Keep out of the reach of children. Store at room temperature between 15 degrees and 30 degrees C (59 degrees and 86 degrees F). Throw away any unused medicine after the expiration date. NOTE: This sheet is a summary. It may not cover all possible information. If you have questions about this medicine, talk to your doctor, pharmacist, or health care provider.  2020 Elsevier/Gold Standard (2015-10-27 10:09:47)  Problems with choking or swallowing.  Any symptoms of a new or different illness.  New or worsening trouble with sleeping or increased daytime sleepiness.  New or worsening confusion. Get help right away if:  You feel depressed, sad, or feel that you want to harm yourself.  Your family members become  concerned for your safety. If you ever feel like you may hurt yourself or others, or have thoughts about taking your own life, get help right away. You can go to your nearest emergency department or call:  Your local emergency services (911 in the [place]  A suicide crisis helpline, such as the [facility] at (508)027-0904. This is open 24 hours a day. Summary  Dementia is a condition that affects the way the brain functions. It often affects memory and thinking.  Lewy body dementia is a degenerative dementia.  This condition is caused by the buildup of proteins called Lewy bodies in brain cells. It is not known what causes the Lewy bodies to build up.  Work with your health care provider to determine what you need help with and what your safety needs are.  Your health care provider can help direct you to support groups, organizations, and other health care providers who can  help with decisions about your care. This information is not intended to replace advice given to you by your health care provider. Make sure you discuss any questions you have with your health care provider. Document Revised: 11/20/2018 Document Reviewed: 11/20/2018 Elsevier Patient Education  [place].

## 2020-06-09 NOTE — Progress Notes (Signed)
Provider:  Larey Seat, M D  Referring Provider: Sharilyn Sites, MD Primary Care Physician:  Sharilyn Sites, MD  Chief Complaint  Patient presents with   Follow-up    pt with caretaker, (and son)rm 10. since may she has developed more confusion along with agitation. there was a night where she was agitated and combative. she is having to use the clonazepam more frequently. they cut her wellbutrin down in dose and  from XR  To IR and that has helped her somewhat in the evening.    06-09-2020: RV I have the pleasure of seeing Claire West today she is a 79 year old Caucasian patient primary care is from Dr. Sharilyn Sites, MD and is seen here today in the presence of her caregiver her son is waiting outside and I will get some additional information from him.  It seems like the since May 13th- when we last saw Claire West she has had more behavior problems being nocturnally agitated highly anxious restless and confused.  There was a night when she was combative as well and the clonazepam has been more frequently used which has been prescribed as needed for this purpose.  Her primary care physician cut her dose of Wellbutrin and reverted from the XR form to immediate release form and this seems to have helped her.  Wellbutrin is a stimulating agent to some degree and it may not have been the best choice for her.  She has had a urine test and was positive for bacterial UTI_   Initially a diagnosis of memory disturbance-amnestic MCI but since 2018 she has converted to dementia.  She also has some vascular cerebrovascular disease.  Stable the right posterior cerebral artery stroke of which we made mention in the last visit.  She has had some low blood pressures in the past and UTI which has caused more confusion. She is seeing things at night and during the day, but the agitation is more a nocturnal thing.  She sees people outside and nobody else cold see them. She sees birds, and hallucinates . No auditory  hallucinations. LEWY BODY DEMENTIA.  Fully vaccinated.       02-18-2020: Claire West is a 79 y.o. female patient, seen with confusion, but no more falls, since treated hypotension, no more weakness. UTI caused more confusion. She followed 14 days after our last visit with the local ED,  On 25 December 2019 just the day before her birthday.  She was seen by Dr. Denton Brick for altered mental status.  She had again worsened over the sick several days prior to this emergency room visit repeating the same statements and questions over and over and was appearing paranoid concerned frightened.  There was also some weakness in her left upper extremity that just occurred over the past 3 days prior to her visit.  She had been started by me on Namenda but did not tolerated but she was able to complete a prescription for Valtrex.  She ambulated with a walker at baseline at the time here today she is seated in a wheelchair but she can transfer.  The emergency room ordered a CT A of the brain first and this showed a new stroke located in the right PCA territory with involvement of the occipital brain and right hippocampus.  There was also a small focus within the right lateral thalamus.  The MRA was heavily motion degraded.  The CT was also suggestive of bilateral bilateral cervical internal carotid artery fibromuscular dysplasia  interpreted by Dr. Ulyses Jarred.  The brain MRI confirmed the acute infarction in the right posterior cerebral artery mild swelling but no evidence of hemorrhage which places the date of the stroke at 3 to 5 days prior.  Chronic ischemic changes elsewhere.  Interpreted by Dr. Nelson Chimes. [facility] [place] but started on medication she had not tolerated before, she was sedated and hypotensive . She couldn't feed herself as she didn't find ( see !) the food .  The lesion explains her further very restricted visual field. She has only some central vision  remaining. Has not seen cardiology yet , but needs to.      12-[birth year]1999: Claire West is a 79 y.o. female patient, seen with confusion, disorientation and agitation. She  Has good and bad days, she falls a bit less frequently as she now uses a walker. She is unsteady at night, and more agitated. More impatient . No visual hallucinations. She is holding on to the walls or furniture.  Here today with elevated BP, metoprolol and benicar.  She is constipated. No depression, good appetite.  MMSE today 19/ 30- visual spatial deficit- Lewy body? Has no visual hallucinations, appears worried. Anxious. We discussed xanax low dose - she did not ask for pain medicine.    10-28-2018 with caretaker and sister, having fallen many time. Her caretaker has noted that she falls after turning head or body- off balance. She falls over the walker, with the cane.  I was able today to see a photograph on I asked Mrs. Bedoya when she had fallen and she had quite a significant goose egg on her right forehead, bruising over the face, she seems to tend to fall straight forward.  I was also made aware that she had a previous admission to hospital on 15 November when she had suddenly felt hot she had been shopping during the day was in a department store as a family went home she developed slurred speech and seemed not quite oriented to her surroundings, her son called EMS and she was admitted to [facility] from there transferred to [facility] for an MRI.  She had a normal urine analysis is very little nitrate, I doubt that she had a UTI at the time.  She had hypertension and she was given hydralazine as her blood pressure exceeded 160/100.  She was continued on Toprol and Diovan.  She was also given Lovenox as a DVT prophylaxis.  Again the patient was transferred after observation.  To [facility] where an MRI of the brain was obtained which showed thank goodness no acute strokes or intracranial bleed.   The neuro hospitalist Dr. Virgina Norfolk evaluated her up and at the time of his examination her extremities moved with normal strength and coordination bilateral pupils equally responsive, she had a symmetric smile he did not find any dysarthria.  Labs showed high triglycerides, overall cholesterol normal limits, she was also having a sodium level of 134 glucose level of 117 but she had not been fast.  White blood cell count was 6.0 no indication of infection.  CT of the head stable focal right frontal white matter hypodensity encephalomalacia, age-related involutional state changes.  No hypodensity, atherosclerosis of the sixth carotid siphons were seen.  Also atherosclerosis of both vertebral arteries noted.  MRI brain moderate intracranial atherosclerosis.  Severe stenosis of the left V4 and left P2 segments no emergent large vessel occlusion.  Chest x-ray  no acute pulmonary process and mild cardiomegaly.     RV with son and caretaker CNA, 07-07-2018. I had the pleasure of seeing this patient on December 24, 2017 the day before her 7 first day.  See history below.  We held off after our last visit on starting any kind of memory supportive medication as I was not sure that the patient has a ongoing chronic metabolic encephalopathy or rather a delirium but had something to do with the medication and conditions she was treated in the hospital for.  She also had acute electrolyte imbalances which can cause confusion memory loss and sometimes even pseudo-psychotic episodes.  Today she performed well on a Mini-Mental Status Examination she was fully oriented to [facility] [place] and state, she knew it was 66 September but not sure about the day of the week.  She was able to spell a word backwards which gave her 4 out of 5 possible points she recalled 3 immediate recall words, she follows all commands fluently and performed also verbal and written commands.  Visual-spatial orientation was intact. She has daytime  caregivers, urine incontinence is a problem, and she forgets medications ,meals, but she is not longer in delirium. .   Inspite of this MMSE was 27/30 points today! Her husband is in memory care, her brother in law had frontal dementia, a sister in law with lymphoma.   She has undergone a knee replacement and has undergone exercises and PT concluded just last week , walks with a walker. She feels unsteady- She has a swollen left leg, with a hematoma under the surgical knee site.   MMSE - Mini Mental State Exam 06/09/2020 12/17/2019 06/17/2019 07/07/2018 12/24/2017  Not completed: - - (No Data) - -  Orientation to time 1 4 5 4 4   Orientation to Place 3 4 5 5 4   Registration 3 3 3 3 3   Attention/ Calculation 2 2 4 3 4   Recall 0 1 1 3 2   Language- name 2 objects 2 2 2 2 1   Language- repeat 1 1 1 1 1   Language- follow 3 step command 3 3 3 3 3   Language- read & follow direction 1 1 1 1 1   Write a sentence 1 1 1 1 1   Copy design 0 1 1 1  0  Total score 17 23 27 27 24                                                               28/ 30 - I used WORLD backwards instead of serial 7s.    07-07-2018 I added a MOCA  21/ 30 points- this is indicative of early dementia.     HRI :Consult for this 79 year old female patient who is seen here as a referral from Dr. Hilma Favors for follow up on a metabolic encephalopathy late January and early February 2019, and earlier an episode in November 2018. shehas a history of HTN, osteoarthritis and 2 strokes.  Neurology  I have the pleasure of seeing Mrs. Freundlich  on 24 December [birth year], 79 year old Caucasian right-handed female patient of Dr. Hilma Favors presents after a hospital admission between January 30 and November 08, 2017 when she was diagnosed with acute metabolic encephalopathy hyponatremia, hypokalemia.  It was assumed that  blood pressure medications which she has taken for over a decade had caused the electrolyte imbalance.  There were also previous episodes of low  potassium in late Ultram 2018 noticed.  I would like to "her current medications which include aspirin, Citrucel, multivitamins, calcium, tramadol, oxybutynin chloride, omeprazole, meloxicam, escitalopram, losartan, hydrochlorothiazide and metoprolol.  She no longer takes meloxicam, tramadol , oxybutynin. She feels better but she still has sundowning, asks more repetitive questions to wards the evening, more forgetful.   She is often frustrated and her family is concerned about her driving. The patient's Blood pressure control is very important to slow the progression of neuro micro-vascular disease.  I will revisit with her in 4 month and offered in the meanwhile start her on Namenda , with the goal to start aricept as addition. She is at this time inclined to wait, have repeat memory testing and would than decide in 4 month to start medication or not.      Review of Systems: Out of a complete 14 system review, the patient complains of only the following symptoms, and all other reviewed systems are negative.  confusion, repetitive questions, walking unsteadiness. Cane - knee is "Bone on bone"   Social History   Socioeconomic History   Marital status: Married    Spouse name: Not on file   Number of children: Not on file   Years of education: Not on file   Highest education level: Not on file  Occupational History   Not on file  Tobacco Use   Smoking status: Never Smoker   Smokeless tobacco: Never Used  Vaping Use   Vaping Use: Never used  Substance and Sexual Activity   Alcohol use: Not Currently    Comment: occ   Drug use: No   Sexual activity: Never    Birth control/protection: Surgical  Other Topics Concern   Not on file  Social History Narrative   Not on file   Social Determinants of Health   Financial Resource Strain:    Difficulty of Paying Living Expenses: Not on file  Food Insecurity:    Worried About Charity fundraiser in the Last Year: Not on file    [employer] of Food in the Last Year: Not on file  Transportation Needs: No Transportation Needs   Lack of Transportation (Medical): No   Lack of Transportation (Non-Medical): No  Physical Activity:    Days of Exercise per Week: Not on file   Minutes of Exercise per Session: Not on file  Stress:    Feeling of Stress : Not on file  Social Connections:    Frequency of Communication with Friends and Family: Not on file   Frequency of Social Gatherings with Friends and Family: Not on file   Attends Religious Services: Not on file   Active Member of Clubs or Organizations: Not on file   Attends Archivist Meetings: Not on file   Marital Status: Not on file  Intimate Partner Violence:    Fear of Current or Ex-Partner: Not on file   Emotionally Abused: Not on file   Physically Abused: Not on file   Sexually Abused: Not on file    Family History  Problem Relation Age of Onset   Lung cancer Mother    Lung cancer Sister     Past Medical History:  Diagnosis Date   Arthritis    Depression    Gait instability    walks with cane   GERD (gastroesophageal reflux  disease)    History of blood transfusion    Hyperlipemia    Hypertension    Knee pain    Metabolic encephalopathy    Mild cognitive impairment with memory loss    Seasonal allergies    Skin cancer    Stroke (HCC)    TIA (transient ischemic attack)     Past Surgical History:  Procedure Laterality Date   ABDOMINAL HYSTERECTOMY     ARTHROSCOPIC REPAIR ACL Left    COLONOSCOPY     COLONOSCOPY N/A 11/23/2015   Procedure: COLONOSCOPY;  Surgeon: Rogene Houston, MD;  Location: [facility];  Service: Endoscopy;  Laterality: N/A;  830   EYE SURGERY Bilateral    Cataracts   SKIN CANCER EXCISION     Right shoulder   TOTAL KNEE ARTHROPLASTY Left 05/05/2018   Procedure: LEFT TOTAL KNEE ARTHROPLASTY;  Surgeon: Gaynelle Arabian, MD;  Location: [facility];  Service: Orthopedics;   Laterality: Left;  with block   TOTAL KNEE ARTHROPLASTY Right 06/22/2019   Procedure: TOTAL KNEE ARTHROPLASTY;  Surgeon: Gaynelle Arabian, MD;  Location: [facility];  Service: Orthopedics;  Laterality: Right;  33min    Current Outpatient Medications  Medication Sig Dispense Refill   acetaminophen (TYLENOL) 500 MG tablet Take 500 mg by mouth at bedtime. *May take one tablet daily as needed for pain*     ALPRAZolam (XANAX) 0.5 MG tablet Take 1 tablet (0.5 mg total) by mouth at bedtime as needed for anxiety. 30 tablet 0   aspirin EC 81 MG tablet Take 81 mg by mouth every morning.      buPROPion (WELLBUTRIN) 75 MG tablet Take 75 mg by mouth 2 (two) times daily.     calcium carbonate (TUMS - DOSED IN MG ELEMENTAL CALCIUM) 500 MG chewable tablet Chew 1 tablet by mouth daily as needed for indigestion or heartburn.     Cholecalciferol (VITAMIN D3 PO) Take 1 capsule by mouth daily at 12 noon.      clopidogrel (PLAVIX) 75 MG tablet Take 1 tablet (75 mg total) by mouth daily. (Patient taking differently: Take 75 mg by mouth every morning. ) 30 tablet 0   Emollient (CERAVE EX) Apply 1 application topically daily as needed (for itching and dryness).     escitalopram (LEXAPRO) 20 MG tablet Take 20 mg by mouth at bedtime.   1   guaiFENesin-dextromethorphan (ROBITUSSIN DM) 100-10 MG/5ML syrup Take 2.5-5 mLs by mouth every 6 (six) hours as needed for cough. 118 mL 0   hydrocortisone cream 1 % Apply 1 application topically daily as needed for itching.     Magnesium Oxide (MAG-200 PO) Take by mouth daily.     metoprolol tartrate (LOPRESSOR) 50 MG tablet Take 25-50 mg by mouth See admin instructions. 50mg  in the morning and 25mg  in the evening     Multiple Vitamin (MULTIVITAMIN ADULT PO) Take 1 tablet by mouth daily at 12 noon.     olmesartan (BENICAR) 20 MG tablet Take 20 mg by mouth in the morning and at bedtime.     Omega-3 Fatty Acids (FISH OIL PO) Take 1 capsule by mouth every evening.       Omega-3 Krill Oil 500 MG CAPS Take 1 capsule by mouth every evening.      omeprazole (PRILOSEC) 20 MG capsule Take 20 mg by mouth 2 (two) times daily before a meal.      potassium chloride (K-DUR) 10 MEQ tablet Take 1 tablet (10 mEq total) by mouth daily. (Patient taking  differently: Take 10 mEq by mouth daily with supper. ) 5 tablet 0   pravastatin (PRAVACHOL) 20 MG tablet Take 20 mg by mouth at bedtime.      Probiotic Product (PROBIOTIC-10 PO) Take 1 capsule by mouth daily. RENEW Probiotic     triamcinolone cream (KENALOG) 0.1 % Apply 1 application topically daily as needed (for itching).     TURMERIC PO Take 1 capsule by mouth daily at 12 noon.      No current facility-administered medications for this visit.    Allergies as of 06/09/2020 - Review Complete 06/09/2020  Allergen Reaction Noted   Penicillins Anaphylaxis, Swelling, and Rash 07/08/2012   Adhesive [tape] Itching and Rash 08/22/2018   Codeine Hives and Rash 07/08/2012   Neosporin [neomycin-bacitracin zn-polymyx] Rash 04/23/2018   Sulfa antibiotics Hives and Rash 07/08/2012     CLINICAL DATA:  Initial evaluation for acute altered mental status, confusion, lethargy.  EXAM: MRI HEAD WITHOUT CONTRAST  TECHNIQUE: Multiplanar, multiecho pulse sequences of the brain and surrounding structures were obtained without intravenous contrast.  COMPARISON:  Prior CT from 11/04/2017.  FINDINGS: Brain: Generalized age-related cerebral atrophy. Extensive T2/FLAIR hyperintensity involving the periventricular and deep white matter both cerebral hemispheres, most consistent with chronic microvascular ischemic changes. Chronic microvascular disease present within the pons as well. Superimposed remote lacunar infarcts present within the bilateral basal ganglia, deep white matter, and right thalamus.  No abnormal foci of restricted diffusion to suggest acute or subacute ischemia. Gray-white matter differentiation  maintained. No evidence for remote cortical infarction. No acute intracranial hemorrhage. Small chronic microhemorrhage noted within the right thalamus.  No mass lesion, midline shift or mass effect. No hydrocephalus. No extra-axial fluid collection. Major dural sinuses are grossly patent.  Pituitary gland suprasellar region normal. Midline structures intact and normal.  Vascular: Major intracranial vascular flow voids are maintained at the skull base.  Skull and upper cervical spine: Craniocervical junction within normal limits. Mild degenerate spondylolysis noted within the upper cervical spine without significant stenosis. Bone marrow signal intensity within normal limits. No scalp soft tissue abnormality.  Sinuses/Orbits: Globes and orbital soft tissues within normal limits. Patient status post cataract extraction bilaterally. Scattered mucosal thickening within the ethmoidal air cells and maxillary sinuses. Paranasal sinuses are otherwise clear. No significant mastoid effusion. Inner ear structures grossly normal.  Other: None.  IMPRESSION: 1. No acute intracranial abnormality. 2. Generalized age-related cerebral atrophy with advanced chronic microvascular ischemic disease with scattered remote lacunar infarcts as above.   Electronically Signed   By: Jeannine Boga M.D.   On: 11/06/2017 20:52    Vitals: BP (!) 158/91    Pulse 85    Ht 5\' 6"  (1.676 m)    Wt 148 lb (67.1 kg)    BMI 23.89 kg/m  Last Weight:  Wt Readings from Last 1 Encounters:  06/09/20 148 lb (67.1 kg)   Last Height:   Ht Readings from Last 1 Encounters:  06/09/20 5\' 6"  (1.676 m)    Physical exam:  General: The patient is not fully  alert and appears not in acute distress. The patient is well groomed. Head: Normocephalic, atraumatic. Neck is supple. Mallampati 1, neck circumference:14- Cardiovascular:  Regular rate and rhythm , without  murmurs or carotid bruit, and without  distended neck veins. Respiratory: Lungs are clear to auscultation. Skin:  Without evidence of edema, or rash Trunk: stooped posture. Leaning to the right side.   Neurologic exam :   Memory subjective described as ' slow " There  is a normal attention span & concentration ability-" small talk". Speech is fluent without dysarthria, dysphonia or aphasia. Mood and affect are appropriate.  MMSE - Mini Mental State Exam 06/09/2020 12/17/2019 06/17/2019  Not completed: - - (No Data)  Orientation to time 1 4 5   Orientation to Place 3 4 5   Registration 3 3 3   Attention/ Calculation 2 2 4   Recall 0 1 1  Language- name 2 objects 2 2 2   Language- repeat 1 1 1   Language- follow 3 step command 3 3 3   Language- read & follow direction 1 1 1   Write a sentence 1 1 1   Copy design 0 1 1  Total score 17 23 27    I did not do MOCA testing. Today, 02-18-2020.   Cranial nerves: Pupils are equal and briskly reactive to light. Saccadic, she is peripherally blind, she can read a paper placed in the lower right corner of the visual field. She is almost blind.  Hearing to finger rub intact.  Facial sensation intact to fine touch. Facial motor strength is symmetric and tongue and uvula move midline.  Tongue protrusion into either cheek is normal.  Shoulder shrug is normal.   Motor exam:  Seated in a wheelchair   Sensory:  Fine touch vibration were absent in feet and reduced in both knees.  Proprioception was abnormal  Coordination: shuffling gait.  Needs to be held by her hand.  Gait and station: Patient no longer walks with a cane -shuffling . Smaller step width.   I worked with the patient and held her right hand was evident that she is leaning towards the right side, and when she turned her turns were very fragmented she is taking very small steps more than 4 steps to turn 180 degrees,  I had her also turn her head left and right which did not lead to a fall but her gait slowed which tells me that she felt  insecure or at least off balance and lightheaded.  Deep tendon reflexes: in the  upper and lower extremities are symmetric 2 plus - and intact.  Babinski maneuver response is downgoing.  Assessment:  Dear Dr. Hilma Favors,  After physical and neurologic examination, review of laboratory studies, imaging, neurophysiology testing and pre-existing records, assessment of our mutual patient is  that of :   Advanced microvascular diease and arthrosclerosis, larger vessels such as carotid syphons and vertebral arteries were affected, explaining the fall tendency after a head turn.  She has had some low blood pressures in the past and UTI which has caused more confusion. She is now treated for UTI and there is hypertension, not hypotension.   STROKE CAUSED VISUAL FIELD RESTRICTION, ASSOCIATED GAIT INSTABILITY, HIGHER DEGREE OF FEAR. She l is left with the lower right quadrant of her visual field. There is still a part unaccounted for and I believe we are dealing with LBD.  She is seeing things at night and during the day, but the agitation is more a nocturnal thing.  She sees people outside and nobody else cold see them. She sees birds, and hallucinates .   No auditory hallucinations. LEWY BODY DEMENTIA.   Plan:  Treatment plan and additional workup : Namenda- 5 mg bid - gentle titration.     Asencion Partridge Gorden Stthomas MD 06/09/2020

## 2020-06-20 DIAGNOSIS — M19011 Primary osteoarthritis, right shoulder: Secondary | ICD-10-CM | POA: Diagnosis not present

## 2020-06-20 DIAGNOSIS — M7541 Impingement syndrome of right shoulder: Secondary | ICD-10-CM | POA: Diagnosis not present

## 2020-06-21 ENCOUNTER — Other Ambulatory Visit: Payer: Self-pay | Admitting: *Deleted

## 2020-06-21 NOTE — Patient Outreach (Signed)
[facility]) Care Management  06/21/2020  Claire West 1941-07-05 [id number]   This encounter was created in error - please disregard.  Joylene Draft, RN, BSN  [facility] Management Coordinator  [phone]- Mobile [phone]- Toll Free Main Office

## 2020-06-21 NOTE — Patient Outreach (Addendum)
[facility]) Care Management  [place]  06/21/2020   Claire West Aug 26, 1941 [id number]   Telephone Assessment  Reassignment of Care Coordinator  received on 05/26/20   Chart Review , PMHX: includes but not limited to Acute CVA, Hypertension , Vascular Dementia, Amnestic MCI ( Mild cognitive impairment with memory loss), Frequent falls, Depression, Osteoarthritis .   Admissions: [facility] 3/19-3/22/21 Dx : CVA, [facility] 3/22- 01/15/20, [facility] 6/8-03/16/20, Dx: Syncope   Subjective:  Successful outreach call to patient son, [name], Designated party release.  He discussed patient doing okay today, he discussed her complaint of shoulder discomfort unsure which shoulder , she visited orthopedic MD on yesterday and received cortisone injection . He states she reports it feels better.  [name] discussed patient recent visit to neurologist and patient being on Namenda she states so far she is tolerating medication okay. He voiced concern regarding patient Dementia - memory problems, disorientation. ,not recognizing family members.     Encounter Medications:  Outpatient Encounter Medications as of 06/21/2020  Medication Sig  . acetaminophen (TYLENOL) 500 MG tablet Take 500 mg by mouth at bedtime. *May take one tablet daily as needed for pain*  . ALPRAZolam (XANAX) 0.5 MG tablet Take 1 tablet (0.5 mg total) by mouth at bedtime as needed for anxiety.  Marland Kitchen aspirin EC 81 MG tablet Take 81 mg by mouth every morning.   Marland Kitchen buPROPion (WELLBUTRIN) 75 MG tablet Take 75 mg by mouth 2 (two) times daily.  . calcium carbonate (TUMS - DOSED IN MG ELEMENTAL CALCIUM) 500 MG chewable tablet Chew 1 tablet by mouth daily as needed for indigestion or heartburn.  . Cholecalciferol (VITAMIN D3 PO) Take 1 capsule by mouth daily at 12 noon.   . clopidogrel (PLAVIX) 75 MG tablet Take 1 tablet (75 mg total) by mouth daily. (Patient taking differently: Take 75 mg by  mouth every morning. )  . Emollient (CERAVE EX) Apply 1 application topically daily as needed (for itching and dryness).  Marland Kitchen escitalopram (LEXAPRO) 20 MG tablet Take 20 mg by mouth at bedtime.   Marland Kitchen guaiFENesin-dextromethorphan (ROBITUSSIN DM) 100-10 MG/5ML syrup Take 2.5-5 mLs by mouth every 6 (six) hours as needed for cough.  . hydrocortisone cream 1 % Apply 1 application topically daily as needed for itching.  . Magnesium Oxide (MAG-200 PO) Take by mouth daily.  . memantine (NAMENDA) 5 MG tablet Take 1 tablet (5 mg total) by mouth 2 (two) times daily.  . metoprolol tartrate (LOPRESSOR) 50 MG tablet Take 25-50 mg by mouth See admin instructions. 50mg  in the morning and 25mg  in the evening  . Multiple Vitamin (MULTIVITAMIN ADULT PO) Take 1 tablet by mouth daily at 12 noon.  Marland Kitchen olmesartan (BENICAR) 20 MG tablet Take 20 mg by mouth in the morning and at bedtime.  . Omega-3 Fatty Acids (FISH OIL PO) Take 1 capsule by mouth every evening.   . Omega-3 Krill Oil 500 MG CAPS Take 1 capsule by mouth every evening.   Marland Kitchen omeprazole (PRILOSEC) 20 MG capsule Take 20 mg by mouth 2 (two) times daily before a meal.   . potassium chloride (K-DUR) 10 MEQ tablet Take 1 tablet (10 mEq total) by mouth daily. (Patient taking differently: Take 10 mEq by mouth daily with supper. )  . pravastatin (PRAVACHOL) 20 MG tablet Take 20 mg by mouth at bedtime.   . Probiotic Product (PROBIOTIC-10 PO) Take 1 capsule by mouth daily. RENEW Probiotic  . triamcinolone  cream (KENALOG) 0.1 % Apply 1 application topically daily as needed (for itching).  . TURMERIC PO Take 1 capsule by mouth daily at 12 noon.    No facility-administered encounter medications on file as of 06/21/2020.    Functional Status:  In your present state of health, do you have any difficulty performing the following activities: 03/15/2020 02/02/2020  Hearing? N -  Vision? N -  Difficulty concentrating or making decisions? Y -  Walking or climbing stairs? Y -   Dressing or bathing? Y -  Doing errands, shopping? Y -  Conservation officer, nature and eating ? - -  Using the Toilet? - -  In the past six months, have you accidently leaked urine? - Y  Do you have problems with loss of bowel control? - N  Managing your Medications? - -  Managing your Finances? - -  Housekeeping or managing your Housekeeping? - -  Some recent data might be hidden    Fall/Depression Screening: Fall Risk  01/22/2020 12/17/2019 10/28/2018  Falls in the past year? 1 1 1   Number falls in past yr: 1 0 1  Injury with Fall? 0 1 1  Risk for fall due to : History of fall(s);Impaired balance/gait - Impaired balance/gait;Impaired mobility;History of fall(s)  Follow up Education provided;Falls prevention discussed - -   PHQ 2/9 Scores 01/22/2020  Exception Documentation Other- indicate reason in comment box  Not completed Unable to speak with patient, has dementia, and only able to speak with patient's son.    Assessment:   Hypertension  Consistent monitoring of blood pressures several times a day, keeping a log and notifying MD  of concerns per patient son. He is unable to states recent readings.  No recent signs symptoms of stroke.   Dementia Recent neurology visit, medication management . Patient has 24 hour caregiver support. No recent falls since last visit, bed alarm in place a night . Patient son declines need for additional Dementia resource information appreciates outreach support call.   Patient son agreeable to ongoing telephonic outreach support/education  for follow up on chronic conditions dementia and hypertension.    Plan:  Will plan follow up call in the next 2 months.  Will send PCP visit note     Joylene Draft, RN, BSN  [place] Management Coordinator  [phone]- Mobile [phone]- [place]

## 2020-06-22 ENCOUNTER — Ambulatory Visit (HOSPITAL_COMMUNITY): Payer: Medicare Other

## 2020-06-26 DIAGNOSIS — M47816 Spondylosis without myelopathy or radiculopathy, lumbar region: Secondary | ICD-10-CM | POA: Diagnosis not present

## 2020-06-26 DIAGNOSIS — G3183 Dementia with Lewy bodies: Secondary | ICD-10-CM | POA: Diagnosis not present

## 2020-06-26 DIAGNOSIS — I878 Other specified disorders of veins: Secondary | ICD-10-CM | POA: Diagnosis not present

## 2020-06-26 DIAGNOSIS — I10 Essential (primary) hypertension: Secondary | ICD-10-CM | POA: Diagnosis not present

## 2020-06-26 DIAGNOSIS — R0902 Hypoxemia: Secondary | ICD-10-CM | POA: Diagnosis not present

## 2020-06-26 DIAGNOSIS — S72101A Unspecified trochanteric fracture of right femur, initial encounter for closed fracture: Secondary | ICD-10-CM | POA: Diagnosis not present

## 2020-06-26 DIAGNOSIS — Z20822 Contact with and (suspected) exposure to covid-19: Secondary | ICD-10-CM | POA: Diagnosis not present

## 2020-06-26 DIAGNOSIS — M25551 Pain in right hip: Secondary | ICD-10-CM | POA: Diagnosis not present

## 2020-06-26 DIAGNOSIS — S72141A Displaced intertrochanteric fracture of right femur, initial encounter for closed fracture: Secondary | ICD-10-CM | POA: Diagnosis not present

## 2020-06-26 DIAGNOSIS — R52 Pain, unspecified: Secondary | ICD-10-CM | POA: Diagnosis not present

## 2020-06-26 DIAGNOSIS — W19XXXA Unspecified fall, initial encounter: Secondary | ICD-10-CM | POA: Diagnosis not present

## 2020-06-26 DIAGNOSIS — Z01818 Encounter for other preprocedural examination: Secondary | ICD-10-CM | POA: Diagnosis not present

## 2020-06-27 DIAGNOSIS — R451 Restlessness and agitation: Secondary | ICD-10-CM | POA: Diagnosis not present

## 2020-06-27 DIAGNOSIS — F0150 Vascular dementia without behavioral disturbance: Secondary | ICD-10-CM | POA: Diagnosis present

## 2020-06-27 DIAGNOSIS — Z20822 Contact with and (suspected) exposure to covid-19: Secondary | ICD-10-CM | POA: Diagnosis present

## 2020-06-27 DIAGNOSIS — Z7902 Long term (current) use of antithrombotics/antiplatelets: Secondary | ICD-10-CM | POA: Diagnosis not present

## 2020-06-27 DIAGNOSIS — K219 Gastro-esophageal reflux disease without esophagitis: Secondary | ICD-10-CM | POA: Diagnosis present

## 2020-06-27 DIAGNOSIS — Z7982 Long term (current) use of aspirin: Secondary | ICD-10-CM | POA: Diagnosis not present

## 2020-06-27 DIAGNOSIS — S72144A Nondisplaced intertrochanteric fracture of right femur, initial encounter for closed fracture: Secondary | ICD-10-CM | POA: Diagnosis not present

## 2020-06-27 DIAGNOSIS — N189 Chronic kidney disease, unspecified: Secondary | ICD-10-CM | POA: Diagnosis present

## 2020-06-27 DIAGNOSIS — R0902 Hypoxemia: Secondary | ICD-10-CM | POA: Diagnosis not present

## 2020-06-27 DIAGNOSIS — R413 Other amnesia: Secondary | ICD-10-CM | POA: Diagnosis present

## 2020-06-27 DIAGNOSIS — S72001A Fracture of unspecified part of neck of right femur, initial encounter for closed fracture: Secondary | ICD-10-CM | POA: Insufficient documentation

## 2020-06-27 DIAGNOSIS — Z882 Allergy status to sulfonamides status: Secondary | ICD-10-CM | POA: Diagnosis not present

## 2020-06-27 DIAGNOSIS — I509 Heart failure, unspecified: Secondary | ICD-10-CM | POA: Diagnosis present

## 2020-06-27 DIAGNOSIS — S72101A Unspecified trochanteric fracture of right femur, initial encounter for closed fracture: Secondary | ICD-10-CM | POA: Diagnosis not present

## 2020-06-27 DIAGNOSIS — M1712 Unilateral primary osteoarthritis, left knee: Secondary | ICD-10-CM | POA: Diagnosis not present

## 2020-06-27 DIAGNOSIS — I69398 Other sequelae of cerebral infarction: Secondary | ICD-10-CM | POA: Diagnosis not present

## 2020-06-27 DIAGNOSIS — E785 Hyperlipidemia, unspecified: Secondary | ICD-10-CM | POA: Diagnosis present

## 2020-06-27 DIAGNOSIS — R41841 Cognitive communication deficit: Secondary | ICD-10-CM | POA: Diagnosis not present

## 2020-06-27 DIAGNOSIS — R531 Weakness: Secondary | ICD-10-CM | POA: Diagnosis not present

## 2020-06-27 DIAGNOSIS — R52 Pain, unspecified: Secondary | ICD-10-CM | POA: Diagnosis not present

## 2020-06-27 DIAGNOSIS — Z96651 Presence of right artificial knee joint: Secondary | ICD-10-CM | POA: Diagnosis present

## 2020-06-27 DIAGNOSIS — G3184 Mild cognitive impairment, so stated: Secondary | ICD-10-CM | POA: Diagnosis not present

## 2020-06-27 DIAGNOSIS — I130 Hypertensive heart and chronic kidney disease with heart failure and stage 1 through stage 4 chronic kidney disease, or unspecified chronic kidney disease: Secondary | ICD-10-CM | POA: Diagnosis present

## 2020-06-27 DIAGNOSIS — F419 Anxiety disorder, unspecified: Secondary | ICD-10-CM | POA: Diagnosis present

## 2020-06-27 DIAGNOSIS — D62 Acute posthemorrhagic anemia: Secondary | ICD-10-CM | POA: Diagnosis not present

## 2020-06-27 DIAGNOSIS — G3183 Dementia with Lewy bodies: Secondary | ICD-10-CM | POA: Diagnosis present

## 2020-06-27 DIAGNOSIS — F329 Major depressive disorder, single episode, unspecified: Secondary | ICD-10-CM | POA: Diagnosis present

## 2020-06-27 DIAGNOSIS — R2689 Other abnormalities of gait and mobility: Secondary | ICD-10-CM | POA: Diagnosis not present

## 2020-06-27 DIAGNOSIS — Z743 Need for continuous supervision: Secondary | ICD-10-CM | POA: Diagnosis not present

## 2020-06-27 DIAGNOSIS — R4182 Altered mental status, unspecified: Secondary | ICD-10-CM | POA: Diagnosis not present

## 2020-06-27 DIAGNOSIS — I10 Essential (primary) hypertension: Secondary | ICD-10-CM | POA: Diagnosis not present

## 2020-06-27 DIAGNOSIS — G8918 Other acute postprocedural pain: Secondary | ICD-10-CM | POA: Diagnosis not present

## 2020-06-27 DIAGNOSIS — M6250 Muscle wasting and atrophy, not elsewhere classified, unspecified site: Secondary | ICD-10-CM | POA: Diagnosis not present

## 2020-06-27 DIAGNOSIS — S72141A Displaced intertrochanteric fracture of right femur, initial encounter for closed fracture: Secondary | ICD-10-CM | POA: Diagnosis present

## 2020-06-27 DIAGNOSIS — F0280 Dementia in other diseases classified elsewhere without behavioral disturbance: Secondary | ICD-10-CM | POA: Diagnosis present

## 2020-06-27 DIAGNOSIS — H538 Other visual disturbances: Secondary | ICD-10-CM | POA: Diagnosis present

## 2020-06-27 DIAGNOSIS — Z9989 Dependence on other enabling machines and devices: Secondary | ICD-10-CM | POA: Diagnosis not present

## 2020-06-27 DIAGNOSIS — Z880 Allergy status to penicillin: Secondary | ICD-10-CM | POA: Diagnosis not present

## 2020-06-27 DIAGNOSIS — Z79899 Other long term (current) drug therapy: Secondary | ICD-10-CM | POA: Diagnosis not present

## 2020-06-27 DIAGNOSIS — R278 Other lack of coordination: Secondary | ICD-10-CM | POA: Diagnosis not present

## 2020-06-27 DIAGNOSIS — I69351 Hemiplegia and hemiparesis following cerebral infarction affecting right dominant side: Secondary | ICD-10-CM | POA: Diagnosis not present

## 2020-06-27 DIAGNOSIS — M6281 Muscle weakness (generalized): Secondary | ICD-10-CM | POA: Diagnosis not present

## 2020-06-27 DIAGNOSIS — F331 Major depressive disorder, recurrent, moderate: Secondary | ICD-10-CM | POA: Diagnosis not present

## 2020-06-27 HISTORY — DX: Fracture of unspecified part of neck of right femur, initial encounter for closed fracture: S72.001A

## 2020-06-29 DIAGNOSIS — D62 Acute posthemorrhagic anemia: Secondary | ICD-10-CM | POA: Insufficient documentation

## 2020-06-29 HISTORY — DX: Acute posthemorrhagic anemia: D62

## 2020-07-01 DIAGNOSIS — S72001D Fracture of unspecified part of neck of right femur, subsequent encounter for closed fracture with routine healing: Secondary | ICD-10-CM | POA: Diagnosis not present

## 2020-07-01 DIAGNOSIS — R41841 Cognitive communication deficit: Secondary | ICD-10-CM | POA: Diagnosis not present

## 2020-07-01 DIAGNOSIS — D62 Acute posthemorrhagic anemia: Secondary | ICD-10-CM | POA: Diagnosis not present

## 2020-07-01 DIAGNOSIS — S72009A Fracture of unspecified part of neck of unspecified femur, initial encounter for closed fracture: Secondary | ICD-10-CM | POA: Diagnosis not present

## 2020-07-01 DIAGNOSIS — R4182 Altered mental status, unspecified: Secondary | ICD-10-CM | POA: Diagnosis not present

## 2020-07-01 DIAGNOSIS — R531 Weakness: Secondary | ICD-10-CM | POA: Diagnosis not present

## 2020-07-01 DIAGNOSIS — E785 Hyperlipidemia, unspecified: Secondary | ICD-10-CM | POA: Diagnosis not present

## 2020-07-01 DIAGNOSIS — R0902 Hypoxemia: Secondary | ICD-10-CM | POA: Diagnosis not present

## 2020-07-01 DIAGNOSIS — I10 Essential (primary) hypertension: Secondary | ICD-10-CM | POA: Diagnosis not present

## 2020-07-01 DIAGNOSIS — T148XXA Other injury of unspecified body region, initial encounter: Secondary | ICD-10-CM | POA: Diagnosis not present

## 2020-07-01 DIAGNOSIS — Z20828 Contact with and (suspected) exposure to other viral communicable diseases: Secondary | ICD-10-CM | POA: Diagnosis not present

## 2020-07-01 DIAGNOSIS — R278 Other lack of coordination: Secondary | ICD-10-CM | POA: Diagnosis not present

## 2020-07-01 DIAGNOSIS — Z515 Encounter for palliative care: Secondary | ICD-10-CM | POA: Diagnosis not present

## 2020-07-01 DIAGNOSIS — K219 Gastro-esophageal reflux disease without esophagitis: Secondary | ICD-10-CM | POA: Diagnosis not present

## 2020-07-01 DIAGNOSIS — F331 Major depressive disorder, recurrent, moderate: Secondary | ICD-10-CM | POA: Diagnosis not present

## 2020-07-01 DIAGNOSIS — W19XXXD Unspecified fall, subsequent encounter: Secondary | ICD-10-CM | POA: Diagnosis not present

## 2020-07-01 DIAGNOSIS — Z96651 Presence of right artificial knee joint: Secondary | ICD-10-CM | POA: Diagnosis not present

## 2020-07-01 DIAGNOSIS — M6281 Muscle weakness (generalized): Secondary | ICD-10-CM | POA: Diagnosis not present

## 2020-07-01 DIAGNOSIS — M1712 Unilateral primary osteoarthritis, left knee: Secondary | ICD-10-CM | POA: Diagnosis not present

## 2020-07-01 DIAGNOSIS — F0390 Unspecified dementia without behavioral disturbance: Secondary | ICD-10-CM | POA: Diagnosis not present

## 2020-07-01 DIAGNOSIS — G3183 Dementia with Lewy bodies: Secondary | ICD-10-CM | POA: Diagnosis not present

## 2020-07-01 DIAGNOSIS — F329 Major depressive disorder, single episode, unspecified: Secondary | ICD-10-CM | POA: Diagnosis not present

## 2020-07-01 DIAGNOSIS — R2689 Other abnormalities of gait and mobility: Secondary | ICD-10-CM | POA: Diagnosis not present

## 2020-07-01 DIAGNOSIS — Z743 Need for continuous supervision: Secondary | ICD-10-CM | POA: Diagnosis not present

## 2020-07-01 DIAGNOSIS — E782 Mixed hyperlipidemia: Secondary | ICD-10-CM | POA: Diagnosis not present

## 2020-07-04 ENCOUNTER — Telehealth: Payer: Self-pay | Admitting: Neurology

## 2020-07-04 DIAGNOSIS — E782 Mixed hyperlipidemia: Secondary | ICD-10-CM | POA: Diagnosis not present

## 2020-07-04 DIAGNOSIS — I10 Essential (primary) hypertension: Secondary | ICD-10-CM | POA: Diagnosis not present

## 2020-07-04 DIAGNOSIS — M1712 Unilateral primary osteoarthritis, left knee: Secondary | ICD-10-CM | POA: Diagnosis not present

## 2020-07-04 DIAGNOSIS — G3183 Dementia with Lewy bodies: Secondary | ICD-10-CM | POA: Diagnosis not present

## 2020-07-04 DIAGNOSIS — S72001D Fracture of unspecified part of neck of right femur, subsequent encounter for closed fracture with routine healing: Secondary | ICD-10-CM | POA: Diagnosis not present

## 2020-07-04 DIAGNOSIS — K219 Gastro-esophageal reflux disease without esophagitis: Secondary | ICD-10-CM | POA: Diagnosis not present

## 2020-07-04 DIAGNOSIS — D62 Acute posthemorrhagic anemia: Secondary | ICD-10-CM | POA: Diagnosis not present

## 2020-07-04 DIAGNOSIS — W19XXXD Unspecified fall, subsequent encounter: Secondary | ICD-10-CM | POA: Diagnosis not present

## 2020-07-04 DIAGNOSIS — F329 Major depressive disorder, single episode, unspecified: Secondary | ICD-10-CM | POA: Diagnosis not present

## 2020-07-04 NOTE — Telephone Encounter (Signed)
Patient's son has asked to d/c namenda as it makes patient to sleepy and prone to fall. I will d/c the medication .   Dr Maudry Mayhew, MD is with [facility] physicians, please send him a CC.

## 2020-07-04 NOTE — Telephone Encounter (Signed)
Pt's son(on DPR)DONOVANT,GARY has called to report pt fell and is now @ [facility] recovering.  Son states pt just wants to sleep all day as a result of being on memantine (NAMENDA) 5 MG tablet.  Son is asking if an order from Dr Brett Fairy can be sent to the staff at [facility] to stop pt's NAMENDA.The # for Ritta Slot is [phone] the Dr's name is Jori Moll Polite# [phone] he is off site.  Please call

## 2020-07-04 NOTE — Addendum Note (Signed)
Addended by: Larey Seat on: 07/04/2020 12:49 PM   Modules accepted: Orders

## 2020-07-04 NOTE — Telephone Encounter (Signed)
Called the number provided for Claire West's and it continued to ring and there was no answer. LVM on machine asking for someone to contact our office to discuss patient's care.   **If someone from facility calls, please advise of son's message below and that we were calling to give a verbal order to DC memantadine.(Namenda). If they would like something in writing I can fax this order to them, I would need you I would attention fax to as well as fax number.

## 2020-07-06 DIAGNOSIS — E785 Hyperlipidemia, unspecified: Secondary | ICD-10-CM | POA: Diagnosis not present

## 2020-07-06 DIAGNOSIS — I10 Essential (primary) hypertension: Secondary | ICD-10-CM | POA: Diagnosis not present

## 2020-07-06 DIAGNOSIS — S72009A Fracture of unspecified part of neck of unspecified femur, initial encounter for closed fracture: Secondary | ICD-10-CM | POA: Diagnosis not present

## 2020-07-06 DIAGNOSIS — F0390 Unspecified dementia without behavioral disturbance: Secondary | ICD-10-CM | POA: Diagnosis not present

## 2020-07-11 DIAGNOSIS — E782 Mixed hyperlipidemia: Secondary | ICD-10-CM | POA: Diagnosis not present

## 2020-07-11 DIAGNOSIS — G3183 Dementia with Lewy bodies: Secondary | ICD-10-CM | POA: Diagnosis not present

## 2020-07-11 DIAGNOSIS — S72001D Fracture of unspecified part of neck of right femur, subsequent encounter for closed fracture with routine healing: Secondary | ICD-10-CM | POA: Diagnosis not present

## 2020-07-11 DIAGNOSIS — M1712 Unilateral primary osteoarthritis, left knee: Secondary | ICD-10-CM | POA: Diagnosis not present

## 2020-07-11 DIAGNOSIS — F329 Major depressive disorder, single episode, unspecified: Secondary | ICD-10-CM | POA: Diagnosis not present

## 2020-07-11 DIAGNOSIS — I10 Essential (primary) hypertension: Secondary | ICD-10-CM | POA: Diagnosis not present

## 2020-07-13 ENCOUNTER — Other Ambulatory Visit: Payer: Self-pay | Admitting: *Deleted

## 2020-07-13 NOTE — Patient Outreach (Signed)
[facility]) Care Management  07/13/2020  Claire West December 01, 1940 [id number]   Care Coordination   Reviewed Epic, patient admitted to [facility] on 9/19 after fall at home , closed fracture right hip, transferred to [facility] on 9/20 , s/p fixation of right hip fracture and discharged to [facility] on 9/24.  Per patient ping patient remains at [facility] as of 07/13/20.   Plan  Will close [facility] care management program, monitor ping for patient discharge.    Joylene Draft, RN, BSN  [place] Management Coordinator  [phone]- Mobile [phone]- Toll Free Main Office

## 2020-07-14 ENCOUNTER — Other Ambulatory Visit: Payer: Self-pay

## 2020-07-14 ENCOUNTER — Non-Acute Institutional Stay: Payer: Medicare Other | Admitting: Nurse Practitioner

## 2020-07-14 DIAGNOSIS — Z515 Encounter for palliative care: Secondary | ICD-10-CM

## 2020-07-14 NOTE — Progress Notes (Signed)
[place] Consult Note Telephone: [phone]  Fax: [phone]  PATIENT NAME: Claire West [address] [place] [address] [phone] (home)  DOB: Sep 27, 1941 MRN: [id number]  PRIMARY CARE PROVIDER:    Sharilyn Sites, MD,  [address] [place] [address] [phone]  REFERRING PROVIDER:   Sharilyn Sites, [place],  [place] [address] [phone]  RESPONSIBLE PARTY:   Extended Emergency Contact Information Primary Emergency Contact: Gloriann Loan, [place] [address] Johnnette Litter of [place] Phone: [phone] Mobile Phone: [phone] Relation: Sister Secondary Emergency Contact: [facility] Mobile Phone: [phone] Relation: Son  I met face to face with patient in facility.  ASSESSMENT AND RECOMMENDATIONS:   1. Advance Care Planning/Goals of Care:  Goal of care: Patient unable to participate in discussion. Would not answer questions, appears to understand question but has difficulty saying words. Unable to reach sister to review goals of care, message left with my number for her to call me for update. Directives: Patient code status is full code.  2. Symptom Management: Patient is s/p fixation of right hip fracture after a fall on 06/27/2020. Patient discharged to [facility] and rehab on 07/01/2020 for rehabilitation and nursing services. Per nursing, patient's therapy and rehab stay is furthered slowed down due to patient's comorbid conditions. Awaiting updated progress report from rehab director, called and left a message. Patient voiced no concerns on exam today, no sign of acute distress observed. Palliative care will continue to provide support to patient, family and the medical team.   3. Follow up Palliative Care Visit: Palliative care will continue to follow for goals of care clarification and symptom management. Return 4 weeks or prn.  4. Family  /Caregiver/Community Supports: Patient was reported to have a supportive son and sister. She used to have personal care providers when she was at home. One of the care givers Phineas Semen was present during visit today.  5. Cognitive / Functional decline: Patient awake and alert, oriented to self. Unable to answer questions about where she is or what day it is. Former Orthoptist visiting with patient at the time of visit, assisting in feeding patient. Patient is dependent on staff for all of ADLs, including feeding.  I spent 30 minutes providing this consultation, time includes time spent with patient/family, chart review, and documentation. More than 50% of the time in this consultation was spent coordinating communication.   HISTORY OF PRESENT ILLNESS:  PRESLEY GORA is a 79 y.o. year old female with multiple medical problems including Arthritis, gait instability (wheelchair dependent), mild cognitive impairment with memory loss, depression, hx of CVA. Patient s/p closed fracture right hip from fall, transferred to [facility] on 9/20, s/p fixation of right hip fracture and discharged to [facility] on 9/24. Palliative Care was asked to follow this patient by consultation request of Sharilyn Sites, MD to help address advance care planning and goals of care. This is a follow up visit from 04/12/2020.  CODE STATUS: Full Code  PPS: 30%  HOSPICE ELIGIBILITY/DIAGNOSIS: TBD  PAST MEDICAL HISTORY:  Past Medical History:  Diagnosis Date  . Arthritis   . Depression   . Gait instability    walks with cane  . GERD (gastroesophageal reflux disease)   . History of blood transfusion   . Hyperlipemia   . Hypertension   . Knee pain   . Metabolic encephalopathy   .  Mild cognitive impairment with memory loss   . Seasonal allergies   . Skin cancer   . Stroke ([place])   . TIA (transient ischemic attack)     SOCIAL HX:  Social History   Tobacco Use  . Smoking status: Never  Smoker  . Smokeless tobacco: Never Used  Substance Use Topics  . Alcohol use: Not Currently    Comment: occ   FAMILY HX:  Family History  Problem Relation Age of Onset  . Lung cancer Mother   . Lung cancer Sister     ALLERGIES:  Allergies  Allergen Reactions  . Penicillins Anaphylaxis, Swelling and Rash    Oral rash/peeling  **Has tolerated Rocephin, Vantin since 2017**    . Adhesive [Tape] Itching and Rash    Irritation at site  . Codeine Hives and Rash  . Neosporin [Neomycin-Bacitracin Zn-Polymyx] Rash  . Sulfa Antibiotics Hives and Rash     PERTINENT MEDICATIONS:  Outpatient Encounter Medications as of 07/14/2020  Medication Sig  . acetaminophen (TYLENOL) 500 MG tablet Take 500 mg by mouth at bedtime. *May take one tablet daily as needed for pain*  . ALPRAZolam (XANAX) 0.5 MG tablet Take 1 tablet (0.5 mg total) by mouth at bedtime as needed for anxiety.  Marland Kitchen aspirin EC 81 MG tablet Take 81 mg by mouth every morning.   Marland Kitchen buPROPion (WELLBUTRIN) 75 MG tablet Take 75 mg by mouth 2 (two) times daily.  . calcium carbonate (TUMS - DOSED IN MG ELEMENTAL CALCIUM) 500 MG chewable tablet Chew 1 tablet by mouth daily as needed for indigestion or heartburn.  . Cholecalciferol (VITAMIN D3 PO) Take 1 capsule by mouth daily at 12 noon.   . clopidogrel (PLAVIX) 75 MG tablet Take 1 tablet (75 mg total) by mouth daily. (Patient taking differently: Take 75 mg by mouth every morning. )  . Emollient (CERAVE EX) Apply 1 application topically daily as needed (for itching and dryness).  Marland Kitchen escitalopram (LEXAPRO) 20 MG tablet Take 20 mg by mouth at bedtime.   Marland Kitchen guaiFENesin-dextromethorphan (ROBITUSSIN DM) 100-10 MG/5ML syrup Take 2.5-5 mLs by mouth every 6 (six) hours as needed for cough.  . hydrocortisone cream 1 % Apply 1 application topically daily as needed for itching.  . Magnesium Oxide (MAG-200 PO) Take by mouth daily.  . metoprolol tartrate (LOPRESSOR) 50 MG tablet Take 25-50 mg by mouth See  admin instructions. 38m in the morning and 250min the evening  . Multiple Vitamin (MULTIVITAMIN ADULT PO) Take 1 tablet by mouth daily at 12 noon.  . Marland Kitchenlmesartan (BENICAR) 20 MG tablet Take 20 mg by mouth in the morning and at bedtime.  . Omega-3 Fatty Acids (FISH OIL PO) Take 1 capsule by mouth every evening.   . Omega-3 Krill Oil 500 MG CAPS Take 1 capsule by mouth every evening.   . Marland Kitchenmeprazole (PRILOSEC) 20 MG capsule Take 20 mg by mouth 2 (two) times daily before a meal.   . potassium chloride (K-DUR) 10 MEQ tablet Take 1 tablet (10 mEq total) by mouth daily. (Patient taking differently: Take 10 mEq by mouth daily with supper. )  . pravastatin (PRAVACHOL) 20 MG tablet Take 20 mg by mouth at bedtime.   . Probiotic Product (PROBIOTIC-10 PO) Take 1 capsule by mouth daily. RENEW Probiotic  . triamcinolone cream (KENALOG) 0.1 % Apply 1 application topically daily as needed (for itching).  . TURMERIC PO Take 1 capsule by mouth daily at 12 noon.    No facility-administered encounter medications  on file as of 07/14/2020.    PHYSICAL EXAM / ROS:   General: frail appearing, thin, sitting in wheelchair being fed lunch Cardiovascular: denied chest pain, no edema  Pulmonary: no cough, no SOB, on supplemental oxygen at 2L Abdomen: appetite fair, denied constipation, incontinent of bowel GU: denies dysuria, incontinent of urine MSK:  no joint and ROM abnormalities, ambulatory Skin: dry and warm Neurological: Weakness, neck bent to right   Jari Favre, DNP, AGPCNP-BC

## 2020-07-18 ENCOUNTER — Other Ambulatory Visit: Payer: Self-pay | Admitting: *Deleted

## 2020-07-18 NOTE — Patient Outreach (Signed)
Member screened for potential [facility] Care Management needs as a benefit of [place] [employer].  Per Patient Pear[place]tha [place] member resides in [facility].   Communication sent to  SW to collaborate about anticipated dc plans and potential [facility] Care Management needs.  Member previously active with [place] Management. Will plan outreach to family to discuss anticipated transition plans.  Marthenia Rolling, MSN-Ed, RN,BSN [place] Acute Care Coordinator [phone] [facility]) [phone]  (Toll free office)

## 2020-07-28 ENCOUNTER — Other Ambulatory Visit: Payer: Self-pay

## 2020-07-28 ENCOUNTER — Non-Acute Institutional Stay: Payer: Medicare Other | Admitting: Nurse Practitioner

## 2020-07-28 DIAGNOSIS — Z515 Encounter for palliative care: Secondary | ICD-10-CM

## 2020-07-28 NOTE — Progress Notes (Signed)
Claire West Consult Note Telephone: [phone]  Fax: [phone]  PATIENT NAME: Claire West [place] [address] [phone] (home)  DOB: 1941/09/12 MRN: [id number]  PRIMARY CARE PROVIDER:    Sharilyn Sites, MD,  [address] [place] [address] [phone]  REFERRING PROVIDER:   Sharilyn Sites, [address] [place],  [place] [address] [phone]  RESPONSIBLE PARTY:   Extended Emergency Contact Information Primary Emergency Contact: Gloriann Loan, [place] [address] Johnnette Litter of [place] Phone: [phone] Mobile Phone: [phone] Relation: Sister Secondary Emergency Contact: [facility] Mobile Phone: [phone] Relation: Son  I met face to face with patient and family in home/facility. Patient was former [facility] patient.  ASSESSMENT AND RECOMMENDATIONS:   1. Advance Care Planning/Goals of Care:  Goal of care: Discussed held over the phone with patient's son [name]. Per son, goal of care is comfort, family does not want extra ordinary things done to keep patient alive. Directives: After discussing code status and why it is important to have a plan in place, so that patient or their family is not making decisions at the last minute or in an emergency. Son expressed appreciation for the explanation that DNR form only comes in place when patient has no pulse or not breathing in the essence when patient is clinically dead, son made aware that DNR does not mean "do not treat", assured son that patient will continue to recieve treatments for any and all acute illness that family or patient desires to have. Son elected for patient to remain a Full Code until he discuss the issue with patient's sister. Palliative care will continue to provide support to patient, family and the medical team.   2.  Symptom management / Cognitive / Functional decline: Patient is s/p fixation of right  hip fracture secondary to fall on 06/27/2020. Patient discharged to [facility] and rehab on 07/01/2020 for rehabilitation and nursing services. Per therapy, patient not progressing with rehab stay. Patient continue to require total assist with ADLs, including feeding, patient sometimes able to feed self finger foods after set up, on nutritional supplement. She uses [employer] fast streamer for all transfers. She is bed/wheelchair bound. Son plan for patient to remain in SNF as he is unable to care for patient at home. Patient more awake and alert during visit today, she is however disoriented to place, time and situation. Unable to provide reliable history. She denied pain, no evidence of acute distress observed. Recommend continuing supportive care with goal of symptoms control and promotion of comfort.  3. Follow up Palliative Care Visit: Palliative care will continue to follow for goals of care clarification and symptom management. Return in about weeks or prn.  4. Family /Caregiver/Community Supports: Patient lived at home before fall and admission to rehab. Patient had 24hr/7days care at home for 2 years per son. Patient Husband has Lewy body Dementia lives in a nursing home. Son very involved in her care. Staff report her sister visits her daily at the facility.  I spent 48 minutes providing this consultation, time includes time spent with patient and son on phone, chart review, and documentation. More than 50% of the time in this consultation was spent coordinating communication.   HISTORY OF PRESENT ILLNESS:  ROLLA West is a 79 y.o. year old female with multiple medical problems including Arthritis, gait instability (wheelchair dependent), mild cognitive impairment with memory loss, depression, hx of  CVA. Patient s/p closed fracture right hip from fall, transferred to [facility] on 9/20, s/p fixation of right hip fracture and discharged to [facility] on 9/24. Palliative Care was  asked to follow this patient by consultation request of Sharilyn Sites, MD to help address advance care planning and goals of care. This is a follow up visit from 07/14/2020.  CODE STATUS: Full Code  PPS: 30%  HOSPICE ELIGIBILITY/DIAGNOSIS: TBD  PAST MEDICAL HISTORY:  Past Medical History:  Diagnosis Date  . Arthritis   . Depression   . Gait instability    walks with cane  . GERD (gastroesophageal reflux disease)   . History of blood transfusion   . Hyperlipemia   . Hypertension   . Knee pain   . Metabolic encephalopathy   . Mild cognitive impairment with memory loss   . Seasonal allergies   . Skin cancer   . Stroke ([place])   . TIA (transient ischemic attack)     SOCIAL HX:  Social History   Tobacco Use  . Smoking status: Never Smoker  . Smokeless tobacco: Never Used  Substance Use Topics  . Alcohol use: Not Currently    Comment: occ   FAMILY HX:  Family History  Problem Relation Age of Onset  . Lung cancer Mother   . Lung cancer Sister     ALLERGIES:  Allergies  Allergen Reactions  . Penicillins Anaphylaxis, Swelling and Rash    Oral rash/peeling  **Has tolerated Rocephin, Vantin since 2017**    . Adhesive [Tape] Itching and Rash    Irritation at site  . Codeine Hives and Rash  . Neosporin [Neomycin-Bacitracin Zn-Polymyx] Rash  . Sulfa Antibiotics Hives and Rash     PERTINENT MEDICATIONS:  Outpatient Encounter Medications as of 07/28/2020  Medication Sig  . acetaminophen (TYLENOL) 500 MG tablet Take 500 mg by mouth at bedtime. *May take one tablet daily as needed for pain*  . ALPRAZolam (XANAX) 0.5 MG tablet Take 1 tablet (0.5 mg total) by mouth at bedtime as needed for anxiety.  Marland Kitchen aspirin EC 81 MG tablet Take 81 mg by mouth every morning.   Marland Kitchen buPROPion (WELLBUTRIN) 75 MG tablet Take 75 mg by mouth 2 (two) times daily.  . calcium carbonate (TUMS - DOSED IN MG ELEMENTAL CALCIUM) 500 MG chewable tablet Chew 1 tablet by mouth daily as needed for  indigestion or heartburn.  . Cholecalciferol (VITAMIN D3 PO) Take 1 capsule by mouth daily at 12 noon.   . clopidogrel (PLAVIX) 75 MG tablet Take 1 tablet (75 mg total) by mouth daily. (Patient taking differently: Take 75 mg by mouth every morning. )  . Emollient (CERAVE EX) Apply 1 application topically daily as needed (for itching and dryness).  Marland Kitchen escitalopram (LEXAPRO) 20 MG tablet Take 20 mg by mouth at bedtime.   Marland Kitchen guaiFENesin-dextromethorphan (ROBITUSSIN DM) 100-10 MG/5ML syrup Take 2.5-5 mLs by mouth every 6 (six) hours as needed for cough.  . hydrocortisone cream 1 % Apply 1 application topically daily as needed for itching.  . Magnesium Oxide (MAG-200 PO) Take by mouth daily.  . metoprolol tartrate (LOPRESSOR) 50 MG tablet Take 25-50 mg by mouth See admin instructions. 40m in the morning and 226min the evening  . Multiple Vitamin (MULTIVITAMIN ADULT PO) Take 1 tablet by mouth daily at 12 noon.  . Marland Kitchenlmesartan (BENICAR) 20 MG tablet Take 20 mg by mouth in the morning and at bedtime.  . Omega-3 Fatty Acids (FISH OIL PO)  Take 1 capsule by mouth every evening.   . Omega-3 Krill Oil 500 MG CAPS Take 1 capsule by mouth every evening.   Marland Kitchen omeprazole (PRILOSEC) 20 MG capsule Take 20 mg by mouth 2 (two) times daily before a meal.   . potassium chloride (K-DUR) 10 MEQ tablet Take 1 tablet (10 mEq total) by mouth daily. (Patient taking differently: Take 10 mEq by mouth daily with supper. )  . pravastatin (PRAVACHOL) 20 MG tablet Take 20 mg by mouth at bedtime.   . Probiotic Product (PROBIOTIC-10 PO) Take 1 capsule by mouth daily. RENEW Probiotic  . triamcinolone cream (KENALOG) 0.1 % Apply 1 application topically daily as needed (for itching).  . TURMERIC PO Take 1 capsule by mouth daily at 12 noon.    No facility-administered encounter medications on file as of 07/28/2020.    PHYSICAL EXAM / ROS:   General: frail appearing, thin, sitting in wheelchair in NAD Cardiovascular: denied chest  pain, +1 edema  Pulmonary: no cough, no SOB, on supplemental oxygen at 2L. sats 93% Abdomen: appetite poor, incontinent of bowel GU:  incontinent of urine MSK:  non ambulatory Skin: dry and warm Neurological: Weakness, confused   Jari Favre, DNP, AGPCNP-BC

## 2020-08-03 DIAGNOSIS — S72001D Fracture of unspecified part of neck of right femur, subsequent encounter for closed fracture with routine healing: Secondary | ICD-10-CM | POA: Diagnosis not present

## 2020-08-03 DIAGNOSIS — T148XXA Other injury of unspecified body region, initial encounter: Secondary | ICD-10-CM | POA: Diagnosis not present

## 2020-08-04 ENCOUNTER — Other Ambulatory Visit: Payer: Self-pay | Admitting: *Deleted

## 2020-08-04 NOTE — Patient Outreach (Signed)
[facility] Post- Acute Care Coordinator follow up. Member screened for potential [facility] Care Management needs as a benefit of [employer].  Verified in Patient Ping that member resides in [facility].   Communication sent to SNF SW to inquire about transition plans and potential [facility] Care Management needs.   Will continue to follow while member resides in SNF.    Jacqlyn Marolf, MSN-Ed, RN,BSN [facility] Post Acute Care Coordinator [phone] ( Business Mobile) 

## 2020-08-10 ENCOUNTER — Other Ambulatory Visit: Payer: Self-pay | Admitting: *Deleted

## 2020-08-10 NOTE — Patient Outreach (Signed)
[place] Coordinator follow up. Member screened for potential [facility] Care Management needs as a benefit of [place] [employer].  Mrs. Harney is receiving skilled therapy at [facility]. Update received from SNF SW indicating member's family is in the process of finding Mrs. Freimark another facility to transfer to.  Will continue to follow for potential [facility] Care Management needs as appropriate.    Marthenia Rolling, MSN-Ed, RN,BSN [place] Acute Care Coordinator [phone] [facility]) [phone]  (Toll free office)

## 2020-08-19 DIAGNOSIS — S7291XA Unspecified fracture of right femur, initial encounter for closed fracture: Secondary | ICD-10-CM | POA: Diagnosis not present

## 2020-08-19 DIAGNOSIS — R5381 Other malaise: Secondary | ICD-10-CM | POA: Diagnosis not present

## 2020-08-19 DIAGNOSIS — I10 Essential (primary) hypertension: Secondary | ICD-10-CM | POA: Diagnosis not present

## 2020-08-19 DIAGNOSIS — S72141D Displaced intertrochanteric fracture of right femur, subsequent encounter for closed fracture with routine healing: Secondary | ICD-10-CM | POA: Diagnosis not present

## 2020-08-22 ENCOUNTER — Other Ambulatory Visit: Payer: Self-pay | Admitting: *Deleted

## 2020-08-22 DIAGNOSIS — S72141D Displaced intertrochanteric fracture of right femur, subsequent encounter for closed fracture with routine healing: Secondary | ICD-10-CM | POA: Diagnosis not present

## 2020-08-22 NOTE — Patient Outreach (Signed)
[facility] Post- Acute Care Coordinator follow up. Member screened for potential [facility] Care Management needs as a benefit of [employer].  Verified in Patient Ping that member resides in [facility].   Communication sent to SNF SW to inquire about transition plans and potential [facility] Care Management needs.   Will continue to follow while member resides in SNF.    Juanluis Guastella, MSN-Ed, RN,BSN [facility] Post Acute Care Coordinator [phone] ( Business [employer]) 

## 2020-08-23 DIAGNOSIS — S72141D Displaced intertrochanteric fracture of right femur, subsequent encounter for closed fracture with routine healing: Secondary | ICD-10-CM | POA: Diagnosis not present

## 2020-08-24 ENCOUNTER — Other Ambulatory Visit: Payer: Self-pay | Admitting: *Deleted

## 2020-08-24 DIAGNOSIS — F0150 Vascular dementia without behavioral disturbance: Secondary | ICD-10-CM | POA: Diagnosis not present

## 2020-08-24 DIAGNOSIS — S72141D Displaced intertrochanteric fracture of right femur, subsequent encounter for closed fracture with routine healing: Secondary | ICD-10-CM | POA: Diagnosis not present

## 2020-08-24 DIAGNOSIS — I10 Essential (primary) hypertension: Secondary | ICD-10-CM | POA: Diagnosis not present

## 2020-08-24 DIAGNOSIS — R400 Somnolence: Secondary | ICD-10-CM | POA: Diagnosis not present

## 2020-08-24 DIAGNOSIS — R600 Localized edema: Secondary | ICD-10-CM | POA: Diagnosis not present

## 2020-08-24 DIAGNOSIS — R5381 Other malaise: Secondary | ICD-10-CM | POA: Diagnosis not present

## 2020-08-24 NOTE — Patient Outreach (Addendum)
[facility] Post- Acute Care Coordinator follow up. Member screened for potential [facility] Care Management needs as a benefit of [place] Medicare.  [facility] SW reports member transitioned to [facility]. Verified in Patient Pear[place] that member resides in [place] under private pay.   No identifiable [facility] Care Management needs at this time.   Addendum: Notification sent to [facility] palliative to make aware of member's transfer to [facility].    Marthenia Rolling, MSN, RN,BSN [place] Acute Care Coordinator [phone] [facility]) [phone]  (Toll free office)

## 2020-08-25 DIAGNOSIS — S72141D Displaced intertrochanteric fracture of right femur, subsequent encounter for closed fracture with routine healing: Secondary | ICD-10-CM | POA: Diagnosis not present

## 2020-08-25 DIAGNOSIS — M6281 Muscle weakness (generalized): Secondary | ICD-10-CM | POA: Diagnosis not present

## 2020-08-25 DIAGNOSIS — G3183 Dementia with Lewy bodies: Secondary | ICD-10-CM | POA: Diagnosis not present

## 2020-08-25 DIAGNOSIS — E876 Hypokalemia: Secondary | ICD-10-CM | POA: Diagnosis not present

## 2020-08-25 DIAGNOSIS — E785 Hyperlipidemia, unspecified: Secondary | ICD-10-CM | POA: Diagnosis not present

## 2020-08-26 DIAGNOSIS — S72141D Displaced intertrochanteric fracture of right femur, subsequent encounter for closed fracture with routine healing: Secondary | ICD-10-CM | POA: Diagnosis not present

## 2020-08-27 DIAGNOSIS — S72141D Displaced intertrochanteric fracture of right femur, subsequent encounter for closed fracture with routine healing: Secondary | ICD-10-CM | POA: Diagnosis not present

## 2020-08-29 DIAGNOSIS — S72141D Displaced intertrochanteric fracture of right femur, subsequent encounter for closed fracture with routine healing: Secondary | ICD-10-CM | POA: Diagnosis not present

## 2020-08-30 DIAGNOSIS — S72141D Displaced intertrochanteric fracture of right femur, subsequent encounter for closed fracture with routine healing: Secondary | ICD-10-CM | POA: Diagnosis not present

## 2020-08-31 DIAGNOSIS — S72141D Displaced intertrochanteric fracture of right femur, subsequent encounter for closed fracture with routine healing: Secondary | ICD-10-CM | POA: Diagnosis not present

## 2020-09-02 DIAGNOSIS — S72141D Displaced intertrochanteric fracture of right femur, subsequent encounter for closed fracture with routine healing: Secondary | ICD-10-CM | POA: Diagnosis not present

## 2020-09-03 DIAGNOSIS — S72141D Displaced intertrochanteric fracture of right femur, subsequent encounter for closed fracture with routine healing: Secondary | ICD-10-CM | POA: Diagnosis not present

## 2020-09-05 DIAGNOSIS — S72141D Displaced intertrochanteric fracture of right femur, subsequent encounter for closed fracture with routine healing: Secondary | ICD-10-CM | POA: Diagnosis not present

## 2020-09-06 DIAGNOSIS — S72141D Displaced intertrochanteric fracture of right femur, subsequent encounter for closed fracture with routine healing: Secondary | ICD-10-CM | POA: Diagnosis not present

## 2020-09-07 ENCOUNTER — Other Ambulatory Visit: Payer: Self-pay

## 2020-09-07 ENCOUNTER — Non-Acute Institutional Stay: Payer: Medicare Other | Admitting: Internal Medicine

## 2020-09-08 DIAGNOSIS — M6281 Muscle weakness (generalized): Secondary | ICD-10-CM | POA: Diagnosis not present

## 2020-09-08 DIAGNOSIS — R41841 Cognitive communication deficit: Secondary | ICD-10-CM | POA: Diagnosis not present

## 2020-09-09 DIAGNOSIS — R41841 Cognitive communication deficit: Secondary | ICD-10-CM | POA: Diagnosis not present

## 2020-09-09 DIAGNOSIS — M6281 Muscle weakness (generalized): Secondary | ICD-10-CM | POA: Diagnosis not present

## 2020-09-12 DIAGNOSIS — R5381 Other malaise: Secondary | ICD-10-CM | POA: Diagnosis not present

## 2020-09-12 DIAGNOSIS — M6281 Muscle weakness (generalized): Secondary | ICD-10-CM | POA: Diagnosis not present

## 2020-09-12 DIAGNOSIS — H109 Unspecified conjunctivitis: Secondary | ICD-10-CM | POA: Diagnosis not present

## 2020-09-12 DIAGNOSIS — R600 Localized edema: Secondary | ICD-10-CM | POA: Diagnosis not present

## 2020-09-12 DIAGNOSIS — R41841 Cognitive communication deficit: Secondary | ICD-10-CM | POA: Diagnosis not present

## 2020-09-12 DIAGNOSIS — I10 Essential (primary) hypertension: Secondary | ICD-10-CM | POA: Diagnosis not present

## 2020-09-13 DIAGNOSIS — M6281 Muscle weakness (generalized): Secondary | ICD-10-CM | POA: Diagnosis not present

## 2020-09-13 DIAGNOSIS — R41841 Cognitive communication deficit: Secondary | ICD-10-CM | POA: Diagnosis not present

## 2020-09-14 DIAGNOSIS — R41841 Cognitive communication deficit: Secondary | ICD-10-CM | POA: Diagnosis not present

## 2020-09-14 DIAGNOSIS — M6281 Muscle weakness (generalized): Secondary | ICD-10-CM | POA: Diagnosis not present

## 2020-09-15 DIAGNOSIS — E785 Hyperlipidemia, unspecified: Secondary | ICD-10-CM | POA: Diagnosis not present

## 2020-09-15 DIAGNOSIS — E559 Vitamin D deficiency, unspecified: Secondary | ICD-10-CM | POA: Diagnosis not present

## 2020-09-15 DIAGNOSIS — R600 Localized edema: Secondary | ICD-10-CM | POA: Diagnosis not present

## 2020-09-15 DIAGNOSIS — R41841 Cognitive communication deficit: Secondary | ICD-10-CM | POA: Diagnosis not present

## 2020-09-15 DIAGNOSIS — M6281 Muscle weakness (generalized): Secondary | ICD-10-CM | POA: Diagnosis not present

## 2020-09-15 DIAGNOSIS — K219 Gastro-esophageal reflux disease without esophagitis: Secondary | ICD-10-CM | POA: Diagnosis not present

## 2020-09-15 DIAGNOSIS — I10 Essential (primary) hypertension: Secondary | ICD-10-CM | POA: Diagnosis not present

## 2020-09-15 DIAGNOSIS — H109 Unspecified conjunctivitis: Secondary | ICD-10-CM | POA: Diagnosis not present

## 2020-09-15 DIAGNOSIS — R5381 Other malaise: Secondary | ICD-10-CM | POA: Diagnosis not present

## 2020-09-16 DIAGNOSIS — M6281 Muscle weakness (generalized): Secondary | ICD-10-CM | POA: Diagnosis not present

## 2020-09-16 DIAGNOSIS — R41841 Cognitive communication deficit: Secondary | ICD-10-CM | POA: Diagnosis not present

## 2020-09-17 DIAGNOSIS — R41841 Cognitive communication deficit: Secondary | ICD-10-CM | POA: Diagnosis not present

## 2020-09-17 DIAGNOSIS — M6281 Muscle weakness (generalized): Secondary | ICD-10-CM | POA: Diagnosis not present

## 2020-09-19 DIAGNOSIS — R41841 Cognitive communication deficit: Secondary | ICD-10-CM | POA: Diagnosis not present

## 2020-09-19 DIAGNOSIS — M6281 Muscle weakness (generalized): Secondary | ICD-10-CM | POA: Diagnosis not present

## 2020-09-20 DIAGNOSIS — M6281 Muscle weakness (generalized): Secondary | ICD-10-CM | POA: Diagnosis not present

## 2020-09-20 DIAGNOSIS — R41841 Cognitive communication deficit: Secondary | ICD-10-CM | POA: Diagnosis not present

## 2020-09-20 DIAGNOSIS — B351 Tinea unguium: Secondary | ICD-10-CM | POA: Diagnosis not present

## 2020-09-21 DIAGNOSIS — M6281 Muscle weakness (generalized): Secondary | ICD-10-CM | POA: Diagnosis not present

## 2020-09-21 DIAGNOSIS — R41841 Cognitive communication deficit: Secondary | ICD-10-CM | POA: Diagnosis not present

## 2020-09-22 DIAGNOSIS — R41841 Cognitive communication deficit: Secondary | ICD-10-CM | POA: Diagnosis not present

## 2020-09-22 DIAGNOSIS — M6281 Muscle weakness (generalized): Secondary | ICD-10-CM | POA: Diagnosis not present

## 2020-09-23 DIAGNOSIS — R41841 Cognitive communication deficit: Secondary | ICD-10-CM | POA: Diagnosis not present

## 2020-09-23 DIAGNOSIS — M6281 Muscle weakness (generalized): Secondary | ICD-10-CM | POA: Diagnosis not present

## 2020-09-24 DIAGNOSIS — E559 Vitamin D deficiency, unspecified: Secondary | ICD-10-CM | POA: Diagnosis not present

## 2020-09-24 DIAGNOSIS — R7989 Other specified abnormal findings of blood chemistry: Secondary | ICD-10-CM | POA: Diagnosis not present

## 2020-09-25 DIAGNOSIS — R41841 Cognitive communication deficit: Secondary | ICD-10-CM | POA: Diagnosis not present

## 2020-09-25 DIAGNOSIS — M6281 Muscle weakness (generalized): Secondary | ICD-10-CM | POA: Diagnosis not present

## 2020-09-26 DIAGNOSIS — R41841 Cognitive communication deficit: Secondary | ICD-10-CM | POA: Diagnosis not present

## 2020-09-26 DIAGNOSIS — M6281 Muscle weakness (generalized): Secondary | ICD-10-CM | POA: Diagnosis not present

## 2020-09-27 DIAGNOSIS — M6281 Muscle weakness (generalized): Secondary | ICD-10-CM | POA: Diagnosis not present

## 2020-09-27 DIAGNOSIS — R41841 Cognitive communication deficit: Secondary | ICD-10-CM | POA: Diagnosis not present

## 2020-09-28 DIAGNOSIS — R41841 Cognitive communication deficit: Secondary | ICD-10-CM | POA: Diagnosis not present

## 2020-09-28 DIAGNOSIS — M6281 Muscle weakness (generalized): Secondary | ICD-10-CM | POA: Diagnosis not present

## 2020-09-29 DIAGNOSIS — M6281 Muscle weakness (generalized): Secondary | ICD-10-CM | POA: Diagnosis not present

## 2020-09-29 DIAGNOSIS — R41841 Cognitive communication deficit: Secondary | ICD-10-CM | POA: Diagnosis not present

## 2020-10-03 DIAGNOSIS — M6281 Muscle weakness (generalized): Secondary | ICD-10-CM | POA: Diagnosis not present

## 2020-10-03 DIAGNOSIS — R41841 Cognitive communication deficit: Secondary | ICD-10-CM | POA: Diagnosis not present

## 2020-10-04 DIAGNOSIS — R41841 Cognitive communication deficit: Secondary | ICD-10-CM | POA: Diagnosis not present

## 2020-10-04 DIAGNOSIS — M6281 Muscle weakness (generalized): Secondary | ICD-10-CM | POA: Diagnosis not present

## 2020-10-05 DIAGNOSIS — R41841 Cognitive communication deficit: Secondary | ICD-10-CM | POA: Diagnosis not present

## 2020-10-05 DIAGNOSIS — M6281 Muscle weakness (generalized): Secondary | ICD-10-CM | POA: Diagnosis not present

## 2020-10-06 DIAGNOSIS — M6281 Muscle weakness (generalized): Secondary | ICD-10-CM | POA: Diagnosis not present

## 2020-10-06 DIAGNOSIS — R41841 Cognitive communication deficit: Secondary | ICD-10-CM | POA: Diagnosis not present

## 2020-10-07 DIAGNOSIS — M6281 Muscle weakness (generalized): Secondary | ICD-10-CM | POA: Diagnosis not present

## 2020-10-07 DIAGNOSIS — R41841 Cognitive communication deficit: Secondary | ICD-10-CM | POA: Diagnosis not present

## 2020-10-09 DIAGNOSIS — R41841 Cognitive communication deficit: Secondary | ICD-10-CM | POA: Diagnosis not present

## 2020-10-10 DIAGNOSIS — R41841 Cognitive communication deficit: Secondary | ICD-10-CM | POA: Diagnosis not present

## 2020-10-11 DIAGNOSIS — R41841 Cognitive communication deficit: Secondary | ICD-10-CM | POA: Diagnosis not present

## 2020-10-13 DIAGNOSIS — N390 Urinary tract infection, site not specified: Secondary | ICD-10-CM | POA: Diagnosis not present

## 2020-10-13 DIAGNOSIS — R41841 Cognitive communication deficit: Secondary | ICD-10-CM | POA: Diagnosis not present

## 2020-10-14 DIAGNOSIS — R41841 Cognitive communication deficit: Secondary | ICD-10-CM | POA: Diagnosis not present

## 2020-10-20 DIAGNOSIS — F0150 Vascular dementia without behavioral disturbance: Secondary | ICD-10-CM | POA: Diagnosis not present

## 2020-10-20 DIAGNOSIS — E785 Hyperlipidemia, unspecified: Secondary | ICD-10-CM | POA: Diagnosis not present

## 2020-10-20 DIAGNOSIS — I10 Essential (primary) hypertension: Secondary | ICD-10-CM | POA: Diagnosis not present

## 2020-10-20 DIAGNOSIS — R5381 Other malaise: Secondary | ICD-10-CM | POA: Diagnosis not present

## 2020-10-20 DIAGNOSIS — R197 Diarrhea, unspecified: Secondary | ICD-10-CM | POA: Diagnosis not present

## 2020-10-20 DIAGNOSIS — K219 Gastro-esophageal reflux disease without esophagitis: Secondary | ICD-10-CM | POA: Diagnosis not present

## 2020-10-20 DIAGNOSIS — R300 Dysuria: Secondary | ICD-10-CM | POA: Diagnosis not present

## 2020-10-20 DIAGNOSIS — B372 Candidiasis of skin and nail: Secondary | ICD-10-CM | POA: Diagnosis not present

## 2020-11-02 DIAGNOSIS — E785 Hyperlipidemia, unspecified: Secondary | ICD-10-CM | POA: Diagnosis not present

## 2020-11-02 DIAGNOSIS — E559 Vitamin D deficiency, unspecified: Secondary | ICD-10-CM | POA: Diagnosis not present

## 2020-11-07 DIAGNOSIS — R300 Dysuria: Secondary | ICD-10-CM | POA: Diagnosis not present

## 2020-11-07 DIAGNOSIS — K5900 Constipation, unspecified: Secondary | ICD-10-CM | POA: Diagnosis not present

## 2020-11-07 DIAGNOSIS — K219 Gastro-esophageal reflux disease without esophagitis: Secondary | ICD-10-CM | POA: Diagnosis not present

## 2020-11-07 DIAGNOSIS — I10 Essential (primary) hypertension: Secondary | ICD-10-CM | POA: Diagnosis not present

## 2020-11-07 DIAGNOSIS — E785 Hyperlipidemia, unspecified: Secondary | ICD-10-CM | POA: Diagnosis not present

## 2020-11-07 DIAGNOSIS — F0150 Vascular dementia without behavioral disturbance: Secondary | ICD-10-CM | POA: Diagnosis not present

## 2020-11-09 ENCOUNTER — Ambulatory Visit: Payer: Medicare Other | Admitting: Neurology

## 2020-11-14 DIAGNOSIS — I10 Essential (primary) hypertension: Secondary | ICD-10-CM | POA: Diagnosis not present

## 2020-11-14 DIAGNOSIS — E785 Hyperlipidemia, unspecified: Secondary | ICD-10-CM | POA: Diagnosis not present

## 2020-11-14 DIAGNOSIS — K5900 Constipation, unspecified: Secondary | ICD-10-CM | POA: Diagnosis not present

## 2020-11-14 DIAGNOSIS — E559 Vitamin D deficiency, unspecified: Secondary | ICD-10-CM | POA: Diagnosis not present

## 2020-11-14 DIAGNOSIS — K219 Gastro-esophageal reflux disease without esophagitis: Secondary | ICD-10-CM | POA: Diagnosis not present

## 2020-11-17 DIAGNOSIS — E559 Vitamin D deficiency, unspecified: Secondary | ICD-10-CM | POA: Diagnosis not present

## 2020-11-17 DIAGNOSIS — F419 Anxiety disorder, unspecified: Secondary | ICD-10-CM | POA: Diagnosis not present

## 2020-11-17 DIAGNOSIS — F0150 Vascular dementia without behavioral disturbance: Secondary | ICD-10-CM | POA: Diagnosis not present

## 2020-11-17 DIAGNOSIS — K219 Gastro-esophageal reflux disease without esophagitis: Secondary | ICD-10-CM | POA: Diagnosis not present

## 2020-11-17 DIAGNOSIS — E785 Hyperlipidemia, unspecified: Secondary | ICD-10-CM | POA: Diagnosis not present

## 2020-11-17 DIAGNOSIS — K5900 Constipation, unspecified: Secondary | ICD-10-CM | POA: Diagnosis not present

## 2020-11-17 DIAGNOSIS — I10 Essential (primary) hypertension: Secondary | ICD-10-CM | POA: Diagnosis not present

## 2020-11-21 ENCOUNTER — Non-Acute Institutional Stay: Payer: Medicare Other | Admitting: Nurse Practitioner

## 2020-11-21 ENCOUNTER — Other Ambulatory Visit: Payer: Self-pay

## 2020-11-21 DIAGNOSIS — Z515 Encounter for palliative care: Secondary | ICD-10-CM | POA: Diagnosis not present

## 2020-11-21 NOTE — Progress Notes (Signed)
[facility] Palliative Care Consult Note Telephone: [phone]  Fax: [phone]  PATIENT NAME: Claire West [place] [address] [phone] (home)  DOB: 03/05/1941 MRN: [id number]  PRIMARY CARE PROVIDER:    Sharilyn Sites, MD,  [address] [place] [address] [phone]  REFERRING PROVIDER:   Sharilyn West, [place],  [place] [address] [phone]  RESPONSIBLE PARTY:   Extended Emergency Contact Information Primary Emergency Contact: Claire West, [place] [address] Claire West of [place] Phone: [phone] Mobile Phone: [phone] Relation: Sister Secondary Emergency Contact: Claire West Mobile Phone: [phone] Relation: Son  I met face to face with patient in facility  ASSESSMENT AND RECOMMENDATIONS:   Advance Care Planning: ACP discussion held over the phone with patient's son Claire West. Claire West verbalized awareness of the need for code status clarification. Claire West said he is still waiting for response from patient's sister. He declined offer to discuss with patient's sister. He desires for patient code status to remain full code while he discuss it again with patient's sister. Claire West report patient's goal of care is comfort, desires for patient to be comfortable as he lives out the rest of her life.  Symptom Management:  Pain: No report of uncontrolled pain. Patient pain medication regimen include Hydrocodone-Acetaminophen 5-397m every 12hrs as needed, Ibuprofen 6058mevery 6hrs as needed, and Tylenol 50068mvery 6hrs as needed for pain.  Recommendation: continue current plan of care, may consider scheduling Tylenol 500m65mD or TID is worsening pain. Continue bowel regimen with Miralax . Patient with 2lbs weight increased from last palliative care visit, BMI 22.8kg/m2 today. Facility report stable weight. Patient does not appear acutely ill on exam today. Nursing staff and  patient's son voiced no concerns for patient. No report of behavior issues, no report of fever or chills. Provided general support and encouragement, no other unmet needs identified at this time. Palliative care will continue to provide support to patient, family and the medical team.   Follow up Palliative Care Visit: Palliative care will continue to follow for goals of care clarification and symptom management. Return in about 6-8 weeks or prn.  Family /Caregiver/Community Supports: [facility]. Her son is her POA and very involved in her care.  Cognitive / Functional decline: Patient awake and alert, she is however confused and unable to make her own decisions. She is totally dependent on staff for all of her ADLs, requires assistance during meals, requires cueing. She is non-ambulatory, wheelchair dependent.   I spent 48 minutes providing this consultation, time includes time spent with patient/family, chart review, provider coordination, and documentation. More than 50% of the time in this consultation was spent counseling and coordinating communication.   CHIEF COMPLAINT: Follow up palliative care visit  History obtained from review of EMR, interview with facility staff, and discussion with family. Records reviewed and summarized bellow.  HISTORY OF PRESENT ILLNESS: Claire Espirituchalis a 79 y89.year old femalewith multiple medical problems including Arthritis, gait instability (wheelchair dependent), cognitive impairment with memory loss, depression, hx of CVA. Patient is now a resident of long term facility.Palliative Care was asked to help address advance care planning and goals of care. This is afollow upvisit from 07/28/2020.  CODE STATUS: Full Code  PPS: 30%  HOSPICE ELIGIBILITY/DIAGNOSIS: TBD  PHYSICAL EXAM / ROS:   Current and past weights: 137lbs up from 135lbs 5 months ago, Ht 24f17f  5", BMI 22.8kg/m2 General: frail  appearing, thin, lying in bed in NAD Cardiovascular:no chest pain reported, no edema  Pulmonary: no cough, no SOB,room air Abdomen: appetite fair, incontinent of bowel GU: no report of dysuria, incontinent of urine MSK: non ambulatory Skin:dry and warm Neurological: Weakness, confused Psych: non -anxious affect  PAST MEDICAL HISTORY:  Past Medical History:  Diagnosis Date  . Arthritis   . Depression   . Gait instability    walks with cane  . GERD (gastroesophageal reflux disease)   . History of blood transfusion   . Hyperlipemia   . Hypertension   . Knee pain   . Metabolic encephalopathy   . Mild cognitive impairment with memory loss   . Seasonal allergies   . Skin cancer   . Stroke ([place])   . TIA (transient ischemic attack)     SOCIAL HX:  Social History   Tobacco Use  . Smoking status: Never Smoker  . Smokeless tobacco: Never Used  Substance Use Topics  . Alcohol use: Not Currently    Comment: occ   FAMILY HX:  Family History  Problem Relation Age of Onset  . Lung cancer Mother   . Lung cancer Sister     ALLERGIES:  Allergies  Allergen Reactions  . Penicillins Anaphylaxis, Swelling and Rash    Oral rash/peeling  **Has tolerated Rocephin, Vantin since 2017**    . Adhesive [Tape] Itching and Rash    Irritation at site  . Codeine Hives and Rash  . Neosporin [Neomycin-Bacitracin Zn-Polymyx] Rash  . Sulfa Antibiotics Hives and Rash     PERTINENT MEDICATIONS:  Outpatient Encounter Medications as of 11/21/2020  Medication Sig  . acetaminophen (TYLENOL) 500 MG tablet Take 500 mg by mouth at bedtime. *May take one tablet daily as needed for pain*  . ALPRAZolam (XANAX) 0.5 MG tablet Take 1 tablet (0.5 mg total) by mouth at bedtime as needed for anxiety.  Marland Kitchen aspirin EC 81 MG tablet Take 81 mg by mouth every morning.   Marland Kitchen buPROPion (WELLBUTRIN) 75 MG tablet Take 75 mg by mouth 2 (two) times daily.  . calcium carbonate (TUMS - DOSED IN MG ELEMENTAL CALCIUM) 500  MG chewable tablet Chew 1 tablet by mouth daily as needed for indigestion or heartburn.  . Cholecalciferol (VITAMIN D3 PO) Take 1 capsule by mouth daily at 12 noon.   . clopidogrel (PLAVIX) 75 MG tablet Take 1 tablet (75 mg total) by mouth daily. (Patient taking differently: Take 75 mg by mouth every morning. )  . Emollient (CERAVE EX) Apply 1 application topically daily as needed (for itching and dryness).  Marland Kitchen escitalopram (LEXAPRO) 20 MG tablet Take 20 mg by mouth at bedtime.   Marland Kitchen guaiFENesin-dextromethorphan (ROBITUSSIN DM) 100-10 MG/5ML syrup Take 2.5-5 mLs by mouth every 6 (six) hours as needed for cough.  . hydrocortisone cream 1 % Apply 1 application topically daily as needed for itching.  . Magnesium Oxide (MAG-200 PO) Take by mouth daily.  . metoprolol tartrate (LOPRESSOR) 50 MG tablet Take 25-50 mg by mouth See admin instructions. 50mg  in the morning and 25mg  in the evening  . Multiple Vitamin (MULTIVITAMIN ADULT PO) Take 1 tablet by mouth daily at 12 noon.  Marland Kitchen olmesartan (BENICAR) 20 MG tablet Take 20 mg by mouth in the morning and at bedtime.  . Omega-3 Fatty Acids (FISH OIL PO) Take 1 capsule by mouth every evening.   . Omega-3 Krill Oil 500 MG CAPS Take 1 capsule by mouth every evening.   Marland Kitchen  omeprazole (PRILOSEC) 20 MG capsule Take 20 mg by mouth 2 (two) times daily before a meal.   . potassium chloride (K-DUR) 10 MEQ tablet Take 1 tablet (10 mEq total) by mouth daily. (Patient taking differently: Take 10 mEq by mouth daily with supper. )  . pravastatin (PRAVACHOL) 20 MG tablet Take 20 mg by mouth at bedtime.   . Probiotic Product (PROBIOTIC-10 PO) Take 1 capsule by mouth daily. RENEW Probiotic  . triamcinolone cream (KENALOG) 0.1 % Apply 1 application topically daily as needed (for itching).  . TURMERIC PO Take 1 capsule by mouth daily at 12 noon.    No facility-administered encounter medications on file as of 11/21/2020.    Thank you for the opportunity to participate in the care of  Ms. Claire West. The palliative care team will continue to follow. Please call our office at [phone] if we can be of additional assistance.  Jari Favre, DNP, AGPCNP-BC

## 2020-11-28 ENCOUNTER — Ambulatory Visit: Payer: Medicare Other | Admitting: Cardiology

## 2020-11-29 DIAGNOSIS — E039 Hypothyroidism, unspecified: Secondary | ICD-10-CM | POA: Diagnosis not present

## 2020-11-29 DIAGNOSIS — G3183 Dementia with Lewy bodies: Secondary | ICD-10-CM | POA: Diagnosis not present

## 2020-11-29 DIAGNOSIS — N390 Urinary tract infection, site not specified: Secondary | ICD-10-CM | POA: Diagnosis not present

## 2020-12-01 ENCOUNTER — Encounter (INDEPENDENT_AMBULATORY_CARE_PROVIDER_SITE_OTHER): Payer: Self-pay | Admitting: *Deleted

## 2020-12-04 DIAGNOSIS — F0150 Vascular dementia without behavioral disturbance: Secondary | ICD-10-CM | POA: Diagnosis not present

## 2020-12-04 DIAGNOSIS — I10 Essential (primary) hypertension: Secondary | ICD-10-CM | POA: Diagnosis not present

## 2020-12-04 DIAGNOSIS — E785 Hyperlipidemia, unspecified: Secondary | ICD-10-CM | POA: Diagnosis not present

## 2020-12-04 DIAGNOSIS — E559 Vitamin D deficiency, unspecified: Secondary | ICD-10-CM | POA: Diagnosis not present

## 2020-12-04 DIAGNOSIS — F329 Major depressive disorder, single episode, unspecified: Secondary | ICD-10-CM | POA: Diagnosis not present

## 2020-12-04 DIAGNOSIS — I63531 Cerebral infarction due to unspecified occlusion or stenosis of right posterior cerebral artery: Secondary | ICD-10-CM | POA: Diagnosis not present

## 2020-12-05 DIAGNOSIS — E559 Vitamin D deficiency, unspecified: Secondary | ICD-10-CM | POA: Diagnosis not present

## 2020-12-05 DIAGNOSIS — I10 Essential (primary) hypertension: Secondary | ICD-10-CM | POA: Diagnosis not present

## 2020-12-05 DIAGNOSIS — K219 Gastro-esophageal reflux disease without esophagitis: Secondary | ICD-10-CM | POA: Diagnosis not present

## 2020-12-05 DIAGNOSIS — E785 Hyperlipidemia, unspecified: Secondary | ICD-10-CM | POA: Diagnosis not present

## 2020-12-05 DIAGNOSIS — K5900 Constipation, unspecified: Secondary | ICD-10-CM | POA: Diagnosis not present

## 2020-12-05 DIAGNOSIS — F0150 Vascular dementia without behavioral disturbance: Secondary | ICD-10-CM | POA: Diagnosis not present

## 2020-12-06 DIAGNOSIS — E785 Hyperlipidemia, unspecified: Secondary | ICD-10-CM | POA: Diagnosis not present

## 2020-12-06 DIAGNOSIS — I10 Essential (primary) hypertension: Secondary | ICD-10-CM | POA: Diagnosis not present

## 2020-12-06 DIAGNOSIS — E559 Vitamin D deficiency, unspecified: Secondary | ICD-10-CM | POA: Diagnosis not present

## 2020-12-06 DIAGNOSIS — I63531 Cerebral infarction due to unspecified occlusion or stenosis of right posterior cerebral artery: Secondary | ICD-10-CM | POA: Diagnosis not present

## 2020-12-06 DIAGNOSIS — F329 Major depressive disorder, single episode, unspecified: Secondary | ICD-10-CM | POA: Diagnosis not present

## 2020-12-06 DIAGNOSIS — F0150 Vascular dementia without behavioral disturbance: Secondary | ICD-10-CM | POA: Diagnosis not present

## 2020-12-13 DIAGNOSIS — R278 Other lack of coordination: Secondary | ICD-10-CM | POA: Diagnosis not present

## 2020-12-15 DIAGNOSIS — F419 Anxiety disorder, unspecified: Secondary | ICD-10-CM | POA: Diagnosis not present

## 2020-12-15 DIAGNOSIS — I10 Essential (primary) hypertension: Secondary | ICD-10-CM | POA: Diagnosis not present

## 2020-12-15 DIAGNOSIS — E559 Vitamin D deficiency, unspecified: Secondary | ICD-10-CM | POA: Diagnosis not present

## 2020-12-15 DIAGNOSIS — R278 Other lack of coordination: Secondary | ICD-10-CM | POA: Diagnosis not present

## 2020-12-15 DIAGNOSIS — K5900 Constipation, unspecified: Secondary | ICD-10-CM | POA: Diagnosis not present

## 2020-12-15 DIAGNOSIS — K219 Gastro-esophageal reflux disease without esophagitis: Secondary | ICD-10-CM | POA: Diagnosis not present

## 2020-12-15 DIAGNOSIS — F0150 Vascular dementia without behavioral disturbance: Secondary | ICD-10-CM | POA: Diagnosis not present

## 2020-12-15 DIAGNOSIS — E785 Hyperlipidemia, unspecified: Secondary | ICD-10-CM | POA: Diagnosis not present

## 2020-12-16 DIAGNOSIS — R278 Other lack of coordination: Secondary | ICD-10-CM | POA: Diagnosis not present

## 2020-12-17 DIAGNOSIS — R278 Other lack of coordination: Secondary | ICD-10-CM | POA: Diagnosis not present

## 2020-12-20 DIAGNOSIS — R278 Other lack of coordination: Secondary | ICD-10-CM | POA: Diagnosis not present

## 2020-12-22 DIAGNOSIS — R278 Other lack of coordination: Secondary | ICD-10-CM | POA: Diagnosis not present

## 2020-12-23 DIAGNOSIS — R278 Other lack of coordination: Secondary | ICD-10-CM | POA: Diagnosis not present

## 2020-12-24 DIAGNOSIS — R278 Other lack of coordination: Secondary | ICD-10-CM | POA: Diagnosis not present

## 2020-12-26 DIAGNOSIS — R278 Other lack of coordination: Secondary | ICD-10-CM | POA: Diagnosis not present

## 2020-12-27 DIAGNOSIS — R278 Other lack of coordination: Secondary | ICD-10-CM | POA: Diagnosis not present

## 2020-12-28 DIAGNOSIS — R278 Other lack of coordination: Secondary | ICD-10-CM | POA: Diagnosis not present

## 2020-12-29 DIAGNOSIS — R278 Other lack of coordination: Secondary | ICD-10-CM | POA: Diagnosis not present

## 2020-12-31 DIAGNOSIS — R278 Other lack of coordination: Secondary | ICD-10-CM | POA: Diagnosis not present

## 2021-01-02 DIAGNOSIS — R278 Other lack of coordination: Secondary | ICD-10-CM | POA: Diagnosis not present

## 2021-01-03 DIAGNOSIS — R278 Other lack of coordination: Secondary | ICD-10-CM | POA: Diagnosis not present

## 2021-01-04 DIAGNOSIS — K219 Gastro-esophageal reflux disease without esophagitis: Secondary | ICD-10-CM | POA: Diagnosis not present

## 2021-01-04 DIAGNOSIS — R278 Other lack of coordination: Secondary | ICD-10-CM | POA: Diagnosis not present

## 2021-01-04 DIAGNOSIS — E785 Hyperlipidemia, unspecified: Secondary | ICD-10-CM | POA: Diagnosis not present

## 2021-01-04 DIAGNOSIS — F0150 Vascular dementia without behavioral disturbance: Secondary | ICD-10-CM | POA: Diagnosis not present

## 2021-01-04 DIAGNOSIS — K5900 Constipation, unspecified: Secondary | ICD-10-CM | POA: Diagnosis not present

## 2021-01-04 DIAGNOSIS — I10 Essential (primary) hypertension: Secondary | ICD-10-CM | POA: Diagnosis not present

## 2021-01-04 DIAGNOSIS — E559 Vitamin D deficiency, unspecified: Secondary | ICD-10-CM | POA: Diagnosis not present

## 2021-01-05 DIAGNOSIS — R278 Other lack of coordination: Secondary | ICD-10-CM | POA: Diagnosis not present

## 2021-01-06 DIAGNOSIS — R278 Other lack of coordination: Secondary | ICD-10-CM | POA: Diagnosis not present

## 2021-01-07 DIAGNOSIS — R278 Other lack of coordination: Secondary | ICD-10-CM | POA: Diagnosis not present

## 2021-01-09 DIAGNOSIS — R278 Other lack of coordination: Secondary | ICD-10-CM | POA: Diagnosis not present

## 2021-01-10 DIAGNOSIS — R278 Other lack of coordination: Secondary | ICD-10-CM | POA: Diagnosis not present

## 2021-01-11 DIAGNOSIS — R278 Other lack of coordination: Secondary | ICD-10-CM | POA: Diagnosis not present

## 2021-01-12 DIAGNOSIS — F0150 Vascular dementia without behavioral disturbance: Secondary | ICD-10-CM | POA: Diagnosis not present

## 2021-01-12 DIAGNOSIS — I10 Essential (primary) hypertension: Secondary | ICD-10-CM | POA: Diagnosis not present

## 2021-01-12 DIAGNOSIS — R278 Other lack of coordination: Secondary | ICD-10-CM | POA: Diagnosis not present

## 2021-01-12 DIAGNOSIS — E785 Hyperlipidemia, unspecified: Secondary | ICD-10-CM | POA: Diagnosis not present

## 2021-01-12 DIAGNOSIS — K5900 Constipation, unspecified: Secondary | ICD-10-CM | POA: Diagnosis not present

## 2021-01-12 DIAGNOSIS — E559 Vitamin D deficiency, unspecified: Secondary | ICD-10-CM | POA: Diagnosis not present

## 2021-01-12 DIAGNOSIS — K219 Gastro-esophageal reflux disease without esophagitis: Secondary | ICD-10-CM | POA: Diagnosis not present

## 2021-01-13 DIAGNOSIS — R278 Other lack of coordination: Secondary | ICD-10-CM | POA: Diagnosis not present

## 2021-01-14 DIAGNOSIS — R278 Other lack of coordination: Secondary | ICD-10-CM | POA: Diagnosis not present

## 2021-01-17 DIAGNOSIS — R278 Other lack of coordination: Secondary | ICD-10-CM | POA: Diagnosis not present

## 2021-01-18 DIAGNOSIS — R278 Other lack of coordination: Secondary | ICD-10-CM | POA: Diagnosis not present

## 2021-01-19 DIAGNOSIS — R278 Other lack of coordination: Secondary | ICD-10-CM | POA: Diagnosis not present

## 2021-01-20 DIAGNOSIS — R278 Other lack of coordination: Secondary | ICD-10-CM | POA: Diagnosis not present

## 2021-01-23 DIAGNOSIS — R278 Other lack of coordination: Secondary | ICD-10-CM | POA: Diagnosis not present

## 2021-01-24 DIAGNOSIS — R278 Other lack of coordination: Secondary | ICD-10-CM | POA: Diagnosis not present

## 2021-01-25 DIAGNOSIS — R278 Other lack of coordination: Secondary | ICD-10-CM | POA: Diagnosis not present

## 2021-01-26 DIAGNOSIS — R278 Other lack of coordination: Secondary | ICD-10-CM | POA: Diagnosis not present

## 2021-01-30 DIAGNOSIS — R278 Other lack of coordination: Secondary | ICD-10-CM | POA: Diagnosis not present

## 2021-01-31 DIAGNOSIS — F0150 Vascular dementia without behavioral disturbance: Secondary | ICD-10-CM | POA: Diagnosis not present

## 2021-01-31 DIAGNOSIS — E559 Vitamin D deficiency, unspecified: Secondary | ICD-10-CM | POA: Diagnosis not present

## 2021-01-31 DIAGNOSIS — I10 Essential (primary) hypertension: Secondary | ICD-10-CM | POA: Diagnosis not present

## 2021-01-31 DIAGNOSIS — R278 Other lack of coordination: Secondary | ICD-10-CM | POA: Diagnosis not present

## 2021-01-31 DIAGNOSIS — F329 Major depressive disorder, single episode, unspecified: Secondary | ICD-10-CM | POA: Diagnosis not present

## 2021-01-31 DIAGNOSIS — I63531 Cerebral infarction due to unspecified occlusion or stenosis of right posterior cerebral artery: Secondary | ICD-10-CM | POA: Diagnosis not present

## 2021-01-31 DIAGNOSIS — E785 Hyperlipidemia, unspecified: Secondary | ICD-10-CM | POA: Diagnosis not present

## 2021-02-02 DIAGNOSIS — R278 Other lack of coordination: Secondary | ICD-10-CM | POA: Diagnosis not present

## 2021-02-03 DIAGNOSIS — R278 Other lack of coordination: Secondary | ICD-10-CM | POA: Diagnosis not present

## 2021-02-04 DIAGNOSIS — R278 Other lack of coordination: Secondary | ICD-10-CM | POA: Diagnosis not present

## 2021-02-06 DIAGNOSIS — Z8673 Personal history of transient ischemic attack (TIA), and cerebral infarction without residual deficits: Secondary | ICD-10-CM | POA: Diagnosis not present

## 2021-02-06 DIAGNOSIS — F0150 Vascular dementia without behavioral disturbance: Secondary | ICD-10-CM | POA: Diagnosis not present

## 2021-02-06 DIAGNOSIS — E559 Vitamin D deficiency, unspecified: Secondary | ICD-10-CM | POA: Diagnosis not present

## 2021-02-06 DIAGNOSIS — R278 Other lack of coordination: Secondary | ICD-10-CM | POA: Diagnosis not present

## 2021-02-06 DIAGNOSIS — Z9181 History of falling: Secondary | ICD-10-CM | POA: Diagnosis not present

## 2021-02-06 DIAGNOSIS — E785 Hyperlipidemia, unspecified: Secondary | ICD-10-CM | POA: Diagnosis not present

## 2021-02-06 DIAGNOSIS — K5900 Constipation, unspecified: Secondary | ICD-10-CM | POA: Diagnosis not present

## 2021-02-06 DIAGNOSIS — M179 Osteoarthritis of knee, unspecified: Secondary | ICD-10-CM | POA: Diagnosis not present

## 2021-02-06 DIAGNOSIS — F419 Anxiety disorder, unspecified: Secondary | ICD-10-CM | POA: Diagnosis not present

## 2021-02-06 DIAGNOSIS — I10 Essential (primary) hypertension: Secondary | ICD-10-CM | POA: Diagnosis not present

## 2021-02-06 DIAGNOSIS — K219 Gastro-esophageal reflux disease without esophagitis: Secondary | ICD-10-CM | POA: Diagnosis not present

## 2021-02-08 DIAGNOSIS — M179 Osteoarthritis of knee, unspecified: Secondary | ICD-10-CM | POA: Diagnosis not present

## 2021-02-08 DIAGNOSIS — Z9181 History of falling: Secondary | ICD-10-CM | POA: Diagnosis not present

## 2021-02-08 DIAGNOSIS — R278 Other lack of coordination: Secondary | ICD-10-CM | POA: Diagnosis not present

## 2021-02-08 DIAGNOSIS — Z8673 Personal history of transient ischemic attack (TIA), and cerebral infarction without residual deficits: Secondary | ICD-10-CM | POA: Diagnosis not present

## 2021-02-09 DIAGNOSIS — Z8673 Personal history of transient ischemic attack (TIA), and cerebral infarction without residual deficits: Secondary | ICD-10-CM | POA: Diagnosis not present

## 2021-02-09 DIAGNOSIS — Z9181 History of falling: Secondary | ICD-10-CM | POA: Diagnosis not present

## 2021-02-09 DIAGNOSIS — R278 Other lack of coordination: Secondary | ICD-10-CM | POA: Diagnosis not present

## 2021-02-09 DIAGNOSIS — M179 Osteoarthritis of knee, unspecified: Secondary | ICD-10-CM | POA: Diagnosis not present

## 2021-02-11 DIAGNOSIS — Z8673 Personal history of transient ischemic attack (TIA), and cerebral infarction without residual deficits: Secondary | ICD-10-CM | POA: Diagnosis not present

## 2021-02-11 DIAGNOSIS — Z9181 History of falling: Secondary | ICD-10-CM | POA: Diagnosis not present

## 2021-02-11 DIAGNOSIS — M179 Osteoarthritis of knee, unspecified: Secondary | ICD-10-CM | POA: Diagnosis not present

## 2021-02-11 DIAGNOSIS — R278 Other lack of coordination: Secondary | ICD-10-CM | POA: Diagnosis not present

## 2021-02-13 DIAGNOSIS — E559 Vitamin D deficiency, unspecified: Secondary | ICD-10-CM | POA: Diagnosis not present

## 2021-02-13 DIAGNOSIS — K5900 Constipation, unspecified: Secondary | ICD-10-CM | POA: Diagnosis not present

## 2021-02-13 DIAGNOSIS — I10 Essential (primary) hypertension: Secondary | ICD-10-CM | POA: Diagnosis not present

## 2021-02-13 DIAGNOSIS — L600 Ingrowing nail: Secondary | ICD-10-CM | POA: Diagnosis not present

## 2021-02-13 DIAGNOSIS — F0150 Vascular dementia without behavioral disturbance: Secondary | ICD-10-CM | POA: Diagnosis not present

## 2021-02-13 DIAGNOSIS — I63531 Cerebral infarction due to unspecified occlusion or stenosis of right posterior cerebral artery: Secondary | ICD-10-CM | POA: Diagnosis not present

## 2021-02-13 DIAGNOSIS — E785 Hyperlipidemia, unspecified: Secondary | ICD-10-CM | POA: Diagnosis not present

## 2021-02-13 DIAGNOSIS — F329 Major depressive disorder, single episode, unspecified: Secondary | ICD-10-CM | POA: Diagnosis not present

## 2021-02-13 DIAGNOSIS — K219 Gastro-esophageal reflux disease without esophagitis: Secondary | ICD-10-CM | POA: Diagnosis not present

## 2021-02-14 DIAGNOSIS — Z8673 Personal history of transient ischemic attack (TIA), and cerebral infarction without residual deficits: Secondary | ICD-10-CM | POA: Diagnosis not present

## 2021-02-14 DIAGNOSIS — M179 Osteoarthritis of knee, unspecified: Secondary | ICD-10-CM | POA: Diagnosis not present

## 2021-02-14 DIAGNOSIS — R278 Other lack of coordination: Secondary | ICD-10-CM | POA: Diagnosis not present

## 2021-02-14 DIAGNOSIS — Z9181 History of falling: Secondary | ICD-10-CM | POA: Diagnosis not present

## 2021-02-16 DIAGNOSIS — R278 Other lack of coordination: Secondary | ICD-10-CM | POA: Diagnosis not present

## 2021-02-16 DIAGNOSIS — M179 Osteoarthritis of knee, unspecified: Secondary | ICD-10-CM | POA: Diagnosis not present

## 2021-02-16 DIAGNOSIS — Z9181 History of falling: Secondary | ICD-10-CM | POA: Diagnosis not present

## 2021-02-16 DIAGNOSIS — Z8673 Personal history of transient ischemic attack (TIA), and cerebral infarction without residual deficits: Secondary | ICD-10-CM | POA: Diagnosis not present

## 2021-02-20 DIAGNOSIS — Z8673 Personal history of transient ischemic attack (TIA), and cerebral infarction without residual deficits: Secondary | ICD-10-CM | POA: Diagnosis not present

## 2021-02-20 DIAGNOSIS — Z9181 History of falling: Secondary | ICD-10-CM | POA: Diagnosis not present

## 2021-02-20 DIAGNOSIS — M179 Osteoarthritis of knee, unspecified: Secondary | ICD-10-CM | POA: Diagnosis not present

## 2021-02-20 DIAGNOSIS — R278 Other lack of coordination: Secondary | ICD-10-CM | POA: Diagnosis not present

## 2021-02-21 DIAGNOSIS — M179 Osteoarthritis of knee, unspecified: Secondary | ICD-10-CM | POA: Diagnosis not present

## 2021-02-21 DIAGNOSIS — Z8673 Personal history of transient ischemic attack (TIA), and cerebral infarction without residual deficits: Secondary | ICD-10-CM | POA: Diagnosis not present

## 2021-02-21 DIAGNOSIS — Z9181 History of falling: Secondary | ICD-10-CM | POA: Diagnosis not present

## 2021-02-21 DIAGNOSIS — R278 Other lack of coordination: Secondary | ICD-10-CM | POA: Diagnosis not present

## 2021-02-22 DIAGNOSIS — M179 Osteoarthritis of knee, unspecified: Secondary | ICD-10-CM | POA: Diagnosis not present

## 2021-02-22 DIAGNOSIS — Z8673 Personal history of transient ischemic attack (TIA), and cerebral infarction without residual deficits: Secondary | ICD-10-CM | POA: Diagnosis not present

## 2021-02-22 DIAGNOSIS — Z9181 History of falling: Secondary | ICD-10-CM | POA: Diagnosis not present

## 2021-02-22 DIAGNOSIS — R278 Other lack of coordination: Secondary | ICD-10-CM | POA: Diagnosis not present

## 2021-02-23 DIAGNOSIS — E559 Vitamin D deficiency, unspecified: Secondary | ICD-10-CM | POA: Diagnosis not present

## 2021-02-23 DIAGNOSIS — Z9181 History of falling: Secondary | ICD-10-CM | POA: Diagnosis not present

## 2021-02-23 DIAGNOSIS — I10 Essential (primary) hypertension: Secondary | ICD-10-CM | POA: Diagnosis not present

## 2021-02-23 DIAGNOSIS — K219 Gastro-esophageal reflux disease without esophagitis: Secondary | ICD-10-CM | POA: Diagnosis not present

## 2021-02-23 DIAGNOSIS — R5381 Other malaise: Secondary | ICD-10-CM | POA: Diagnosis not present

## 2021-02-23 DIAGNOSIS — E785 Hyperlipidemia, unspecified: Secondary | ICD-10-CM | POA: Diagnosis not present

## 2021-02-23 DIAGNOSIS — F0150 Vascular dementia without behavioral disturbance: Secondary | ICD-10-CM | POA: Diagnosis not present

## 2021-02-23 DIAGNOSIS — K5900 Constipation, unspecified: Secondary | ICD-10-CM | POA: Diagnosis not present

## 2021-02-23 DIAGNOSIS — L600 Ingrowing nail: Secondary | ICD-10-CM | POA: Diagnosis not present

## 2021-02-23 DIAGNOSIS — M179 Osteoarthritis of knee, unspecified: Secondary | ICD-10-CM | POA: Diagnosis not present

## 2021-02-23 DIAGNOSIS — Z8673 Personal history of transient ischemic attack (TIA), and cerebral infarction without residual deficits: Secondary | ICD-10-CM | POA: Diagnosis not present

## 2021-02-23 DIAGNOSIS — R278 Other lack of coordination: Secondary | ICD-10-CM | POA: Diagnosis not present

## 2021-02-24 DIAGNOSIS — R278 Other lack of coordination: Secondary | ICD-10-CM | POA: Diagnosis not present

## 2021-02-24 DIAGNOSIS — Z8673 Personal history of transient ischemic attack (TIA), and cerebral infarction without residual deficits: Secondary | ICD-10-CM | POA: Diagnosis not present

## 2021-02-24 DIAGNOSIS — Z9181 History of falling: Secondary | ICD-10-CM | POA: Diagnosis not present

## 2021-02-24 DIAGNOSIS — M179 Osteoarthritis of knee, unspecified: Secondary | ICD-10-CM | POA: Diagnosis not present

## 2021-02-25 DIAGNOSIS — Z8673 Personal history of transient ischemic attack (TIA), and cerebral infarction without residual deficits: Secondary | ICD-10-CM | POA: Diagnosis not present

## 2021-02-25 DIAGNOSIS — R278 Other lack of coordination: Secondary | ICD-10-CM | POA: Diagnosis not present

## 2021-02-25 DIAGNOSIS — Z9181 History of falling: Secondary | ICD-10-CM | POA: Diagnosis not present

## 2021-02-25 DIAGNOSIS — M179 Osteoarthritis of knee, unspecified: Secondary | ICD-10-CM | POA: Diagnosis not present

## 2021-03-01 DIAGNOSIS — Z9181 History of falling: Secondary | ICD-10-CM | POA: Diagnosis not present

## 2021-03-01 DIAGNOSIS — M179 Osteoarthritis of knee, unspecified: Secondary | ICD-10-CM | POA: Diagnosis not present

## 2021-03-01 DIAGNOSIS — Z8673 Personal history of transient ischemic attack (TIA), and cerebral infarction without residual deficits: Secondary | ICD-10-CM | POA: Diagnosis not present

## 2021-03-01 DIAGNOSIS — R278 Other lack of coordination: Secondary | ICD-10-CM | POA: Diagnosis not present

## 2021-03-02 DIAGNOSIS — Z9181 History of falling: Secondary | ICD-10-CM | POA: Diagnosis not present

## 2021-03-02 DIAGNOSIS — M179 Osteoarthritis of knee, unspecified: Secondary | ICD-10-CM | POA: Diagnosis not present

## 2021-03-02 DIAGNOSIS — R278 Other lack of coordination: Secondary | ICD-10-CM | POA: Diagnosis not present

## 2021-03-02 DIAGNOSIS — Z8673 Personal history of transient ischemic attack (TIA), and cerebral infarction without residual deficits: Secondary | ICD-10-CM | POA: Diagnosis not present

## 2021-03-07 DIAGNOSIS — Z8673 Personal history of transient ischemic attack (TIA), and cerebral infarction without residual deficits: Secondary | ICD-10-CM | POA: Diagnosis not present

## 2021-03-07 DIAGNOSIS — Z9181 History of falling: Secondary | ICD-10-CM | POA: Diagnosis not present

## 2021-03-07 DIAGNOSIS — R278 Other lack of coordination: Secondary | ICD-10-CM | POA: Diagnosis not present

## 2021-03-07 DIAGNOSIS — M179 Osteoarthritis of knee, unspecified: Secondary | ICD-10-CM | POA: Diagnosis not present

## 2021-03-08 DIAGNOSIS — M6281 Muscle weakness (generalized): Secondary | ICD-10-CM | POA: Diagnosis not present

## 2021-03-08 DIAGNOSIS — I10 Essential (primary) hypertension: Secondary | ICD-10-CM | POA: Diagnosis not present

## 2021-03-08 DIAGNOSIS — E559 Vitamin D deficiency, unspecified: Secondary | ICD-10-CM | POA: Diagnosis not present

## 2021-03-08 DIAGNOSIS — R293 Abnormal posture: Secondary | ICD-10-CM | POA: Diagnosis not present

## 2021-03-08 DIAGNOSIS — E785 Hyperlipidemia, unspecified: Secondary | ICD-10-CM | POA: Diagnosis not present

## 2021-03-08 DIAGNOSIS — K5900 Constipation, unspecified: Secondary | ICD-10-CM | POA: Diagnosis not present

## 2021-03-08 DIAGNOSIS — G3183 Dementia with Lewy bodies: Secondary | ICD-10-CM | POA: Diagnosis not present

## 2021-03-08 DIAGNOSIS — K219 Gastro-esophageal reflux disease without esophagitis: Secondary | ICD-10-CM | POA: Diagnosis not present

## 2021-03-08 DIAGNOSIS — R278 Other lack of coordination: Secondary | ICD-10-CM | POA: Diagnosis not present

## 2021-03-09 ENCOUNTER — Ambulatory Visit: Payer: Medicare Other | Admitting: Podiatry

## 2021-03-10 DIAGNOSIS — M6281 Muscle weakness (generalized): Secondary | ICD-10-CM | POA: Diagnosis not present

## 2021-03-10 DIAGNOSIS — R278 Other lack of coordination: Secondary | ICD-10-CM | POA: Diagnosis not present

## 2021-03-10 DIAGNOSIS — R293 Abnormal posture: Secondary | ICD-10-CM | POA: Diagnosis not present

## 2021-03-10 DIAGNOSIS — G3183 Dementia with Lewy bodies: Secondary | ICD-10-CM | POA: Diagnosis not present

## 2021-03-13 DIAGNOSIS — I10 Essential (primary) hypertension: Secondary | ICD-10-CM | POA: Diagnosis not present

## 2021-03-13 DIAGNOSIS — Z79899 Other long term (current) drug therapy: Secondary | ICD-10-CM | POA: Diagnosis not present

## 2021-03-14 DIAGNOSIS — M6281 Muscle weakness (generalized): Secondary | ICD-10-CM | POA: Diagnosis not present

## 2021-03-14 DIAGNOSIS — R293 Abnormal posture: Secondary | ICD-10-CM | POA: Diagnosis not present

## 2021-03-14 DIAGNOSIS — R278 Other lack of coordination: Secondary | ICD-10-CM | POA: Diagnosis not present

## 2021-03-14 DIAGNOSIS — G3183 Dementia with Lewy bodies: Secondary | ICD-10-CM | POA: Diagnosis not present

## 2021-03-15 DIAGNOSIS — G3183 Dementia with Lewy bodies: Secondary | ICD-10-CM | POA: Diagnosis not present

## 2021-03-15 DIAGNOSIS — R293 Abnormal posture: Secondary | ICD-10-CM | POA: Diagnosis not present

## 2021-03-15 DIAGNOSIS — M6281 Muscle weakness (generalized): Secondary | ICD-10-CM | POA: Diagnosis not present

## 2021-03-15 DIAGNOSIS — R278 Other lack of coordination: Secondary | ICD-10-CM | POA: Diagnosis not present

## 2021-03-16 DIAGNOSIS — M6281 Muscle weakness (generalized): Secondary | ICD-10-CM | POA: Diagnosis not present

## 2021-03-16 DIAGNOSIS — G3183 Dementia with Lewy bodies: Secondary | ICD-10-CM | POA: Diagnosis not present

## 2021-03-16 DIAGNOSIS — R293 Abnormal posture: Secondary | ICD-10-CM | POA: Diagnosis not present

## 2021-03-16 DIAGNOSIS — R278 Other lack of coordination: Secondary | ICD-10-CM | POA: Diagnosis not present

## 2021-03-17 DIAGNOSIS — R293 Abnormal posture: Secondary | ICD-10-CM | POA: Diagnosis not present

## 2021-03-17 DIAGNOSIS — R278 Other lack of coordination: Secondary | ICD-10-CM | POA: Diagnosis not present

## 2021-03-17 DIAGNOSIS — G3183 Dementia with Lewy bodies: Secondary | ICD-10-CM | POA: Diagnosis not present

## 2021-03-17 DIAGNOSIS — M6281 Muscle weakness (generalized): Secondary | ICD-10-CM | POA: Diagnosis not present

## 2021-03-20 DIAGNOSIS — R278 Other lack of coordination: Secondary | ICD-10-CM | POA: Diagnosis not present

## 2021-03-20 DIAGNOSIS — G3183 Dementia with Lewy bodies: Secondary | ICD-10-CM | POA: Diagnosis not present

## 2021-03-20 DIAGNOSIS — M6281 Muscle weakness (generalized): Secondary | ICD-10-CM | POA: Diagnosis not present

## 2021-03-20 DIAGNOSIS — R293 Abnormal posture: Secondary | ICD-10-CM | POA: Diagnosis not present

## 2021-03-21 DIAGNOSIS — R293 Abnormal posture: Secondary | ICD-10-CM | POA: Diagnosis not present

## 2021-03-21 DIAGNOSIS — M6281 Muscle weakness (generalized): Secondary | ICD-10-CM | POA: Diagnosis not present

## 2021-03-21 DIAGNOSIS — R278 Other lack of coordination: Secondary | ICD-10-CM | POA: Diagnosis not present

## 2021-03-21 DIAGNOSIS — G3183 Dementia with Lewy bodies: Secondary | ICD-10-CM | POA: Diagnosis not present

## 2021-03-22 DIAGNOSIS — R278 Other lack of coordination: Secondary | ICD-10-CM | POA: Diagnosis not present

## 2021-03-22 DIAGNOSIS — M6281 Muscle weakness (generalized): Secondary | ICD-10-CM | POA: Diagnosis not present

## 2021-03-22 DIAGNOSIS — G3183 Dementia with Lewy bodies: Secondary | ICD-10-CM | POA: Diagnosis not present

## 2021-03-22 DIAGNOSIS — R293 Abnormal posture: Secondary | ICD-10-CM | POA: Diagnosis not present

## 2021-03-23 DIAGNOSIS — F0150 Vascular dementia without behavioral disturbance: Secondary | ICD-10-CM | POA: Diagnosis not present

## 2021-03-23 DIAGNOSIS — F329 Major depressive disorder, single episode, unspecified: Secondary | ICD-10-CM | POA: Diagnosis not present

## 2021-03-23 DIAGNOSIS — I63531 Cerebral infarction due to unspecified occlusion or stenosis of right posterior cerebral artery: Secondary | ICD-10-CM | POA: Diagnosis not present

## 2021-03-23 DIAGNOSIS — E559 Vitamin D deficiency, unspecified: Secondary | ICD-10-CM | POA: Diagnosis not present

## 2021-03-23 DIAGNOSIS — F321 Major depressive disorder, single episode, moderate: Secondary | ICD-10-CM | POA: Diagnosis not present

## 2021-03-23 DIAGNOSIS — F411 Generalized anxiety disorder: Secondary | ICD-10-CM | POA: Diagnosis not present

## 2021-03-23 DIAGNOSIS — G3183 Dementia with Lewy bodies: Secondary | ICD-10-CM | POA: Diagnosis not present

## 2021-03-23 DIAGNOSIS — I10 Essential (primary) hypertension: Secondary | ICD-10-CM | POA: Diagnosis not present

## 2021-03-23 DIAGNOSIS — E785 Hyperlipidemia, unspecified: Secondary | ICD-10-CM | POA: Diagnosis not present

## 2021-03-23 DIAGNOSIS — M6281 Muscle weakness (generalized): Secondary | ICD-10-CM | POA: Diagnosis not present

## 2021-03-23 DIAGNOSIS — R293 Abnormal posture: Secondary | ICD-10-CM | POA: Diagnosis not present

## 2021-03-23 DIAGNOSIS — R278 Other lack of coordination: Secondary | ICD-10-CM | POA: Diagnosis not present

## 2021-03-23 DIAGNOSIS — G301 Alzheimer's disease with late onset: Secondary | ICD-10-CM | POA: Diagnosis not present

## 2021-03-24 DIAGNOSIS — R293 Abnormal posture: Secondary | ICD-10-CM | POA: Diagnosis not present

## 2021-03-24 DIAGNOSIS — M6281 Muscle weakness (generalized): Secondary | ICD-10-CM | POA: Diagnosis not present

## 2021-03-24 DIAGNOSIS — G3183 Dementia with Lewy bodies: Secondary | ICD-10-CM | POA: Diagnosis not present

## 2021-03-24 DIAGNOSIS — R278 Other lack of coordination: Secondary | ICD-10-CM | POA: Diagnosis not present

## 2021-03-28 DIAGNOSIS — R278 Other lack of coordination: Secondary | ICD-10-CM | POA: Diagnosis not present

## 2021-03-28 DIAGNOSIS — G3183 Dementia with Lewy bodies: Secondary | ICD-10-CM | POA: Diagnosis not present

## 2021-03-28 DIAGNOSIS — M6281 Muscle weakness (generalized): Secondary | ICD-10-CM | POA: Diagnosis not present

## 2021-03-28 DIAGNOSIS — R293 Abnormal posture: Secondary | ICD-10-CM | POA: Diagnosis not present

## 2021-03-30 DIAGNOSIS — E559 Vitamin D deficiency, unspecified: Secondary | ICD-10-CM | POA: Diagnosis not present

## 2021-03-30 DIAGNOSIS — K219 Gastro-esophageal reflux disease without esophagitis: Secondary | ICD-10-CM | POA: Diagnosis not present

## 2021-03-30 DIAGNOSIS — I10 Essential (primary) hypertension: Secondary | ICD-10-CM | POA: Diagnosis not present

## 2021-03-30 DIAGNOSIS — E785 Hyperlipidemia, unspecified: Secondary | ICD-10-CM | POA: Diagnosis not present

## 2021-03-30 DIAGNOSIS — F0150 Vascular dementia without behavioral disturbance: Secondary | ICD-10-CM | POA: Diagnosis not present

## 2021-03-30 DIAGNOSIS — K5900 Constipation, unspecified: Secondary | ICD-10-CM | POA: Diagnosis not present

## 2021-03-30 DIAGNOSIS — F419 Anxiety disorder, unspecified: Secondary | ICD-10-CM | POA: Diagnosis not present

## 2021-04-01 DIAGNOSIS — G3183 Dementia with Lewy bodies: Secondary | ICD-10-CM | POA: Diagnosis not present

## 2021-04-01 DIAGNOSIS — M6281 Muscle weakness (generalized): Secondary | ICD-10-CM | POA: Diagnosis not present

## 2021-04-01 DIAGNOSIS — R293 Abnormal posture: Secondary | ICD-10-CM | POA: Diagnosis not present

## 2021-04-01 DIAGNOSIS — R278 Other lack of coordination: Secondary | ICD-10-CM | POA: Diagnosis not present

## 2021-04-02 DIAGNOSIS — R278 Other lack of coordination: Secondary | ICD-10-CM | POA: Diagnosis not present

## 2021-04-02 DIAGNOSIS — R293 Abnormal posture: Secondary | ICD-10-CM | POA: Diagnosis not present

## 2021-04-02 DIAGNOSIS — G3183 Dementia with Lewy bodies: Secondary | ICD-10-CM | POA: Diagnosis not present

## 2021-04-02 DIAGNOSIS — M6281 Muscle weakness (generalized): Secondary | ICD-10-CM | POA: Diagnosis not present

## 2021-04-04 ENCOUNTER — Ambulatory Visit (INDEPENDENT_AMBULATORY_CARE_PROVIDER_SITE_OTHER): Payer: Medicare Other | Admitting: Podiatry

## 2021-04-04 ENCOUNTER — Other Ambulatory Visit: Payer: Self-pay

## 2021-04-04 ENCOUNTER — Encounter: Payer: Self-pay | Admitting: Podiatry

## 2021-04-04 DIAGNOSIS — B351 Tinea unguium: Secondary | ICD-10-CM

## 2021-04-04 DIAGNOSIS — M6281 Muscle weakness (generalized): Secondary | ICD-10-CM | POA: Diagnosis not present

## 2021-04-04 DIAGNOSIS — M79676 Pain in unspecified toe(s): Secondary | ICD-10-CM | POA: Diagnosis not present

## 2021-04-04 DIAGNOSIS — R293 Abnormal posture: Secondary | ICD-10-CM | POA: Diagnosis not present

## 2021-04-04 DIAGNOSIS — G3183 Dementia with Lewy bodies: Secondary | ICD-10-CM | POA: Diagnosis not present

## 2021-04-04 DIAGNOSIS — R278 Other lack of coordination: Secondary | ICD-10-CM | POA: Diagnosis not present

## 2021-04-04 NOTE — Progress Notes (Signed)
Subjective:  Patient ID: Claire West, female    DOB: [birth year],  MRN: [id number] HPI Chief Complaint  Patient presents with   Toe Pain    Hallux left has been sore, stays at nursing home and doesn't have a podiatrist coming to cut nails-needs other toenails cut too   New Patient (Initial Visit)    80 y.o. female presents with the above complaint.   ROS: Denies fever chills nausea vomiting muscle aches pains calf pain back pain chest pain shortness of breath.  Past Medical History:  Diagnosis Date   Arthritis    Depression    Gait instability    walks with cane   GERD (gastroesophageal reflux disease)    History of blood transfusion    Hyperlipemia    Hypertension    Knee pain    Metabolic encephalopathy    Mild cognitive impairment with memory loss    Seasonal allergies    Skin cancer    Stroke (HCC)    TIA (transient ischemic attack)    Past Surgical History:  Procedure Laterality Date   ABDOMINAL HYSTERECTOMY     ARTHROSCOPIC REPAIR ACL Left    COLONOSCOPY     COLONOSCOPY N/A 11/23/2015   Procedure: COLONOSCOPY;  Surgeon: Rogene Houston, MD;  Location: [facility];  Service: Endoscopy;  Laterality: N/A;  830   EYE SURGERY Bilateral    Cataracts   SKIN CANCER EXCISION     Right shoulder   TOTAL KNEE ARTHROPLASTY Left 05/05/2018   Procedure: LEFT TOTAL KNEE ARTHROPLASTY;  Surgeon: Gaynelle Arabian, MD;  Location: [facility];  Service: Orthopedics;  Laterality: Left;  with block   TOTAL KNEE ARTHROPLASTY Right 06/22/2019   Procedure: TOTAL KNEE ARTHROPLASTY;  Surgeon: Gaynelle Arabian, MD;  Location: [facility];  Service: Orthopedics;  Laterality: Right;  53min    Current Outpatient Medications:    acetaminophen (TYLENOL) 500 MG tablet, Take 500 mg by mouth at bedtime. *May take one tablet daily as needed for pain*, Disp: , Rfl:    aspirin EC 81 MG tablet, Take 81 mg by mouth every morning. , Disp: , Rfl:    buPROPion (WELLBUTRIN) 75 MG tablet, Take 75 mg by mouth 2  (two) times daily., Disp: , Rfl:    busPIRone (BUSPAR) 10 MG tablet, Take 10 mg by mouth 2 (two) times daily., Disp: , Rfl:    busPIRone (BUSPAR) 5 MG tablet, Take 5 mg by mouth 2 (two) times daily., Disp: , Rfl:    calcium carbonate (TUMS - DOSED IN MG ELEMENTAL CALCIUM) 500 MG chewable tablet, Chew 1 tablet by mouth daily as needed for indigestion or heartburn., Disp: , Rfl:    Cholecalciferol (VITAMIN D3 PO), Take 1 capsule by mouth daily at 12 noon. , Disp: , Rfl:    clopidogrel (PLAVIX) 75 MG tablet, Take 1 tablet (75 mg total) by mouth daily. (Patient taking differently: Take 75 mg by mouth every morning. ), Disp: 30 tablet, Rfl: 0   Emollient (CERAVE EX), Apply 1 application topically daily as needed (for itching and dryness)., Disp: , Rfl:    escitalopram (LEXAPRO) 20 MG tablet, Take 20 mg by mouth at bedtime. , Disp: , Rfl: 1   guaiFENesin-dextromethorphan (ROBITUSSIN DM) 100-10 MG/5ML syrup, Take 2.5-5 mLs by mouth every 6 (six) hours as needed for cough., Disp: 118 mL, Rfl: 0   hydrocortisone cream 1 %, Apply 1 application topically daily as needed for itching., Disp: , Rfl:    LORazepam (ATIVAN) 0.5  MG tablet, Take by mouth., Disp: , Rfl:    Magnesium Oxide (MAG-200 PO), Take by mouth daily., Disp: , Rfl:    metoprolol succinate (TOPROL-XL) 50 MG 24 hr tablet, Take 50 mg by mouth 2 (two) times daily., Disp: , Rfl:    metoprolol tartrate (LOPRESSOR) 50 MG tablet, Take 25-50 mg by mouth See admin instructions. 50mg  in the morning and 25mg  in the evening, Disp: , Rfl:    Multiple Vitamin (MULTIVITAMIN ADULT PO), Take 1 tablet by mouth daily at 12 noon., Disp: , Rfl:    olmesartan (BENICAR) 40 MG tablet, Take 40 mg by mouth daily., Disp: , Rfl:    Omega-3 Fatty Acids (FISH OIL PO), Take 1 capsule by mouth every evening. , Disp: , Rfl:    Omega-3 Krill Oil 500 MG CAPS, Take 1 capsule by mouth every evening. , Disp: , Rfl:    omeprazole (PRILOSEC) 20 MG capsule, Take 20 mg by mouth 2 (two)  times daily before a meal. , Disp: , Rfl:    potassium chloride (K-DUR) 10 MEQ tablet, Take 1 tablet (10 mEq total) by mouth daily. (Patient taking differently: Take 10 mEq by mouth daily with supper. ), Disp: 5 tablet, Rfl: 0   pravastatin (PRAVACHOL) 20 MG tablet, Take 20 mg by mouth at bedtime. , Disp: , Rfl:    Probiotic Product (PROBIOTIC-10 PO), Take 1 capsule by mouth daily. RENEW Probiotic, Disp: , Rfl:    triamcinolone cream (KENALOG) 0.1 %, Apply 1 application topically daily as needed (for itching)., Disp: , Rfl:    TURMERIC PO, Take 1 capsule by mouth daily at 12 noon. , Disp: , Rfl:   Allergies  Allergen Reactions   Penicillins Anaphylaxis, Swelling and Rash    Oral rash/peeling  **Has tolerated Rocephin, Vantin since 2017**     Adhesive [Tape] Itching and Rash    Irritation at site   Codeine Hives and Rash   Neosporin [Neomycin-Bacitracin Zn-Polymyx] Rash   Sulfa Antibiotics Hives and Rash   Review of Systems Objective:  There were no vitals filed for this visit.  General: Well developed, nourished, in no acute distress, alert and oriented x3   Dermatological: Skin is warm, dry and supple bilateral. Nails x 10 are well maintained; remaining integument appears unremarkable at this time. There are no open sores, no preulcerative lesions, no rash or signs of infection present.  Vascular: Dorsalis Pedis artery and Posterior Tibial artery pedal pulses are 2/4 bilateral with immedate capillary fill time. Pedal hair growth present. No varicosities and no lower extremity edema present bilateral.   Neruologic: Grossly intact via light touch bilateral. Vibratory intact via tuning fork bilateral. Protective threshold with Semmes Wienstein monofilament intact to all pedal sites bilateral. Patellar and Achilles deep tendon reflexes 2+ bilateral. No Babinski or clonus noted bilateral.   Musculoskeletal: No gross boney pedal deformities bilateral. No pain, crepitus, or limitation  noted with foot and ankle range of motion bilateral. Muscular strength 5/5 in all groups tested bilateral.  Severe hammertoe deformities right greater than left hallux valgus deformity with overlapping second toe right foot.  Gait: Unassisted, Nonantalgic.    Radiographs:  None taken  Assessment & Plan:   Assessment: Hallux valgus deformities with hammertoe deformities bilateral.  Nail dystrophy has hallux and lesser digits bilateral.  Plan: Debrided toenails 1 through 5 bilateral.     Kiki Bivens T. [place], [place]

## 2021-04-05 DIAGNOSIS — E559 Vitamin D deficiency, unspecified: Secondary | ICD-10-CM | POA: Diagnosis not present

## 2021-04-05 DIAGNOSIS — K219 Gastro-esophageal reflux disease without esophagitis: Secondary | ICD-10-CM | POA: Diagnosis not present

## 2021-04-05 DIAGNOSIS — E785 Hyperlipidemia, unspecified: Secondary | ICD-10-CM | POA: Diagnosis not present

## 2021-04-05 DIAGNOSIS — K5900 Constipation, unspecified: Secondary | ICD-10-CM | POA: Diagnosis not present

## 2021-04-17 DIAGNOSIS — F329 Major depressive disorder, single episode, unspecified: Secondary | ICD-10-CM | POA: Diagnosis not present

## 2021-04-17 DIAGNOSIS — E559 Vitamin D deficiency, unspecified: Secondary | ICD-10-CM | POA: Diagnosis not present

## 2021-04-17 DIAGNOSIS — F0150 Vascular dementia without behavioral disturbance: Secondary | ICD-10-CM | POA: Diagnosis not present

## 2021-04-17 DIAGNOSIS — I63531 Cerebral infarction due to unspecified occlusion or stenosis of right posterior cerebral artery: Secondary | ICD-10-CM | POA: Diagnosis not present

## 2021-04-17 DIAGNOSIS — E785 Hyperlipidemia, unspecified: Secondary | ICD-10-CM | POA: Diagnosis not present

## 2021-04-17 DIAGNOSIS — I10 Essential (primary) hypertension: Secondary | ICD-10-CM | POA: Diagnosis not present

## 2021-04-27 DIAGNOSIS — I10 Essential (primary) hypertension: Secondary | ICD-10-CM | POA: Diagnosis not present

## 2021-04-27 DIAGNOSIS — E785 Hyperlipidemia, unspecified: Secondary | ICD-10-CM | POA: Diagnosis not present

## 2021-04-27 DIAGNOSIS — K5900 Constipation, unspecified: Secondary | ICD-10-CM | POA: Diagnosis not present

## 2021-04-27 DIAGNOSIS — E559 Vitamin D deficiency, unspecified: Secondary | ICD-10-CM | POA: Diagnosis not present

## 2021-04-27 DIAGNOSIS — F0150 Vascular dementia without behavioral disturbance: Secondary | ICD-10-CM | POA: Diagnosis not present

## 2021-04-27 DIAGNOSIS — K219 Gastro-esophageal reflux disease without esophagitis: Secondary | ICD-10-CM | POA: Diagnosis not present

## 2021-05-03 DIAGNOSIS — E785 Hyperlipidemia, unspecified: Secondary | ICD-10-CM | POA: Diagnosis not present

## 2021-05-03 DIAGNOSIS — K5900 Constipation, unspecified: Secondary | ICD-10-CM | POA: Diagnosis not present

## 2021-05-03 DIAGNOSIS — I10 Essential (primary) hypertension: Secondary | ICD-10-CM | POA: Diagnosis not present

## 2021-05-03 DIAGNOSIS — E559 Vitamin D deficiency, unspecified: Secondary | ICD-10-CM | POA: Diagnosis not present

## 2021-05-03 DIAGNOSIS — K219 Gastro-esophageal reflux disease without esophagitis: Secondary | ICD-10-CM | POA: Diagnosis not present

## 2021-05-12 DIAGNOSIS — F329 Major depressive disorder, single episode, unspecified: Secondary | ICD-10-CM | POA: Diagnosis not present

## 2021-05-12 DIAGNOSIS — I10 Essential (primary) hypertension: Secondary | ICD-10-CM | POA: Diagnosis not present

## 2021-05-12 DIAGNOSIS — E559 Vitamin D deficiency, unspecified: Secondary | ICD-10-CM | POA: Diagnosis not present

## 2021-05-12 DIAGNOSIS — E785 Hyperlipidemia, unspecified: Secondary | ICD-10-CM | POA: Diagnosis not present

## 2021-05-12 DIAGNOSIS — I63531 Cerebral infarction due to unspecified occlusion or stenosis of right posterior cerebral artery: Secondary | ICD-10-CM | POA: Diagnosis not present

## 2021-05-12 DIAGNOSIS — F0150 Vascular dementia without behavioral disturbance: Secondary | ICD-10-CM | POA: Diagnosis not present

## 2021-05-17 DIAGNOSIS — X32XXXD Exposure to sunlight, subsequent encounter: Secondary | ICD-10-CM | POA: Diagnosis not present

## 2021-05-17 DIAGNOSIS — L570 Actinic keratosis: Secondary | ICD-10-CM | POA: Diagnosis not present

## 2021-05-17 DIAGNOSIS — L820 Inflamed seborrheic keratosis: Secondary | ICD-10-CM | POA: Diagnosis not present

## 2021-05-22 DIAGNOSIS — L989 Disorder of the skin and subcutaneous tissue, unspecified: Secondary | ICD-10-CM | POA: Diagnosis not present

## 2021-05-22 DIAGNOSIS — E559 Vitamin D deficiency, unspecified: Secondary | ICD-10-CM | POA: Diagnosis not present

## 2021-05-22 DIAGNOSIS — F0150 Vascular dementia without behavioral disturbance: Secondary | ICD-10-CM | POA: Diagnosis not present

## 2021-05-22 DIAGNOSIS — E785 Hyperlipidemia, unspecified: Secondary | ICD-10-CM | POA: Diagnosis not present

## 2021-05-22 DIAGNOSIS — I10 Essential (primary) hypertension: Secondary | ICD-10-CM | POA: Diagnosis not present

## 2021-05-22 DIAGNOSIS — K219 Gastro-esophageal reflux disease without esophagitis: Secondary | ICD-10-CM | POA: Diagnosis not present

## 2021-05-22 DIAGNOSIS — K5900 Constipation, unspecified: Secondary | ICD-10-CM | POA: Diagnosis not present

## 2021-05-22 DIAGNOSIS — R4182 Altered mental status, unspecified: Secondary | ICD-10-CM | POA: Diagnosis not present

## 2021-05-23 DIAGNOSIS — E119 Type 2 diabetes mellitus without complications: Secondary | ICD-10-CM | POA: Diagnosis not present

## 2021-05-23 DIAGNOSIS — N390 Urinary tract infection, site not specified: Secondary | ICD-10-CM | POA: Diagnosis not present

## 2021-05-25 DIAGNOSIS — K219 Gastro-esophageal reflux disease without esophagitis: Secondary | ICD-10-CM | POA: Diagnosis not present

## 2021-05-25 DIAGNOSIS — K5900 Constipation, unspecified: Secondary | ICD-10-CM | POA: Diagnosis not present

## 2021-05-25 DIAGNOSIS — E559 Vitamin D deficiency, unspecified: Secondary | ICD-10-CM | POA: Diagnosis not present

## 2021-05-25 DIAGNOSIS — I10 Essential (primary) hypertension: Secondary | ICD-10-CM | POA: Diagnosis not present

## 2021-05-25 DIAGNOSIS — E785 Hyperlipidemia, unspecified: Secondary | ICD-10-CM | POA: Diagnosis not present

## 2021-05-25 DIAGNOSIS — L989 Disorder of the skin and subcutaneous tissue, unspecified: Secondary | ICD-10-CM | POA: Diagnosis not present

## 2021-05-29 DIAGNOSIS — G3183 Dementia with Lewy bodies: Secondary | ICD-10-CM | POA: Diagnosis not present

## 2021-05-29 DIAGNOSIS — R41841 Cognitive communication deficit: Secondary | ICD-10-CM | POA: Diagnosis not present

## 2021-05-30 DIAGNOSIS — G3183 Dementia with Lewy bodies: Secondary | ICD-10-CM | POA: Diagnosis not present

## 2021-05-30 DIAGNOSIS — R41841 Cognitive communication deficit: Secondary | ICD-10-CM | POA: Diagnosis not present

## 2021-05-31 DIAGNOSIS — F0150 Vascular dementia without behavioral disturbance: Secondary | ICD-10-CM | POA: Diagnosis not present

## 2021-05-31 DIAGNOSIS — N390 Urinary tract infection, site not specified: Secondary | ICD-10-CM | POA: Diagnosis not present

## 2021-05-31 DIAGNOSIS — G3183 Dementia with Lewy bodies: Secondary | ICD-10-CM | POA: Diagnosis not present

## 2021-05-31 DIAGNOSIS — R41841 Cognitive communication deficit: Secondary | ICD-10-CM | POA: Diagnosis not present

## 2021-05-31 DIAGNOSIS — K5900 Constipation, unspecified: Secondary | ICD-10-CM | POA: Diagnosis not present

## 2021-05-31 DIAGNOSIS — L989 Disorder of the skin and subcutaneous tissue, unspecified: Secondary | ICD-10-CM | POA: Diagnosis not present

## 2021-05-31 DIAGNOSIS — I10 Essential (primary) hypertension: Secondary | ICD-10-CM | POA: Diagnosis not present

## 2021-06-01 DIAGNOSIS — R41841 Cognitive communication deficit: Secondary | ICD-10-CM | POA: Diagnosis not present

## 2021-06-01 DIAGNOSIS — G3183 Dementia with Lewy bodies: Secondary | ICD-10-CM | POA: Diagnosis not present

## 2021-06-02 DIAGNOSIS — G3183 Dementia with Lewy bodies: Secondary | ICD-10-CM | POA: Diagnosis not present

## 2021-06-02 DIAGNOSIS — R41841 Cognitive communication deficit: Secondary | ICD-10-CM | POA: Diagnosis not present

## 2021-06-05 DIAGNOSIS — R41841 Cognitive communication deficit: Secondary | ICD-10-CM | POA: Diagnosis not present

## 2021-06-05 DIAGNOSIS — M2041 Other hammer toe(s) (acquired), right foot: Secondary | ICD-10-CM | POA: Diagnosis not present

## 2021-06-05 DIAGNOSIS — L603 Nail dystrophy: Secondary | ICD-10-CM | POA: Diagnosis not present

## 2021-06-05 DIAGNOSIS — I7091 Generalized atherosclerosis: Secondary | ICD-10-CM | POA: Diagnosis not present

## 2021-06-05 DIAGNOSIS — M2042 Other hammer toe(s) (acquired), left foot: Secondary | ICD-10-CM | POA: Diagnosis not present

## 2021-06-05 DIAGNOSIS — G3183 Dementia with Lewy bodies: Secondary | ICD-10-CM | POA: Diagnosis not present

## 2021-06-06 DIAGNOSIS — R41841 Cognitive communication deficit: Secondary | ICD-10-CM | POA: Diagnosis not present

## 2021-06-06 DIAGNOSIS — G3183 Dementia with Lewy bodies: Secondary | ICD-10-CM | POA: Diagnosis not present

## 2021-06-07 DIAGNOSIS — G3183 Dementia with Lewy bodies: Secondary | ICD-10-CM | POA: Diagnosis not present

## 2021-06-07 DIAGNOSIS — R41841 Cognitive communication deficit: Secondary | ICD-10-CM | POA: Diagnosis not present

## 2021-06-08 DIAGNOSIS — R41841 Cognitive communication deficit: Secondary | ICD-10-CM | POA: Diagnosis not present

## 2021-06-08 DIAGNOSIS — G3183 Dementia with Lewy bodies: Secondary | ICD-10-CM | POA: Diagnosis not present

## 2021-06-09 DIAGNOSIS — G3183 Dementia with Lewy bodies: Secondary | ICD-10-CM | POA: Diagnosis not present

## 2021-06-09 DIAGNOSIS — R41841 Cognitive communication deficit: Secondary | ICD-10-CM | POA: Diagnosis not present

## 2021-06-13 DIAGNOSIS — I10 Essential (primary) hypertension: Secondary | ICD-10-CM | POA: Diagnosis not present

## 2021-06-13 DIAGNOSIS — G3183 Dementia with Lewy bodies: Secondary | ICD-10-CM | POA: Diagnosis not present

## 2021-06-13 DIAGNOSIS — I63531 Cerebral infarction due to unspecified occlusion or stenosis of right posterior cerebral artery: Secondary | ICD-10-CM | POA: Diagnosis not present

## 2021-06-13 DIAGNOSIS — F0150 Vascular dementia without behavioral disturbance: Secondary | ICD-10-CM | POA: Diagnosis not present

## 2021-06-13 DIAGNOSIS — E785 Hyperlipidemia, unspecified: Secondary | ICD-10-CM | POA: Diagnosis not present

## 2021-06-13 DIAGNOSIS — R41841 Cognitive communication deficit: Secondary | ICD-10-CM | POA: Diagnosis not present

## 2021-06-13 DIAGNOSIS — E559 Vitamin D deficiency, unspecified: Secondary | ICD-10-CM | POA: Diagnosis not present

## 2021-06-14 DIAGNOSIS — R41841 Cognitive communication deficit: Secondary | ICD-10-CM | POA: Diagnosis not present

## 2021-06-14 DIAGNOSIS — G3183 Dementia with Lewy bodies: Secondary | ICD-10-CM | POA: Diagnosis not present

## 2021-06-15 DIAGNOSIS — G3183 Dementia with Lewy bodies: Secondary | ICD-10-CM | POA: Diagnosis not present

## 2021-06-15 DIAGNOSIS — R41841 Cognitive communication deficit: Secondary | ICD-10-CM | POA: Diagnosis not present

## 2021-06-16 DIAGNOSIS — R41841 Cognitive communication deficit: Secondary | ICD-10-CM | POA: Diagnosis not present

## 2021-06-16 DIAGNOSIS — G3183 Dementia with Lewy bodies: Secondary | ICD-10-CM | POA: Diagnosis not present

## 2021-06-19 DIAGNOSIS — G3183 Dementia with Lewy bodies: Secondary | ICD-10-CM | POA: Diagnosis not present

## 2021-06-19 DIAGNOSIS — R41841 Cognitive communication deficit: Secondary | ICD-10-CM | POA: Diagnosis not present

## 2021-06-20 DIAGNOSIS — R41841 Cognitive communication deficit: Secondary | ICD-10-CM | POA: Diagnosis not present

## 2021-06-20 DIAGNOSIS — G3183 Dementia with Lewy bodies: Secondary | ICD-10-CM | POA: Diagnosis not present

## 2021-06-21 DIAGNOSIS — R41841 Cognitive communication deficit: Secondary | ICD-10-CM | POA: Diagnosis not present

## 2021-06-21 DIAGNOSIS — G3183 Dementia with Lewy bodies: Secondary | ICD-10-CM | POA: Diagnosis not present

## 2021-06-22 DIAGNOSIS — L989 Disorder of the skin and subcutaneous tissue, unspecified: Secondary | ICD-10-CM | POA: Diagnosis not present

## 2021-06-22 DIAGNOSIS — E785 Hyperlipidemia, unspecified: Secondary | ICD-10-CM | POA: Diagnosis not present

## 2021-06-22 DIAGNOSIS — N390 Urinary tract infection, site not specified: Secondary | ICD-10-CM | POA: Diagnosis not present

## 2021-06-22 DIAGNOSIS — I10 Essential (primary) hypertension: Secondary | ICD-10-CM | POA: Diagnosis not present

## 2021-06-22 DIAGNOSIS — F0150 Vascular dementia without behavioral disturbance: Secondary | ICD-10-CM | POA: Diagnosis not present

## 2021-06-22 DIAGNOSIS — R41841 Cognitive communication deficit: Secondary | ICD-10-CM | POA: Diagnosis not present

## 2021-06-22 DIAGNOSIS — G3183 Dementia with Lewy bodies: Secondary | ICD-10-CM | POA: Diagnosis not present

## 2021-06-22 DIAGNOSIS — K219 Gastro-esophageal reflux disease without esophagitis: Secondary | ICD-10-CM | POA: Diagnosis not present

## 2021-06-23 DIAGNOSIS — R41841 Cognitive communication deficit: Secondary | ICD-10-CM | POA: Diagnosis not present

## 2021-06-23 DIAGNOSIS — G3183 Dementia with Lewy bodies: Secondary | ICD-10-CM | POA: Diagnosis not present

## 2021-06-26 DIAGNOSIS — R41841 Cognitive communication deficit: Secondary | ICD-10-CM | POA: Diagnosis not present

## 2021-06-26 DIAGNOSIS — G3183 Dementia with Lewy bodies: Secondary | ICD-10-CM | POA: Diagnosis not present

## 2021-06-28 DIAGNOSIS — E559 Vitamin D deficiency, unspecified: Secondary | ICD-10-CM | POA: Diagnosis not present

## 2021-06-28 DIAGNOSIS — K219 Gastro-esophageal reflux disease without esophagitis: Secondary | ICD-10-CM | POA: Diagnosis not present

## 2021-06-28 DIAGNOSIS — E785 Hyperlipidemia, unspecified: Secondary | ICD-10-CM | POA: Diagnosis not present

## 2021-06-28 DIAGNOSIS — Z515 Encounter for palliative care: Secondary | ICD-10-CM | POA: Diagnosis not present

## 2021-06-28 DIAGNOSIS — K5900 Constipation, unspecified: Secondary | ICD-10-CM | POA: Diagnosis not present

## 2021-07-11 ENCOUNTER — Ambulatory Visit: Payer: Medicare Other | Admitting: Podiatry

## 2021-07-14 DIAGNOSIS — F0150 Vascular dementia without behavioral disturbance: Secondary | ICD-10-CM | POA: Diagnosis not present

## 2021-07-14 DIAGNOSIS — I10 Essential (primary) hypertension: Secondary | ICD-10-CM | POA: Diagnosis not present

## 2021-07-14 DIAGNOSIS — E559 Vitamin D deficiency, unspecified: Secondary | ICD-10-CM | POA: Diagnosis not present

## 2021-07-14 DIAGNOSIS — I63531 Cerebral infarction due to unspecified occlusion or stenosis of right posterior cerebral artery: Secondary | ICD-10-CM | POA: Diagnosis not present

## 2021-07-14 DIAGNOSIS — E785 Hyperlipidemia, unspecified: Secondary | ICD-10-CM | POA: Diagnosis not present

## 2021-07-20 DIAGNOSIS — E785 Hyperlipidemia, unspecified: Secondary | ICD-10-CM | POA: Diagnosis not present

## 2021-07-20 DIAGNOSIS — K219 Gastro-esophageal reflux disease without esophagitis: Secondary | ICD-10-CM | POA: Diagnosis not present

## 2021-07-20 DIAGNOSIS — K5900 Constipation, unspecified: Secondary | ICD-10-CM | POA: Diagnosis not present

## 2021-07-20 DIAGNOSIS — E559 Vitamin D deficiency, unspecified: Secondary | ICD-10-CM | POA: Diagnosis not present

## 2021-07-20 DIAGNOSIS — I10 Essential (primary) hypertension: Secondary | ICD-10-CM | POA: Diagnosis not present

## 2021-07-26 DIAGNOSIS — E559 Vitamin D deficiency, unspecified: Secondary | ICD-10-CM | POA: Diagnosis not present

## 2021-07-26 DIAGNOSIS — E785 Hyperlipidemia, unspecified: Secondary | ICD-10-CM | POA: Diagnosis not present

## 2021-07-26 DIAGNOSIS — K219 Gastro-esophageal reflux disease without esophagitis: Secondary | ICD-10-CM | POA: Diagnosis not present

## 2021-07-26 DIAGNOSIS — K5900 Constipation, unspecified: Secondary | ICD-10-CM | POA: Diagnosis not present

## 2021-07-26 DIAGNOSIS — I10 Essential (primary) hypertension: Secondary | ICD-10-CM | POA: Diagnosis not present

## 2021-07-28 ENCOUNTER — Non-Acute Institutional Stay: Payer: Medicare Other

## 2021-07-28 ENCOUNTER — Other Ambulatory Visit: Payer: Self-pay

## 2021-07-28 DIAGNOSIS — Z515 Encounter for palliative care: Secondary | ICD-10-CM

## 2021-08-04 NOTE — Progress Notes (Signed)
PATIENT NAME: Claire West DOB: Mar 03, 1941 MRN: [id number]  PRIMARY CARE PROVIDER: Sharilyn Sites, MD  RESPONSIBLE PARTY:  Acct ID - Guarantor Home Phone Work Phone Relationship Acct Type  [id number] [phone]  Self P/F     [place], [place], [place] [address]   COMMUNITY PALLIATIVE CARE RN NOTE    PLAN OF CARE and INTERVENTION:  ADVANCE CARE PLANNING/GOALS OF CARE: Comfort PATIENT/CAREGIVER EDUCATION: Education regarding Palliative care services, safety DISEASE STATUS:  RN Palliative care visit completed today.  Met with patient in her room at [facility]. Patient was in her bed, eating lunch. Patient required encouragement and assistance to eat. Verbalizations were minimal. Needs anticipated. Remains dependent on facility caregivers to meet her needs. Incontinent of Bowel and Bladder. Spoke with Claiborne Billings RN at facility who offered no new concerns.    HISTORY OF PRESENT ILLNESS: This is a 80 year old female with dx including but not limited to Vascular dementia, CVA, hypertension and arthritis. Palliative care has been asked to follow for additional support, goals of care and complex decision making.  CODE STATUS: Full Code PPS: 30%    PHYSICAL EXAM:   Lungs: No audible congestion, LS CTA  Cardiovascular: RRR, No edema, no evidence of chest pain  Skin: Warm, Dry, no visible wounds or rashes Abdomen: No evidence of tenderness, BS positive.        Maxwell Caul RN, BSN    Cornelius Moras, RN

## 2021-08-15 DIAGNOSIS — E785 Hyperlipidemia, unspecified: Secondary | ICD-10-CM | POA: Diagnosis not present

## 2021-08-15 DIAGNOSIS — E559 Vitamin D deficiency, unspecified: Secondary | ICD-10-CM | POA: Diagnosis not present

## 2021-08-15 DIAGNOSIS — I63531 Cerebral infarction due to unspecified occlusion or stenosis of right posterior cerebral artery: Secondary | ICD-10-CM | POA: Diagnosis not present

## 2021-08-15 DIAGNOSIS — F329 Major depressive disorder, single episode, unspecified: Secondary | ICD-10-CM | POA: Diagnosis not present

## 2021-08-15 DIAGNOSIS — I10 Essential (primary) hypertension: Secondary | ICD-10-CM | POA: Diagnosis not present

## 2021-08-17 DIAGNOSIS — E559 Vitamin D deficiency, unspecified: Secondary | ICD-10-CM | POA: Diagnosis not present

## 2021-08-17 DIAGNOSIS — I10 Essential (primary) hypertension: Secondary | ICD-10-CM | POA: Diagnosis not present

## 2021-08-17 DIAGNOSIS — R7989 Other specified abnormal findings of blood chemistry: Secondary | ICD-10-CM | POA: Diagnosis not present

## 2021-08-17 DIAGNOSIS — K5900 Constipation, unspecified: Secondary | ICD-10-CM | POA: Diagnosis not present

## 2021-08-17 DIAGNOSIS — D518 Other vitamin B12 deficiency anemias: Secondary | ICD-10-CM | POA: Diagnosis not present

## 2021-08-17 DIAGNOSIS — K219 Gastro-esophageal reflux disease without esophagitis: Secondary | ICD-10-CM | POA: Diagnosis not present

## 2021-08-17 DIAGNOSIS — E785 Hyperlipidemia, unspecified: Secondary | ICD-10-CM | POA: Diagnosis not present

## 2021-08-23 DIAGNOSIS — R0981 Nasal congestion: Secondary | ICD-10-CM | POA: Diagnosis not present

## 2021-08-23 DIAGNOSIS — K5900 Constipation, unspecified: Secondary | ICD-10-CM | POA: Diagnosis not present

## 2021-08-23 DIAGNOSIS — E785 Hyperlipidemia, unspecified: Secondary | ICD-10-CM | POA: Diagnosis not present

## 2021-08-23 DIAGNOSIS — K219 Gastro-esophageal reflux disease without esophagitis: Secondary | ICD-10-CM | POA: Diagnosis not present

## 2021-08-23 DIAGNOSIS — I10 Essential (primary) hypertension: Secondary | ICD-10-CM | POA: Diagnosis not present

## 2021-08-23 DIAGNOSIS — E559 Vitamin D deficiency, unspecified: Secondary | ICD-10-CM | POA: Diagnosis not present

## 2021-08-26 IMAGING — MG MM  DIGITAL SCREENING BREAST BILAT IMPLANT W/ TOMO W/ CAD
8 of 16 series · 9 of 40 positions shown · non-contrast
Comparison: Previous exam(s).

CLINICAL DATA: Screening.

EXAM:
DIGITAL SCREENING BILATERAL MAMMOGRAM WITH IMPLANTS, CAD AND TOMO
The patient has retroglandular silicone implants. Standard and
implant displaced views were performed.

[R CC synth-2D]
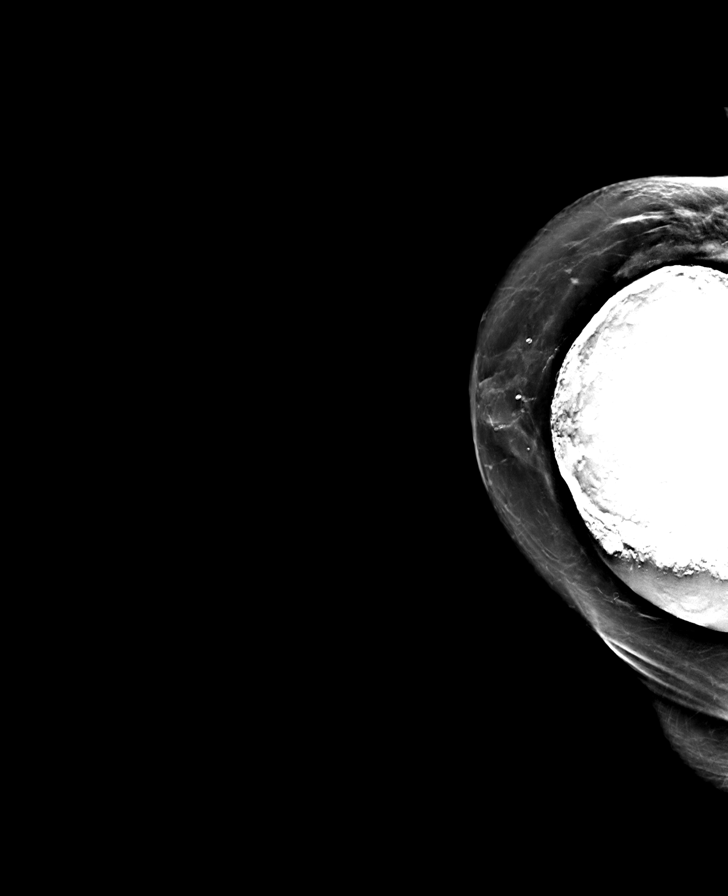

[R MLO synth-2D (1 of 2)]
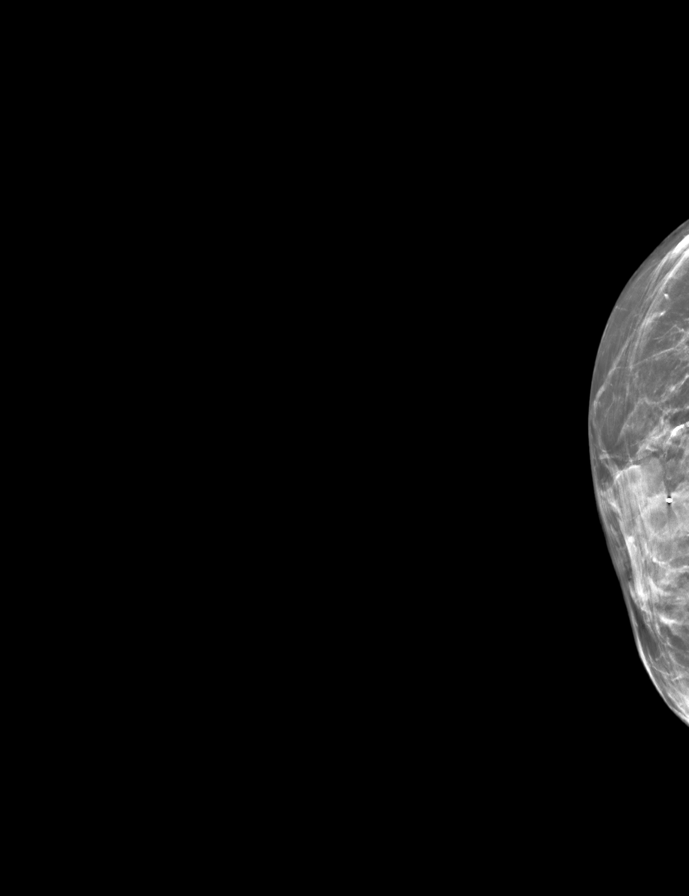

[L CC synth-2D (1 of 2)]
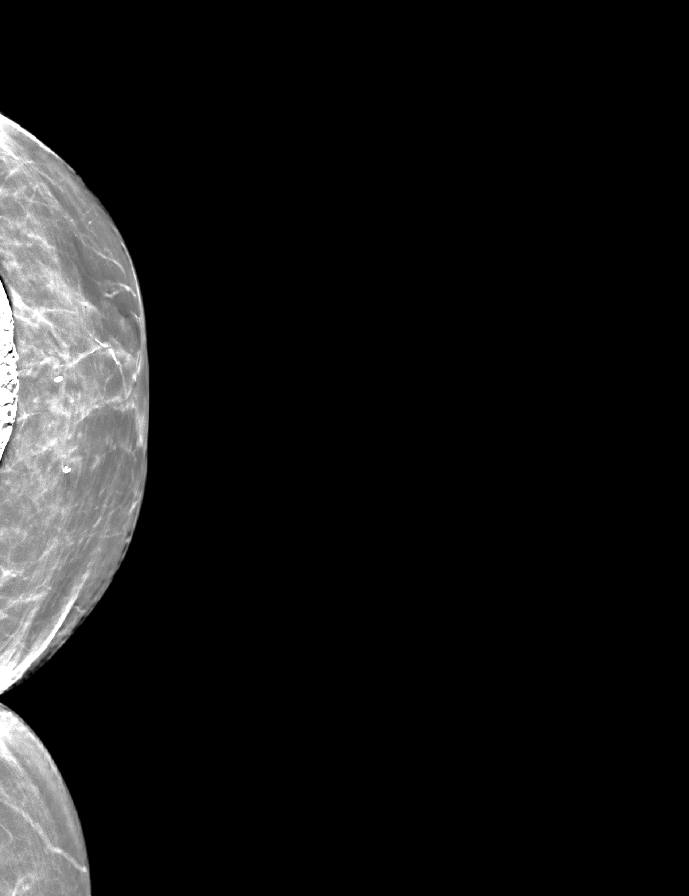

[R MLO synth-2D (2 of 2)]
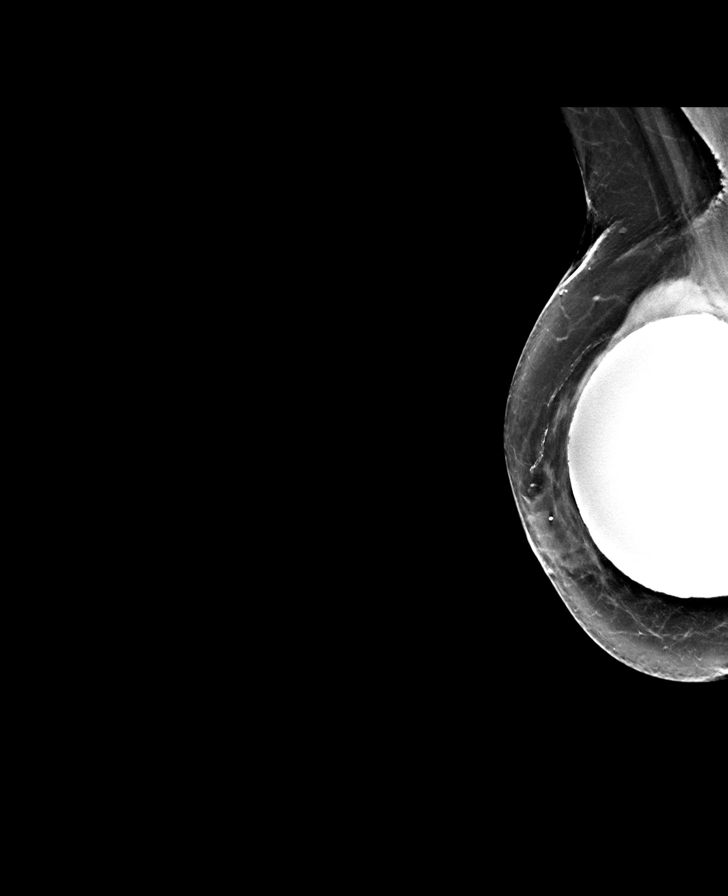

[L MLO synth-2D (1 of 2)]
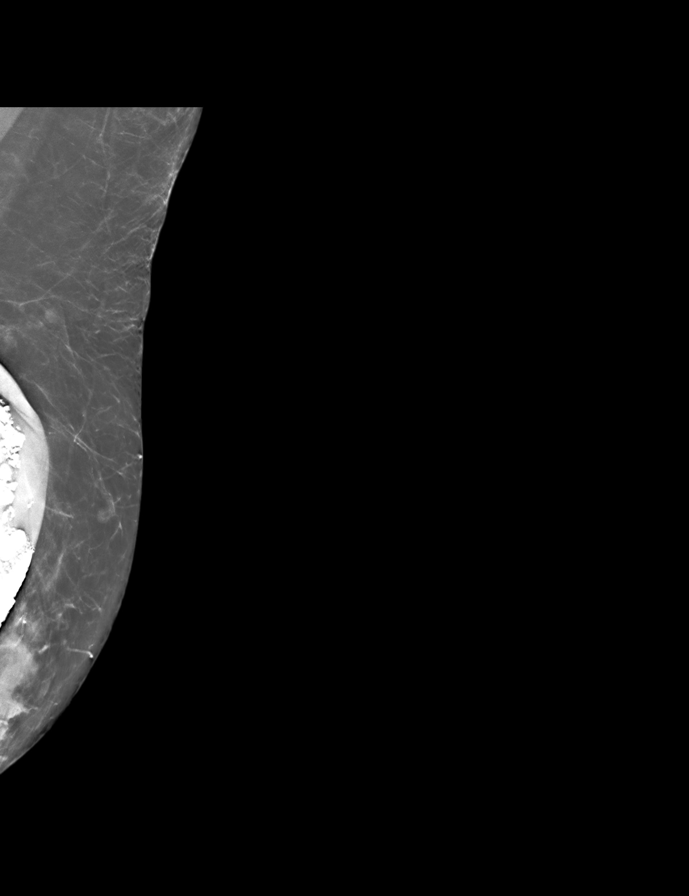

[L CC synth-2D (2 of 2)]
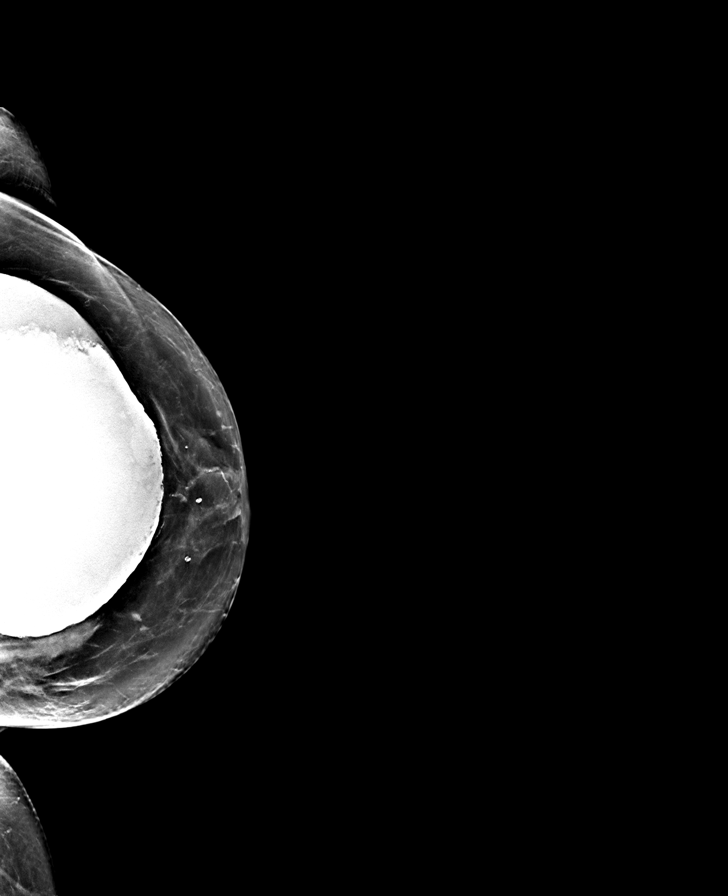

[L MLO synth-2D (2 of 2)]
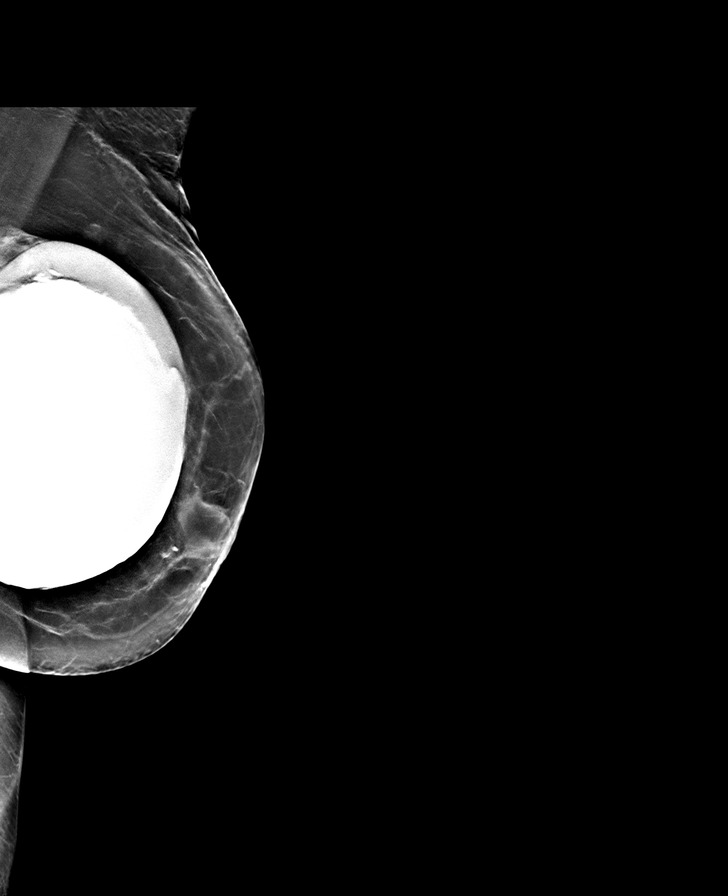

[L CCID BREAST TOMOSYNTHESIS IMAGE tomo · 2 of 54 frames shown]
[frame 18/54]
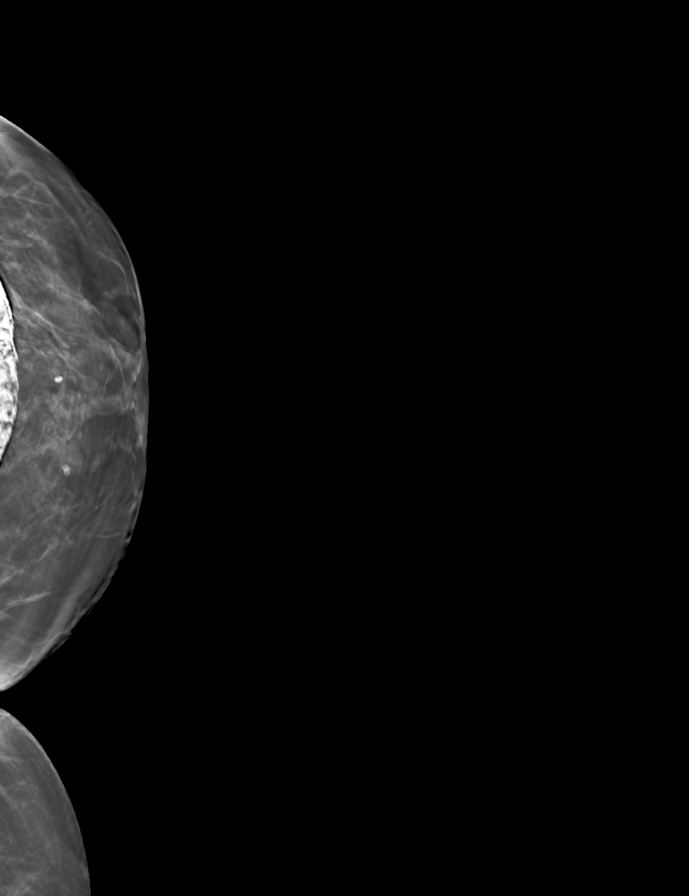
[frame 27/54]
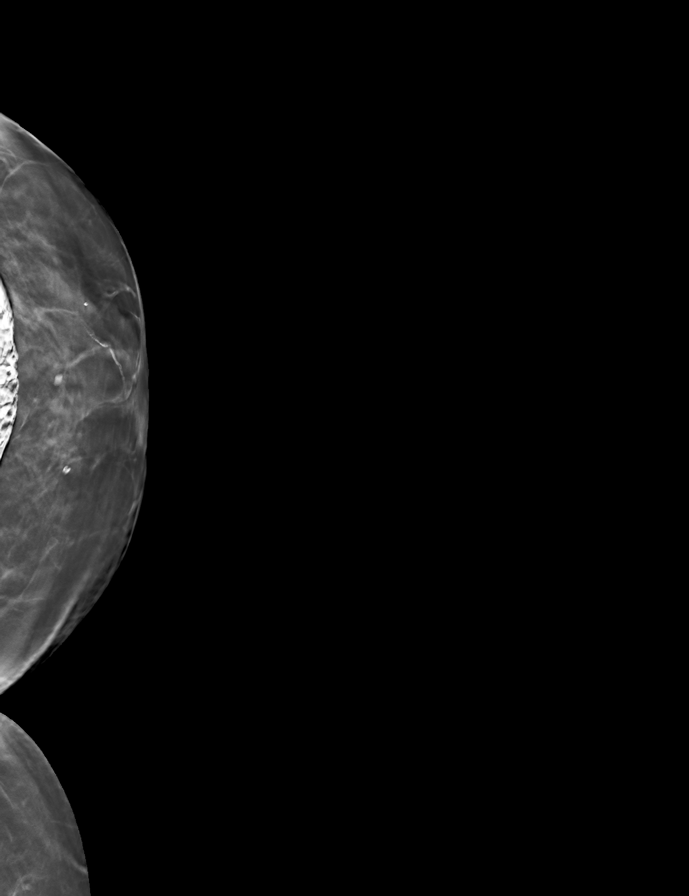

[9 of 40 positions shown; findings below may reference images not displayed]

ACR Breast Density Category b: There are scattered areas of
fibroglandular density.
FINDINGS: There are no findings suspicious for malignancy. There is evidence
of extracapsular rupture of both implants, mildly progressed
compared to the most recent prior study. Images were processed with
CAD.
IMPRESSION: No mammographic evidence of malignancy. A result letter of this
screening mammogram will be mailed directly to the patient.

Extracapsular implant rupture bilaterally.

RECOMMENDATION:
Screening mammogram in one year. (Code:[id number])

BI-RADS CATEGORY  1:  Negative.

## 2021-08-28 DIAGNOSIS — R7989 Other specified abnormal findings of blood chemistry: Secondary | ICD-10-CM | POA: Diagnosis not present

## 2021-09-09 DIAGNOSIS — E785 Hyperlipidemia, unspecified: Secondary | ICD-10-CM | POA: Diagnosis not present

## 2021-09-09 DIAGNOSIS — I63531 Cerebral infarction due to unspecified occlusion or stenosis of right posterior cerebral artery: Secondary | ICD-10-CM | POA: Diagnosis not present

## 2021-09-09 DIAGNOSIS — F329 Major depressive disorder, single episode, unspecified: Secondary | ICD-10-CM | POA: Diagnosis not present

## 2021-09-09 DIAGNOSIS — E559 Vitamin D deficiency, unspecified: Secondary | ICD-10-CM | POA: Diagnosis not present

## 2021-09-09 DIAGNOSIS — F0150 Vascular dementia without behavioral disturbance: Secondary | ICD-10-CM | POA: Diagnosis not present

## 2021-09-09 DIAGNOSIS — I10 Essential (primary) hypertension: Secondary | ICD-10-CM | POA: Diagnosis not present

## 2021-09-14 DIAGNOSIS — I10 Essential (primary) hypertension: Secondary | ICD-10-CM | POA: Diagnosis not present

## 2021-09-14 DIAGNOSIS — E785 Hyperlipidemia, unspecified: Secondary | ICD-10-CM | POA: Diagnosis not present

## 2021-09-14 DIAGNOSIS — K5900 Constipation, unspecified: Secondary | ICD-10-CM | POA: Diagnosis not present

## 2021-09-14 DIAGNOSIS — K219 Gastro-esophageal reflux disease without esophagitis: Secondary | ICD-10-CM | POA: Diagnosis not present

## 2021-09-14 DIAGNOSIS — R0981 Nasal congestion: Secondary | ICD-10-CM | POA: Diagnosis not present

## 2021-09-14 DIAGNOSIS — E559 Vitamin D deficiency, unspecified: Secondary | ICD-10-CM | POA: Diagnosis not present

## 2021-09-19 DIAGNOSIS — N390 Urinary tract infection, site not specified: Secondary | ICD-10-CM | POA: Diagnosis not present

## 2021-09-20 DIAGNOSIS — E559 Vitamin D deficiency, unspecified: Secondary | ICD-10-CM | POA: Diagnosis not present

## 2021-09-20 DIAGNOSIS — K219 Gastro-esophageal reflux disease without esophagitis: Secondary | ICD-10-CM | POA: Diagnosis not present

## 2021-09-20 DIAGNOSIS — E785 Hyperlipidemia, unspecified: Secondary | ICD-10-CM | POA: Diagnosis not present

## 2021-09-20 DIAGNOSIS — N390 Urinary tract infection, site not specified: Secondary | ICD-10-CM | POA: Diagnosis not present

## 2021-09-20 DIAGNOSIS — K5900 Constipation, unspecified: Secondary | ICD-10-CM | POA: Diagnosis not present

## 2021-09-20 DIAGNOSIS — F0150 Vascular dementia without behavioral disturbance: Secondary | ICD-10-CM | POA: Diagnosis not present

## 2021-09-20 DIAGNOSIS — I10 Essential (primary) hypertension: Secondary | ICD-10-CM | POA: Diagnosis not present

## 2021-10-12 DIAGNOSIS — K219 Gastro-esophageal reflux disease without esophagitis: Secondary | ICD-10-CM | POA: Diagnosis not present

## 2021-10-12 DIAGNOSIS — E559 Vitamin D deficiency, unspecified: Secondary | ICD-10-CM | POA: Diagnosis not present

## 2021-10-12 DIAGNOSIS — F0150 Vascular dementia without behavioral disturbance: Secondary | ICD-10-CM | POA: Diagnosis not present

## 2021-10-12 DIAGNOSIS — I10 Essential (primary) hypertension: Secondary | ICD-10-CM | POA: Diagnosis not present

## 2021-10-12 DIAGNOSIS — K5900 Constipation, unspecified: Secondary | ICD-10-CM | POA: Diagnosis not present

## 2021-10-12 DIAGNOSIS — E785 Hyperlipidemia, unspecified: Secondary | ICD-10-CM | POA: Diagnosis not present

## 2021-10-16 DIAGNOSIS — E559 Vitamin D deficiency, unspecified: Secondary | ICD-10-CM | POA: Diagnosis not present

## 2021-10-16 DIAGNOSIS — K5900 Constipation, unspecified: Secondary | ICD-10-CM | POA: Diagnosis not present

## 2021-10-16 DIAGNOSIS — K219 Gastro-esophageal reflux disease without esophagitis: Secondary | ICD-10-CM | POA: Diagnosis not present

## 2021-10-16 DIAGNOSIS — E785 Hyperlipidemia, unspecified: Secondary | ICD-10-CM | POA: Diagnosis not present

## 2021-10-19 DIAGNOSIS — I7091 Generalized atherosclerosis: Secondary | ICD-10-CM | POA: Diagnosis not present

## 2021-10-19 DIAGNOSIS — L603 Nail dystrophy: Secondary | ICD-10-CM | POA: Diagnosis not present

## 2021-10-25 DIAGNOSIS — K5900 Constipation, unspecified: Secondary | ICD-10-CM | POA: Diagnosis not present

## 2021-10-25 DIAGNOSIS — K219 Gastro-esophageal reflux disease without esophagitis: Secondary | ICD-10-CM | POA: Diagnosis not present

## 2021-10-25 DIAGNOSIS — E785 Hyperlipidemia, unspecified: Secondary | ICD-10-CM | POA: Diagnosis not present

## 2021-10-25 DIAGNOSIS — E559 Vitamin D deficiency, unspecified: Secondary | ICD-10-CM | POA: Diagnosis not present

## 2021-10-28 DIAGNOSIS — I63531 Cerebral infarction due to unspecified occlusion or stenosis of right posterior cerebral artery: Secondary | ICD-10-CM | POA: Diagnosis not present

## 2021-10-28 DIAGNOSIS — E559 Vitamin D deficiency, unspecified: Secondary | ICD-10-CM | POA: Diagnosis not present

## 2021-10-28 DIAGNOSIS — I10 Essential (primary) hypertension: Secondary | ICD-10-CM | POA: Diagnosis not present

## 2021-10-28 DIAGNOSIS — F329 Major depressive disorder, single episode, unspecified: Secondary | ICD-10-CM | POA: Diagnosis not present

## 2021-10-28 DIAGNOSIS — E785 Hyperlipidemia, unspecified: Secondary | ICD-10-CM | POA: Diagnosis not present

## 2021-11-09 DIAGNOSIS — E559 Vitamin D deficiency, unspecified: Secondary | ICD-10-CM | POA: Diagnosis not present

## 2021-11-09 DIAGNOSIS — K219 Gastro-esophageal reflux disease without esophagitis: Secondary | ICD-10-CM | POA: Diagnosis not present

## 2021-11-09 DIAGNOSIS — I10 Essential (primary) hypertension: Secondary | ICD-10-CM | POA: Diagnosis not present

## 2021-11-09 DIAGNOSIS — F0150 Vascular dementia without behavioral disturbance: Secondary | ICD-10-CM | POA: Diagnosis not present

## 2021-11-09 DIAGNOSIS — E785 Hyperlipidemia, unspecified: Secondary | ICD-10-CM | POA: Diagnosis not present

## 2021-11-09 DIAGNOSIS — K5900 Constipation, unspecified: Secondary | ICD-10-CM | POA: Diagnosis not present

## 2021-11-11 DIAGNOSIS — I10 Essential (primary) hypertension: Secondary | ICD-10-CM | POA: Diagnosis not present

## 2021-11-11 DIAGNOSIS — E559 Vitamin D deficiency, unspecified: Secondary | ICD-10-CM | POA: Diagnosis not present

## 2021-11-11 DIAGNOSIS — F329 Major depressive disorder, single episode, unspecified: Secondary | ICD-10-CM | POA: Diagnosis not present

## 2021-11-11 DIAGNOSIS — E785 Hyperlipidemia, unspecified: Secondary | ICD-10-CM | POA: Diagnosis not present

## 2021-11-11 DIAGNOSIS — I63531 Cerebral infarction due to unspecified occlusion or stenosis of right posterior cerebral artery: Secondary | ICD-10-CM | POA: Diagnosis not present

## 2021-11-16 DIAGNOSIS — I10 Essential (primary) hypertension: Secondary | ICD-10-CM | POA: Diagnosis not present

## 2021-11-22 DIAGNOSIS — F0150 Vascular dementia without behavioral disturbance: Secondary | ICD-10-CM | POA: Diagnosis not present

## 2021-11-22 DIAGNOSIS — K219 Gastro-esophageal reflux disease without esophagitis: Secondary | ICD-10-CM | POA: Diagnosis not present

## 2021-11-22 DIAGNOSIS — I10 Essential (primary) hypertension: Secondary | ICD-10-CM | POA: Diagnosis not present

## 2021-11-22 DIAGNOSIS — E785 Hyperlipidemia, unspecified: Secondary | ICD-10-CM | POA: Diagnosis not present

## 2021-11-22 DIAGNOSIS — K5900 Constipation, unspecified: Secondary | ICD-10-CM | POA: Diagnosis not present

## 2021-11-27 DIAGNOSIS — K219 Gastro-esophageal reflux disease without esophagitis: Secondary | ICD-10-CM | POA: Diagnosis not present

## 2021-11-27 DIAGNOSIS — I10 Essential (primary) hypertension: Secondary | ICD-10-CM | POA: Diagnosis not present

## 2021-11-27 DIAGNOSIS — K5900 Constipation, unspecified: Secondary | ICD-10-CM | POA: Diagnosis not present

## 2021-11-27 DIAGNOSIS — M549 Dorsalgia, unspecified: Secondary | ICD-10-CM | POA: Diagnosis not present

## 2021-11-27 DIAGNOSIS — E559 Vitamin D deficiency, unspecified: Secondary | ICD-10-CM | POA: Diagnosis not present

## 2021-11-27 DIAGNOSIS — E785 Hyperlipidemia, unspecified: Secondary | ICD-10-CM | POA: Diagnosis not present

## 2021-11-27 DIAGNOSIS — F0150 Vascular dementia without behavioral disturbance: Secondary | ICD-10-CM | POA: Diagnosis not present

## 2021-11-28 DIAGNOSIS — N390 Urinary tract infection, site not specified: Secondary | ICD-10-CM | POA: Diagnosis not present

## 2021-11-28 DIAGNOSIS — M549 Dorsalgia, unspecified: Secondary | ICD-10-CM | POA: Diagnosis not present

## 2021-11-28 DIAGNOSIS — M546 Pain in thoracic spine: Secondary | ICD-10-CM | POA: Diagnosis not present

## 2021-11-28 DIAGNOSIS — M545 Low back pain, unspecified: Secondary | ICD-10-CM | POA: Diagnosis not present

## 2021-11-29 DIAGNOSIS — M549 Dorsalgia, unspecified: Secondary | ICD-10-CM | POA: Diagnosis not present

## 2021-11-29 DIAGNOSIS — E559 Vitamin D deficiency, unspecified: Secondary | ICD-10-CM | POA: Diagnosis not present

## 2021-11-29 DIAGNOSIS — K5900 Constipation, unspecified: Secondary | ICD-10-CM | POA: Diagnosis not present

## 2021-11-29 DIAGNOSIS — K219 Gastro-esophageal reflux disease without esophagitis: Secondary | ICD-10-CM | POA: Diagnosis not present

## 2021-11-29 DIAGNOSIS — E785 Hyperlipidemia, unspecified: Secondary | ICD-10-CM | POA: Diagnosis not present

## 2021-12-04 DIAGNOSIS — I10 Essential (primary) hypertension: Secondary | ICD-10-CM | POA: Diagnosis not present

## 2021-12-04 DIAGNOSIS — G8929 Other chronic pain: Secondary | ICD-10-CM | POA: Diagnosis not present

## 2021-12-04 DIAGNOSIS — E559 Vitamin D deficiency, unspecified: Secondary | ICD-10-CM | POA: Diagnosis not present

## 2021-12-04 DIAGNOSIS — E785 Hyperlipidemia, unspecified: Secondary | ICD-10-CM | POA: Diagnosis not present

## 2021-12-04 DIAGNOSIS — R100 Acute abdomen: Secondary | ICD-10-CM | POA: Diagnosis not present

## 2021-12-04 DIAGNOSIS — M549 Dorsalgia, unspecified: Secondary | ICD-10-CM | POA: Diagnosis not present

## 2021-12-04 DIAGNOSIS — F0150 Vascular dementia without behavioral disturbance: Secondary | ICD-10-CM | POA: Diagnosis not present

## 2021-12-04 DIAGNOSIS — K5900 Constipation, unspecified: Secondary | ICD-10-CM | POA: Diagnosis not present

## 2021-12-04 DIAGNOSIS — K219 Gastro-esophageal reflux disease without esophagitis: Secondary | ICD-10-CM | POA: Diagnosis not present

## 2021-12-06 DIAGNOSIS — N390 Urinary tract infection, site not specified: Secondary | ICD-10-CM | POA: Diagnosis not present

## 2021-12-07 DIAGNOSIS — I10 Essential (primary) hypertension: Secondary | ICD-10-CM | POA: Diagnosis not present

## 2021-12-07 DIAGNOSIS — M549 Dorsalgia, unspecified: Secondary | ICD-10-CM | POA: Diagnosis not present

## 2021-12-07 DIAGNOSIS — E559 Vitamin D deficiency, unspecified: Secondary | ICD-10-CM | POA: Diagnosis not present

## 2021-12-07 DIAGNOSIS — K5900 Constipation, unspecified: Secondary | ICD-10-CM | POA: Diagnosis not present

## 2021-12-07 DIAGNOSIS — K219 Gastro-esophageal reflux disease without esophagitis: Secondary | ICD-10-CM | POA: Diagnosis not present

## 2021-12-07 DIAGNOSIS — E785 Hyperlipidemia, unspecified: Secondary | ICD-10-CM | POA: Diagnosis not present

## 2021-12-07 DIAGNOSIS — G8929 Other chronic pain: Secondary | ICD-10-CM | POA: Diagnosis not present

## 2021-12-08 ENCOUNTER — Encounter: Payer: Self-pay | Admitting: Family Medicine

## 2021-12-08 ENCOUNTER — Other Ambulatory Visit: Payer: Self-pay

## 2021-12-08 ENCOUNTER — Non-Acute Institutional Stay: Payer: Medicare Other | Admitting: Family Medicine

## 2021-12-08 VITALS — BP 172/86 | HR 82 | Temp 96.8°F | Resp 16

## 2021-12-08 DIAGNOSIS — G3183 Dementia with Lewy bodies: Secondary | ICD-10-CM | POA: Diagnosis not present

## 2021-12-08 DIAGNOSIS — I10 Essential (primary) hypertension: Secondary | ICD-10-CM | POA: Diagnosis not present

## 2021-12-08 DIAGNOSIS — Z515 Encounter for palliative care: Secondary | ICD-10-CM

## 2021-12-08 DIAGNOSIS — F0150 Vascular dementia without behavioral disturbance: Secondary | ICD-10-CM | POA: Diagnosis not present

## 2021-12-08 DIAGNOSIS — R399 Unspecified symptoms and signs involving the genitourinary system: Secondary | ICD-10-CM

## 2021-12-08 HISTORY — DX: Unspecified symptoms and signs involving the genitourinary system: R39.9

## 2021-12-08 NOTE — Progress Notes (Signed)
? ? ?Manufacturing engineer ?Community Palliative Care Consult Note ?Telephone: [phone]  ?Fax: [phone]  ? ? ?Date of encounter: 12/08/21 ?9:30 AM ?PATIENT NAME: Claire West ?[address] ?[place] [address]   ?[phone] (home)  ?DOB: [birth year] ?MRN: [id number] ?PRIMARY CARE PROVIDER:    ?Claire West, Claire West,  ?[address] ?[place] [address] ?[phone] ? ?REFERRING PROVIDER:   ?Claire West, Claire West ?[address] ?[place],  [place] [address] ?[phone] ? ?RESPONSIBLE PARTY:    ?Contact Information   ? ? Name Relation Home Work Mobile  ? Claire West Sister [phone]  [phone]  ? Claire West Son   [phone]  ? Claire West   [phone]  ? ?  ? ? ? ?I met face to face with patient in her skilled facility. Palliative Care was asked to follow this patient by consultation request of  Claire West, Claire West to address advance care planning and complex medical decision making. This is a follow up visit. ? ?                                 ASSESSMENT , SYMPTOM MANAGEMENT AND PLAN / RECOMMENDATIONS:  ? Palliative Care Encounter ?Communicate with [facility] POA to determine goals of care/code status. ? ?2.  Primary Hypertension ?Not well controlled. ?Agree with continuation of Olmesartan 40 mg daily, Metoprolol XL 50 mg BID. ?Recommend changing Amlodipine 10 mg from daily to HS administration ? ?3.  UTI symptoms ?Urine culture in progress. ?Clinically stable, await culture results to treat and if antibiotics needed would recommend continuation or probiotics. ?Encourage increased fluid intake. ? ?4.  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  If pt not sleeping or has agitation, consider addition of Seroquel 25 mg QHS. ?Address goals of care ? ? ?Advance Care Planning/Goals of Care: Goals include to maximize quality of life and symptom management.   ?Pt has a [place] [facility] West as her designated [facility] POA followed by Claire West. ?The Health Care POA designates the authority to withhold or withdraw life-sustaining procedures in cases where she is terminally ill, permanently comatose, suffer severe dementia or a persistent vegetative state and include things like mechanical ventilation, dialysis, antibiotics, and artificial nutrition/hydration. ? ?CODE STATUS: ?Currently full code ? ? ? ?Follow up Palliative Care Visit: Palliative care will continue to follow for complex medical decision making, advance care planning, and clarification of goals. Return 4 weeks or prn. ? ? ?This visit was coded based on medical decision making (MDM). ? ?PPS: 30% ? ?HOSPICE ELIGIBILITY/DIAGNOSIS: TBD ? ?Chief Complaint:  ?[facility] is following up with patient for chronic disease management with diagnosis of Mixed Lewy body and vascular dementia and defining/refining goals of care. ? ? ?HISTORY OF PRESENT ILLNESS:  Claire West is a 81 y.o. year old female with diagnosis of Mixed Lewy body and vascular dementia.  Nursing states that she has had a UA concerning for possible infection and they are awaiting urine culture and sensitivity.  Reports recent BP elevation with changes in antihypertensive medications to improve control. Pt has good appetite, is total care for ADLs except feeding self and is incontinent of bowel and bladder.  She largely is bed or chairbound. Aid notes no issues with cough or choking after eating or drinking.  Pt  denies fever, nausea, vomiting or dysuria.  Given her advanced dementia she is not a completely reliable historian.  Information obtained from nurse and [facility] West home staff.  ? ?History obtained from review of EMR, discussion with primary team, and interview with facility staff/caregiver and/or Claire West.  ?I reviewed available labs, medications, imaging, studies and  related documents from the EMR.  Records reviewed and summarized above.  ? ?ROS ?Limited to above given advanced dementia ? ?Physical Exam: ?Current and past weights: Last weight on 11/15/21 was 156 lbs (stable with prior documented weight of 148 lbs 06/09/20) ?Constitutional: NAD ?General: WNWD, obese  ?EYES: anicteric sclera, lids intact, no discharge  ?ENMT: intact hearing, oral mucous membranes moist, dentition intact ?CV: S1S2, RRR with LUSB SM, no LE edema ?Pulmonary: CTAB no increased work of breathing, no cough, room air ?Abdomen: inormo-active BS + 4 quadrants, soft and non tender, no ascites ?GU: deferred ?MSK: no sarcopenia, moves all extremities, bed or chair bound ?Skin: warm and dry, no rashes or wounds on visible skin ?Neuro:  no generalized weakness, noted impairment ?Psych: non-anxious affect, A and O x 2 ?Hem/lymph/immuno: no widespread bruising ? ? ?Thank you for the opportunity to participate in the care of [name].  The palliative care team will continue to follow. Please call our office at [phone] if we can be of additional assistance.  ? ?Claire West, Claire West ? ?COVID-19 PATIENT SCREENING TOOL ?Asked and negative response unless otherwise noted:  ? ?Have you had symptoms of covid, tested positive or been in contact with someone with symptoms/positive test in the past 5-10 days?  Unknown ?

## 2021-12-09 DIAGNOSIS — E559 Vitamin D deficiency, unspecified: Secondary | ICD-10-CM | POA: Diagnosis not present

## 2021-12-09 DIAGNOSIS — F329 Major depressive disorder, single episode, unspecified: Secondary | ICD-10-CM | POA: Diagnosis not present

## 2021-12-09 DIAGNOSIS — N390 Urinary tract infection, site not specified: Secondary | ICD-10-CM | POA: Diagnosis not present

## 2021-12-09 DIAGNOSIS — I10 Essential (primary) hypertension: Secondary | ICD-10-CM | POA: Diagnosis not present

## 2021-12-09 DIAGNOSIS — I63531 Cerebral infarction due to unspecified occlusion or stenosis of right posterior cerebral artery: Secondary | ICD-10-CM | POA: Diagnosis not present

## 2021-12-09 DIAGNOSIS — E785 Hyperlipidemia, unspecified: Secondary | ICD-10-CM | POA: Diagnosis not present

## 2021-12-10 ENCOUNTER — Emergency Department (HOSPITAL_COMMUNITY): Payer: Medicare Other

## 2021-12-10 ENCOUNTER — Encounter (HOSPITAL_COMMUNITY): Payer: Self-pay

## 2021-12-10 ENCOUNTER — Inpatient Hospital Stay (HOSPITAL_COMMUNITY)
Admission: EM | Admit: 2021-12-10 | Discharge: 2021-12-14 | DRG: 871 | Disposition: A | Discharge status: Skilled Nursing Facility | Payer: Medicare Other | Source: Skilled Nursing Facility | Attending: Internal Medicine | Admitting: Internal Medicine

## 2021-12-10 ENCOUNTER — Other Ambulatory Visit: Payer: Self-pay

## 2021-12-10 DIAGNOSIS — E876 Hypokalemia: Secondary | ICD-10-CM | POA: Diagnosis present

## 2021-12-10 DIAGNOSIS — Z7401 Bed confinement status: Secondary | ICD-10-CM | POA: Diagnosis not present

## 2021-12-10 DIAGNOSIS — G9341 Metabolic encephalopathy: Secondary | ICD-10-CM | POA: Diagnosis not present

## 2021-12-10 DIAGNOSIS — R6520 Severe sepsis without septic shock: Secondary | ICD-10-CM | POA: Diagnosis present

## 2021-12-10 DIAGNOSIS — E872 Acidosis, unspecified: Secondary | ICD-10-CM | POA: Diagnosis present

## 2021-12-10 DIAGNOSIS — J90 Pleural effusion, not elsewhere classified: Secondary | ICD-10-CM | POA: Diagnosis not present

## 2021-12-10 DIAGNOSIS — R6889 Other general symptoms and signs: Secondary | ICD-10-CM | POA: Diagnosis not present

## 2021-12-10 DIAGNOSIS — I6622 Occlusion and stenosis of left posterior cerebral artery: Secondary | ICD-10-CM | POA: Diagnosis not present

## 2021-12-10 DIAGNOSIS — Z8673 Personal history of transient ischemic attack (TIA), and cerebral infarction without residual deficits: Secondary | ICD-10-CM | POA: Diagnosis not present

## 2021-12-10 DIAGNOSIS — Z20822 Contact with and (suspected) exposure to covid-19: Secondary | ICD-10-CM | POA: Diagnosis present

## 2021-12-10 DIAGNOSIS — K6389 Other specified diseases of intestine: Secondary | ICD-10-CM | POA: Diagnosis not present

## 2021-12-10 DIAGNOSIS — I69354 Hemiplegia and hemiparesis following cerebral infarction affecting left non-dominant side: Secondary | ICD-10-CM

## 2021-12-10 DIAGNOSIS — E871 Hypo-osmolality and hyponatremia: Secondary | ICD-10-CM | POA: Diagnosis present

## 2021-12-10 DIAGNOSIS — Z801 Family history of malignant neoplasm of trachea, bronchus and lung: Secondary | ICD-10-CM | POA: Diagnosis not present

## 2021-12-10 DIAGNOSIS — N61 Mastitis without abscess: Secondary | ICD-10-CM | POA: Diagnosis present

## 2021-12-10 DIAGNOSIS — R0689 Other abnormalities of breathing: Secondary | ICD-10-CM | POA: Diagnosis not present

## 2021-12-10 DIAGNOSIS — Z743 Need for continuous supervision: Secondary | ICD-10-CM | POA: Diagnosis not present

## 2021-12-10 DIAGNOSIS — F0150 Vascular dementia without behavioral disturbance: Secondary | ICD-10-CM | POA: Diagnosis present

## 2021-12-10 DIAGNOSIS — R7401 Elevation of levels of liver transaminase levels: Secondary | ICD-10-CM | POA: Diagnosis not present

## 2021-12-10 DIAGNOSIS — K219 Gastro-esophageal reflux disease without esophagitis: Secondary | ICD-10-CM | POA: Diagnosis present

## 2021-12-10 DIAGNOSIS — R5383 Other fatigue: Secondary | ICD-10-CM | POA: Diagnosis not present

## 2021-12-10 DIAGNOSIS — R4182 Altered mental status, unspecified: Secondary | ICD-10-CM | POA: Diagnosis not present

## 2021-12-10 DIAGNOSIS — G928 Other toxic encephalopathy: Secondary | ICD-10-CM | POA: Diagnosis present

## 2021-12-10 DIAGNOSIS — Z85828 Personal history of other malignant neoplasm of skin: Secondary | ICD-10-CM

## 2021-12-10 DIAGNOSIS — A419 Sepsis, unspecified organism: Secondary | ICD-10-CM

## 2021-12-10 DIAGNOSIS — G3183 Dementia with Lewy bodies: Secondary | ICD-10-CM | POA: Diagnosis present

## 2021-12-10 DIAGNOSIS — K7689 Other specified diseases of liver: Secondary | ICD-10-CM | POA: Diagnosis not present

## 2021-12-10 DIAGNOSIS — Z90710 Acquired absence of both cervix and uterus: Secondary | ICD-10-CM | POA: Diagnosis not present

## 2021-12-10 DIAGNOSIS — N200 Calculus of kidney: Secondary | ICD-10-CM | POA: Diagnosis not present

## 2021-12-10 DIAGNOSIS — I82512 Chronic embolism and thrombosis of left femoral vein: Secondary | ICD-10-CM | POA: Diagnosis present

## 2021-12-10 DIAGNOSIS — F0280 Dementia in other diseases classified elsewhere without behavioral disturbance: Secondary | ICD-10-CM | POA: Diagnosis present

## 2021-12-10 DIAGNOSIS — R509 Fever, unspecified: Secondary | ICD-10-CM

## 2021-12-10 DIAGNOSIS — D72829 Elevated white blood cell count, unspecified: Secondary | ICD-10-CM | POA: Diagnosis not present

## 2021-12-10 DIAGNOSIS — I517 Cardiomegaly: Secondary | ICD-10-CM | POA: Diagnosis not present

## 2021-12-10 DIAGNOSIS — R7402 Elevation of levels of lactic acid dehydrogenase (LDH): Secondary | ICD-10-CM | POA: Diagnosis not present

## 2021-12-10 DIAGNOSIS — E785 Hyperlipidemia, unspecified: Secondary | ICD-10-CM | POA: Diagnosis present

## 2021-12-10 DIAGNOSIS — N390 Urinary tract infection, site not specified: Secondary | ICD-10-CM | POA: Diagnosis present

## 2021-12-10 DIAGNOSIS — Z993 Dependence on wheelchair: Secondary | ICD-10-CM | POA: Diagnosis not present

## 2021-12-10 DIAGNOSIS — R404 Transient alteration of awareness: Secondary | ICD-10-CM | POA: Diagnosis not present

## 2021-12-10 DIAGNOSIS — I10 Essential (primary) hypertension: Secondary | ICD-10-CM | POA: Diagnosis not present

## 2021-12-10 DIAGNOSIS — N3289 Other specified disorders of bladder: Secondary | ICD-10-CM | POA: Diagnosis not present

## 2021-12-10 DIAGNOSIS — R1110 Vomiting, unspecified: Secondary | ICD-10-CM | POA: Diagnosis not present

## 2021-12-10 DIAGNOSIS — I499 Cardiac arrhythmia, unspecified: Secondary | ICD-10-CM | POA: Diagnosis not present

## 2021-12-10 DIAGNOSIS — J9811 Atelectasis: Secondary | ICD-10-CM | POA: Diagnosis not present

## 2021-12-10 DIAGNOSIS — R7989 Other specified abnormal findings of blood chemistry: Secondary | ICD-10-CM | POA: Diagnosis not present

## 2021-12-10 HISTORY — DX: Sepsis, unspecified organism: A41.9

## 2021-12-10 LAB — RAPID URINE DRUG SCREEN, HOSP PERFORMED
Amphetamines: NOT DETECTED
Barbiturates: NOT DETECTED
Benzodiazepines: NOT DETECTED
Cocaine: NOT DETECTED
Opiates: NOT DETECTED
Tetrahydrocannabinol: NOT DETECTED

## 2021-12-10 LAB — COMPREHENSIVE METABOLIC PANEL
ALT: 82 U/L — ABNORMAL HIGH (ref 0–44)
AST: 117 U/L — ABNORMAL HIGH (ref 15–41)
Albumin: 3.5 g/dL (ref 3.5–5.0)
Alkaline Phosphatase: 94 U/L (ref 38–126)
Anion gap: 15 (ref 5–15)
BUN: 14 mg/dL (ref 8–23)
CO2: 20 mmol/L — ABNORMAL LOW (ref 22–32)
Calcium: 9.3 mg/dL (ref 8.9–10.3)
Chloride: 103 mmol/L (ref 98–111)
Creatinine, Ser: 0.84 mg/dL (ref 0.44–1.00)
GFR, Estimated: >60 mL/min (ref >60)
Glucose, Bld: 120 mg/dL — ABNORMAL HIGH (ref 70–99)
Potassium: 3.8 mmol/L (ref 3.5–5.1)
Sodium: 138 mmol/L (ref 135–145)
Total Bilirubin: 2.3 mg/dL — ABNORMAL HIGH (ref 0.3–1.2)
Total Protein: 7.2 g/dL (ref 6.5–8.1)

## 2021-12-10 LAB — CBC WITH DIFFERENTIAL/PLATELET
Abs Immature Granulocytes: 0.12 10*3/uL — ABNORMAL HIGH (ref 0.00–0.07)
Basophils Absolute: 0.1 10*3/uL (ref 0.0–0.1)
Basophils Relative: 1 %
Eosinophils Absolute: 0.1 10*3/uL (ref 0.0–0.5)
Eosinophils Relative: 1 %
HCT: 47.9 % — ABNORMAL HIGH (ref 36.0–46.0)
Hemoglobin: 16.1 g/dL — ABNORMAL HIGH (ref 12.0–15.0)
Immature Granulocytes: 1 %
Lymphocytes Relative: 1 %
Lymphs Abs: 0.1 10*3/uL — ABNORMAL LOW (ref 0.7–4.0)
MCH: 32 pg (ref 26.0–34.0)
MCHC: 33.6 g/dL (ref 30.0–36.0)
MCV: 95.2 fL (ref 80.0–100.0)
Monocytes Absolute: 0.3 10*3/uL (ref 0.1–1.0)
Monocytes Relative: 2 %
Neutro Abs: 14.7 10*3/uL — ABNORMAL HIGH (ref 1.7–7.7)
Neutrophils Relative %: 94 %
Platelets: 221 10*3/uL (ref 150–400)
RBC: 5.03 MIL/uL (ref 3.87–5.11)
RDW: 12.7 % (ref 11.5–15.5)
WBC: 15.5 10*3/uL — ABNORMAL HIGH (ref 4.0–10.5)
nRBC: 0 % (ref 0.0–0.2)

## 2021-12-10 LAB — CBG MONITORING, ED: Glucose-Capillary: 108 mg/dL — ABNORMAL HIGH (ref 70–99)

## 2021-12-10 LAB — TROPONIN I (HIGH SENSITIVITY): Troponin I (High Sensitivity): 11 ng/L (ref <18)

## 2021-12-10 LAB — RESP PANEL BY RT-PCR (FLU A&B, COVID) ARPGX2
Influenza A by PCR: NEGATIVE
Influenza B by PCR: NEGATIVE
SARS Coronavirus 2 by RT PCR: NEGATIVE

## 2021-12-10 LAB — URINALYSIS, ROUTINE W REFLEX MICROSCOPIC
Bacteria, UA: NONE SEEN
Bilirubin Urine: NEGATIVE
Glucose, UA: NEGATIVE mg/dL
Ketones, ur: NEGATIVE mg/dL
Leukocytes,Ua: NEGATIVE
Nitrite: NEGATIVE
Protein, ur: NEGATIVE mg/dL
RBC / HPF: >50 RBC/hpf — ABNORMAL HIGH (ref 0–5)
Specific Gravity, Urine: 1.011 (ref 1.005–1.030)
pH: 9 — ABNORMAL HIGH (ref 5.0–8.0)

## 2021-12-10 LAB — I-STAT VENOUS BLOOD GAS, ED
Acid-Base Excess: 0 mmol/L (ref 0.0–2.0)
Bicarbonate: 22.7 mmol/L (ref 20.0–28.0)
Calcium, Ion: 1.13 mmol/L — ABNORMAL LOW (ref 1.15–1.40)
HCT: 46 % (ref 36.0–46.0)
Hemoglobin: 15.6 g/dL — ABNORMAL HIGH (ref 12.0–15.0)
O2 Saturation: 91 %
Potassium: 3.8 mmol/L (ref 3.5–5.1)
Sodium: 139 mmol/L (ref 135–145)
TCO2: 24 mmol/L (ref 22–32)
pCO2, Ven: 29.8 mmHg — ABNORMAL LOW (ref 44–60)
pH, Ven: 7.489 — ABNORMAL HIGH (ref 7.25–7.43)
pO2, Ven: 54 mmHg — ABNORMAL HIGH (ref 32–45)

## 2021-12-10 LAB — CSF CELL COUNT WITH DIFFERENTIAL
RBC Count, CSF: 1 /mm3 — ABNORMAL HIGH
Tube #: 3
WBC, CSF: 2 /mm3 (ref 0–5)

## 2021-12-10 LAB — I-STAT CHEM 8, ED
BUN: 16 mg/dL (ref 8–23)
Calcium, Ion: 1.13 mmol/L — ABNORMAL LOW (ref 1.15–1.40)
Chloride: 105 mmol/L (ref 98–111)
Creatinine, Ser: 0.5 mg/dL (ref 0.44–1.00)
Glucose, Bld: 115 mg/dL — ABNORMAL HIGH (ref 70–99)
HCT: 47 % — ABNORMAL HIGH (ref 36.0–46.0)
Hemoglobin: 16 g/dL — ABNORMAL HIGH (ref 12.0–15.0)
Potassium: 3.8 mmol/L (ref 3.5–5.1)
Sodium: 139 mmol/L (ref 135–145)
TCO2: 22 mmol/L (ref 22–32)

## 2021-12-10 LAB — LACTIC ACID, PLASMA
Lactic Acid, Venous: 4.1 mmol/L (ref 0.5–1.9)
Lactic Acid, Venous: 5.4 mmol/L (ref 0.5–1.9)

## 2021-12-10 LAB — ACETAMINOPHEN LEVEL: Acetaminophen (Tylenol), Serum: <10 ug/mL — ABNORMAL LOW (ref 10–30)

## 2021-12-10 LAB — PROTEIN AND GLUCOSE, CSF
Glucose, CSF: 59 mg/dL (ref 40–70)
Total  Protein, CSF: 51 mg/dL — ABNORMAL HIGH (ref 15–45)

## 2021-12-10 LAB — BRAIN NATRIURETIC PEPTIDE: B Natriuretic Peptide: 506.7 pg/mL — ABNORMAL HIGH (ref 0.0–100.0)

## 2021-12-10 LAB — SALICYLATE LEVEL: Salicylate Lvl: <7 mg/dL — ABNORMAL LOW (ref 7.0–30.0)

## 2021-12-10 LAB — PROTIME-INR
INR: 1.2 (ref 0.8–1.2)
Prothrombin Time: 15 seconds (ref 11.4–15.2)

## 2021-12-10 LAB — D-DIMER, QUANTITATIVE: D-Dimer, Quant: 7.31 ug/mL-FEU — ABNORMAL HIGH (ref 0.00–0.50)

## 2021-12-10 LAB — APTT: aPTT: 31 seconds (ref 24–36)

## 2021-12-10 MED ORDER — IOHEXOL 300 MG/ML  SOLN
75.0000 mL | Freq: Once | INTRAMUSCULAR | Status: AC | PRN
  Administered 2021-12-10: 75 mL via INTRAVENOUS

## 2021-12-10 MED ORDER — LACTATED RINGERS IV BOLUS (SEPSIS)
1000.0000 mL | Freq: Once | INTRAVENOUS | Status: AC
  Administered 2021-12-10: 1000 mL via INTRAVENOUS

## 2021-12-10 MED ORDER — LACTATED RINGERS IV SOLN: INTRAVENOUS | Status: DC

## 2021-12-10 MED ORDER — ACETAMINOPHEN 650 MG RE SUPP
650.0000 mg | Freq: Once | RECTAL | Status: AC
  Administered 2021-12-10: 650 mg via RECTAL
  Filled 2021-12-10: qty 1

## 2021-12-10 MED ORDER — VANCOMYCIN HCL 500 MG/100ML IV SOLN
500.0000 mg | Freq: Two times a day (BID) | INTRAVENOUS | Status: DC
  Administered 2021-12-11: 500 mg via INTRAVENOUS
  Filled 2021-12-10: qty 100

## 2021-12-10 MED ORDER — SODIUM CHLORIDE 0.9 % IV SOLN
2.0000 g | Freq: Once | INTRAVENOUS | Status: AC
  Administered 2021-12-10: 2 g via INTRAVENOUS
  Filled 2021-12-10: qty 20

## 2021-12-10 MED ORDER — VANCOMYCIN HCL IN DEXTROSE 1-5 GM/200ML-% IV SOLN
1000.0000 mg | Freq: Once | INTRAVENOUS | Status: AC
  Administered 2021-12-10: 1000 mg via INTRAVENOUS
  Filled 2021-12-10: qty 200

## 2021-12-10 MED ORDER — SODIUM CHLORIDE 0.9 % IV SOLN
2.0000 g | Freq: Two times a day (BID) | INTRAVENOUS | Status: DC
  Administered 2021-12-11 – 2021-12-12 (×3): 2 g via INTRAVENOUS
  Filled 2021-12-10 (×4): qty 20

## 2021-12-10 MED ORDER — SODIUM CHLORIDE 0.9 % IV SOLN: 2.0000 g | Freq: Once | INTRAVENOUS | Status: DC

## 2021-12-10 MED ORDER — IOHEXOL 350 MG/ML SOLN
50.0000 mL | Freq: Once | INTRAVENOUS | Status: AC | PRN
  Administered 2021-12-10: 50 mL via INTRAVENOUS

## 2021-12-10 MED ORDER — ACETAMINOPHEN 325 MG PO TABS
650.0000 mg | ORAL_TABLET | Freq: Four times a day (QID) | ORAL | Status: DC | PRN
  Administered 2021-12-13 – 2021-12-14 (×2): 650 mg via ORAL
  Filled 2021-12-10 (×3): qty 2

## 2021-12-10 MED ORDER — METRONIDAZOLE 500 MG/100ML IV SOLN
500.0000 mg | Freq: Once | INTRAVENOUS | Status: AC
  Administered 2021-12-10: 500 mg via INTRAVENOUS
  Filled 2021-12-10: qty 100

## 2021-12-10 MED ORDER — ACETAMINOPHEN 650 MG RE SUPP: 650.0000 mg | Freq: Four times a day (QID) | RECTAL | Status: DC | PRN

## 2021-12-10 NOTE — ED Triage Notes (Signed)
Pt bib ems from [facility] c/o AMS. Pt was dx UTI two days ago. Ems stated pt received two doses of Macrobid. Staff noticed within  the last 24 hours pt has been more lethargic and not acting like her self. Pt responds to name and pain. EMS stated baseline pt usually able to answer questions and follow commands.  ? ?[place] 10 ?HR 110 ?RR 30 ?BP 190/100 ?O2 96% 2L baseline room air ?CBG 149 ?Temp 98.8 ?

## 2021-12-10 NOTE — Sepsis Progress Note (Signed)
Elink following code sepsis °

## 2021-12-10 NOTE — ED Notes (Signed)
RN verified with microlab that they received blood cultures. ? ?RN called lab to add BNP. ?

## 2021-12-10 NOTE — ED Provider Notes (Addendum)
[facility] DEPART[facility] Provider Note   CSN: [id number] Arrival date & time: 12/10/21  1433     History  Chief Complaint  Patient presents with   Altered Mental Status    Claire West is a 81 y.o. female.   Altered Mental Status    81 year old female with a history of HTN, HLD, GERD, acute metabolic encephalopathy, Lewy body dementia, prior CVA with residual left-sided deficits presenting to the emergency department with altered mental status.  The patient was reportedly diagnosed with a urinary tract infection 2 days ago and started on Macrobid.  She has received 2 doses of Macrobid.  In the last 24 hours, the patient was notably more lethargic and not acting like herself.  Per family, baseline mental status is alert and oriented to person and able to hold a conversation.  She has been less talkative.  She is normally able to follow commands and answer questions.  Family noted no new neurologic deficits over the last 24 hours.  She was febrile at her facility.   Home Medications Prior to Admission medications   Medication Sig Start Date End Date Taking? Authorizing Provider  acetaminophen (TYLENOL) 325 MG tablet Take 650 mg by mouth 3 (three) times daily.   Yes [provider]  amLODipine (NORVASC) 10 MG tablet Take 10 mg by mouth daily. 12/04/21  Yes [provider]  ARTIFICIAL TEAR OP Place 2 drops into both eyes in the morning and at bedtime.   Yes [provider]  aspirin EC 81 MG tablet Take 81 mg by mouth every morning.    Yes [provider]  busPIRone (BUSPAR) 5 MG tablet Take 5 mg by mouth 2 (two) times daily. 03/12/21  Yes [provider]  calcium carbonate (TUMS - DOSED IN MG ELEMENTAL CALCIUM) 500 MG chewable tablet Chew 1 tablet by mouth daily.   Yes [provider]  cholecalciferol (VITAMIN D) 25 MCG (1000 UNIT) tablet Take 1,000 Units by mouth daily at 12 noon.   Yes [provider]  escitalopram (LEXAPRO) 20 MG tablet Take 20 mg by mouth daily. 03/07/18  Yes [provider]  famotidine (PEPCID) 20 MG tablet Take 20 mg by mouth every evening. 11/29/21  Yes [provider]  fexofenadine (ALLEGRA) 180 MG tablet Take 180 mg by mouth daily as needed for allergies or rhinitis.   Yes [provider]  HYDROcodone-acetaminophen (NORCO/VICODIN) 5-325 MG tablet Take 1 tablet by mouth 2 (two) times daily as needed for moderate pain.   Yes [provider]  LACTOBACILLUS PO Take 10 Billion Cells by mouth daily.   Yes [provider]  magnesium oxide (MAG-OX) 400 MG tablet Take 400 mg by mouth at bedtime.   Yes [provider]  Menthol, Topical Analgesic, (BIOFREEZE) 4 % GEL Apply 1 application topically 3 (three) times daily. Right hip, knees and or any area of pain   Yes [provider]  metoprolol succinate (TOPROL-XL) 50 MG 24 hr tablet Take 50 mg by mouth 2 (two) times daily. 03/12/21  Yes [provider]  Multiple Vitamins-Minerals (CERTAVITE SENIOR PO) Take 1 tablet by mouth daily.   Yes [provider]  nitrofurantoin, macrocrystal-monohydrate, (MACROBID) 100 MG capsule Take 100 mg by mouth See admin instructions. Bid x 7 days   Yes [provider]  olmesartan (BENICAR) 40 MG tablet Take 40 mg by mouth daily. 01/13/21  Yes [provider]  polyethylene glycol (MIRALAX / GLYCOLAX) 17  g packet Take 17 g by mouth every Monday, Wednesday, and Friday.   Yes [provider]  potassium chloride (MICRO-K) 10 MEQ CR capsule Take 10 mEq by mouth daily. 11/20/21  Yes [provider]  pravastatin (PRAVACHOL) 40 MG tablet Take 40 mg by mouth at bedtime.   Yes [provider]  clopidogrel (PLAVIX) 75 MG tablet Take 1 tablet (75 mg total) by mouth daily. Patient not taking: Reported on 12/08/2021 12/29/19   Swayze, Ava, DO  guaiFENesin-dextromethorphan (ROBITUSSIN DM) 100-10 MG/5ML  syrup Take 2.5-5 mLs by mouth every 6 (six) hours as needed for cough. Patient not taking: Reported on 12/10/2021 04/13/20   Davonna Belling, MD      Allergies    Penicillins, Adhesive [tape], Codeine, Neosporin [neomycin-bacitracin zn-polymyx], and Sulfa antibiotics    Review of Systems   Review of Systems  Unable to perform ROS: Mental status change   Physical Exam Updated Vital Signs BP (!) 142/72    Pulse 89    Temp (!) 103.5 F (39.7 C) (Rectal)    Resp (!) 24    Ht 5' 5.98" (1.676 m)    Wt 61.2 kg Comment: estimate based on previous weights   SpO2 94%    BMI 21.80 kg/m  Physical Exam Vitals and nursing note reviewed.  Constitutional:      General: She is not in acute distress.    Comments: Somnolent, arousable to painful stimuli and will briefly follow commands. GCS 10 (2-3-5).  HENT:     Head: Normocephalic and atraumatic.  Eyes:     Conjunctiva/sclera: Conjunctivae normal.     Pupils: Pupils are equal, round, and reactive to light.  Neck:     Comments: Some pain with passive ROM of the neck Cardiovascular:     Rate and Rhythm: Regular rhythm. Tachycardia present.  Pulmonary:     Effort: Pulmonary effort is normal. No respiratory distress.  Abdominal:     General: There is no distension.     Tenderness: There is no guarding.  Musculoskeletal:        General: No deformity or signs of injury.  Skin:    Findings: No lesion or rash.  Neurological:     Mental Status: Mental status is at baseline.     Comments: No cranial nerve deficit, Moving all four extremities but exam limitied by patient mental status. Will withdraw to pain all four extremities. LUE weakness noted appears to be patient baseline    ED Results / Procedures / Treatments   Labs (all labs ordered are listed, but only abnormal results are displayed) Labs Reviewed  COMPREHENSIVE METABOLIC PANEL - Abnormal; Notable for the following components:      Result Value   CO2 20 (*)    Glucose, Bld 120 (*)     AST 117 (*)    ALT 82 (*)    Total Bilirubin 2.3 (*)    All other components within normal limits  CBC WITH DIFFERENTIAL/PLATELET - Abnormal; Notable for the following components:   WBC 15.5 (*)    Hemoglobin 16.1 (*)    HCT 47.9 (*)    Neutro Abs 14.7 (*)    Lymphs Abs 0.1 (*)    Abs Immature Granulocytes 0.12 (*)    All other components within normal limits  URINALYSIS, ROUTINE W REFLEX MICROSCOPIC - Abnormal; Notable for the following components:   Color, Urine AMBER (*)    pH 9.0 (*)    Hgb urine dipstick MODERATE (*)  RBC / HPF >50 (*)    All other components within normal limits  ACETAMINOPHEN LEVEL - Abnormal; Notable for the following components:   Acetaminophen (Tylenol), Serum <10 (*)    All other components within normal limits  SALICYLATE LEVEL - Abnormal; Notable for the following components:   Salicylate Lvl <0.0 (*)    All other components within normal limits  LACTIC ACID, PLASMA - Abnormal; Notable for the following components:   Lactic Acid, Venous 4.1 (*)    All other components within normal limits  LACTIC ACID, PLASMA - Abnormal; Notable for the following components:   Lactic Acid, Venous 5.4 (*)    All other components within normal limits  BRAIN NATRIURETIC PEPTIDE - Abnormal; Notable for the following components:   B Natriuretic Peptide 506.7 (*)    All other components within normal limits  D-DIMER, QUANTITATIVE - Abnormal; Notable for the following components:   D-Dimer, Quant 7.31 (*)    All other components within normal limits  CSF CELL COUNT WITH DIFFERENTIAL - Abnormal; Notable for the following components:   RBC Count, CSF 1 (*)    All other components within normal limits  PROTEIN AND GLUCOSE, CSF - Abnormal; Notable for the following components:   Total  Protein, CSF 51 (*)    All other components within normal limits  I-STAT CHEM 8, ED - Abnormal; Notable for the following components:   Glucose, Bld 115 (*)    Calcium, Ion 1.13 (*)     Hemoglobin 16.0 (*)    HCT 47.0 (*)    All other components within normal limits  CBG MONITORING, ED - Abnormal; Notable for the following components:   Glucose-Capillary 108 (*)    All other components within normal limits  I-STAT VENOUS BLOOD GAS, ED - Abnormal; Notable for the following components:   pH, Ven 7.489 (*)    pCO2, Ven 29.8 (*)    pO2, Ven 54 (*)    Calcium, Ion 1.13 (*)    Hemoglobin 15.6 (*)    All other components within normal limits  RESP PANEL BY RT-PCR (FLU A&B, COVID) ARPGX2  CSF CULTURE W GRAM STAIN  URINE CULTURE  CULTURE, BLOOD (ROUTINE X 2)  CULTURE, BLOOD (ROUTINE X 2)  RAPID URINE DRUG SCREEN, HOSP PERFORMED  PROTIME-INR  APTT  HSV 1/2 PCR, CSF  LACTIC ACID, PLASMA  LACTIC ACID, PLASMA  CBC WITH DIFFERENTIAL/PLATELET  HEPATIC FUNCTION PANEL  BASIC METABOLIC PANEL  TROPONIN I (HIGH SENSITIVITY)    EKG EKG Interpretation  Date/Time:  Sunday December 10 2021 16:41:03 EST Ventricular Rate:  100 PR Interval:  149 QRS Duration: 98 QT Interval:  371 QTC Calculation: 479 R Axis:   -28 Text Interpretation: Sinus tachycardia Left ventricular hypertrophy Anterior Q waves, possibly due to LVH Confirmed by Regan Lemming ([id number]) on 12/10/2021 5:06:27 PM  Radiology CT HEAD WO CONTRAST  Result Date: 12/10/2021 CLINICAL DATA:  Patient more lethargic. Change in behavior/mental status. Recently treated for a UTI. EXAM: CT HEAD WITHOUT CONTRAST TECHNIQUE: Contiguous axial images were obtained from the base of the skull through the vertex without intravenous contrast. RADIATION DOSE REDUCTION: This exam was performed according to the departmental dose-optimization program which includes automated exposure control, adjustment of the mA and/or kV according to patient size and/or use of iterative reconstruction technique. COMPARISON:  03/15/2020 FINDINGS: Brain: No evidence of acute infarction, hemorrhage, hydrocephalus, extra-axial collection or mass lesion/mass effect.  Old right occipital/parietal infarct, unchanged. Bilateral periventricular white matter hypoattenuation consistent  with advanced chronic microvascular ischemic change. Vascular: No hyperdense vessel or unexpected calcification. Skull: Normal. Negative for fracture or focal lesion. Sinuses/Orbits: Globes and orbits are unremarkable. Visualized sinuses are clear. Other: None. IMPRESSION: 1. No acute intracranial abnormalities. 2. Old right occipital/parietal lobe infarct and advanced chronic microvascular ischemic change, stable compared to the previous head CT. Electronically Signed   By: Lajean Manes M.D.   On: 12/10/2021 16:07   CT Angio Chest PE W and/or Wo Contrast  Result Date: 12/10/2021 CLINICAL DATA:  Lethargy and recent UTI. EXAM: CT ANGIOGRAPHY CHEST WITH CONTRAST TECHNIQUE: Multidetector CT imaging of the chest was performed using the standard protocol during bolus administration of intravenous contrast. Multiplanar CT image reconstructions and MIPs were obtained to evaluate the vascular anatomy. RADIATION DOSE REDUCTION: This exam was performed according to the departmental dose-optimization program which includes automated exposure control, adjustment of the mA and/or kV according to patient size and/or use of iterative reconstruction technique. CONTRAST:  51m OMNIPAQUE IOHEXOL 350 MG/ML SOLN COMPARISON:  March 21, 2020 and July 01, 2019 FINDINGS: Cardiovascular: There is marked severity calcification of the aortic arch and descending thoracic aorta, without evidence of aortic aneurysm or dissection. Satisfactory opacification of the pulmonary arteries to the segmental level. No evidence of pulmonary embolism. There is mild cardiomegaly with moderate severity coronary artery calcification. A trace amount of pericardial fluid is seen. Mediastinum/Nodes: No enlarged mediastinal, hilar, or axillary lymph nodes. Thyroid gland, trachea, and esophagus demonstrate no significant findings. Lungs/Pleura:  Mild biapical scarring and/or atelectasis is seen. This extends along the posterior aspect of the right upper lobe and right lower lobe. Mild atelectasis is also seen within the posterior aspect of the left lung base. A trace amount of pleural fluid is noted, bilaterally. No pneumothorax is identified. Upper Abdomen: A stable 17 mm x 11 mm focus of parenchymal low attenuation (approximately 26.33 Hounsfield units) is seen within the posterior aspect of the left lobe of the liver. Musculoskeletal: Bilateral partially calcified breast implants are seen. Multilevel degenerative changes are noted throughout the thoracic spine. Review of the MIP images confirms the above findings. IMPRESSION: 1. No evidence of pulmonary embolism. 2. Mild biapical, posterior right upper lobe, posterior right lower lobe and left basilar scarring and/or atelectasis. 3. Trace bilateral pleural effusions. 4. Trace amount of pleural fluid, bilaterally. 5. Mild cardiomegaly with moderate severity coronary artery calcification. 6. Stable hepatic cyst. 7. Aortic atherosclerosis. Aortic Atherosclerosis (ICD10-I70.0). Electronically Signed   By: TVirgina NorfolkM.D.   On: 12/10/2021 23:05   MR ANGIO HEAD WO CONTRAST  Result Date: 12/10/2021 CLINICAL DATA:  Altered mental status EXAM: MRI HEAD WITHOUT CONTRAST MRA HEAD WITHOUT CONTRAST TECHNIQUE: Multiplanar, multi-echo pulse sequences of the brain and surrounding structures were acquired without intravenous contrast. Angiographic images of the Circle of Willis were acquired using MRA technique without intravenous contrast. COMPARISON:  MRI MRA 12/25/2019, correlation is also made with CT head 12/10/2021 and CTA head 12/25/2019 FINDINGS: MRI HEAD FINDINGS Brain: No restricted diffusion to suggest acute or subacute infarct. Encephalomalacia in the right medial occipital lobe, medial parietal lobe, inferomedial temporal lobe, and hippocampus, consistent with evolution of the PCA territory  infarcts noted on 12/25/2019. Some of these areas are associated with hemosiderin deposition, consistent with petechial hemorrhage. No acute hemorrhage, mass, mass effect, or midline shift. No hydrocephalus or extra-axial collection. Confluent T2 hyperintense signal in the periventricular white matter, likely the sequela of severe chronic small vessel ischemic disease. Lacunar infarcts in the left pons, right  thalamus, and bilateral basal ganglia. Vascular: Please see MRA below. Skull and upper cervical spine: Normal marrow signal. Degenerative changes at C3-C4, with mild anterolisthesis, and at C4-C5, likely causing moderate spinal canal stenosis. Sinuses/Orbits: No acute or significant finding. Status post bilateral lens replacements. Other: None. MRA HEAD FINDINGS Anterior circulation: Both internal carotid arteries are patent to the termini, without significant stenosis. Aplastic or stenosed right A1. Normal left A1. Normal anterior communicating artery. Anterior cerebral arteries are patent to their distal aspects. No M1 stenosis or occlusion. Normal MCA bifurcations. Distal MCA branches perfused and symmetric. Posterior circulation: Imaged portions of the vertebral arteries patent to the vertebrobasilar junction without stenosis. Basilar patent to its distal aspect. Superior cerebellar arteries patent bilaterally. Patent P1 segments. Severe focal stenosis in the proximal left P2 segment (series 5, image 55), which is likely similar to the prior exam, although that exam was limited by motion. Left PCA branches are otherwise perfused to their distal aspects without stenosis. The right P2 segments are patent, although there is poor signal in the proximal right P3 segments, with distal right P3s not visualized. The bilateral posterior communicating arteries are not visualized. Anatomic variants: None significant IMPRESSION: 1. No acute intracranial process. 2. Severe focal stenosis in the proximal left P2 segment,  which is likely similar to the prior exam, although that exam was limited by motion. 3. Poor signal in the proximal right P3 segments, without visualization of the distal right P3s, which are likely significantly stenosed or occluded. 4. Encephalomalacia in the right parietal, occipital, and inferior temporal lobes, consistent with expected evolution of the previously noted right PCA territory infarct. Electronically Signed   By: Merilyn Baba M.D.   On: 12/10/2021 21:50   MR BRAIN WO CONTRAST  Result Date: 12/10/2021 CLINICAL DATA:  Altered mental status EXAM: MRI HEAD WITHOUT CONTRAST MRA HEAD WITHOUT CONTRAST TECHNIQUE: Multiplanar, multi-echo pulse sequences of the brain and surrounding structures were acquired without intravenous contrast. Angiographic images of the Circle of Willis were acquired using MRA technique without intravenous contrast. COMPARISON:  MRI MRA 12/25/2019, correlation is also made with CT head 12/10/2021 and CTA head 12/25/2019 FINDINGS: MRI HEAD FINDINGS Brain: No restricted diffusion to suggest acute or subacute infarct. Encephalomalacia in the right medial occipital lobe, medial parietal lobe, inferomedial temporal lobe, and hippocampus, consistent with evolution of the PCA territory infarcts noted on 12/25/2019. Some of these areas are associated with hemosiderin deposition, consistent with petechial hemorrhage. No acute hemorrhage, mass, mass effect, or midline shift. No hydrocephalus or extra-axial collection. Confluent T2 hyperintense signal in the periventricular white matter, likely the sequela of severe chronic small vessel ischemic disease. Lacunar infarcts in the left pons, right thalamus, and bilateral basal ganglia. Vascular: Please see MRA below. Skull and upper cervical spine: Normal marrow signal. Degenerative changes at C3-C4, with mild anterolisthesis, and at C4-C5, likely causing moderate spinal canal stenosis. Sinuses/Orbits: No acute or significant finding. Status  post bilateral lens replacements. Other: None. MRA HEAD FINDINGS Anterior circulation: Both internal carotid arteries are patent to the termini, without significant stenosis. Aplastic or stenosed right A1. Normal left A1. Normal anterior communicating artery. Anterior cerebral arteries are patent to their distal aspects. No M1 stenosis or occlusion. Normal MCA bifurcations. Distal MCA branches perfused and symmetric. Posterior circulation: Imaged portions of the vertebral arteries patent to the vertebrobasilar junction without stenosis. Basilar patent to its distal aspect. Superior cerebellar arteries patent bilaterally. Patent P1 segments. Severe focal stenosis in the proximal left P2 segment (series  5, image 55), which is likely similar to the prior exam, although that exam was limited by motion. Left PCA branches are otherwise perfused to their distal aspects without stenosis. The right P2 segments are patent, although there is poor signal in the proximal right P3 segments, with distal right P3s not visualized. The bilateral posterior communicating arteries are not visualized. Anatomic variants: None significant IMPRESSION: 1. No acute intracranial process. 2. Severe focal stenosis in the proximal left P2 segment, which is likely similar to the prior exam, although that exam was limited by motion. 3. Poor signal in the proximal right P3 segments, without visualization of the distal right P3s, which are likely significantly stenosed or occluded. 4. Encephalomalacia in the right parietal, occipital, and inferior temporal lobes, consistent with expected evolution of the previously noted right PCA territory infarct. Electronically Signed   By: Merilyn Baba M.D.   On: 12/10/2021 21:50   CT ABDOMEN PELVIS W CONTRAST  Result Date: 12/10/2021 CLINICAL DATA:  Nausea and vomiting.  Complicated UTI.  Sepsis. EXAM: CT ABDOMEN AND PELVIS WITH CONTRAST TECHNIQUE: Multidetector CT imaging of the abdomen and pelvis was  performed using the standard protocol following bolus administration of intravenous contrast. RADIATION DOSE REDUCTION: This exam was performed according to the departmental dose-optimization program which includes automated exposure control, adjustment of the mA and/or kV according to patient size and/or use of iterative reconstruction technique. CONTRAST:  63m OMNIPAQUE IOHEXOL 300 MG/ML  SOLN COMPARISON:  Remote CT 07/09/2012 FINDINGS: Lower chest: Trace bilateral pleural thickening and adjacent basilar atelectasis. The heart is enlarged. There are bilateral breast implants. Subcutaneous stranding is noted medial to the left breast implant in the left anterior chest wall, series 3, image 11. No drainable collection or soft tissue gas. Hepatobiliary: 11 mm hypodense lesion in the left hepatic lobe, slightly smaller than on remote prior exam. Focal fatty infiltration adjacent to the falciform ligament. Motion artifact through the gallbladder area and mid lower liver limits assessment. No obvious calcified gallstone. There is no biliary dilatation. Pancreas: Parenchymal atrophy. No ductal dilatation or inflammation. Spleen: Normal in size without focal abnormality. Adrenals/Urinary Tract: Normal adrenal glands. 10 mm nonobstructing stone in the upper right kidney. Slight prominence of the right renal pelvis but no hydronephrosis. There may be slight right ureteral thickening and enhancement. There is faint right perinephric edema, partially motion obscured. No evidence of focal renal lesion. No renal fluid collection. 9 mm stone in the left renal pelvis but no hydronephrosis or caliceal dilatation. There is also a 6 mm intrarenal calculi in the left upper pole. There may be slight thickening and enhancement of the left renal pelvis and ureter. No renal fluid collection. The urinary bladder is nondistended, no bladder stone or wall thickening. Stomach/Bowel: Detailed bowel assessment is limited in the absence of  enteric contrast and motion artifact. The stomach is decompressed. There is no small bowel obstruction or evidence of inflammation. The appendix is not confidently visualized on the current exam. Decompressed ascending colon. Small volume of stool throughout the remainder of the colon. No colonic inflammation. There is stool distending the rectum. Vascular/Lymphatic: Moderate to advanced aortic atherosclerosis. No aortic aneurysm. Patent portal vein. No abdominopelvic adenopathy. Reproductive: Status post hysterectomy. No adnexal masses. Other: No free air or ascites. Small fat containing umbilical hernia. Musculoskeletal: Scoliosis and diffuse degenerative change in the lumbar spine. Slight superior endplate compression deformity of L2 and L3. The bones are under mineralized. Surgical hardware in the right proximal femur. No evidence of  focal bone lesion or acute osseous finding. IMPRESSION: 1. Bilateral nonobstructing renal calculi. There may be slight thickening and enhancement of the renal collecting systems and ureters, suggesting urinary tract infection. No renal fluid collection. 2. Subcutaneous stranding medial to the left breast implant in the left anterior chest wall, may be infectious or inflammatory. No drainable collection or soft tissue gas. Recommend correlation with physical exam. Aortic Atherosclerosis (ICD10-I70.0). Electronically Signed   By: Keith Rake M.D.   On: 12/10/2021 18:28   DG Chest Port 1 View  Result Date: 12/10/2021 CLINICAL DATA:  Questionable sepsis.  Altered mental status. EXAM: PORTABLE CHEST 1 VIEW COMPARISON:  04/13/2020 FINDINGS: Borderline cardiomegaly.The cardiomediastinal contours are unchanged with atherosclerosis. The lungs are clear. Pulmonary vasculature is normal. No consolidation, pleural effusion, or pneumothorax. No acute osseous abnormalities are seen. Calcified breast implants. IMPRESSION: Low lung volumes with borderline cardiomegaly. No acute chest  findings. Electronically Signed   By: Keith Rake M.D.   On: 12/10/2021 17:12    Procedures .Critical Care Performed by: Regan Lemming, MD Authorized by: Regan Lemming, MD   Critical care provider statement:    Critical care time (minutes):  80   Critical care was necessary to treat or prevent imminent or life-threatening deterioration of the following conditions:  Sepsis   Critical care was time spent personally by me on the following activities:  Blood draw for specimens, development of treatment plan with patient or surrogate, examination of patient, evaluation of patient's response to treatment, obtaining history from patient or surrogate, ordering and performing treatments and interventions, ordering and review of laboratory studies, ordering and review of radiographic studies, pulse oximetry, re-evaluation of patient's condition and review of old charts   Care discussed with: admitting provider   .Lumbar Puncture  Date/Time: 12/10/2021 10:21 PM Performed by: Regan Lemming, MD Authorized by: Regan Lemming, MD   Consent:    Consent obtained:  Verbal and written   Consent given by:  Healthcare agent   Risks discussed:  Bleeding, infection and repeat procedure   Alternatives discussed:  No treatment Universal protocol:    Patient identity confirmed:  Arm band Pre-procedure details:    Procedure purpose:  Diagnostic   Preparation: Patient was prepped and draped in usual sterile fashion   Anesthesia:    Anesthesia method:  Local infiltration   Local anesthetic:  Lidocaine 1% w/o epi Procedure details:    Lumbar space:  L4-L5 interspace   Patient position:  L lateral decubitus   Needle gauge:  22   Needle type:  Spinal needle - Quincke tip   Needle length (in):  3.5   Ultrasound guidance: no     Number of attempts:  1   Fluid appearance:  Clear   Tubes of fluid:  4   Total volume (ml):  10 Post-procedure details:    Puncture site:  Adhesive bandage applied    Procedure completion:  Tolerated    Medications Ordered in ED Medications  vancomycin (VANCOREADY) IVPB 500 mg/100 mL (has no administration in time range)  lactated ringers infusion (has no administration in time range)  acetaminophen (TYLENOL) tablet 650 mg (has no administration in time range)    Or  acetaminophen (TYLENOL) suppository 650 mg (has no administration in time range)  cefTRIAXone (ROCEPHIN) 2 g in sodium chloride 0.9 % 100 mL IVPB (has no administration in time range)  lactated ringers infusion (has no administration in time range)  lactated ringers bolus 1,000 mL (0 mLs Intravenous Stopped 12/10/21 1916)  And  lactated ringers bolus 1,000 mL (0 mLs Intravenous Stopped 12/10/21 1916)  metroNIDAZOLE (FLAGYL) IVPB 500 mg (0 mg Intravenous Stopped 12/10/21 1748)  vancomycin (VANCOCIN) IVPB 1000 mg/200 mL premix (0 mg Intravenous Stopped 12/10/21 1844)  cefTRIAXone (ROCEPHIN) 2 g in sodium chloride 0.9 % 100 mL IVPB (0 g Intravenous Stopped 12/10/21 1722)  acetaminophen (TYLENOL) suppository 650 mg (650 mg Rectal Given 12/10/21 1736)  iohexol (OMNIPAQUE) 300 MG/ML solution 75 mL (75 mLs Intravenous Contrast Given 12/10/21 1754)  iohexol (OMNIPAQUE) 350 MG/ML injection 50 mL (50 mLs Intravenous Contrast Given 12/10/21 2250)    ED Course/ Medical Decision Making/ A&P Clinical Course as of 12/10/21 2358  Sun Dec 10, 2021  1609 WBC(!): 15.5 [JL]  1609 Pulse Rate(!): 102 [JL]  1706 Lactic Acid, Venous(!!): 4.1 [JL]  2346 WBC, CSF: 2 [JL]  2346 RBC Count, CSF(!): 1 [JL]    Clinical Course User Index [JL] Regan Lemming, MD                           Medical Decision Making Amount and/or Complexity of Data Reviewed Labs: ordered. Decision-making details documented in ED Course. Radiology: ordered. ECG/medicine tests: ordered.  Risk OTC drugs. Prescription drug management. Decision regarding hospitalization.   81 year old female with a history of HTN, HLD, GERD, acute metabolic  encephalopathy, Lewy body dementia, prior CVA with residual left-sided deficits presenting to the emergency department with altered mental status.  The patient was reportedly diagnosed with a urinary tract infection 2 days ago and started on Macrobid.  She has received 2 doses of Macrobid.  In the last 24 hours, the patient was notably more lethargic and not acting like herself.  Per family, baseline mental status is alert and oriented to person and able to hold a conversation.  She has been less talkative.  She is normally able to follow commands and answer questions.  Family noted no new neurologic deficits over the last 24 hours.  She was febrile at her facility.  On arrival, the patient was febrile, tachycardic, tachypneic with a source of infection potentially being genitourinary with a prior diagnosis of urinary tract infection treated with Macrobid.  The patient was febrile to 103.5 via rectal temperature and tachycardic to 102, mildly tachypneic RR 26, hypertensive BP 165/85. GCS 10 (2-3-5).  Code sepsis initiated on patient arrival and the patient was administered 2 L IV fluids for volume resuscitation. Concern for urosepsis.  Differential is broad and includes sepsis from meningitis/encephalitis, pneumonia, intra-abdominal infection, genitourinary infection.  Additionally considered PE given tachycardia and tachypnea.  Considered stroke CVA although the patient appears to be at her baseline with residual left-sided deficits from an old CVA.  Considered toxic metabolic derangement resulting in encephalopathy.  The patient was covered with broad-spectrum antibiotics to include Vancomycin, Flagyl and 2 g of Rocephin IV.  Her VBG did not reveal an acidosis with a pH of 7.48, bicarbonate of 22.7.  CBG on arrival was 108, urinalysis eventually was collected and revealed no clear evidence of urinary tract infection with WBC 6-10, no bacteria, negative nitrites and leukocytes.  This is complicated by the  fact the patient is on Macrobid.  A BNP was mildly elevated to 506, a CBC was with a leukocytosis to 15.5 and evidence of hemoconcentration with a hemoglobin of 16.1 concerning for dehydration.  Lactic acidosis was present, initially elevated to 4.1, repeat climbing to 5.4.  A troponin was normal.  A D-dimer was  elevated to 7.31 and a CTA PE study was ordered.  Chest x-ray revealed low lung volumes with borderline cardiomegaly with no acute chest findings. A CT abdomen pelvis was performed which revealed bilateral nonobstructing renal calculi with slight thickening and enhancement of the renal collecting systems and ureters which could correlate and suggest urinary tract infection. CTA PE study revealed no acute PE, no pulmonary edema, mild cardiomegaly, trace bilateral pleural effusions, no PNA.   A CT head was performed which revealed no acute intracranial abnormalities, old right occipital/parietal lobe infarct and advanced chronic microvascular ischemic changes were noted, stable right compared to prior CT head.  MRI of the brain and angio of the head was performed and revealed no acute abnormalities:     IMPRESSION:  1. No acute intracranial process.  2. Severe focal stenosis in the proximal left P2 segment, which is  likely similar to the prior exam, although that exam was limited by  motion.  3. Poor signal in the proximal right P3 segments, without  visualization of the distal right P3s, which are likely  significantly stenosed or occluded.  4. Encephalomalacia in the right parietal, occipital, and inferior  temporal lobes, consistent with expected evolution of the previously  noted right PCA territory infarct.    A lumbar puncture was performed successfully bedside to evaluate for meningitis or encephalitis. LP results revealed no evidence of CNS infection. Acyclovir and Ampicillin held and the patient is status post broad spectrum ABX to include 2g IV Rocephin, Flagyl, and  Vancomycin.  Suspect etiology of the patient's presentation to be toxic metabolic encephalopathy in the setting of bacteremia, from a likely genitourinary source given the patient's CT findings of slight renal enhancement on CT Abdomen and previous diagnosis of UTI two days ago outpatient. On reassessment, the patient was vitally stable, appeared well hydrated, and was protecting her airway. She would around to verbal and tactile stimulation but remained encephalopathic. Hospitalist medicine consulted for admission for further management and admission to a step down unit. Dr. Hal Hope accepted the patient in admission.      Final Clinical Impression(s) / ED Diagnoses Final diagnoses:  Altered mental status, unspecified altered mental status type  Fever, unspecified fever cause  Leukocytosis, unspecified type  Sepsis, due to unspecified organism, unspecified whether acute organ dysfunction present ([place])  Lactic acidosis    Rx / DC Orders ED Discharge Orders     None         Regan Lemming, MD 12/10/21 2352    Regan Lemming, MD 12/11/21 0003

## 2021-12-10 NOTE — ED Notes (Signed)
RN and NT competed in/out cath. Pt did not have any urine. Notified EDP ?

## 2021-12-10 NOTE — H&P (Addendum)
History and Physical    Claire West IWP:[id number] DOB: May 22, 1941 DOA: 12/10/2021  PCP: Claire Sites, MD  Patient coming from: [facility].  History obtained from patient's son and patient's sister.  Patient is encephalopathic and has a history of dementia.  Chief Complaint: Altered mental status.  Less responsive.  HPI: Claire West is a 81 y.o. female with history of Lewy body dementia and vascular dementia, hypertension, hyperlipidemia was recently treated for UTI with patient complaining of dysuria for the last 2 days.  This afternoon patient became more confused lethargic less responsive and was brought to the ER.  ED Course: In the ER patient was febrile with temperature 103 F and tachycardic.  Chest x-ray was unremarkable COVID test was negative.  UA was not showing any definite signs of UTI.  MRI brain did not show anything acute.  CT abdomen pelvis done shows features concerning for possible urinary tract infection.  In addition also there was some concern for possible cellulitis around the left breast implant.  On clinical exam the skin around the left breast implant does not look erythematous or no tenderness.  Lumbar puncture was done by the ER physician given the altered mental status with fever labs of which are pending.  Patient was started on empiric antibiotics.  Review of Systems: As per HPI, rest all negative.   Past Medical History:  Diagnosis Date   Arthritis    Depression    Gait instability    walks with cane   GERD (gastroesophageal reflux disease)    History of blood transfusion    Hyperlipemia    Hypertension    Knee pain    Metabolic encephalopathy    Mild cognitive impairment with memory loss    Seasonal allergies    Skin cancer    Stroke (HCC)    TIA (transient ischemic attack)     Past Surgical History:  Procedure Laterality Date   ABDOMINAL HYSTERECTOMY     ARTHROSCOPIC REPAIR ACL Left    COLONOSCOPY     COLONOSCOPY N/A  11/23/2015   Procedure: COLONOSCOPY;  Surgeon: Claire Houston, MD;  Location: [facility];  Service: Endoscopy;  Laterality: N/A;  830   EYE SURGERY Bilateral    Cataracts   SKIN CANCER EXCISION     Right shoulder   TOTAL KNEE ARTHROPLASTY Left 05/05/2018   Procedure: LEFT TOTAL KNEE ARTHROPLASTY;  Surgeon: Claire Arabian, MD;  Location: [facility];  Service: Orthopedics;  Laterality: Left;  with block   TOTAL KNEE ARTHROPLASTY Right 06/22/2019   Procedure: TOTAL KNEE ARTHROPLASTY;  Surgeon: Claire Arabian, MD;  Location: [facility];  Service: Orthopedics;  Laterality: Right;  37mn     reports that she has never smoked. She has never used smokeless tobacco. She reports that she does not currently use alcohol. She reports that she does not use drugs.  Allergies  Allergen Reactions   Penicillins Anaphylaxis, Swelling and Rash    Oral rash/peeling  **Has tolerated Rocephin, Vantin since 2017**     Adhesive [Tape] Itching and Rash    Irritation at site   Codeine Hives and Rash   Neosporin [Neomycin-Bacitracin Zn-Polymyx] Rash   Sulfa Antibiotics Hives and Rash    Family History  Problem Relation Age of Onset   Lung cancer Mother    Lung cancer Sister     Prior to Admission medications   Medication Sig Start Date End Date Taking? Authorizing Provider  acetaminophen (TYLENOL) 325 MG tablet Take 650  mg by mouth 3 (three) times daily.   Yes [provider]  amLODipine (NORVASC) 10 MG tablet Take 10 mg by mouth daily. 12/04/21  Yes [provider]  ARTIFICIAL TEAR OP Place 2 drops into both eyes in the morning and at bedtime.   Yes [provider]  aspirin EC 81 MG tablet Take 81 mg by mouth every morning.    Yes [provider]  busPIRone (BUSPAR) 5 MG tablet Take 5 mg by mouth 2 (two) times daily. 03/12/21  Yes [provider]  calcium carbonate (TUMS - DOSED IN MG ELEMENTAL CALCIUM) 500 MG chewable tablet Chew 1 tablet by mouth daily.   Yes  [provider]  cholecalciferol (VITAMIN D) 25 MCG (1000 UNIT) tablet Take 1,000 Units by mouth daily at 12 noon.   Yes [provider]  escitalopram (LEXAPRO) 20 MG tablet Take 20 mg by mouth daily. 03/07/18  Yes [provider]  famotidine (PEPCID) 20 MG tablet Take 20 mg by mouth every evening. 11/29/21  Yes [provider]  fexofenadine (ALLEGRA) 180 MG tablet Take 180 mg by mouth daily as needed for allergies or rhinitis.   Yes [provider]  HYDROcodone-acetaminophen (NORCO/VICODIN) 5-325 MG tablet Take 1 tablet by mouth 2 (two) times daily as needed for moderate pain.   Yes [provider]  LACTOBACILLUS PO Take 10 Billion Cells by mouth daily.   Yes [provider]  magnesium oxide (MAG-OX) 400 MG tablet Take 400 mg by mouth at bedtime.   Yes [provider]  Menthol, Topical Analgesic, (BIOFREEZE) 4 % GEL Apply 1 application topically 3 (three) times daily. Right hip, knees and or any area of pain   Yes [provider]  metoprolol succinate (TOPROL-XL) 50 MG 24 hr tablet Take 50 mg by mouth 2 (two) times daily. 03/12/21  Yes [provider]  Multiple Vitamins-Minerals (CERTAVITE SENIOR PO) Take 1 tablet by mouth daily.   Yes [provider]  nitrofurantoin, macrocrystal-monohydrate, (MACROBID) 100 MG capsule Take 100 mg by mouth See admin instructions. Bid x 7 days   Yes [provider]  olmesartan (BENICAR) 40 MG tablet Take 40 mg by mouth daily. 01/13/21  Yes [provider]  polyethylene glycol (MIRALAX / GLYCOLAX) 17 g packet Take 17 g by mouth every Monday, Wednesday, and Friday.   Yes [provider]  potassium chloride (MICRO-K) 10 MEQ CR capsule Take 10 mEq by mouth daily. 11/20/21  Yes [provider]  pravastatin (PRAVACHOL) 40 MG tablet Take 40 mg by mouth at bedtime.   Yes [provider]  clopidogrel (PLAVIX) 75 MG tablet Take 1 tablet (75  mg total) by mouth daily. Patient not taking: Reported on 12/08/2021 12/29/19   West, Ava, DO  guaiFENesin-dextromethorphan (ROBITUSSIN DM) 100-10 MG/5ML syrup Take 2.5-5 mLs by mouth every 6 (six) hours as needed for cough. Patient not taking: Reported on 12/10/2021 04/13/20   Davonna Belling, MD    Physical Exam: Constitutional: Moderately built and nourished. Vitals:   12/10/21 1730 12/10/21 1815 12/10/21 1845 12/10/21 2015  BP: (!) 167/88 140/68 138/65 (!) 142/72  Pulse: 94 95 89 89  Resp: 18 (!) 35 (!) 27 (!) 24  Temp:      TempSrc:      SpO2: 96% 99% 97% 94%   Eyes: Anicteric no pallor. ENMT: No discharge from the ears eyes nose and mouth. Neck: No mass felt.  No neck rigidity. Respiratory: No rhonchi or crepitations. Cardiovascular:  S1-S2 heard. Abdomen: Soft nontender bowel sound present. Musculoskeletal: No edema.  Does not move her left knee and Per family it has been chronic. Skin: No rash. Neurologic: Patient is alert awake but not oriented.  Moving all extremities. Psychiatric: Confused.   Labs on Admission: I have personally reviewed following labs and imaging studies  CBC: Recent Labs  Lab 12/10/21 1500 12/10/21 1557  WBC 15.5*  --   NEUTROABS 14.7*  --   HGB 16.1* 16.0*   15.6*  HCT 47.9* 47.0*   46.0  MCV 95.2  --   PLT 221  --    Basic Metabolic Panel: Recent Labs  Lab 12/10/21 1500 12/10/21 1557  NA 138 139   139  K 3.8 3.8   3.8  CL 103 105  CO2 20*  --   GLUCOSE 120* 115*  BUN 14 16  CREATININE 0.84 0.50  CALCIUM 9.3  --    GFR: CrCl cannot be calculated (Unknown ideal weight.). Liver Function Tests: Recent Labs  Lab 12/10/21 1500  AST 117*  ALT 82*  ALKPHOS 94  BILITOT 2.3*  PROT 7.2  ALBUMIN 3.5   No results for input(s): LIPASE, AMYLASE in the last 168 hours. No results for input(s): AMMONIA in the last 168 hours. Coagulation Profile: Recent Labs  Lab 12/10/21 1748  INR 1.2   Cardiac Enzymes: No results for input(s):  CKTOTAL, CKMB, CKMBINDEX, TROPONINI in the last 168 hours. BNP (last 3 results) No results for input(s): PROBNP in the last 8760 hours. HbA1C: No results for input(s): HGBA1C in the last 72 hours. CBG: Recent Labs  Lab 12/10/21 1547  GLUCAP 108*   Lipid Profile: No results for input(s): CHOL, HDL, LDLCALC, TRIG, CHOLHDL, LDLDIRECT in the last 72 hours. Thyroid Function Tests: No results for input(s): TSH, T4TOTAL, FREET4, T3FREE, THYROIDAB in the last 72 hours. Anemia Panel: No results for input(s): VITAMINB12, FOLATE, FERRITIN, TIBC, IRON, RETICCTPCT in the last 72 hours. Urine analysis:    Component Value Date/Time   COLORURINE AMBER (A) 12/10/2021 1521   APPEARANCEUR CLEAR 12/10/2021 1521   LABSPEC 1.011 12/10/2021 1521   PHURINE 9.0 (H) 12/10/2021 1521   GLUCOSEU NEGATIVE 12/10/2021 1521   HGBUR MODERATE (A) 12/10/2021 1521   BILIRUBINUR NEGATIVE 12/10/2021 1521   KETONESUR NEGATIVE 12/10/2021 1521   PROTEINUR NEGATIVE 12/10/2021 1521   NITRITE NEGATIVE 12/10/2021 1521   LEUKOCYTESUR NEGATIVE 12/10/2021 1521   Sepsis Labs: '@LABRCNTIP'$ (procalcitonin:4,lacticidven:4) ) Recent Results (from the past 240 hour(s))  Resp Panel by RT-PCR (Flu A&B, Covid) Nasopharyngeal Swab     Status: None   Collection Time: 12/10/21  4:20 PM   Specimen: Nasopharyngeal Swab; Nasopharyngeal(NP) swabs in vial transport medium  Result Value Ref Range Status   SARS Coronavirus 2 by RT PCR NEGATIVE NEGATIVE Final    Comment: (NOTE) SARS-CoV-2 target nucleic acids are NOT DETECTED.  The SARS-CoV-2 RNA is generally detectable in upper respiratory specimens during the acute phase of infection. The lowest concentration of SARS-CoV-2 viral copies this assay can detect is 138 copies/mL. A negative result does not preclude SARS-Cov-2 infection and should not be used as the sole basis for treatment or other patient management decisions. A negative result may occur with  improper specimen  collection/handling, submission of specimen other than nasopharyngeal swab, presence of viral mutation(s) within the areas targeted by this assay, and inadequate number of viral copies(<138 copies/mL). A negative result must be combined with clinical observations, patient history, and epidemiological information. The expected result is Negative.  Fact Sheet for Patients:  EntrepreneurPulse.com.au  Fact Sheet for Healthcare Providers:  IncredibleEmployment.be  This test is no t yet approved or cleared by the [place] FDA and  has been authorized for detection and/or diagnosis of SARS-CoV-2 by FDA under an Emergency Use Authorization (EUA). This EUA will remain  in effect (meaning this test can be used) for the duration of the COVID-19 declaration under Section 564(b)(1) of the Act, 21 U.S.C.section 360bbb-3(b)(1), unless the authorization is terminated  or revoked sooner.       Influenza A by PCR NEGATIVE NEGATIVE Final   Influenza B by PCR NEGATIVE NEGATIVE Final    Comment: (NOTE) The Xpert Xpress SARS-CoV-2/FLU/RSV plus assay is intended as an aid in the diagnosis of influenza from Nasopharyngeal swab specimens and should not be used as a sole basis for treatment. Nasal washings and aspirates are unacceptable for Xpert Xpress SARS-CoV-2/FLU/RSV testing.  Fact Sheet for Patients: EntrepreneurPulse.com.au  Fact Sheet for Healthcare Providers: IncredibleEmployment.be  This test is not yet approved or cleared by the [place] FDA and has been authorized for detection and/or diagnosis of SARS-CoV-2 by FDA under an Emergency Use Authorization (EUA). This EUA will remain in effect (meaning this test can be used) for the duration of the COVID-19 declaration under Section 564(b)(1) of the Act, 21 U.S.C. section 360bbb-3(b)(1), unless the authorization is terminated or revoked.  Performed at [facility], [place] [address], [place], [place] [address]   CSF culture w Gram Stain     Status: None (Preliminary result)   Collection Time: 12/10/21 10:15 PM   Specimen: CSF; Cerebrospinal Fluid  Result Value Ref Range Status   Specimen Description CSF  Final   Special Requests TUBE 2  Final   Gram Stain   Final    CYTOSPIN SMEAR WBC PRESENT, PREDOMINANTLY MONONUCLEAR NO ORGANISMS SEEN Performed at [facility], [place] [address], [place], [place] [address]    Culture PENDING  Incomplete   Report Status PENDING  Incomplete     Radiological Exams on Admission: CT HEAD WO CONTRAST  Result Date: 12/10/2021 CLINICAL DATA:  Patient more lethargic. Change in behavior/mental status. Recently treated for a UTI. EXAM: CT HEAD WITHOUT CONTRAST TECHNIQUE: Contiguous axial images were obtained from the base of the skull through the vertex without intravenous contrast. RADIATION DOSE REDUCTION: This exam was performed according to the departmental dose-optimization program which includes automated exposure control, adjustment of the mA and/or kV according to patient size and/or use of iterative reconstruction technique. COMPARISON:  03/15/2020 FINDINGS: Brain: No evidence of acute infarction, hemorrhage, hydrocephalus, extra-axial collection or mass lesion/mass effect. Old right occipital/parietal infarct, unchanged. Bilateral periventricular white matter hypoattenuation consistent with advanced chronic microvascular ischemic change. Vascular: No hyperdense vessel or unexpected calcification. Skull: Normal. Negative for fracture or focal lesion. Sinuses/Orbits: Globes and orbits are unremarkable. Visualized sinuses are clear. Other: None. IMPRESSION: 1. No acute intracranial abnormalities. 2. Old right occipital/parietal lobe infarct and advanced chronic microvascular ischemic change, stable compared to the previous head CT. Electronically Signed   By: Lajean Manes M.D.   On: 12/10/2021 16:07   CT  Angio Chest PE W and/or Wo Contrast  Result Date: 12/10/2021 CLINICAL DATA:  Lethargy and recent UTI. EXAM: CT ANGIOGRAPHY CHEST WITH CONTRAST TECHNIQUE: Multidetector CT imaging of the chest was performed using the standard protocol during bolus administration of intravenous contrast. Multiplanar CT image reconstructions and MIPs were obtained to evaluate the vascular anatomy. RADIATION DOSE REDUCTION: This exam  was performed according to the departmental dose-optimization program which includes automated exposure control, adjustment of the mA and/or kV according to patient size and/or use of iterative reconstruction technique. CONTRAST:  66m OMNIPAQUE IOHEXOL 350 MG/ML SOLN COMPARISON:  March 21, 2020 and July 01, 2019 FINDINGS: Cardiovascular: There is marked severity calcification of the aortic arch and descending thoracic aorta, without evidence of aortic aneurysm or dissection. Satisfactory opacification of the pulmonary arteries to the segmental level. No evidence of pulmonary embolism. There is mild cardiomegaly with moderate severity coronary artery calcification. A trace amount of pericardial fluid is seen. Mediastinum/Nodes: No enlarged mediastinal, hilar, or axillary lymph nodes. Thyroid gland, trachea, and esophagus demonstrate no significant findings. Lungs/Pleura: Mild biapical scarring and/or atelectasis is seen. This extends along the posterior aspect of the right upper lobe and right lower lobe. Mild atelectasis is also seen within the posterior aspect of the left lung base. A trace amount of pleural fluid is noted, bilaterally. No pneumothorax is identified. Upper Abdomen: A stable 17 mm x 11 mm focus of parenchymal low attenuation (approximately 26.33 Hounsfield units) is seen within the posterior aspect of the left lobe of the liver. Musculoskeletal: Bilateral partially calcified breast implants are seen. Multilevel degenerative changes are noted throughout the thoracic spine. Review of  the MIP images confirms the above findings. IMPRESSION: 1. No evidence of pulmonary embolism. 2. Mild biapical, posterior right upper lobe, posterior right lower lobe and left basilar scarring and/or atelectasis. 3. Trace bilateral pleural effusions. 4. Trace amount of pleural fluid, bilaterally. 5. Mild cardiomegaly with moderate severity coronary artery calcification. 6. Stable hepatic cyst. 7. Aortic atherosclerosis. Aortic Atherosclerosis (ICD10-I70.0). Electronically Signed   By: TVirgina NorfolkM.D.   On: 12/10/2021 23:05   MR ANGIO HEAD WO CONTRAST  Result Date: 12/10/2021 CLINICAL DATA:  Altered mental status EXAM: MRI HEAD WITHOUT CONTRAST MRA HEAD WITHOUT CONTRAST TECHNIQUE: Multiplanar, multi-echo pulse sequences of the brain and surrounding structures were acquired without intravenous contrast. Angiographic images of the Circle of Willis were acquired using MRA technique without intravenous contrast. COMPARISON:  MRI MRA 12/25/2019, correlation is also made with CT head 12/10/2021 and CTA head 12/25/2019 FINDINGS: MRI HEAD FINDINGS Brain: No restricted diffusion to suggest acute or subacute infarct. Encephalomalacia in the right medial occipital lobe, medial parietal lobe, inferomedial temporal lobe, and hippocampus, consistent with evolution of the PCA territory infarcts noted on 12/25/2019. Some of these areas are associated with hemosiderin deposition, consistent with petechial hemorrhage. No acute hemorrhage, mass, mass effect, or midline shift. No hydrocephalus or extra-axial collection. Confluent T2 hyperintense signal in the periventricular white matter, likely the sequela of severe chronic small vessel ischemic disease. Lacunar infarcts in the left pons, right thalamus, and bilateral basal ganglia. Vascular: Please see MRA below. Skull and upper cervical spine: Normal marrow signal. Degenerative changes at C3-C4, with mild anterolisthesis, and at C4-C5, likely causing moderate spinal canal  stenosis. Sinuses/Orbits: No acute or significant finding. Status post bilateral lens replacements. Other: None. MRA HEAD FINDINGS Anterior circulation: Both internal carotid arteries are patent to the termini, without significant stenosis. Aplastic or stenosed right A1. Normal left A1. Normal anterior communicating artery. Anterior cerebral arteries are patent to their distal aspects. No M1 stenosis or occlusion. Normal MCA bifurcations. Distal MCA branches perfused and symmetric. Posterior circulation: Imaged portions of the vertebral arteries patent to the vertebrobasilar junction without stenosis. Basilar patent to its distal aspect. Superior cerebellar arteries patent bilaterally. Patent P1 segments. Severe focal stenosis in the proximal left P2  segment (series 5, image 55), which is likely similar to the prior exam, although that exam was limited by motion. Left PCA branches are otherwise perfused to their distal aspects without stenosis. The right P2 segments are patent, although there is poor signal in the proximal right P3 segments, with distal right P3s not visualized. The bilateral posterior communicating arteries are not visualized. Anatomic variants: None significant IMPRESSION: 1. No acute intracranial process. 2. Severe focal stenosis in the proximal left P2 segment, which is likely similar to the prior exam, although that exam was limited by motion. 3. Poor signal in the proximal right P3 segments, without visualization of the distal right P3s, which are likely significantly stenosed or occluded. 4. Encephalomalacia in the right parietal, occipital, and inferior temporal lobes, consistent with expected evolution of the previously noted right PCA territory infarct. Electronically Signed   By: Merilyn Baba M.D.   On: 12/10/2021 21:50   MR BRAIN WO CONTRAST  Result Date: 12/10/2021 CLINICAL DATA:  Altered mental status EXAM: MRI HEAD WITHOUT CONTRAST MRA HEAD WITHOUT CONTRAST TECHNIQUE: Multiplanar,  multi-echo pulse sequences of the brain and surrounding structures were acquired without intravenous contrast. Angiographic images of the Circle of Willis were acquired using MRA technique without intravenous contrast. COMPARISON:  MRI MRA 12/25/2019, correlation is also made with CT head 12/10/2021 and CTA head 12/25/2019 FINDINGS: MRI HEAD FINDINGS Brain: No restricted diffusion to suggest acute or subacute infarct. Encephalomalacia in the right medial occipital lobe, medial parietal lobe, inferomedial temporal lobe, and hippocampus, consistent with evolution of the PCA territory infarcts noted on 12/25/2019. Some of these areas are associated with hemosiderin deposition, consistent with petechial hemorrhage. No acute hemorrhage, mass, mass effect, or midline shift. No hydrocephalus or extra-axial collection. Confluent T2 hyperintense signal in the periventricular white matter, likely the sequela of severe chronic small vessel ischemic disease. Lacunar infarcts in the left pons, right thalamus, and bilateral basal ganglia. Vascular: Please see MRA below. Skull and upper cervical spine: Normal marrow signal. Degenerative changes at C3-C4, with mild anterolisthesis, and at C4-C5, likely causing moderate spinal canal stenosis. Sinuses/Orbits: No acute or significant finding. Status post bilateral lens replacements. Other: None. MRA HEAD FINDINGS Anterior circulation: Both internal carotid arteries are patent to the termini, without significant stenosis. Aplastic or stenosed right A1. Normal left A1. Normal anterior communicating artery. Anterior cerebral arteries are patent to their distal aspects. No M1 stenosis or occlusion. Normal MCA bifurcations. Distal MCA branches perfused and symmetric. Posterior circulation: Imaged portions of the vertebral arteries patent to the vertebrobasilar junction without stenosis. Basilar patent to its distal aspect. Superior cerebellar arteries patent bilaterally. Patent P1  segments. Severe focal stenosis in the proximal left P2 segment (series 5, image 55), which is likely similar to the prior exam, although that exam was limited by motion. Left PCA branches are otherwise perfused to their distal aspects without stenosis. The right P2 segments are patent, although there is poor signal in the proximal right P3 segments, with distal right P3s not visualized. The bilateral posterior communicating arteries are not visualized. Anatomic variants: None significant IMPRESSION: 1. No acute intracranial process. 2. Severe focal stenosis in the proximal left P2 segment, which is likely similar to the prior exam, although that exam was limited by motion. 3. Poor signal in the proximal right P3 segments, without visualization of the distal right P3s, which are likely significantly stenosed or occluded. 4. Encephalomalacia in the right parietal, occipital, and inferior temporal lobes, consistent with expected evolution of the  previously noted right PCA territory infarct. Electronically Signed   By: Merilyn Baba M.D.   On: 12/10/2021 21:50   CT ABDOMEN PELVIS W CONTRAST  Result Date: 12/10/2021 CLINICAL DATA:  Nausea and vomiting.  Complicated UTI.  Sepsis. EXAM: CT ABDOMEN AND PELVIS WITH CONTRAST TECHNIQUE: Multidetector CT imaging of the abdomen and pelvis was performed using the standard protocol following bolus administration of intravenous contrast. RADIATION DOSE REDUCTION: This exam was performed according to the departmental dose-optimization program which includes automated exposure control, adjustment of the mA and/or kV according to patient size and/or use of iterative reconstruction technique. CONTRAST:  63m OMNIPAQUE IOHEXOL 300 MG/ML  SOLN COMPARISON:  Remote CT 07/09/2012 FINDINGS: Lower chest: Trace bilateral pleural thickening and adjacent basilar atelectasis. The heart is enlarged. There are bilateral breast implants. Subcutaneous stranding is noted medial to the left breast  implant in the left anterior chest wall, series 3, image 11. No drainable collection or soft tissue gas. Hepatobiliary: 11 mm hypodense lesion in the left hepatic lobe, slightly smaller than on remote prior exam. Focal fatty infiltration adjacent to the falciform ligament. Motion artifact through the gallbladder area and mid lower liver limits assessment. No obvious calcified gallstone. There is no biliary dilatation. Pancreas: Parenchymal atrophy. No ductal dilatation or inflammation. Spleen: Normal in size without focal abnormality. Adrenals/Urinary Tract: Normal adrenal glands. 10 mm nonobstructing stone in the upper right kidney. Slight prominence of the right renal pelvis but no hydronephrosis. There may be slight right ureteral thickening and enhancement. There is faint right perinephric edema, partially motion obscured. No evidence of focal renal lesion. No renal fluid collection. 9 mm stone in the left renal pelvis but no hydronephrosis or caliceal dilatation. There is also a 6 mm intrarenal calculi in the left upper pole. There may be slight thickening and enhancement of the left renal pelvis and ureter. No renal fluid collection. The urinary bladder is nondistended, no bladder stone or wall thickening. Stomach/Bowel: Detailed bowel assessment is limited in the absence of enteric contrast and motion artifact. The stomach is decompressed. There is no small bowel obstruction or evidence of inflammation. The appendix is not confidently visualized on the current exam. Decompressed ascending colon. Small volume of stool throughout the remainder of the colon. No colonic inflammation. There is stool distending the rectum. Vascular/Lymphatic: Moderate to advanced aortic atherosclerosis. No aortic aneurysm. Patent portal vein. No abdominopelvic adenopathy. Reproductive: Status post hysterectomy. No adnexal masses. Other: No free air or ascites. Small fat containing umbilical hernia. Musculoskeletal: Scoliosis and  diffuse degenerative change in the lumbar spine. Slight superior endplate compression deformity of L2 and L3. The bones are under mineralized. Surgical hardware in the right proximal femur. No evidence of focal bone lesion or acute osseous finding. IMPRESSION: 1. Bilateral nonobstructing renal calculi. There may be slight thickening and enhancement of the renal collecting systems and ureters, suggesting urinary tract infection. No renal fluid collection. 2. Subcutaneous stranding medial to the left breast implant in the left anterior chest wall, may be infectious or inflammatory. No drainable collection or soft tissue gas. Recommend correlation with physical exam. Aortic Atherosclerosis (ICD10-I70.0). Electronically Signed   By: MKeith RakeM.D.   On: 12/10/2021 18:28   DG Chest Port 1 View  Result Date: 12/10/2021 CLINICAL DATA:  Questionable sepsis.  Altered mental status. EXAM: PORTABLE CHEST 1 VIEW COMPARISON:  04/13/2020 FINDINGS: Borderline cardiomegaly.The cardiomediastinal contours are unchanged with atherosclerosis. The lungs are clear. Pulmonary vasculature is normal. No consolidation, pleural effusion, or pneumothorax. No  acute osseous abnormalities are seen. Calcified breast implants. IMPRESSION: Low lung volumes with borderline cardiomegaly. No acute chest findings. Electronically Signed   By: Keith Rake M.D.   On: 12/10/2021 17:12    EKG: Independently reviewed.  Sinus tachycardia.  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  On clinical exam do not see any definite erythema of the left breast.  Since patient was encephalopathic with fever lumbar  puncture has been done results of which are pending.  We will keep patient on empiric antibiotics for now.  Follow cultures continue hydration.  At arrival patient was febrile tachycardic elevated lactic acid level encephalopathic consistent with sepsis physiology. Hypertension uncontrolled -until patient is more alert awake we will keep patient n.p.o. and keep patient n.p.o. and IV labetalol follow blood pressure trends. History of hyperlipidemia presently NPO. History of dementia. D-dimer was elevated and CT angiogram of the chest was ordered with the ER patient which is pending.  Since patient has symptoms concerning for severe sepsis will need close monitoring and inpatient status.   Repeat labs including lactic acid, LFTs metabolic panel and CBC with differentials are pending.  Please follow lumbar puncture labs.   DVT prophylaxis: SCDs.  Avoiding anticoagulation since patient just had lumbar puncture.  Probably in the morning patient likely can be started on Lovenox. Code Status: Full code. Family Communication: Patient's sister and son at the bedside. Disposition Plan: Back to facility when stable. Consults called: None. Admission status: Inpatient.   Rise Patience MD [facility] Pager [phone].  If 7PM-7AM, please contact night-coverage [email or web address] Password [facility]  12/10/2021, 11:17 PM

## 2021-12-10 NOTE — Progress Notes (Signed)
Pharmacy Antibiotic Note ? ?Claire West is a 81 y.o. female admitted on 12/10/2021 presenting from SNF with AMS, hx recent UTI, concern for sepsis.  Pharmacy has been consulted for vancomycin dosing.  Ceftriaxone per MD ? ?Plan: ?Vancomycin 1000 mg IV x 1, then 500 mg IV q 12h (eAUC 514, Goal AUC 400-550, SCr 0.84) ?Monitor renal function, Cx and clinical progression to narrow ?Vancomycin levels as needed ? ?  ? ?Temp (24hrs), Avg:103.5 ?F (39.7 ?C), Min:103.5 ?F (39.7 ?C), Max:103.5 ?F (39.7 ?C) ? ?Recent Labs  ?Lab 12/10/21 ?1500 12/10/21 ?1549 12/10/21 ?1557  ?WBC 15.5*  --   --   ?CREATININE 0.84  --  0.50  ?LATICACIDVEN  --  4.1*  --   ?  ?CrCl cannot be calculated (Unknown ideal weight.).   ? ?Allergies  ?Allergen Reactions  ? Penicillins Anaphylaxis, Swelling and Rash  ?  Oral rash/peeling ? ?**Has tolerated Rocephin, Vantin since 2017** ? ?  ? Adhesive [Tape] Itching and Rash  ?  Irritation at site  ? Codeine Hives and Rash  ? Neosporin [Neomycin-Bacitracin Zn-Polymyx] Rash  ? Sulfa Antibiotics Hives and Rash  ? ? ?Bertis Ruddy, PharmD ?Clinical Pharmacist ?ED Pharmacist Phone # [phone] ?12/10/2021 5:14 PM ? ? ?

## 2021-12-10 NOTE — ED Provider Triage Note (Incomplete)
Emergency Medicine Provider Triage Evaluation Note  Claire West , a 81 y.o. female  was evaluated in triage.  Pt complains of ***.  Mild cogn-2-3 strokes This AM   Review of Systems  Positive: *** Negative: ***  Physical Exam  BP (!) 165/85 (BP Location: Right Arm)    Pulse (!) 102    Resp (!) 26    SpO2 92%  Gen:   Awake, no distress  *** Resp:  Normal effort *** MSK:   Moves extremities without difficulty *** Other:  ***  Medical Decision Making  Medically screening exam initiated at 3:16 PM.  Appropriate orders placed.  Claire West was informed that the remainder of the evaluation will be completed by another provider, this initial triage assessment does not replace that evaluation, and the importance of remaining in the ED until their evaluation is complete.  ***

## 2021-12-11 ENCOUNTER — Inpatient Hospital Stay (HOSPITAL_COMMUNITY): Payer: Medicare Other

## 2021-12-11 ENCOUNTER — Other Ambulatory Visit (HOSPITAL_COMMUNITY): Payer: Medicare Other

## 2021-12-11 DIAGNOSIS — D72829 Elevated white blood cell count, unspecified: Secondary | ICD-10-CM

## 2021-12-11 DIAGNOSIS — I10 Essential (primary) hypertension: Secondary | ICD-10-CM

## 2021-12-11 DIAGNOSIS — Z8673 Personal history of transient ischemic attack (TIA), and cerebral infarction without residual deficits: Secondary | ICD-10-CM

## 2021-12-11 DIAGNOSIS — R7989 Other specified abnormal findings of blood chemistry: Secondary | ICD-10-CM

## 2021-12-11 DIAGNOSIS — R4182 Altered mental status, unspecified: Secondary | ICD-10-CM

## 2021-12-11 DIAGNOSIS — R509 Fever, unspecified: Secondary | ICD-10-CM

## 2021-12-11 DIAGNOSIS — E872 Acidosis, unspecified: Secondary | ICD-10-CM

## 2021-12-11 LAB — CBC WITH DIFFERENTIAL/PLATELET
Abs Immature Granulocytes: 0.12 10*3/uL — ABNORMAL HIGH (ref 0.00–0.07)
Basophils Absolute: 0 10*3/uL (ref 0.0–0.1)
Basophils Relative: 0 %
Eosinophils Absolute: 0 10*3/uL (ref 0.0–0.5)
Eosinophils Relative: 0 %
HCT: 39.7 % (ref 36.0–46.0)
Hemoglobin: 13.6 g/dL (ref 12.0–15.0)
Immature Granulocytes: 1 %
Lymphocytes Relative: 1 %
Lymphs Abs: 0.2 10*3/uL — ABNORMAL LOW (ref 0.7–4.0)
MCH: 32.3 pg (ref 26.0–34.0)
MCHC: 34.3 g/dL (ref 30.0–36.0)
MCV: 94.3 fL (ref 80.0–100.0)
Monocytes Absolute: 0.6 10*3/uL (ref 0.1–1.0)
Monocytes Relative: 4 %
Neutro Abs: 13.4 10*3/uL — ABNORMAL HIGH (ref 1.7–7.7)
Neutrophils Relative %: 94 %
Platelets: 164 10*3/uL (ref 150–400)
RBC: 4.21 MIL/uL (ref 3.87–5.11)
RDW: 12.9 % (ref 11.5–15.5)
WBC: 14.4 10*3/uL — ABNORMAL HIGH (ref 4.0–10.5)
nRBC: 0 % (ref 0.0–0.2)

## 2021-12-11 LAB — HEPATIC FUNCTION PANEL
ALT: 63 U/L — ABNORMAL HIGH (ref 0–44)
AST: 79 U/L — ABNORMAL HIGH (ref 15–41)
Albumin: 2.7 g/dL — ABNORMAL LOW (ref 3.5–5.0)
Alkaline Phosphatase: 64 U/L (ref 38–126)
Bilirubin, Direct: 1.7 mg/dL — ABNORMAL HIGH (ref 0.0–0.2)
Indirect Bilirubin: 1 mg/dL — ABNORMAL HIGH (ref 0.3–0.9)
Total Bilirubin: 2.7 mg/dL — ABNORMAL HIGH (ref 0.3–1.2)
Total Protein: 5.9 g/dL — ABNORMAL LOW (ref 6.5–8.1)

## 2021-12-11 LAB — LACTIC ACID, PLASMA
Lactic Acid, Venous: 2.1 mmol/L (ref 0.5–1.9)
Lactic Acid, Venous: 3.3 mmol/L (ref 0.5–1.9)
Lactic Acid, Venous: 3.6 mmol/L (ref 0.5–1.9)

## 2021-12-11 LAB — BASIC METABOLIC PANEL
Anion gap: 12 (ref 5–15)
BUN: 14 mg/dL (ref 8–23)
CO2: 20 mmol/L — ABNORMAL LOW (ref 22–32)
Calcium: 8.3 mg/dL — ABNORMAL LOW (ref 8.9–10.3)
Chloride: 103 mmol/L (ref 98–111)
Creatinine, Ser: 0.61 mg/dL (ref 0.44–1.00)
GFR, Estimated: >60 mL/min (ref >60)
Glucose, Bld: 120 mg/dL — ABNORMAL HIGH (ref 70–99)
Potassium: 3.5 mmol/L (ref 3.5–5.1)
Sodium: 135 mmol/L (ref 135–145)

## 2021-12-11 LAB — GLUCOSE, CAPILLARY
Glucose-Capillary: 118 mg/dL — ABNORMAL HIGH (ref 70–99)
Glucose-Capillary: 125 mg/dL — ABNORMAL HIGH (ref 70–99)

## 2021-12-11 LAB — PROCALCITONIN: Procalcitonin: 21.57 ng/mL

## 2021-12-11 LAB — MAGNESIUM: Magnesium: 1.9 mg/dL (ref 1.7–2.4)

## 2021-12-11 LAB — C-REACTIVE PROTEIN: CRP: 21 mg/dL — ABNORMAL HIGH (ref <1.0)

## 2021-12-11 LAB — BRAIN NATRIURETIC PEPTIDE: B Natriuretic Peptide: 263.6 pg/mL — ABNORMAL HIGH (ref 0.0–100.0)

## 2021-12-11 LAB — MRSA NEXT GEN BY PCR, NASAL: MRSA by PCR Next Gen: NOT DETECTED

## 2021-12-11 MED ORDER — ASPIRIN EC 81 MG PO TBEC
81.0000 mg | DELAYED_RELEASE_TABLET | Freq: Every morning | ORAL | Status: DC
  Administered 2021-12-11 – 2021-12-14 (×4): 81 mg via ORAL
  Filled 2021-12-11 (×4): qty 1

## 2021-12-11 MED ORDER — PANTOPRAZOLE SODIUM 40 MG IV SOLR
40.0000 mg | INTRAVENOUS | Status: DC
  Administered 2021-12-11 – 2021-12-14 (×4): 40 mg via INTRAVENOUS
  Filled 2021-12-11 (×4): qty 10

## 2021-12-11 MED ORDER — LABETALOL HCL 5 MG/ML IV SOLN: 10.0000 mg | INTRAVENOUS | Status: DC | PRN

## 2021-12-11 MED ORDER — DEXTROSE 5 % IV SOLN
600.0000 mg | Freq: Once | INTRAVENOUS | Status: AC
  Administered 2021-12-11: 600 mg via INTRAVENOUS
  Filled 2021-12-11: qty 12

## 2021-12-11 MED ORDER — METOPROLOL SUCCINATE ER 50 MG PO TB24
50.0000 mg | ORAL_TABLET | Freq: Two times a day (BID) | ORAL | Status: DC
  Administered 2021-12-11 – 2021-12-14 (×8): 50 mg via ORAL
  Filled 2021-12-11 (×8): qty 1

## 2021-12-11 MED ORDER — HYDRALAZINE HCL 20 MG/ML IJ SOLN: 10.0000 mg | Freq: Four times a day (QID) | INTRAMUSCULAR | Status: DC | PRN

## 2021-12-11 MED ORDER — LACTATED RINGERS IV SOLN
INTRAVENOUS | Status: AC
  Filled 2021-12-11 (×2): qty 1000

## 2021-12-11 MED ORDER — VANCOMYCIN HCL 500 MG/100ML IV SOLN
500.0000 mg | INTRAVENOUS | Status: DC
  Administered 2021-12-12 – 2021-12-13 (×2): 500 mg via INTRAVENOUS
  Filled 2021-12-11 (×2): qty 100

## 2021-12-11 MED ORDER — HEPARIN SODIUM (PORCINE) 5000 UNIT/ML IJ SOLN
5000.0000 [IU] | Freq: Three times a day (TID) | INTRAMUSCULAR | Status: DC
  Administered 2021-12-11 – 2021-12-14 (×11): 5000 [IU] via SUBCUTANEOUS
  Filled 2021-12-11 (×10): qty 1

## 2021-12-11 MED ORDER — PRAVASTATIN SODIUM 40 MG PO TABS
40.0000 mg | ORAL_TABLET | Freq: Every day | ORAL | Status: DC
  Administered 2021-12-11 – 2021-12-14 (×4): 40 mg via ORAL
  Filled 2021-12-11 (×4): qty 1

## 2021-12-11 MED ORDER — DEXTROSE 5 % IV SOLN
600.0000 mg | Freq: Three times a day (TID) | INTRAVENOUS | Status: DC
  Administered 2021-12-11 (×3): 600 mg via INTRAVENOUS
  Filled 2021-12-11 (×4): qty 12
  Filled 2021-12-11: qty 2
  Filled 2021-12-11 (×2): qty 12

## 2021-12-11 NOTE — Progress Notes (Signed)
Pt. Arrived via ED stretcher. Pt. Alert to self only. CCMD notified and MD orders carried out. CHG bath given with no breakdown noted. VSS.Marland Kitchenwill continue to monitor closely. Sister at bedside with all questions answered. ?

## 2021-12-11 NOTE — Plan of Care (Signed)

## 2021-12-11 NOTE — Progress Notes (Signed)
Pt currently in chair. Pt is uncomfortable. Family member in room states she would like to be placed in bed and cleaned up. Will attempt later when pt is situated in her bed.  ?

## 2021-12-11 NOTE — Progress Notes (Addendum)
Pharmacy Antimicrobial Consult ? ?Claire West is a 81 y.o. female admitted on 12/10/2021 with possible viral meningitis and cellulitis  Pharmacy has been consulted for vancomycin and acyclovir. Patient is also on high dose ceftriaxone.  ? ?Tmax 103.5. WBC 14.4, CRP 21, LA 3.3. Received vancomycin '500mg'$  at 6am. ClCr 53 ml/min. Low concern for bacterial meningitis per team.  ? ?Plan: ?Acyclovir '10mg'$ /kg (='600mg'$ ) Q8 hr ?LR with K at 46m/hr to prevent AKI from acyclovir  ?Vancomycin '500mg'$  q24h (trough goal 15-20) ?Consider decreasing ceftriaxone to 1g q24 hr if treating cellulitis only  ?Monitor cultures/CSF PCR, clinical status, renal function, vancomycin level ?Narrow abx as able and f/u duration  ?  ? ?Height: 5' 5.98" (167.6 cm) ?Weight: 61.2 kg (135 lb) (estimate based on previous weights) ?IBW/kg (Calculated) : 59.26 ? ?Temp (24hrs), Avg:99.8 ?F (37.7 ?C), Min:98.3 ?F (36.8 ?C), Max:103.5 ?F (39.7 ?C) ? ?Recent Labs  ?Lab 12/10/21 ?1500 12/10/21 ?1549 12/10/21 ?1557 12/10/21 ?1748 12/10/21 ?2354 12/11/21 ?0120 12/11/21 ?07939 ?WBC 15.5*  --   --   --   --  14.4*  --   ?CREATININE 0.84  --  0.50  --   --  0.61  --   ?LATICACIDVEN  --  4.1*  --  5.4* 3.6* 2.1* 3.3*  ? ?  ?Estimated Creatinine Clearance: 53.4 mL/min (by C-G formula based on SCr of 0.61 mg/dL).   ? ?Allergies  ?Allergen Reactions  ? Penicillins Anaphylaxis, Swelling and Rash  ?  Oral rash/peeling ? ?**Has tolerated Rocephin, Vantin since 2017** ? ?  ? Adhesive [Tape] Itching and Rash  ?  Irritation at site  ? Codeine Hives and Rash  ? Neosporin [Neomycin-Bacitracin Zn-Polymyx] Rash  ? Sulfa Antibiotics Hives and Rash  ? ? ?Antimicrobials: ?CTX 3/5>> ?Acyclovir 3/6 >>  ?Vanc 3/5>>  ?MTZ 3/5  ? ?Microbiology  ?3/5 Bcx: ngtd  ?3/5 Ucx: pending ?3/5 UA: mod Hgb, neg leuk/nitrites, WBC 6-10 ?3/5 CSF: clear, WBC 2, protein 51, glucose 59 (low) ?3/5 CSF HSV PCR : pending  ?3/6 MRSA: pend  ? ? ?Thank you for allowing pharmacy to be a part of this patient?s  care. ? ?LBenetta Spar PharmD, BCPS, BCCP ?Clinical Pharmacist ? ?Please check AMION for all MLymanphone numbers ?After 10:00 PM, call [phone]? ?

## 2021-12-11 NOTE — Evaluation (Signed)
Occupational Therapy Evaluation and Discharge ?Patient Details ?Name: Claire West ?MRN: [id number] ?DOB: 1940-11-16 ?Today's Date: 12/11/2021 ? ? ?History of Present Illness Claire West is a 81 y.o. female became more confused lethargic less responsive and was brought to the ER. MRI: Encephalomalacia in the right parietal, occipital, and inferior  temporal lobes,consistent with expected evolution of the previously noted right PCA territory infarct. PHMx:Lewy body dementia and vascular dementia, hypertension, CVA (residual left side deficits), hyperlipidemia was recently treated for UTI with patient complaining of dysuria for the last 2 days.  ? ?Clinical Impression ?  ?This 81 yo female admitted with above presents to acute OT with PLOF of being able to converse and help feed/groom self at times; otherwise was total A for basic ADLs and mobility (hoyer lift). Currently she is at some level for mobility and needs more A for grooming and self feeding than her baseline. Recommend family and staff continue to help her with these tasks with potentially her being able to get back to doing some of these on her own again in her own familiar environment and people she knows. Acute OT will sign off ?   ? ?Recommendations for follow up therapy are one component of a multi-disciplinary discharge planning process, led by the attending physician.  Recommendations may be updated based on patient status, additional functional criteria and insurance authorization.  ? ?Follow Up Recommendations ? No OT follow up  ?  ?Assistance Recommended at Discharge Frequent or constant Supervision/Assistance  ?Patient can return home with the following Two people to help with walking and/or transfers;Two people to help with bathing/dressing/bathroom;Assistance with cooking/housework;Assistance with feeding;Help with stairs or ramp for entrance;Assist for transportation;Direct supervision/assist for financial management;Direct  supervision/assist for medications management ? ?  ?Functional Status Assessment ? Patient has had a recent decline in their functional status and/or demonstrates limited ability to make significant improvements in function in a reasonable and predictable amount of time  ?Equipment Recommendations ? None recommended by OT  ?  ?   ?Precautions / Restrictions Precautions ?Precautions: Fall ?Restrictions ?Weight Bearing Restrictions: No  ? ?  ? ?Mobility Bed Mobility ?Overal bed mobility: Needs Assistance ?Bed Mobility: Rolling ?Rolling: +2 for physical assistance, Total assist ?  ?  ?  ?  ?  ?  ? ?Transfers ?Overall transfer level: Needs assistance ?  ?  ?  ?  ?  ?  ?  ?  ?General transfer comment: total A+2 with maxi move ?  ? ?  ?   ? ?ADL either performed or assessed with clinical judgement  ? ?ADL   ?  ?  ?  ?  ?  ?  ?  ?  ?  ?  ?  ?  ?  ?  ?  ?  ?  ?  ?  ?General ADL Comments: total A  ? ? ? ?Vision Baseline Vision/History: 1 Wears glasses ?Additional Comments: eyes closed 95% of session  ?   ?   ?   ? ?Pertinent Vitals/Pain Pain Assessment ?Pain Assessment: PAINAD ?Breathing: normal ?Negative Vocalization: occasional moan/groan, low speech, negative/disapproving quality ?Facial Expression: sad, frightened, frown ?Body Language: tense, distressed pacing, fidgeting ?Consolability: distracted or reassured by voice/touch ?PAINAD Score: 4 ?Pain Intervention(s): Repositioned  ? ? ? ?Hand Dominance Right ?  ?Extremity/Trunk Assessment Upper Extremity Assessment ?Upper Extremity Assessment: Generalized weakness ?  ?  ?  ?  ?  ?Communication Communication ?Communication: Receptive difficulties;Expressive difficulties ?  ?Cognition Arousal/Alertness: Awake/alert (but kept  eyes closed most of session) ?Behavior During Therapy: Flat affect ?Overall Cognitive Status: History of cognitive impairments - at baseline ?  ?  ?  ?  ?  ?  ?  ?  ?  ?  ?  ?  ?  ?  ?  ?  ?General Comments: usually more verbal than currently is ?  ?   ?General Comments  Does not sit EOB or stand normally (hoyered in and out of bed to geri chair) ? ?  ?   ?   ? ? ?Home Living Family/patient expects to be discharged to:: Skilled nursing facility ?  ?  ?  ?  ?  ?  ?  ?  ?  ?  ?  ?  ?  ?  ?  ?  ?  ?  ? ?  ?Prior Functioning/Environment   ?  ?  ?  ?  ?  ?  ?Mobility Comments: hoyer lift OOB into geri chair ?ADLs Comments: total A except could A with washing face and some self feeding ?  ? ?  ?  ?OT Problem List: Decreased strength;Decreased range of motion;Impaired balance (sitting and/or standing);Impaired vision/perception;Impaired UE functional use;Pain ?  ?   ?   ?OT Goals(Current goals can be found in the care plan section) Acute Rehab OT Goals ?Patient Stated Goal: unable to state  ?   ? ?   ?AM-PAC OT "6 Clicks" Daily Activity     ?Outcome Measure Help from another person eating meals?: Total ?Help from another person taking care of personal grooming?: Total ?Help from another person toileting, which includes using toliet, bedpan, or urinal?: Total ?Help from another person bathing (including washing, rinsing, drying)?: Total ?Help from another person to put on and taking off regular upper body clothing?: Total ?Help from another person to put on and taking off regular lower body clothing?: Total ?6 Click Score: 6 ?  ?End of Session Nurse Communication: Mobility status;Need for lift equipment (RUE IV site needs to be looked at) ? ?Activity Tolerance:  (limited by cognition--wanted to be left alone) ?Patient left: in chair;with call bell/phone within reach;with chair alarm set;with family/visitor present ? ?OT Visit Diagnosis: Pain ?Hemiplegia - Right/Left: Left ?Hemiplegia - dominant/non-dominant: Non-Dominant ?Hemiplegia - caused by: Cerebral infarction (old) ?Pain - Right/Left: Right ?Pain - part of body: Leg (with some movements)  ?              ?Time: 3710-6269 ?OT Time Calculation (min): 33 min ?Charges:  OT General Charges ?$OT Visit: 1 Visit ?OT  Evaluation ?$OT Eval Moderate Complexity: 1 Mod ? ?Golden Circle, OTR/L ?Acute Rehab Services ?Pager [phone] ?Office [phone] ? ? ? ?Almon Register ?12/11/2021, 9:53 AM ?

## 2021-12-11 NOTE — Consult Note (Signed)
Neurology Consult H&P ? ?QUINCEY NORED ?MR# [id number] ?12/11/2021 ? ? ?CC: Encephalopathy ? ?History is obtained from: Daughter, son and chart. ? ?HPI: Claire West is a 81 y.o. female lives in a home PMHx as reviewed below, mixed vascular/Lewy body dementia, prior stroke with residual left-sided deficits presented with confusion.  While in the ED she was noted to be febrile and suspicion for meningitis.  LP performed and started on prophylactic antibiotic/antiviral. ? ?2 days ago was diagnosed with urinary tract infection and started Macrobid only received 2 doses however over the past 24 hours she was noted to be more lethargic and less interactive.  At baseline she is alert oriented to self and family, and can hold a reasonably good conversation. ? ?ROS: Unable to assess due to encephalopathy. ? ?Past Medical History:  ?Diagnosis Date  ? Arthritis   ? Depression   ? Gait instability   ? walks with cane  ? GERD (gastroesophageal reflux disease)   ? History of blood transfusion   ? Hyperlipemia   ? Hypertension   ? Knee pain   ? Metabolic encephalopathy   ? Mild cognitive impairment with memory loss   ? Seasonal allergies   ? Skin cancer   ? Stroke [facility])   ? TIA (transient ischemic attack)   ? ?Family History  ?Problem Relation Age of Onset  ? Lung cancer Mother   ? Lung cancer Sister   ? ?Social History:  reports that she has never smoked. She has never used smokeless tobacco. She reports that she does not currently use alcohol. She reports that she does not use drugs. ? ? ?Prior to Admission medications   ?Medication Sig Start Date End Date Taking? Authorizing Provider  ?acetaminophen (TYLENOL) 325 MG tablet Take 650 mg by mouth 3 (three) times daily.   Yes [provider]  ?amLODipine (NORVASC) 10 MG tablet Take 10 mg by mouth daily. 12/04/21  Yes [provider]  ?ARTIFICIAL TEAR OP Place 2 drops into both eyes in the morning and at bedtime.   Yes [provider]  ?aspirin EC 81  MG tablet Take 81 mg by mouth every morning.    Yes [provider]  ?busPIRone (BUSPAR) 5 MG tablet Take 5 mg by mouth 2 (two) times daily. 03/12/21  Yes [provider]  ?calcium carbonate (TUMS - DOSED IN MG ELEMENTAL CALCIUM) 500 MG chewable tablet Chew 1 tablet by mouth daily.   Yes [provider]  ?cholecalciferol (VITAMIN D) 25 MCG (1000 UNIT) tablet Take 1,000 Units by mouth daily at 12 noon.   Yes [provider]  ?escitalopram (LEXAPRO) 20 MG tablet Take 20 mg by mouth daily. 03/07/18  Yes [provider]  ?famotidine (PEPCID) 20 MG tablet Take 20 mg by mouth every evening. 11/29/21  Yes [provider]  ?fexofenadine (ALLEGRA) 180 MG tablet Take 180 mg by mouth daily as needed for allergies or rhinitis.   Yes [provider]  ?HYDROcodone-acetaminophen (NORCO/VICODIN) 5-325 MG tablet Take 1 tablet by mouth 2 (two) times daily as needed for moderate pain.   Yes [provider]  ?LACTOBACILLUS PO Take 10 Billion Cells by mouth daily.   Yes [provider]  ?magnesium oxide (MAG-OX) 400 MG tablet Take 400 mg by mouth at bedtime.   Yes [provider]  ?Menthol, Topical Analgesic, (BIOFREEZE) 4 % GEL Apply 1 application topically 3 (three) times daily. Right hip, knees and or any area of pain  Yes [provider]  ?metoprolol succinate (TOPROL-XL) 50 MG 24 hr tablet Take 50 mg by mouth 2 (two) times daily. 03/12/21  Yes [provider]  ?Multiple Vitamins-Minerals (CERTAVITE SENIOR PO) Take 1 tablet by mouth daily.   Yes [provider]  ?nitrofurantoin, macrocrystal-monohydrate, (MACROBID) 100 MG capsule Take 100 mg by mouth See admin instructions. Bid x 7 days   Yes [provider]  ?olmesartan (BENICAR) 40 MG tablet Take 40 mg by mouth daily. 01/13/21  Yes [provider]  ?polyethylene glycol (MIRALAX / GLYCOLAX) 17 g packet Take 17 g by mouth every Monday, Wednesday, and  Friday.   Yes [provider]  ?potassium chloride (MICRO-K) 10 MEQ CR capsule Take 10 mEq by mouth daily. 11/20/21  Yes [provider]  ?pravastatin (PRAVACHOL) 40 MG tablet Take 40 mg by mouth at bedtime.   Yes [provider]  ?clopidogrel (PLAVIX) 75 MG tablet Take 1 tablet (75 mg total) by mouth daily. ?Patient not taking: Reported on 12/08/2021 12/29/19   Swayze, Ava, DO  ?guaiFENesin-dextromethorphan (ROBITUSSIN DM) 100-10 MG/5ML syrup Take 2.5-5 mLs by mouth every 6 (six) hours as needed for cough. ?Patient not taking: Reported on 12/10/2021 04/13/20   Davonna Belling, MD  ? ? ?Exam: ?Current vital signs: ?BP 128/77   Pulse 91   Temp 98.3 ?F (36.8 ?C) (Oral)   Resp 18   Ht 5' 5.98" (1.676 m)   Wt 61.2 kg Comment: estimate based on previous weights  SpO2 96%   BMI 21.80 kg/m?  ? ?Physical Exam  ?Constitutional: Appears well-developed and well-nourished.  ?Psych: Affect appropriate to situation ?Eyes: No scleral injection ?HENT: No OP obstruction. ?Head: Normocephalic.  ?Cardiovascular: Normal rate and regular rhythm.  ?Respiratory: Effort normal, symmetric excursions bilaterally, no audible wheezing. ?GI: Soft.  No distension. There is no tenderness.  ?Skin: WDI ? ?Neuro: ?Mental Status: Obtunded and can say yes withdraws to stimulation.   ?Visual Fields are full. Pupils are equal, round, and reactive to light. ?Doll's eye (-) ?Facial sensation is symmetric to temperature ?Facial movement is symmetric.  ?Hearing is intact to voice. ?Tone is increased in the left upper extremity greater than lower. Bulk is normal.  Not following commands unable to participate. ?Sensation is symmetric to noxious stimulus ?Deep Tendon Reflexes: Unable to elicit toes are up going left. ?FNF and HKS unable to participate ?Gait - Deferred ? ?I have reviewed labs in epic and the pertinent results are: ?Venous lactate 3.6, WBC 14 without left shift. ?UA pH 9 ?CSF 1 RBC, protein 51 ? ?I have reviewed the  images obtained: ?MRI brain MRA head and neck showed No acute intracranial process. Severe focal stenosis in the proximal left P2 segment, which is likely similar to the prior exam, although that exam was limited by motion. Poor signal in the proximal right P3 segments, without visualization of the distal right P3s, which are likely significantly stenosed or occluded. Encephalomalacia in the right parietal, occipital, and inferior temporal lobes, consistent with expected evolution of the previously noted right PCA territory infarct. ? ?Assessment: IZABELL SCHALK is a 81 y.o. female PMHx as noted above, mixed vascular/Lewy body dementia recently diagnosed with UTI presents with metabolic encephalopathy likely secondary to urosepsis.  While in the ED she spiked a fever of 103 F she underwent LP which was nonsuggestive and received prophylactic antibiotics/NT viral. ? ?On exam she was obtunded however she received vancomycin, metronidazole, ceftriaxone which are likely contributing to the encephalopathy. No witnessed  seizure-like activity on examination and her vital signs were stable.  Her heart rate increased slightly with sternal rub then decreased back to baseline.   ? 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 ?Plan: ?- Continue to treat infection. ?- She was found to have severe focal stenotic disease of the posterior intracranial vessels and will likely need aspirin 325 daily. ?-If she remains encephalopathic will consider routine EEG. ? ?Electronically signed by:  ?Lynnae Sandhoff, MD ?Page: [phone] ?12/11/2021, 2:14 AM ? ?If 7pm- 7am, please page neurology on call as listed in [place]. ? ?

## 2021-12-11 NOTE — Progress Notes (Signed)
Pharmacy Antimicrobial Consult ? ?Claire West is a 81 y.o. female admitted on 12/10/2021 with concern for meningitis.  Pharmacy has been consulted to add acyclovir to ABX regimen. ? ?Plan: ?Acyclovir '600mg'$  IV Q8H. ? ?Height: 5' 5.98" (167.6 cm) ?Weight: 61.2 kg (135 lb) (estimate based on previous weights) ?IBW/kg (Calculated) : 59.26 ? ?Temp (24hrs), Avg:100.9 ?F (38.3 ?C), Min:98.3 ?F (36.8 ?C), Max:103.5 ?F (39.7 ?C) ? ?Recent Labs  ?Lab 12/10/21 ?1500 12/10/21 ?1549 12/10/21 ?1557 12/10/21 ?1748  ?WBC 15.5*  --   --   --   ?CREATININE 0.84  --  0.50  --   ?LATICACIDVEN  --  4.1*  --  5.4*  ?  ?Estimated Creatinine Clearance: 53.4 mL/min (by C-G formula based on SCr of 0.5 mg/dL).   ? ?Allergies  ?Allergen Reactions  ? Penicillins Anaphylaxis, Swelling and Rash  ?  Oral rash/peeling ? ?**Has tolerated Rocephin, Vantin since 2017** ? ?  ? Adhesive [Tape] Itching and Rash  ?  Irritation at site  ? Codeine Hives and Rash  ? Neosporin [Neomycin-Bacitracin Zn-Polymyx] Rash  ? Sulfa Antibiotics Hives and Rash  ? ? ?Thank you for allowing pharmacy to be a part of this patient?s care. ? ?Wynona Neat, PharmD, BCPS  ?12/11/2021 12:20 AM ? ?

## 2021-12-11 NOTE — Evaluation (Signed)
Clinical/Bedside Swallow Evaluation ?Patient Details  ?Name: VANASSA PENNIMAN ?MRN: [id number] ?Date of Birth: [birth year] ? ?Today's Date: 12/11/2021 ?Time: SLP Start Time (ACUTE ONLY): 0921 SLP Stop Time (ACUTE ONLY): 0933 ?SLP Time Calculation (min) (ACUTE ONLY): 12 min ? ?Past Medical History:  ?Past Medical History:  ?Diagnosis Date  ? Arthritis   ? Depression   ? Gait instability   ? walks with cane  ? GERD (gastroesophageal reflux disease)   ? History of blood transfusion   ? Hyperlipemia   ? Hypertension   ? Knee pain   ? Metabolic encephalopathy   ? Mild cognitive impairment with memory loss   ? Seasonal allergies   ? Skin cancer   ? Stroke [facility])   ? TIA (transient ischemic attack)   ? ?Past Surgical History:  ?Past Surgical History:  ?Procedure Laterality Date  ? ABDOMINAL HYSTERECTOMY    ? ARTHROSCOPIC REPAIR ACL Left   ? COLONOSCOPY    ? COLONOSCOPY N/A 11/23/2015  ? Procedure: COLONOSCOPY;  Surgeon: Rogene Houston, MD;  Location: [facility];  Service: Endoscopy;  Laterality: N/A;  830  ? EYE SURGERY Bilateral   ? Cataracts  ? SKIN CANCER EXCISION    ? Right shoulder  ? TOTAL KNEE ARTHROPLASTY Left 05/05/2018  ? Procedure: LEFT TOTAL KNEE ARTHROPLASTY;  Surgeon: Gaynelle Arabian, MD;  Location: [facility];  Service: Orthopedics;  Laterality: Left;  with block  ? TOTAL KNEE ARTHROPLASTY Right 06/22/2019  ? Procedure: TOTAL KNEE ARTHROPLASTY;  Surgeon: Gaynelle Arabian, MD;  Location: [facility];  Service: Orthopedics;  Laterality: Right;  32mn  ? ?HPI:  ?GMARYBELLE GIRALDOis a 81y.o. female[facility] with confusion, fever and suspicion for meningitis. Dx sepsis, source unclear. LP results are pending. PMHx mixed vascular/Lewy body dementia, prior stroke with residual left-sided deficits. She is total care for ADLs except feeding.  ?  ?Assessment / Plan / Recommendation  ?Clinical Impression ? Pt sitting in recliner- confused, but participatory. Sister present. Her sister  reports no baseline swallowing difficulty.  Pt accepted sips of water, masticated crackers and ate applesauce with good oral attention, brisk swallow response, and no s/s of aspiration. No dysphagia identified. Recommend regular solids; thin liquids. No SLP f/u is needed. Our service will sign off. ?SLP Visit Diagnosis: Dysphagia, unspecified (R13.10) ?   ?Aspiration Risk ? No limitations  ?  ?Diet Recommendation   Regular solids, thin liquids ? ?Medication Administration: Whole meds with liquid  ?  ?Other  Recommendations Oral Care Recommendations: Oral care BID   ? ?Recommendations for follow up therapy are one component of a multi-disciplinary discharge planning process, led by the attending physician.  Recommendations may be updated based on patient status, additional functional criteria and insurance authorization. ? ?Follow up Recommendations No SLP follow up  ? ? ?  ? ? ?Swallow Study   ?General HPI: GZILPHA MCANDREWis a 81y.o. female, [facility] who presented to ED with confusion, fever and suspicion for meningitis. Dx sepsis, source unclear. LP results are pending. PMHx mixed vascular/Lewy body dementia, prior stroke with residual left-sided deficits. She is total care for ADLs except feeding. ?Type of Study: Bedside Swallow Evaluation ?Previous Swallow Assessment: no ?Diet Prior to this Study: NPO ?Temperature Spikes Noted: No ?Respiratory Status: Nasal cannula ?History of Recent Intubation: No ?Behavior/Cognition: Alert;Confused ?Oral Cavity Assessment: Within Functional Limits ?Oral Care Completed by SLP: No ?Oral Cavity - Dentition: Adequate  natural dentition ?Vision: Functional for self-feeding ?Self-Feeding Abilities: Needs assist ?Patient Positioning: Upright in chair ?Baseline Vocal Quality: Normal ?Volitional Cough: Strong ?Volitional Swallow: Able to elicit  ?  ?Oral/Motor/Sensory Function Overall Oral Motor/Sensory Function: Within functional limits   ?Ice Chips Ice  chips: Within functional limits   ?Thin Liquid Thin Liquid: Within functional limits  ?  ?Nectar Thick Nectar Thick Liquid: Not tested   ?Honey Thick Honey Thick Liquid: Not tested   ?Puree Puree: Within functional limits   ?Solid ? ? ?  Solid: Within functional limits  ? ?  ? ?Juan Quam Laurice ?12/11/2021,9:34 AM ? ?Sheng Pritz L. Deshawna Mcneece, MA CCC/SLP ?Acute Rehabilitation Services ?Office number [phone] ?Pager [phone] ? ? ?

## 2021-12-11 NOTE — TOC Initial Note (Signed)
Transition of Care (TOC) - Initial/Assessment Note  ? ? ?Patient Details  ?Name: Claire West ?MRN: [id number] ?Date of Birth: 1940-12-29 ? ?Transition of Care (TOC) CM/SW Contact:    ?Vinie Sill, LCSW ?Phone Number: ?12/11/2021, 1:06 PM ? ?Clinical Narrative:                 ? ?CSW spoke with patient's son [name]. CSW introduced and explained role. He confirmed patient is from [facility]) and family expects her to return once medically stable.  CSW explained the SNF process. Family states no questions or concerns at this time.  ? ?TOC will continue to follow and assist with discharge planning. ? ?Thurmond Butts, MSW, LCSW ?Clinical Social Worker ? ? ? ?Expected Discharge Plan: [place] ?Barriers to Discharge: Continued Medical Work up ? ? ?Patient Goals and CMS Choice ?  ?  ?  ? ?Expected Discharge Plan and Services ?Expected Discharge Plan: [place] ?In-house Referral: Clinical Social Work ?  ?  ?Living arrangements for the past 2 months: [place] ?                ?  ?  ?  ?  ?  ?  ?  ?  ?  ?  ? ?Prior Living Arrangements/Services ?Living arrangements for the past 2 months: [place] ?Lives with:: Self ?Patient language and need for interpreter reviewed:: No ?       ?Need for Family Participation in Patient Care: Yes (Comment) ?Care giver support system in place?: Yes (comment) ?  ?Criminal Activity/Legal Involvement Pertinent to Current Situation/Hospitalization: No - Comment as needed ? ?Activities of Daily Living ?  ?  ? ?Permission Sought/Granted ?  ?  ?   ?   ?   ?   ? ?Emotional Assessment ?  ?  ?  ?Orientation: : Oriented to Self ?Alcohol / Substance Use: Not Applicable ?Psych Involvement: No (comment) ? ?Admission diagnosis:  Lactic acidosis [E87.20] ?Sepsis ([place]) [A41.9] ?Fever, unspecified fever cause [R50.9] ?Altered mental status, unspecified altered mental status type [R41.82] ?Leukocytosis, unspecified type  [D72.829] ?Sepsis, due to unspecified organism, unspecified whether acute organ dysfunction present ([place]) [A41.9] ?Patient Active Problem List  ? Diagnosis Date Noted  ? Sepsis ([place]) 12/10/2021  ? Palliative care encounter 12/08/2021  ? Urinary tract infection symptoms 12/08/2021  ? Acute blood loss as cause of postoperative anemia 06/29/2020  ? Closed right hip fracture ([place]) 06/27/2020  ? 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 ([place]) 06/09/2020  ? Syncope 03/15/2020  ? History of ischemic right PCA stroke 02/18/2020  ? Acute CVA (cerebrovascular accident) ([place]) 12/25/2019  ? Restlessness and agitation 12/17/2019  ? Vascular dementia with behavior disturbance 12/17/2019  ? History of total right knee replacement 07/27/2019  ? Mild cognitive impairment 06/25/2019  ? Depression 06/25/2019  ? Impingement syndrome of right shoulder region 02/05/2019  ? Overriding toes 12/15/2018  ? Falls frequently 10/28/2018  ? TIA (transient ischemic attack) 08/23/2018  ? Delirium in remission 07/07/2018  ? OA (osteoarthritis) of knee 05/05/2018  ? Fracture of distal end of radius 02/12/2018  ? Pain in right hand 01/27/2018  ? Amnestic MCI (mild cognitive impairment with memory loss) 12/24/2017  ? Acute metabolic encephalopathy 28/00/3491  ? Altered mental status 11/06/2017  ? Arthritis of both knees 10/29/2017  ? Hyponatremia 02/12/2016  ? Hypokalemia 02/12/2016  ? Dehydration 02/12/2016  ? Elevated LFTs 02/12/2016  ? HTN (hypertension) 02/12/2016  ?  GERD (gastroesophageal reflux disease) 02/12/2016  ? ?PCP:  Sharilyn Sites, MD ?Pharmacy:   ?[place] [place] [address] ?Phone: [phone] Fax: [phone] ? ?[place] ?[address] ?[place] [address] ?Phone: [phone] Fax: [phone] ? ?[place] ?[address] ?[place] [address] ?Phone:  [phone] Fax: [phone] ? ? ? ? ?Social Determinants of Health (SDOH) Interventions ?  ? ?Readmission Risk Interventions ?No flowsheet data found. ? ? ?

## 2021-12-11 NOTE — Evaluation (Signed)
Physical Therapy Evaluation ?Patient Details ?Name: Claire West ?MRN: [id number] ?DOB: 01/16/1941 ?Today's Date: 12/11/2021 ? ?History of Present Illness ? Patient is a 81 y.o. female who was admitted on 12/10/21 from SNF with AMS and decreased responsiveness. Admitted with likely urosepsis and cellulitis of left breast implant. MRI: Encephalomalacia in the right parietal, occipital, and inferior  temporal lobes,consistent with expected evolution of the previously noted right PCA territory infarct. PHMx:Lewy body dementia and vascular dementia, HTN, CVA (residual left side deficits), recently treated for UTI.  ?Clinical Impression ? Patient is from SNF and is total care for all ADLs and mobility. Pt is able to feed self and wash face but the facility uses hoyer lift to transfer pt to geri-chair. Sister, present in room, provides history/PLOF. Reports pt is more vocal today and irritable. Pt requires total A for rolling and use of maximove to transfer to chair. Sp02 remained in 90s on RA with activity. Pt is functioning at baseline and does not require skilled therapy services. Recommend nursing continue to transfer pt to chair using lift while in the hospital. Discharge from therapy.   ?   ? ?Recommendations for follow up therapy are one component of a multi-disciplinary discharge planning process, led by the attending physician.  Recommendations may be updated based on patient status, additional functional criteria and insurance authorization. ? ?Follow Up Recommendations No PT follow up ? ?  ?Assistance Recommended at Discharge Frequent or constant Supervision/Assistance  ?Patient can return home with the following ?   ? ?  ?Equipment Recommendations None recommended by PT  ?Recommendations for Other Services ?    ?  ?Functional Status Assessment Patient has not had a recent decline in their functional status  ? ?  ?Precautions / Restrictions Precautions ?Precautions: Fall ?Restrictions ?Weight Bearing  Restrictions: No  ? ?  ? ?Mobility ? Bed Mobility ?Overal bed mobility: Needs Assistance ?Bed Mobility: Rolling ?Rolling: +2 for physical assistance, Total assist ?  ?  ?  ?  ?  ?  ? ?Transfers ?Overall transfer level: Needs assistance ?  ?Transfers: Bed to chair/wheelchair/BSC ?  ?  ?  ?  ?  ?  ?General transfer comment: total A+2 with maxi move to chair ?Transfer via Lift Equipment: Maximove ? ?Ambulation/Gait ?  ?  ?  ?  ?  ?  ?  ?General Gait Details: non ambulatory ? ?Stairs ?  ?  ?  ?  ?  ? ?Wheelchair Mobility ?  ? ?Modified Rankin (Stroke Patients Only) ?  ? ?  ? ?Balance   ?  ?  ?  ?  ?  ?  ?  ?  ?  ?  ?  ?  ?  ?  ?  ?  ?  ?  ?   ? ? ? ?Pertinent Vitals/Pain Pain Assessment ?Pain Assessment: PAINAD ?Breathing: normal ?Negative Vocalization: occasional moan/groan, low speech, negative/disapproving quality ?Facial Expression: sad, frightened, frown ?Body Language: tense, distressed pacing, fidgeting ?Consolability: distracted or reassured by voice/touch ?PAINAD Score: 4 ?Pain Intervention(s): Repositioned  ? ? ?Home Living Family/patient expects to be discharged to:: Skilled nursing facility ?  ?  ?  ?  ?  ?  ?  ?  ?  ?   ?  ?Prior Function   ?  ?  ?  ?  ?  ?  ?Mobility Comments: hoyer lift OOB into geri chair ?ADLs Comments: total A except could A with washing face and some self feeding ?  ? ? ?  Hand Dominance  ? Dominant Hand: Right ? ?  ?Extremity/Trunk Assessment  ? Upper Extremity Assessment ?Upper Extremity Assessment: Defer to OT evaluation ?  ? ?Lower Extremity Assessment ?Lower Extremity Assessment: Generalized weakness (decreased AROM throughout, right hip surgery in 2021) ?  ? ?   ?Communication  ? Communication: Receptive difficulties;Expressive difficulties  ?Cognition Arousal/Alertness: Awake/alert ?Behavior During Therapy: Flat affect ?Overall Cognitive Status: History of cognitive impairments - at baseline ?  ?  ?  ?  ?  ?  ?  ?  ?  ?  ?  ?  ?  ?  ?  ?  ?General Comments: usually more verbal  than currently is, eyes remained closed for most of session. ?  ?  ? ?  ?General Comments General comments (skin integrity, edema, etc.): Sister present. Sp02 remained >93% on RA throughout session to kept on RA. ? ?  ?Exercises    ? ?Assessment/Plan  ?  ?PT Assessment Patient does not need any further PT services;All further PT needs can be met in the next venue of care  ?PT Problem List Decreased strength;Decreased range of motion;Decreased mobility;Pain;Decreased balance;Decreased cognition ? ?   ?  ?PT Treatment Interventions     ? ?PT Goals (Current goals can be found in the Care Plan section)  ?Acute Rehab PT Goals ?Patient Stated Goal: none stated ?PT Goal Formulation: Patient unable to participate in goal setting ? ?  ?Frequency   ?  ? ? ?Co-evaluation PT/OT/SLP Co-Evaluation/Treatment: Yes ?Reason for Co-Treatment: To address functional/ADL transfers;Necessary to address cognition/behavior during functional activity ?PT goals addressed during session: Mobility/safety with mobility ?  ?  ? ? ?  ?AM-PAC PT "6 Clicks" Mobility  ?Outcome Measure Help needed turning from your back to your side while in a flat bed without using bedrails?: Total ?Help needed moving from lying on your back to sitting on the side of a flat bed without using bedrails?: Total ?Help needed moving to and from a bed to a chair (including a wheelchair)?: Total ?Help needed standing up from a chair using your arms (e.g., wheelchair or bedside chair)?: Total ?Help needed to walk in hospital room?: Total ?Help needed climbing 3-5 steps with a railing? : Total ?6 Click Score: 6 ? ?  ?End of Session   ?Activity Tolerance: Patient tolerated treatment well ?Patient left: in chair;with call bell/phone within reach;with chair alarm set;with family/visitor present;with nursing/sitter in room ?Nurse Communication: Mobility status;Need for lift equipment ?PT Visit Diagnosis: Muscle weakness (generalized) (M62.81) ?  ? ?Time: 0724-0800 ?PT Time  Calculation (min) (ACUTE ONLY): 36 min ? ? ?Charges:   PT Evaluation ?$PT Eval Moderate Complexity: 1 Mod ?  ?  ?   ? ? ?Marisa Severin, PT, DPT ?Acute Rehabilitation Services ?Pager [phone] ?Office [phone] ? ? ? ? ?[place] ?12/11/2021, 9:57 AM ? ?

## 2021-12-11 NOTE — Progress Notes (Signed)
PROGRESS NOTE                                                                                                                                                                                                             Patient Demographics:    Claire West, is a 81 y.o. female, DOB - [birth year], TDV:[id number]  Outpatient Primary MD for the patient is Sharilyn Sites, MD    LOS - 1  Admit date - 12/10/2021    Chief Complaint  Patient presents with   Altered Mental Status       Brief Narrative (HPI from H&P)  - 81 y.o. female with history of Lewy body dementia and vascular dementia, hypertension, hyperlipidemia, previous stroke with left-sided weakness, wheelchair and bedbound at SNF, was recently treated for UTI with patient complaining of dysuria for the last 2 days.  This afternoon patient became more confused lethargic less responsive and was brought to the ER.  The ER she was admitted for sepsis, she was found to have UTI underwent LP to rule out meningitis.  She is was seen by neurology and hospitalist team was requested to admit the patient.   Subjective:    Claire West today had somnolent in no distress, unable to answer questions reliably   Assessment  & Plan :    Toxic encephalopathy due to sepsis with most likely source being UTI -  she has been adequately hydrated currently on empiric IV antibiotics, sepsis pathophysiology seems to have resolved however mentation is not back to baseline yet although somewhat improved according to sister bedside.  Follow cultures continue present empiric antibiotic regimen, CSF findings discussed with Dr. Lynnae Sandhoff does not represent a bacterial infection however we cannot rule out viral encephalitis completely.  We will continue acyclovir for till we have ruled out viral infection.  Will get EEG to rule out underlying subclinical seizures.  Continue Rocephin for UTI, and  vancomycin for today, check MRSA nasal PCR as well and follow cultures.    2.  Hx of CVA with left-sided hemiparesis.  Continue aspirin and statin for secondary prevention as tolerated, she is not taking Plavix anymore.  No acute issues on MRI of the brain.  PT, OT and speech to follow-up.  Note patient is largely bedbound and lives at an SNF.  3.  Dyslipidemia.  On  statin.  4.  Elevated D-dimer.  CTA chest negative.  Continue prophylactic heparin and check lower extremity venous duplex.   5.  Hypertension.  Beta-blocker as tolerated along with as needed IV hydralazine.  6.  Early dementia.  Supportive care at risk for delirium.   7. Incidental CT finding - ?  Cellulitis around the left breast.  No fluid collection.  Continue vancomycin and above antibiotics and monitor.       Condition - Extremely Guarded  Family Communication  :  sister bedside 12/11/21  Code Status :  Full  Consults  :  Neuro  PUD Prophylaxis : PPI   Procedures  :     CT Abd  - Pelvis - 1. Bilateral nonobstructing renal calculi. There may be slight thickening and enhancement of the renal collecting systems and ureters, suggesting urinary tract infection. No renal fluid collection. 2. Subcutaneous stranding medial to the left breast implant in the left anterior chest wall, may be infectious or inflammatory. No drainable collection or soft tissue gas. Recommend correlation with physical exam. Aortic Atherosclerosis (ICD10-I70.0)  MRI-A -  1. No acute intracranial process. 2. Severe focal stenosis in the proximal left P2 segment, which is likely similar to the prior exam, although that exam was limited by motion. 3. Poor signal in the proximal right P3 segments, without visualization of the distal right P3s, which are likely significantly stenosed or occluded. 4. Encephalomalacia in the right parietal, occipital, and inferior temporal lobes, consistent with expected evolution of the previously noted right PCA territory  infarct.      Disposition Plan  :    Status is: Inpatient    DVT Prophylaxis  :     Heparin - SCDs    Lab Results  Component Value Date   PLT 164 12/11/2021    Diet :  Diet Order             Diet NPO time specified  Diet effective now                    Inpatient Medications  Scheduled Meds:  aspirin EC  81 mg Oral q morning   metoprolol succinate  50 mg Oral BID   pravastatin  40 mg Oral QHS   Continuous Infusions:  acyclovir 600 mg (12/11/21 0834)   cefTRIAXone (ROCEPHIN)  IV 2 g (12/11/21 0530)   lactated ringers with kcl 75 mL/hr at 12/11/21 0829   PRN Meds:.acetaminophen **OR** acetaminophen, hydrALAZINE, labetalol  Antibiotics  :    Anti-infectives (From admission, onward)    Start     Dose/Rate Route Frequency Ordered Stop   12/11/21 0800  acyclovir (ZOVIRAX) 600 mg in dextrose 5 % 100 mL IVPB        600 mg 112 mL/hr over 60 Minutes Intravenous Every 8 hours 12/11/21 0025     12/11/21 0700  vancomycin (VANCOREADY) IVPB 500 mg/100 mL  Status:  Discontinued        500 mg 100 mL/hr over 60 Minutes Intravenous Every 12 hours 12/10/21 1717 12/11/21 0903   12/11/21 0600  cefTRIAXone (ROCEPHIN) 2 g in sodium chloride 0.9 % 100 mL IVPB        2 g 200 mL/hr over 30 Minutes Intravenous Every 12 hours 12/10/21 2316     12/11/21 0030  acyclovir (ZOVIRAX) 600 mg in dextrose 5 % 100 mL IVPB        600 mg 112 mL/hr over 60 Minutes Intravenous  Once 12/11/21 0025  12/11/21 0316   12/10/21 1600  aztreonam (AZACTAM) 2 g in sodium chloride 0.9 % 100 mL IVPB  Status:  Discontinued        2 g 200 mL/hr over 30 Minutes Intravenous  Once 12/10/21 1550 12/10/21 1551   12/10/21 1600  metroNIDAZOLE (FLAGYL) IVPB 500 mg        500 mg 100 mL/hr over 60 Minutes Intravenous  Once 12/10/21 1550 12/10/21 1748   12/10/21 1600  vancomycin (VANCOCIN) IVPB 1000 mg/200 mL premix        1,000 mg 200 mL/hr over 60 Minutes Intravenous  Once 12/10/21 1550 12/10/21 1844   12/10/21  1600  cefTRIAXone (ROCEPHIN) 2 g in sodium chloride 0.9 % 100 mL IVPB        2 g 200 mL/hr over 30 Minutes Intravenous  Once 12/10/21 1551 12/10/21 1722        Time Spent in minutes  30   Lala Lund M.D on 12/11/2021 at 9:06 AM  To page go to [email or web address]   [facility] -  Office  [phone]  See all Orders from today for further details    Objective:   Vitals:   12/11/21 0008 12/11/21 0107 12/11/21 0500 12/11/21 0800  BP:  128/77 116/72 130/75  Pulse:  91 90 96  Resp:  '18 20 20  '$ Temp: 98.3 F (36.8 C)  98.4 F (36.9 C) 98.8 F (37.1 C)  TempSrc: Oral  Oral Oral  SpO2:  96% 99% 94%  Weight:      Height:        Wt Readings from Last 3 Encounters:  12/10/21 61.2 kg  06/09/20 67.1 kg  05/26/20 68.9 kg     Intake/Output Summary (Last 24 hours) at 12/11/2021 0906 Last data filed at 12/10/2021 1916 Gross per 24 hour  Intake 2200 ml  Output --  Net 2200 ml     Physical Exam  Somnolent, chronic left-sided hemiparesis, unable to follow commands reliably [place].AT,PERRAL Supple Neck, No JVD,   Symmetrical Chest wall movement, Good air movement bilaterally, CTAB RRR,No Gallops,Rubs or new Murmurs,  +ve B.Sounds, Abd Soft, No tenderness,   No Cyanosis, Clubbing or edema        Data Review:    CBC Recent Labs  Lab 12/10/21 1500 12/10/21 1557 12/11/21 0120  WBC 15.5*  --  14.4*  HGB 16.1* 16.0*   15.6* 13.6  HCT 47.9* 47.0*   46.0 39.7  PLT 221  --  164  MCV 95.2  --  94.3  MCH 32.0  --  32.3  MCHC 33.6  --  34.3  RDW 12.7  --  12.9  LYMPHSABS 0.1*  --  0.2*  MONOABS 0.3  --  0.6  EOSABS 0.1  --  0.0  BASOSABS 0.1  --  0.0    Electrolytes Recent Labs  Lab 12/10/21 1500 12/10/21 1549 12/10/21 1557 12/10/21 1748 12/10/21 2354 12/11/21 0120 12/11/21 0728  NA 138  --  139   139  --   --  135  --   K 3.8  --  3.8   3.8  --   --  3.5  --   CL 103  --  105  --   --  103  --   CO2 20*  --   --   --   --  20*  --   GLUCOSE 120*  --   115*  --   --  120*  --   BUN  14  --  16  --   --  14  --   CREATININE 0.84  --  0.50  --   --  0.61  --   CALCIUM 9.3  --   --   --   --  8.3*  --   AST 117*  --   --   --   --  79*  --   ALT 82*  --   --   --   --  63*  --   ALKPHOS 94  --   --   --   --  64  --   BILITOT 2.3*  --   --   --   --  2.7*  --   ALBUMIN 3.5  --   --   --   --  2.7*  --   MG  --   --   --   --   --   --  1.9  CRP  --   --   --   --   --   --  21.0*  DDIMER  --   --   --  7.31*  --   --   --   LATICACIDVEN  --  4.1*  --  5.4* 3.6* 2.1* 3.3*  INR  --   --   --  1.2  --   --   --   BNP 506.7*  --   --   --   --   --  263.6*    ------------------------------------------------------------------------------------------------------------------ No results for input(s): CHOL, HDL, LDLCALC, TRIG, CHOLHDL, LDLDIRECT in the last 72 hours.  Lab Results  Component Value Date   HGBA1C 5.3 12/26/2019    No results for input(s): TSH, T4TOTAL, T3FREE, THYROIDAB in the last 72 hours.  Invalid input(s): FREET3 ------------------------------------------------------------------------------------------------------------------ ID Labs Recent Labs  Lab 12/10/21 1500 12/10/21 1549 12/10/21 1557 12/10/21 1748 12/10/21 2354 12/11/21 0120 12/11/21 0728  WBC 15.5*  --   --   --   --  14.4*  --   PLT 221  --   --   --   --  164  --   CRP  --   --   --   --   --   --  21.0*  DDIMER  --   --   --  7.31*  --   --   --   LATICACIDVEN  --  4.1*  --  5.4* 3.6* 2.1* 3.3*  CREATININE 0.84  --  0.50  --   --  0.61  --    Cardiac Enzymes No results for input(s): CKMB, TROPONINI, MYOGLOBIN in the last 168 hours.  Invalid input(s): CK     Radiology Reports CT HEAD WO CONTRAST  Result Date: 12/10/2021 CLINICAL DATA:  Patient more lethargic. Change in behavior/mental status. Recently treated for a UTI. EXAM: CT HEAD WITHOUT CONTRAST TECHNIQUE: Contiguous axial images were obtained from the base of the skull through the vertex  without intravenous contrast. RADIATION DOSE REDUCTION: This exam was performed according to the departmental dose-optimization program which includes automated exposure control, adjustment of the mA and/or kV according to patient size and/or use of iterative reconstruction technique. COMPARISON:  03/15/2020 FINDINGS: Brain: No evidence of acute infarction, hemorrhage, hydrocephalus, extra-axial collection or mass lesion/mass effect. Old right occipital/parietal infarct, unchanged. Bilateral periventricular white matter hypoattenuation consistent with advanced chronic microvascular ischemic change. Vascular: No hyperdense vessel or unexpected calcification. Skull: Normal. Negative for fracture or focal  lesion. Sinuses/Orbits: Globes and orbits are unremarkable. Visualized sinuses are clear. Other: None. IMPRESSION: 1. No acute intracranial abnormalities. 2. Old right occipital/parietal lobe infarct and advanced chronic microvascular ischemic change, stable compared to the previous head CT. Electronically Signed   By: Lajean Manes M.D.   On: 12/10/2021 16:07   CT Angio Chest PE W and/or Wo Contrast  Result Date: 12/10/2021 CLINICAL DATA:  Lethargy and recent UTI. EXAM: CT ANGIOGRAPHY CHEST WITH CONTRAST TECHNIQUE: Multidetector CT imaging of the chest was performed using the standard protocol during bolus administration of intravenous contrast. Multiplanar CT image reconstructions and MIPs were obtained to evaluate the vascular anatomy. RADIATION DOSE REDUCTION: This exam was performed according to the departmental dose-optimization program which includes automated exposure control, adjustment of the mA and/or kV according to patient size and/or use of iterative reconstruction technique. CONTRAST:  1m OMNIPAQUE IOHEXOL 350 MG/ML SOLN COMPARISON:  March 21, 2020 and July 01, 2019 FINDINGS: Cardiovascular: There is marked severity calcification of the aortic arch and descending thoracic aorta, without  evidence of aortic aneurysm or dissection. Satisfactory opacification of the pulmonary arteries to the segmental level. No evidence of pulmonary embolism. There is mild cardiomegaly with moderate severity coronary artery calcification. A trace amount of pericardial fluid is seen. Mediastinum/Nodes: No enlarged mediastinal, hilar, or axillary lymph nodes. Thyroid gland, trachea, and esophagus demonstrate no significant findings. Lungs/Pleura: Mild biapical scarring and/or atelectasis is seen. This extends along the posterior aspect of the right upper lobe and right lower lobe. Mild atelectasis is also seen within the posterior aspect of the left lung base. A trace amount of pleural fluid is noted, bilaterally. No pneumothorax is identified. Upper Abdomen: A stable 17 mm x 11 mm focus of parenchymal low attenuation (approximately 26.33 Hounsfield units) is seen within the posterior aspect of the left lobe of the liver. Musculoskeletal: Bilateral partially calcified breast implants are seen. Multilevel degenerative changes are noted throughout the thoracic spine. Review of the MIP images confirms the above findings. IMPRESSION: 1. No evidence of pulmonary embolism. 2. Mild biapical, posterior right upper lobe, posterior right lower lobe and left basilar scarring and/or atelectasis. 3. Trace bilateral pleural effusions. 4. Trace amount of pleural fluid, bilaterally. 5. Mild cardiomegaly with moderate severity coronary artery calcification. 6. Stable hepatic cyst. 7. Aortic atherosclerosis. Aortic Atherosclerosis (ICD10-I70.0). Electronically Signed   By: TVirgina NorfolkM.D.   On: 12/10/2021 23:05   MR ANGIO HEAD WO CONTRAST  Result Date: 12/10/2021 CLINICAL DATA:  Altered mental status EXAM: MRI HEAD WITHOUT CONTRAST MRA HEAD WITHOUT CONTRAST TECHNIQUE: Multiplanar, multi-echo pulse sequences of the brain and surrounding structures were acquired without intravenous contrast. Angiographic images of the [place] were acquired using MRA technique without intravenous contrast. COMPARISON:  MRI MRA 12/25/2019, correlation is also made with CT head 12/10/2021 and CTA head 12/25/2019 FINDINGS: MRI HEAD FINDINGS Brain: No restricted diffusion to suggest acute or subacute infarct. Encephalomalacia in the right medial occipital lobe, medial parietal lobe, inferomedial temporal lobe, and hippocampus, consistent with evolution of the PCA territory infarcts noted on 12/25/2019. Some of these areas are associated with hemosiderin deposition, consistent with petechial hemorrhage. No acute hemorrhage, mass, mass effect, or midline shift. No hydrocephalus or extra-axial collection. Confluent T2 hyperintense signal in the periventricular white matter, likely the sequela of severe chronic small vessel ischemic disease. Lacunar infarcts in the left pons, right thalamus, and bilateral basal ganglia. Vascular: Please see MRA below. Skull and upper cervical spine: Normal marrow signal. Degenerative changes  at C3-C4, with mild anterolisthesis, and at C4-C5, likely causing moderate spinal canal stenosis. Sinuses/Orbits: No acute or significant finding. Status post bilateral lens replacements. Other: None. MRA HEAD FINDINGS Anterior circulation: Both internal carotid arteries are patent to the termini, without significant stenosis. Aplastic or stenosed right A1. Normal left A1. Normal anterior communicating artery. Anterior cerebral arteries are patent to their distal aspects. No M1 stenosis or occlusion. Normal MCA bifurcations. Distal MCA branches perfused and symmetric. Posterior circulation: Imaged portions of the vertebral arteries patent to the vertebrobasilar junction without stenosis. Basilar patent to its distal aspect. Superior cerebellar arteries patent bilaterally. Patent P1 segments. Severe focal stenosis in the proximal left P2 segment (series 5, image 55), which is likely similar to the prior exam, although that exam was  limited by motion. Left PCA branches are otherwise perfused to their distal aspects without stenosis. The right P2 segments are patent, although there is poor signal in the proximal right P3 segments, with distal right P3s not visualized. The bilateral posterior communicating arteries are not visualized. Anatomic variants: None significant IMPRESSION: 1. No acute intracranial process. 2. Severe focal stenosis in the proximal left P2 segment, which is likely similar to the prior exam, although that exam was limited by motion. 3. Poor signal in the proximal right P3 segments, without visualization of the distal right P3s, which are likely significantly stenosed or occluded. 4. Encephalomalacia in the right parietal, occipital, and inferior temporal lobes, consistent with expected evolution of the previously noted right PCA territory infarct. Electronically Signed   By: Merilyn Baba M.D.   On: 12/10/2021 21:50   MR BRAIN WO CONTRAST  Result Date: 12/10/2021 CLINICAL DATA:  Altered mental status EXAM: MRI HEAD WITHOUT CONTRAST MRA HEAD WITHOUT CONTRAST TECHNIQUE: Multiplanar, multi-echo pulse sequences of the brain and surrounding structures were acquired without intravenous contrast. Angiographic images of the Circle of [place] were acquired using MRA technique without intravenous contrast. COMPARISON:  MRI MRA 12/25/2019, correlation is also made with CT head 12/10/2021 and CTA head 12/25/2019 FINDINGS: MRI HEAD FINDINGS Brain: No restricted diffusion to suggest acute or subacute infarct. Encephalomalacia in the right medial occipital lobe, medial parietal lobe, inferomedial temporal lobe, and hippocampus, consistent with evolution of the PCA territory infarcts noted on 12/25/2019. Some of these areas are associated with hemosiderin deposition, consistent with petechial hemorrhage. No acute hemorrhage, mass, mass effect, or midline shift. No hydrocephalus or extra-axial collection. Confluent T2 hyperintense signal  in the periventricular white matter, likely the sequela of severe chronic small vessel ischemic disease. Lacunar infarcts in the left pons, right thalamus, and bilateral basal ganglia. Vascular: Please see MRA below. Skull and upper cervical spine: Normal marrow signal. Degenerative changes at C3-C4, with mild anterolisthesis, and at C4-C5, likely causing moderate spinal canal stenosis. Sinuses/Orbits: No acute or significant finding. Status post bilateral lens replacements. Other: None. MRA HEAD FINDINGS Anterior circulation: Both internal carotid arteries are patent to the termini, without significant stenosis. Aplastic or stenosed right A1. Normal left A1. Normal anterior communicating artery. Anterior cerebral arteries are patent to their distal aspects. No M1 stenosis or occlusion. Normal MCA bifurcations. Distal MCA branches perfused and symmetric. Posterior circulation: Imaged portions of the vertebral arteries patent to the vertebrobasilar junction without stenosis. Basilar patent to its distal aspect. Superior cerebellar arteries patent bilaterally. Patent P1 segments. Severe focal stenosis in the proximal left P2 segment (series 5, image 55), which is likely similar to the prior exam, although that exam was limited by motion. Left PCA  branches are otherwise perfused to their distal aspects without stenosis. The right P2 segments are patent, although there is poor signal in the proximal right P3 segments, with distal right P3s not visualized. The bilateral posterior communicating arteries are not visualized. Anatomic variants: None significant IMPRESSION: 1. No acute intracranial process. 2. Severe focal stenosis in the proximal left P2 segment, which is likely similar to the prior exam, although that exam was limited by motion. 3. Poor signal in the proximal right P3 segments, without visualization of the distal right P3s, which are likely significantly stenosed or occluded. 4. Encephalomalacia in the  right parietal, occipital, and inferior temporal lobes, consistent with expected evolution of the previously noted right PCA territory infarct. Electronically Signed   By: Merilyn Baba M.D.   On: 12/10/2021 21:50   CT ABDOMEN PELVIS W CONTRAST  Result Date: 12/10/2021 CLINICAL DATA:  Nausea and vomiting.  Complicated UTI.  Sepsis. EXAM: CT ABDOMEN AND PELVIS WITH CONTRAST TECHNIQUE: Multidetector CT imaging of the abdomen and pelvis was performed using the standard protocol following bolus administration of intravenous contrast. RADIATION DOSE REDUCTION: This exam was performed according to the departmental dose-optimization program which includes automated exposure control, adjustment of the mA and/or kV according to patient size and/or use of iterative reconstruction technique. CONTRAST:  55m OMNIPAQUE IOHEXOL 300 MG/ML  SOLN COMPARISON:  Remote CT 07/09/2012 FINDINGS: Lower chest: Trace bilateral pleural thickening and adjacent basilar atelectasis. The heart is enlarged. There are bilateral breast implants. Subcutaneous stranding is noted medial to the left breast implant in the left anterior chest wall, series 3, image 11. No drainable collection or soft tissue gas. Hepatobiliary: 11 mm hypodense lesion in the left hepatic lobe, slightly smaller than on remote prior exam. Focal fatty infiltration adjacent to the falciform ligament. Motion artifact through the gallbladder area and mid lower liver limits assessment. No obvious calcified gallstone. There is no biliary dilatation. Pancreas: Parenchymal atrophy. No ductal dilatation or inflammation. Spleen: Normal in size without focal abnormality. Adrenals/Urinary Tract: Normal adrenal glands. 10 mm nonobstructing stone in the upper right kidney. Slight prominence of the right renal pelvis but no hydronephrosis. There may be slight right ureteral thickening and enhancement. There is faint right perinephric edema, partially motion obscured. No evidence of focal  renal lesion. No renal fluid collection. 9 mm stone in the left renal pelvis but no hydronephrosis or caliceal dilatation. There is also a 6 mm intrarenal calculi in the left upper pole. There may be slight thickening and enhancement of the left renal pelvis and ureter. No renal fluid collection. The urinary bladder is nondistended, no bladder stone or wall thickening. Stomach/Bowel: Detailed bowel assessment is limited in the absence of enteric contrast and motion artifact. The stomach is decompressed. There is no small bowel obstruction or evidence of inflammation. The appendix is not confidently visualized on the current exam. Decompressed ascending colon. Small volume of stool throughout the remainder of the colon. No colonic inflammation. There is stool distending the rectum. Vascular/Lymphatic: Moderate to advanced aortic atherosclerosis. No aortic aneurysm. Patent portal vein. No abdominopelvic adenopathy. Reproductive: Status post hysterectomy. No adnexal masses. Other: No free air or ascites. Small fat containing umbilical hernia. Musculoskeletal: Scoliosis and diffuse degenerative change in the lumbar spine. Slight superior endplate compression deformity of L2 and L3. The bones are under mineralized. Surgical hardware in the right proximal femur. No evidence of focal bone lesion or acute osseous finding. IMPRESSION: 1. Bilateral nonobstructing renal calculi. There may be slight thickening and enhancement  of the renal collecting systems and ureters, suggesting urinary tract infection. No renal fluid collection. 2. Subcutaneous stranding medial to the left breast implant in the left anterior chest wall, may be infectious or inflammatory. No drainable collection or soft tissue gas. Recommend correlation with physical exam. Aortic Atherosclerosis (ICD10-I70.0). Electronically Signed   By: Keith Rake M.D.   On: 12/10/2021 18:28   DG Chest Port 1 View  Result Date: 12/10/2021 CLINICAL DATA:   Questionable sepsis.  Altered mental status. EXAM: PORTABLE CHEST 1 VIEW COMPARISON:  04/13/2020 FINDINGS: Borderline cardiomegaly.The cardiomediastinal contours are unchanged with atherosclerosis. The lungs are clear. Pulmonary vasculature is normal. No consolidation, pleural effusion, or pneumothorax. No acute osseous abnormalities are seen. Calcified breast implants. IMPRESSION: Low lung volumes with borderline cardiomegaly. No acute chest findings. Electronically Signed   By: Keith Rake M.D.   On: 12/10/2021 17:12

## 2021-12-11 NOTE — Progress Notes (Signed)
Lower extremity venous attempted. Patient in chair, requires RN care and transfer to bed first. Will attempt again as schedule and patient availability permits.  ? ?Darlin Coco, RDMS, RVT ? ?

## 2021-12-11 NOTE — Progress Notes (Signed)
EEG complete - results pending 

## 2021-12-11 NOTE — Procedures (Signed)
Patient Name: Claire West  ?MRN: [id number]  ?Epilepsy Attending: Lora Havens  ?Referring Physician/Provider: Amie Portland, MD ?Date: 12/11/2021 ?Duration: 21.55 mins ? ?Patient history: 81 year old female with prior stroke presented with altered mental status.  EEG evaluate for seizure. ? ?Level of alertness: Awake ? ?AEDs during EEG study: None ? ?Technical aspects: This EEG study was done with scalp electrodes positioned according to the 10-20 International system of electrode placement. Electrical activity was acquired at a sampling rate of '500Hz'$  and reviewed with a high frequency filter of '70Hz'$  and a low frequency filter of '1Hz'$ . EEG data were recorded continuously and digitally stored.  ? ?Description: The posterior dominant rhythm consists of 7.5 Hz activity of moderate voltage (25-35 uV) seen predominantly in posterior head regions, symmetric and reactive to eye opening and eye closing.  EEG showed continuous generalized 3 to 7 Hz theta-delta slowing, at times appears sharply contoured with triphasic morphology.  Hyperventilation and photic stimulation were not performed.    ? ?ABNORMALITY ?- Continuous slow, generalized ? ?IMPRESSION: ?This study is suggestive of moderate diffuse encephalopathy, nonspecific etiology. No seizures or definite epileptiform discharges were seen throughout the recording. ? ?Lora Havens  ? ?

## 2021-12-12 DIAGNOSIS — R7401 Elevation of levels of liver transaminase levels: Secondary | ICD-10-CM

## 2021-12-12 LAB — COMPREHENSIVE METABOLIC PANEL
ALT: 81 U/L — ABNORMAL HIGH (ref 0–44)
AST: 112 U/L — ABNORMAL HIGH (ref 15–41)
Albumin: 2.5 g/dL — ABNORMAL LOW (ref 3.5–5.0)
Alkaline Phosphatase: 87 U/L (ref 38–126)
Anion gap: 10 (ref 5–15)
BUN: 18 mg/dL (ref 8–23)
CO2: 21 mmol/L — ABNORMAL LOW (ref 22–32)
Calcium: 8.1 mg/dL — ABNORMAL LOW (ref 8.9–10.3)
Chloride: 100 mmol/L (ref 98–111)
Creatinine, Ser: 0.73 mg/dL (ref 0.44–1.00)
GFR, Estimated: >60 mL/min (ref >60)
Glucose, Bld: 102 mg/dL — ABNORMAL HIGH (ref 70–99)
Potassium: 3.3 mmol/L — ABNORMAL LOW (ref 3.5–5.1)
Sodium: 131 mmol/L — ABNORMAL LOW (ref 135–145)
Total Bilirubin: 2.1 mg/dL — ABNORMAL HIGH (ref 0.3–1.2)
Total Protein: 5.3 g/dL — ABNORMAL LOW (ref 6.5–8.1)

## 2021-12-12 LAB — PROCALCITONIN: Procalcitonin: 16.17 ng/mL

## 2021-12-12 LAB — CBC WITH DIFFERENTIAL/PLATELET
Abs Immature Granulocytes: 0.03 10*3/uL (ref 0.00–0.07)
Basophils Absolute: 0 10*3/uL (ref 0.0–0.1)
Basophils Relative: 0 %
Eosinophils Absolute: 0.3 10*3/uL (ref 0.0–0.5)
Eosinophils Relative: 4 %
HCT: 34 % — ABNORMAL LOW (ref 36.0–46.0)
Hemoglobin: 11.9 g/dL — ABNORMAL LOW (ref 12.0–15.0)
Immature Granulocytes: 0 %
Lymphocytes Relative: 15 %
Lymphs Abs: 1.1 10*3/uL (ref 0.7–4.0)
MCH: 32.7 pg (ref 26.0–34.0)
MCHC: 35 g/dL (ref 30.0–36.0)
MCV: 93.4 fL (ref 80.0–100.0)
Monocytes Absolute: 0.4 10*3/uL (ref 0.1–1.0)
Monocytes Relative: 5 %
Neutro Abs: 5.8 10*3/uL (ref 1.7–7.7)
Neutrophils Relative %: 76 %
Platelets: 153 10*3/uL (ref 150–400)
RBC: 3.64 MIL/uL — ABNORMAL LOW (ref 3.87–5.11)
RDW: 13.1 % (ref 11.5–15.5)
WBC: 7.7 10*3/uL (ref 4.0–10.5)
nRBC: 0 % (ref 0.0–0.2)

## 2021-12-12 LAB — HSV 1/2 PCR, CSF
HSV-1 DNA: NEGATIVE
HSV-2 DNA: NEGATIVE

## 2021-12-12 LAB — C-REACTIVE PROTEIN: CRP: 19.2 mg/dL — ABNORMAL HIGH (ref <1.0)

## 2021-12-12 LAB — MAGNESIUM: Magnesium: 1.9 mg/dL (ref 1.7–2.4)

## 2021-12-12 LAB — TSH: TSH: 4.025 u[IU]/mL (ref 0.350–4.500)

## 2021-12-12 LAB — GLUCOSE, CAPILLARY
Glucose-Capillary: 119 mg/dL — ABNORMAL HIGH (ref 70–99)
Glucose-Capillary: 120 mg/dL — ABNORMAL HIGH (ref 70–99)
Glucose-Capillary: 93 mg/dL (ref 70–99)

## 2021-12-12 LAB — BRAIN NATRIURETIC PEPTIDE: B Natriuretic Peptide: 50.2 pg/mL (ref 0.0–100.0)

## 2021-12-12 MED ORDER — POTASSIUM CHLORIDE CRYS ER 20 MEQ PO TBCR
40.0000 meq | EXTENDED_RELEASE_TABLET | Freq: Once | ORAL | Status: AC
  Administered 2021-12-12: 40 meq via ORAL
  Filled 2021-12-12: qty 2

## 2021-12-12 MED ORDER — SODIUM CHLORIDE 0.9 % IV SOLN
2.0000 g | INTRAVENOUS | Status: DC
  Administered 2021-12-13 – 2021-12-14 (×2): 2 g via INTRAVENOUS
  Filled 2021-12-12 (×3): qty 20

## 2021-12-12 NOTE — Progress Notes (Signed)
Neurology Progress Note ? ? ?S:// ?Seen and examined.  Speaking in full sentences.  Appears confused but can be directable. ? ? ?O:// ?Current vital signs: ?BP 130/64 (BP Location: Left Arm)   Pulse 86   Temp 97.8 ?F (36.6 ?C) (Oral)   Resp 16   Ht 5' 5.98" (1.676 m)   Wt 61.2 kg Comment: estimate based on previous weights  SpO2 96%   BMI 21.80 kg/m?  ?Vital signs in last 24 hours: ?Temp:  [97.8 ?F (36.6 ?C)-99.3 ?F (37.4 ?C)] 97.8 ?F (36.6 ?C) (03/07 0757) ?Pulse Rate:  [86-105] 86 (03/07 0757) ?Resp:  [16-20] 16 (03/07 0757) ?BP: (115-133)/(55-67) 130/64 (03/07 0757) ?SpO2:  [93 %-97 %] 96 % (03/07 0757) ?General: Awake alert in no distress ?HEENT: Normocephalic atraumatic ?CVS: Regular rhythm ?Respiratory: Breathing well saturating normally on room air ?Abdomen nondistended nontender ?Neurological exam ?Awake alert oriented to self ?Does not know the date time or correct age ?Does not know why she is in the hospital or she is in the hospital ?Is able to name simple objects. ?Asked me to give her my cell phone so that she can call her husband ?Is able to repeat sentences on multiple tries ?Poor attention concentration ?Cranial nerves: Pupils equal round reactive to light, extraocular movements intact, visual fields difficult to ascertain given her poor attention concentration, face appears symmetric. ?Motor examination: Moves all extremities pretty much symmetrically with mild increased tone in the left upper extremity. ?Sensation: Intact to touch all over ?Difficult to assess for coordination ? ?Medications ? ?Current Facility-Administered Medications:  ?  acetaminophen (TYLENOL) tablet 650 mg, 650 mg, Oral, Q6H PRN **OR** acetaminophen (TYLENOL) suppository 650 mg, 650 mg, Rectal, Q6H PRN, Rise Patience, MD ?  aspirin EC tablet 81 mg, 81 mg, Oral, q morning, Thurnell Lose, MD, 81 mg at 12/11/21 1344 ?  cefTRIAXone (ROCEPHIN) 2 g in sodium chloride 0.9 % 100 mL IVPB, 2 g, Intravenous, Q12H,  Rise Patience, MD, Last Rate: 200 mL/hr at 12/12/21 0523, 2 g at 12/12/21 0523 ?  heparin injection 5,000 Units, 5,000 Units, Subcutaneous, Q8H, Thurnell Lose, MD, 5,000 Units at 12/12/21 1829 ?  hydrALAZINE (APRESOLINE) injection 10 mg, 10 mg, Intravenous, Q6H PRN, Thurnell Lose, MD ?  labetalol (NORMODYNE) injection 10 mg, 10 mg, Intravenous, Q2H PRN, Rise Patience, MD ?  metoprolol succinate (TOPROL-XL) 24 hr tablet 50 mg, 50 mg, Oral, BID, Thurnell Lose, MD, 50 mg at 12/11/21 2308 ?  pantoprazole (PROTONIX) injection 40 mg, 40 mg, Intravenous, Q24H, Thurnell Lose, MD, 40 mg at 12/11/21 1344 ?  potassium chloride SA (KLOR-CON M) CR tablet 40 mEq, 40 mEq, Oral, Once, [place], Silver Huguenin, MD ?  pravastatin (PRAVACHOL) tablet 40 mg, 40 mg, Oral, QHS, Thurnell Lose, MD, 40 mg at 12/11/21 2308 ?  vancomycin (VANCOREADY) IVPB 500 mg/100 mL, 500 mg, Intravenous, Q24H, [place]namae Jude, [facility], Stopped at 12/12/21 9371 ?Labs ?CBC ?   ?Component Value Date/Time  ? WBC 7.7 12/12/2021 0117  ? RBC 3.64 (L) 12/12/2021 0117  ? HGB 11.9 (L) 12/12/2021 0117  ? HCT 34.0 (L) 12/12/2021 0117  ? HCT 34.6 11/06/2017 1738  ? PLT 153 12/12/2021 0117  ? MCV 93.4 12/12/2021 0117  ? MCH 32.7 12/12/2021 0117  ? MCHC 35.0 12/12/2021 0117  ? RDW 13.1 12/12/2021 0117  ? LYMPHSABS 1.1 12/12/2021 0117  ? MONOABS 0.4 12/12/2021 0117  ? EOSABS 0.3 12/12/2021 0117  ? BASOSABS 0.0 12/12/2021 0117  ? ? ?  CMP  ?   ?Component Value Date/Time  ? NA 131 (L) 12/12/2021 0117  ? K 3.3 (L) 12/12/2021 0117  ? CL 100 12/12/2021 0117  ? CO2 21 (L) 12/12/2021 0117  ? GLUCOSE 102 (H) 12/12/2021 0117  ? BUN 18 12/12/2021 0117  ? CREATININE 0.73 12/12/2021 0117  ? CALCIUM 8.1 (L) 12/12/2021 0117  ? PROT 5.3 (L) 12/12/2021 0117  ? ALBUMIN 2.5 (L) 12/12/2021 0117  ? AST 112 (H) 12/12/2021 0117  ? ALT 81 (H) 12/12/2021 0117  ? ALKPHOS 87 12/12/2021 0117  ? BILITOT 2.1 (H) 12/12/2021 0117  ? GFRNONAA >60 12/12/2021 0117  ? GFRAA >60  03/16/2020 0645  ? ? ?glycosylated hemoglobin ? ?Lipid Panel  ?   ?Component Value Date/Time  ? CHOL 172 12/26/2019 0733  ? TRIG 95 12/26/2019 0733  ? HDL 48 12/26/2019 0733  ? CHOLHDL 3.6 12/26/2019 0733  ? VLDL 19 12/26/2019 0733  ? Elmer 105 (H) 12/26/2019 2130  ? ? ? ?Imaging ?I have reviewed images in epic and the results pertinent to this consultation are: ?MRI brain without contrast with no acute intracranial process. ?MRA head with severe focal stenosis in the proximal left P2 segment which is likely similar to prior exam.  Poor signal in the proximal right P3 segment without visualization of the distal right P3 which are likely significantly stenosed or occluded.  Encephalomalacia in the right parietal occipital and inferior temporal lobes consistent with expected evolution of the previously noted right PCA territory infarct. ? ?Assessment:  ?81 year old woman past history of vascular/Lewy body dementia recently diagnosed with a UTI presented with altered mental status-likely toxic metabolic encephalopathy secondary to urosepsis. ?Because of her spiking fevers up to 103 Fahrenheit, she underwent an LP which was unremarkable and HSV PCR has been negative. ?I do not suspect a CNS infection and suspect that her current presentation was likely secondary to toxic metabolic encephalopathy from UTI. ? ?Recommendations: ?Continue management of the UTI/infection per primary team ?No need for CNS infection treatment as I do not suspect that based on the CSF findings ?Discontinue acyclovir ?Aspirin 325 on antiplatelet-aspirin-no new stroke-continue aspirin 81 for now. ?Neurology will be available as needed ?Plan discussed with Dr. Waldron Labs. ? ? ?-- ?Amie Portland, MD ?Neurologist ?[facility] ?Pager: [phone] ?

## 2021-12-12 NOTE — Progress Notes (Addendum)
PROGRESS NOTE                                                                                                                                                                                                             Patient Demographics:    Claire West, is a 81 y.o. female, DOB - [birth year], UXL:[id number]  Outpatient Primary MD for the patient is Sharilyn Sites, MD    LOS - 2  Admit date - 12/10/2021    Chief Complaint  Patient presents with   Altered Mental Status       Brief Narrative (HPI from H&P)   - 81 y.o. female with history of Lewy body dementia and vascular dementia, hypertension, hyperlipidemia, previous stroke with left-sided weakness, wheelchair and bedbound at SNF, was recently treated for UTI with patient complaining of dysuria for the last 2 days.  This afternoon patient became more confused lethargic less responsive and was brought to the ER.  The ER she was admitted for sepsis, she was found to have UTI underwent LP to rule out meningitis.  She is was seen by neurology and hospitalist team was requested to admit the patient.   Subjective:    Claire West with no significant events overnight as discussed with staff, she is herself unable to provide any complaints.     Assessment  & Plan :   Acute metabolic encephalopathy - due to sepsis with most likely source being UTI -Mentation improving with treatment of sepsis. -Previous MD discussed CSF findings  with Dr. Lynnae Sandhoff does not represent a bacterial infection however we cannot rule out viral encephalitis completely.  We will continue acyclovir for till we have ruled out viral infection.   -EEG significant for diffuse encephalopathic, no seizure activities.  Sepsis due to UTI -Sepsis present on admission with evidence of endorgan damage including lactic acidosis, leukocytosis, and altered mental status -Source most likely related to UTI   -Continue Rocephin for UTI, and vancomycin for today, check MRSA nasal PCR as well and follow cultures.  Hx of CVA with left-sided hemiparesis.  Continue aspirin and statin for secondary prevention as tolerated, she is not taking Plavix anymore.  No acute issues on MRI of the brain.  PT, OT and speech to follow-up.  Note patient is largely bedbound and lives at an SNF.  Dyslipidemia.  -We  will hold her statin given LFTs trending up.  Transaminitis -LFTs trending up, most likely due to sepsis, hold statin and continue to monitor.  Elevated D-dimer.  -  CTA chest negative.   -Has Doppler significant for chronic DVT in the left femoral vein, no indication to anticoagulate given it it says is chronic.     Hypertension.  Beta-blocker as tolerated along with as needed IV hydralazine.  Early dementia.  Supportive care at risk for delirium.   Incidental CT finding - ?  Cellulitis around the left breast.  No fluid collection.  Continue vancomycin and above antibiotics and monitor.  Hyponatremia -We will limit fluid intake to 1500 cc/day.  Hypokalemia -We will replete, recheck in a.m.       Condition - Extremely Guarded  Family Communication  : None at bedside  Code Status :  Full  Consults  :  Neuro  PUD Prophylaxis : PPI   Procedures  :     CT Abd  - Pelvis - 1. Bilateral nonobstructing renal calculi. There may be slight thickening and enhancement of the renal collecting systems and ureters, suggesting urinary tract infection. No renal fluid collection. 2. Subcutaneous stranding medial to the left breast implant in the left anterior chest wall, may be infectious or inflammatory. No drainable collection or soft tissue gas. Recommend correlation with physical exam. Aortic Atherosclerosis (ICD10-I70.0)  MRI-A -  1. No acute intracranial process. 2. Severe focal stenosis in the proximal left P2 segment, which is likely similar to the prior exam, although that exam was limited by  motion. 3. Poor signal in the proximal right P3 segments, without visualization of the distal right P3s, which are likely significantly stenosed or occluded. 4. Encephalomalacia in the right parietal, occipital, and inferior temporal lobes, consistent with expected evolution of the previously noted right PCA territory infarct.      Disposition Plan  :    Status is: Inpatient    DVT Prophylaxis  :     Heparin - SCDs    Lab Results  Component Value Date   PLT 153 12/12/2021    Diet :  Diet Order             Diet Heart Room service appropriate? Yes with Assist; Fluid consistency: Thin  Diet effective now                    Inpatient Medications  Scheduled Meds:  aspirin EC  81 mg Oral q morning   heparin injection (subcutaneous)  5,000 Units Subcutaneous Q8H   metoprolol succinate  50 mg Oral BID   pantoprazole (PROTONIX) IV  40 mg Intravenous Q24H   potassium chloride  40 mEq Oral Once   pravastatin  40 mg Oral QHS   Continuous Infusions:  cefTRIAXone (ROCEPHIN)  IV 2 g (12/12/21 0523)   vancomycin Stopped (12/12/21 0714)   PRN Meds:.acetaminophen **OR** acetaminophen, hydrALAZINE, labetalol  Antibiotics  :    Anti-infectives (From admission, onward)    Start     Dose/Rate Route Frequency Ordered Stop   12/12/21 0600  vancomycin (VANCOREADY) IVPB 500 mg/100 mL        500 mg 100 mL/hr over 60 Minutes Intravenous Every 24 hours 12/11/21 1009     12/11/21 0800  acyclovir (ZOVIRAX) 600 mg in dextrose 5 % 100 mL IVPB  Status:  Discontinued        600 mg 112 mL/hr over 60 Minutes Intravenous Every 8 hours 12/11/21 0025 12/12/21 0849  12/11/21 0700  vancomycin (VANCOREADY) IVPB 500 mg/100 mL  Status:  Discontinued        500 mg 100 mL/hr over 60 Minutes Intravenous Every 12 hours 12/10/21 1717 12/11/21 0903   12/11/21 0600  cefTRIAXone (ROCEPHIN) 2 g in sodium chloride 0.9 % 100 mL IVPB        2 g 200 mL/hr over 30 Minutes Intravenous Every 12 hours 12/10/21  2316     12/11/21 0030  acyclovir (ZOVIRAX) 600 mg in dextrose 5 % 100 mL IVPB        600 mg 112 mL/hr over 60 Minutes Intravenous  Once 12/11/21 0025 12/11/21 0316   12/10/21 1600  aztreonam (AZACTAM) 2 g in sodium chloride 0.9 % 100 mL IVPB  Status:  Discontinued        2 g 200 mL/hr over 30 Minutes Intravenous  Once 12/10/21 1550 12/10/21 1551   12/10/21 1600  metroNIDAZOLE (FLAGYL) IVPB 500 mg        500 mg 100 mL/hr over 60 Minutes Intravenous  Once 12/10/21 1550 12/10/21 1748   12/10/21 1600  vancomycin (VANCOCIN) IVPB 1000 mg/200 mL premix        1,000 mg 200 mL/hr over 60 Minutes Intravenous  Once 12/10/21 1550 12/10/21 1844   12/10/21 1600  cefTRIAXone (ROCEPHIN) 2 g in sodium chloride 0.9 % 100 mL IVPB        2 g 200 mL/hr over 30 Minutes Intravenous  Once 12/10/21 1551 12/10/21 1722         Lysa Livengood M.D on 12/12/2021 at 10:07 AM  To page go to [email or web address]   [facility] -  Office  [phone]  See all Orders from today for further details    Objective:   Vitals:   12/11/21 2001 12/11/21 2354 12/12/21 0424 12/12/21 0757  BP: 133/62 (!) 119/59 120/66 130/64  Pulse: 94 100 91 86  Resp: '20 20 19 16  '$ Temp:  98.3 F (36.8 C) 98 F (36.7 C) 97.8 F (36.6 C)  TempSrc:  Oral Oral Oral  SpO2: 96% 97% 94% 96%  Weight:      Height:        Wt Readings from Last 3 Encounters:  12/10/21 61.2 kg  06/09/20 67.1 kg  05/26/20 68.9 kg     Intake/Output Summary (Last 24 hours) at 12/12/2021 1007 Last data filed at 12/12/2021 0758 Gross per 24 hour  Intake 1946.36 ml  Output 600 ml  Net 1346.36 ml     Physical Exam  Awake, oriented x1, confused but pleasant.   Chronic left-sided hemiparesis Symmetrical Chest wall movement, Good air movement bilaterally, CTAB RRR,No Gallops,Rubs or new Murmurs, No Parasternal Heave +ve B.Sounds, Abd Soft, No tenderness, No rebound - guarding or rigidity. No Cyanosis, Clubbing or edema, No new Rash or bruise          Data Review:    CBC Recent Labs  Lab 12/10/21 1500 12/10/21 1557 12/11/21 0120 12/12/21 0117  WBC 15.5*  --  14.4* 7.7  HGB 16.1* 16.0*   15.6* 13.6 11.9*  HCT 47.9* 47.0*   46.0 39.7 34.0*  PLT 221  --  164 153  MCV 95.2  --  94.3 93.4  MCH 32.0  --  32.3 32.7  MCHC 33.6  --  34.3 35.0  RDW 12.7  --  12.9 13.1  LYMPHSABS 0.1*  --  0.2* 1.1  MONOABS 0.3  --  0.6 0.4  EOSABS 0.1  --  0.0 0.3  BASOSABS 0.1  --  0.0 0.0    Electrolytes Recent Labs  Lab 12/10/21 1500 12/10/21 1549 12/10/21 1557 12/10/21 1748 12/10/21 2354 12/11/21 0120 12/11/21 0728 12/12/21 0117  NA 138  --  139   139  --   --  135  --  131*  K 3.8  --  3.8   3.8  --   --  3.5  --  3.3*  CL 103  --  105  --   --  103  --  100  CO2 20*  --   --   --   --  20*  --  21*  GLUCOSE 120*  --  115*  --   --  120*  --  102*  BUN 14  --  16  --   --  14  --  18  CREATININE 0.84  --  0.50  --   --  0.61  --  0.73  CALCIUM 9.3  --   --   --   --  8.3*  --  8.1*  AST 117*  --   --   --   --  79*  --  112*  ALT 82*  --   --   --   --  63*  --  81*  ALKPHOS 94  --   --   --   --  64  --  87  BILITOT 2.3*  --   --   --   --  2.7*  --  2.1*  ALBUMIN 3.5  --   --   --   --  2.7*  --  2.5*  MG  --   --   --   --   --   --  1.9 1.9  CRP  --   --   --   --   --   --  21.0* 19.2*  DDIMER  --   --   --  7.31*  --   --   --   --   PROCALCITON  --   --   --   --   --   --  21.57 16.17  LATICACIDVEN  --  4.1*  --  5.4* 3.6* 2.1* 3.3*  --   INR  --   --   --  1.2  --   --   --   --   TSH  --   --   --   --   --   --   --  4.025  BNP 506.7*  --   --   --   --   --  263.6* 50.2    ------------------------------------------------------------------------------------------------------------------ No results for input(s): CHOL, HDL, LDLCALC, TRIG, CHOLHDL, LDLDIRECT in the last 72 hours.  Lab Results  Component Value Date   HGBA1C 5.3 12/26/2019    Recent Labs    12/12/21 0117  TSH 4.025    ------------------------------------------------------------------------------------------------------------------ ID Labs Recent Labs  Lab 12/10/21 1500 12/10/21 1549 12/10/21 1557 12/10/21 1748 12/10/21 2354 12/11/21 0120 12/11/21 0728 12/12/21 0117  WBC 15.5*  --   --   --   --  14.4*  --  7.7  PLT 221  --   --   --   --  164  --  153  CRP  --   --   --   --   --   --  21.0* 19.2*  DDIMER  --   --   --  7.31*  --   --   --   --   PROCALCITON  --   --   --   --   --   --  21.57 16.17  LATICACIDVEN  --  4.1*  --  5.4* 3.6* 2.1* 3.3*  --   CREATININE 0.84  --  0.50  --   --  0.61  --  0.73   Cardiac Enzymes No results for input(s): CKMB, TROPONINI, MYOGLOBIN in the last 168 hours.  Invalid input(s): CK     Radiology Reports CT HEAD WO CONTRAST  Result Date: 12/10/2021 CLINICAL DATA:  Patient more lethargic. Change in behavior/mental status. Recently treated for a UTI. EXAM: CT HEAD WITHOUT CONTRAST TECHNIQUE: Contiguous axial images were obtained from the base of the skull through the vertex without intravenous contrast. RADIATION DOSE REDUCTION: This exam was performed according to the departmental dose-optimization program which includes automated exposure control, adjustment of the mA and/or kV according to patient size and/or use of iterative reconstruction technique. COMPARISON:  03/15/2020 FINDINGS: Brain: No evidence of acute infarction, hemorrhage, hydrocephalus, extra-axial collection or mass lesion/mass effect. Old right occipital/parietal infarct, unchanged. Bilateral periventricular white matter hypoattenuation consistent with advanced chronic microvascular ischemic change. Vascular: No hyperdense vessel or unexpected calcification. Skull: Normal. Negative for fracture or focal lesion. Sinuses/Orbits: Globes and orbits are unremarkable. Visualized sinuses are clear. Other: None. IMPRESSION: 1. No acute intracranial abnormalities. 2. Old right occipital/parietal lobe  infarct and advanced chronic microvascular ischemic change, stable compared to the previous head CT. Electronically Signed   By: Lajean Manes M.D.   On: 12/10/2021 16:07   CT Angio Chest PE W and/or Wo Contrast  Result Date: 12/10/2021 CLINICAL DATA:  Lethargy and recent UTI. EXAM: CT ANGIOGRAPHY CHEST WITH CONTRAST TECHNIQUE: Multidetector CT imaging of the chest was performed using the standard protocol during bolus administration of intravenous contrast. Multiplanar CT image reconstructions and MIPs were obtained to evaluate the vascular anatomy. RADIATION DOSE REDUCTION: This exam was performed according to the departmental dose-optimization program which includes automated exposure control, adjustment of the mA and/or kV according to patient size and/or use of iterative reconstruction technique. CONTRAST:  47m OMNIPAQUE IOHEXOL 350 MG/ML SOLN COMPARISON:  March 21, 2020 and July 01, 2019 FINDINGS: Cardiovascular: There is marked severity calcification of the aortic arch and descending thoracic aorta, without evidence of aortic aneurysm or dissection. Satisfactory opacification of the pulmonary arteries to the segmental level. No evidence of pulmonary embolism. There is mild cardiomegaly with moderate severity coronary artery calcification. A trace amount of pericardial fluid is seen. Mediastinum/Nodes: No enlarged mediastinal, hilar, or axillary lymph nodes. Thyroid gland, trachea, and esophagus demonstrate no significant findings. Lungs/Pleura: Mild biapical scarring and/or atelectasis is seen. This extends along the posterior aspect of the right upper lobe and right lower lobe. Mild atelectasis is also seen within the posterior aspect of the left lung base. A trace amount of pleural fluid is noted, bilaterally. No pneumothorax is identified. Upper Abdomen: A stable 17 mm x 11 mm focus of parenchymal low attenuation (approximately 26.33 Hounsfield units) is seen within the posterior aspect of the left  lobe of the liver. Musculoskeletal: Bilateral partially calcified breast implants are seen. Multilevel degenerative changes are noted throughout the thoracic spine. Review of the MIP images confirms the above findings. IMPRESSION: 1. No evidence of pulmonary embolism. 2. Mild biapical, posterior right upper lobe, posterior right lower lobe and left basilar scarring and/or atelectasis. 3. Trace bilateral pleural effusions. 4. Trace amount  of pleural fluid, bilaterally. 5. Mild cardiomegaly with moderate severity coronary artery calcification. 6. Stable hepatic cyst. 7. Aortic atherosclerosis. Aortic Atherosclerosis (ICD10-I70.0). Electronically Signed   By: Virgina Norfolk M.D.   On: 12/10/2021 23:05   MR ANGIO HEAD WO CONTRAST  Result Date: 12/10/2021 CLINICAL DATA:  Altered mental status EXAM: MRI HEAD WITHOUT CONTRAST MRA HEAD WITHOUT CONTRAST TECHNIQUE: Multiplanar, multi-echo pulse sequences of the brain and surrounding structures were acquired without intravenous contrast. Angiographic images of the Circle of Willis were acquired using MRA technique without intravenous contrast. COMPARISON:  MRI MRA 12/25/2019, correlation is also made with CT head 12/10/2021 and CTA head 12/25/2019 FINDINGS: MRI HEAD FINDINGS Brain: No restricted diffusion to suggest acute or subacute infarct. Encephalomalacia in the right medial occipital lobe, medial parietal lobe, inferomedial temporal lobe, and hippocampus, consistent with evolution of the PCA territory infarcts noted on 12/25/2019. Some of these areas are associated with hemosiderin deposition, consistent with petechial hemorrhage. No acute hemorrhage, mass, mass effect, or midline shift. No hydrocephalus or extra-axial collection. Confluent T2 hyperintense signal in the periventricular white matter, likely the sequela of severe chronic small vessel ischemic disease. Lacunar infarcts in the left pons, right thalamus, and bilateral basal ganglia. Vascular: Please see  MRA below. Skull and upper cervical spine: Normal marrow signal. Degenerative changes at C3-C4, with mild anterolisthesis, and at C4-C5, likely causing moderate spinal canal stenosis. Sinuses/Orbits: No acute or significant finding. Status post bilateral lens replacements. Other: None. MRA HEAD FINDINGS Anterior circulation: Both internal carotid arteries are patent to the termini, without significant stenosis. Aplastic or stenosed right A1. Normal left A1. Normal anterior communicating artery. Anterior cerebral arteries are patent to their distal aspects. No M1 stenosis or occlusion. Normal MCA bifurcations. Distal MCA branches perfused and symmetric. Posterior circulation: Imaged portions of the vertebral arteries patent to the vertebrobasilar junction without stenosis. Basilar patent to its distal aspect. Superior cerebellar arteries patent bilaterally. Patent P1 segments. Severe focal stenosis in the proximal left P2 segment (series 5, image 55), which is likely similar to the prior exam, although that exam was limited by motion. Left PCA branches are otherwise perfused to their distal aspects without stenosis. The right P2 segments are patent, although there is poor signal in the proximal right P3 segments, with distal right P3s not visualized. The bilateral posterior communicating arteries are not visualized. Anatomic variants: None significant IMPRESSION: 1. No acute intracranial process. 2. Severe focal stenosis in the proximal left P2 segment, which is likely similar to the prior exam, although that exam was limited by motion. 3. Poor signal in the proximal right P3 segments, without visualization of the distal right P3s, which are likely significantly stenosed or occluded. 4. Encephalomalacia in the right parietal, occipital, and inferior temporal lobes, consistent with expected evolution of the previously noted right PCA territory infarct. Electronically Signed   By: Merilyn Baba M.D.   On: 12/10/2021  21:50   MR BRAIN WO CONTRAST  Result Date: 12/10/2021 CLINICAL DATA:  Altered mental status EXAM: MRI HEAD WITHOUT CONTRAST MRA HEAD WITHOUT CONTRAST TECHNIQUE: Multiplanar, multi-echo pulse sequences of the brain and surrounding structures were acquired without intravenous contrast. Angiographic images of the Circle of Willis were acquired using MRA technique without intravenous contrast. COMPARISON:  MRI MRA 12/25/2019, correlation is also made with CT head 12/10/2021 and CTA head 12/25/2019 FINDINGS: MRI HEAD FINDINGS Brain: No restricted diffusion to suggest acute or subacute infarct. Encephalomalacia in the right medial occipital lobe, medial parietal lobe, inferomedial temporal lobe,  and hippocampus, consistent with evolution of the PCA territory infarcts noted on 12/25/2019. Some of these areas are associated with hemosiderin deposition, consistent with petechial hemorrhage. No acute hemorrhage, mass, mass effect, or midline shift. No hydrocephalus or extra-axial collection. Confluent T2 hyperintense signal in the periventricular white matter, likely the sequela of severe chronic small vessel ischemic disease. Lacunar infarcts in the left pons, right thalamus, and bilateral basal ganglia. Vascular: Please see MRA below. Skull and upper cervical spine: Normal marrow signal. Degenerative changes at C3-C4, with mild anterolisthesis, and at C4-C5, likely causing moderate spinal canal stenosis. Sinuses/Orbits: No acute or significant finding. Status post bilateral lens replacements. Other: None. MRA HEAD FINDINGS Anterior circulation: Both internal carotid arteries are patent to the termini, without significant stenosis. Aplastic or stenosed right A1. Normal left A1. Normal anterior communicating artery. Anterior cerebral arteries are patent to their distal aspects. No M1 stenosis or occlusion. Normal MCA bifurcations. Distal MCA branches perfused and symmetric. Posterior circulation: Imaged portions of the  vertebral arteries patent to the vertebrobasilar junction without stenosis. Basilar patent to its distal aspect. Superior cerebellar arteries patent bilaterally. Patent P1 segments. Severe focal stenosis in the proximal left P2 segment (series 5, image 55), which is likely similar to the prior exam, although that exam was limited by motion. Left PCA branches are otherwise perfused to their distal aspects without stenosis. The right P2 segments are patent, although there is poor signal in the proximal right P3 segments, with distal right P3s not visualized. The bilateral posterior communicating arteries are not visualized. Anatomic variants: None significant IMPRESSION: 1. No acute intracranial process. 2. Severe focal stenosis in the proximal left P2 segment, which is likely similar to the prior exam, although that exam was limited by motion. 3. Poor signal in the proximal right P3 segments, without visualization of the distal right P3s, which are likely significantly stenosed or occluded. 4. Encephalomalacia in the right parietal, occipital, and inferior temporal lobes, consistent with expected evolution of the previously noted right PCA territory infarct. Electronically Signed   By: Merilyn Baba M.D.   On: 12/10/2021 21:50   CT ABDOMEN PELVIS W CONTRAST  Result Date: 12/10/2021 CLINICAL DATA:  Nausea and vomiting.  Complicated UTI.  Sepsis. EXAM: CT ABDOMEN AND PELVIS WITH CONTRAST TECHNIQUE: Multidetector CT imaging of the abdomen and pelvis was performed using the standard protocol following bolus administration of intravenous contrast. RADIATION DOSE REDUCTION: This exam was performed according to the departmental dose-optimization program which includes automated exposure control, adjustment of the mA and/or kV according to patient size and/or use of iterative reconstruction technique. CONTRAST:  95m OMNIPAQUE IOHEXOL 300 MG/ML  SOLN COMPARISON:  Remote CT 07/09/2012 FINDINGS: Lower chest: Trace bilateral  pleural thickening and adjacent basilar atelectasis. The heart is enlarged. There are bilateral breast implants. Subcutaneous stranding is noted medial to the left breast implant in the left anterior chest wall, series 3, image 11. No drainable collection or soft tissue gas. Hepatobiliary: 11 mm hypodense lesion in the left hepatic lobe, slightly smaller than on remote prior exam. Focal fatty infiltration adjacent to the falciform ligament. Motion artifact through the gallbladder area and mid lower liver limits assessment. No obvious calcified gallstone. There is no biliary dilatation. Pancreas: Parenchymal atrophy. No ductal dilatation or inflammation. Spleen: Normal in size without focal abnormality. Adrenals/Urinary Tract: Normal adrenal glands. 10 mm nonobstructing stone in the upper right kidney. Slight prominence of the right renal pelvis but no hydronephrosis. There may be slight right ureteral thickening and  enhancement. There is faint right perinephric edema, partially motion obscured. No evidence of focal renal lesion. No renal fluid collection. 9 mm stone in the left renal pelvis but no hydronephrosis or caliceal dilatation. There is also a 6 mm intrarenal calculi in the left upper pole. There may be slight thickening and enhancement of the left renal pelvis and ureter. No renal fluid collection. The urinary bladder is nondistended, no bladder stone or wall thickening. Stomach/Bowel: Detailed bowel assessment is limited in the absence of enteric contrast and motion artifact. The stomach is decompressed. There is no small bowel obstruction or evidence of inflammation. The appendix is not confidently visualized on the current exam. Decompressed ascending colon. Small volume of stool throughout the remainder of the colon. No colonic inflammation. There is stool distending the rectum. Vascular/Lymphatic: Moderate to advanced aortic atherosclerosis. No aortic aneurysm. Patent portal vein. No abdominopelvic  adenopathy. Reproductive: Status post hysterectomy. No adnexal masses. Other: No free air or ascites. Small fat containing umbilical hernia. Musculoskeletal: Scoliosis and diffuse degenerative change in the lumbar spine. Slight superior endplate compression deformity of L2 and L3. The bones are under mineralized. Surgical hardware in the right proximal femur. No evidence of focal bone lesion or acute osseous finding. IMPRESSION: 1. Bilateral nonobstructing renal calculi. There may be slight thickening and enhancement of the renal collecting systems and ureters, suggesting urinary tract infection. No renal fluid collection. 2. Subcutaneous stranding medial to the left breast implant in the left anterior chest wall, may be infectious or inflammatory. No drainable collection or soft tissue gas. Recommend correlation with physical exam. Aortic Atherosclerosis (ICD10-I70.0). Electronically Signed   By: Keith Rake M.D.   On: 12/10/2021 18:28   DG Chest Port 1 View  Result Date: 12/10/2021 CLINICAL DATA:  Questionable sepsis.  Altered mental status. EXAM: PORTABLE CHEST 1 VIEW COMPARISON:  04/13/2020 FINDINGS: Borderline cardiomegaly.The cardiomediastinal contours are unchanged with atherosclerosis. The lungs are clear. Pulmonary vasculature is normal. No consolidation, pleural effusion, or pneumothorax. No acute osseous abnormalities are seen. Calcified breast implants. IMPRESSION: Low lung volumes with borderline cardiomegaly. No acute chest findings. Electronically Signed   By: Keith Rake M.D.   On: 12/10/2021 17:12   EEG adult  Result Date: 12/11/2021 Lora Havens, MD     12/11/2021  2:51 PM Patient Name: ALISIA VANENGEN MRN: [id number] Epilepsy Attending: Lora Havens Referring Physician/Provider: Amie Portland, MD Date: 12/11/2021 Duration: 21.55 mins Patient history: 81 year old female with prior stroke presented with altered mental status.  EEG evaluate for seizure. Level of alertness: Awake  AEDs during EEG study: None Technical aspects: This EEG study was done with scalp electrodes positioned according to the 10-20 International system of electrode placement. Electrical activity was acquired at a sampling rate of '500Hz'$  and reviewed with a high frequency filter of '70Hz'$  and a low frequency filter of '1Hz'$ . EEG data were recorded continuously and digitally stored. Description: The posterior dominant rhythm consists of 7.5 Hz activity of moderate voltage (25-35 uV) seen predominantly in posterior head regions, symmetric and reactive to eye opening and eye closing.  EEG showed continuous generalized 3 to 7 Hz theta-delta slowing, at times appears sharply contoured with triphasic morphology.  Hyperventilation and photic stimulation were not performed.   ABNORMALITY - Continuous slow, generalized IMPRESSION: This study is suggestive of moderate diffuse encephalopathy, nonspecific etiology. No seizures or definite epileptiform discharges were seen throughout the recording. Priyanka O Yadav   VAS [place] LOWER EXTREMITY VENOUS (DVT)  Result Date: 12/11/2021  Lower  Venous DVT Study Patient Name:  NAJMAH CARRADINE  Date of Exam:   12/11/2021 Medical Rec #: [id number]         Accession #:    [id number] Date of Birth: 1941-03-16         Patient Gender: F Patient Age:   72 years Exam Location:  [facility] Procedure:      VAS [place] LOWER EXTREMITY VENOUS (DVT) Referring Phys: [name] [facility] --------------------------------------------------------------------------------  Indications: D-dimer elevated.  Risk Factors: Immobility. Limitations: Poor ultrasound/tissue interface and shadowing due to overlying arterial calcification and patient limited mobility. Comparison Study: 12-26-2019 Prior bilateral lower extremity venous was negative                   for DVT. Performing Technologist: Darlin Coco RDMS, RVT  Examination Guidelines: A complete evaluation includes B-mode imaging, spectral Doppler, color Doppler, and  power Doppler as needed of all accessible portions of each vessel. Bilateral testing is considered an integral part of a complete examination. Limited examinations for reoccurring indications may be performed as noted. The reflux portion of the exam is performed with the patient in reverse Trendelenburg.  +---------+---------------+---------+-----------+----------+-------------------+  RIGHT     Compressibility Phasicity Spontaneity Properties Thrombus Aging       +---------+---------------+---------+-----------+----------+-------------------+  CFV       Full            Yes       Yes                                         +---------+---------------+---------+-----------+----------+-------------------+  SFJ       Full                                                                  +---------+---------------+---------+-----------+----------+-------------------+  FV Prox   Full                                                                  +---------+---------------+---------+-----------+----------+-------------------+  FV Mid    Full                                                                  +---------+---------------+---------+-----------+----------+-------------------+  FV Distal Full                                                                  +---------+---------------+---------+-----------+----------+-------------------+  PFV       Full                                                                  +---------+---------------+---------+-----------+----------+-------------------+  POP       Full            Yes       Yes                                         +---------+---------------+---------+-----------+----------+-------------------+  PTV       Full                                                                  +---------+---------------+---------+-----------+----------+-------------------+  PERO      Full                                             Some segments not                                                                 well visualized      +---------+---------------+---------+-----------+----------+-------------------+   +---------+---------------+---------+-----------+----------+-------------------+  LEFT      Compressibility Phasicity Spontaneity Properties Thrombus Aging       +---------+---------------+---------+-----------+----------+-------------------+  CFV       Full            Yes       Yes                                         +---------+---------------+---------+-----------+----------+-------------------+  SFJ       Full                                                                  +---------+---------------+---------+-----------+----------+-------------------+  FV Prox   Partial         Yes       Yes                    Chronic              +---------+---------------+---------+-----------+----------+-------------------+  FV Mid    Partial         Yes       Yes                    Chronic              +---------+---------------+---------+-----------+----------+-------------------+  FV Distal Partial         Yes       Yes                    Chronic              +---------+---------------+---------+-----------+----------+-------------------+  PFV       Full                                                                  +---------+---------------+---------+-----------+----------+-------------------+  POP       Full            Yes       Yes                                         +---------+---------------+---------+-----------+----------+-------------------+  PTV       Full                                                                  +---------+---------------+---------+-----------+----------+-------------------+  PERO      Full                                             Some segments not                                                                well visualized      +---------+---------------+---------+-----------+----------+-------------------+     Summary: RIGHT: -  There is no evidence of deep vein thrombosis in the lower extremity. However, portions of this examination were limited- see technologist comments above.  - No cystic structure found in the popliteal fossa.  LEFT: - Findings consistent with chronic deep vein thrombosis involving the left femoral vein. - No cystic structure found in the popliteal fossa.  *See table(s) above for measurements and observations. Electronically signed by Servando Snare MD on 12/11/2021 at 5:13:45 PM.    Final

## 2021-12-12 NOTE — Progress Notes (Signed)
OT Cancellation Note ? ?Patient Details ?Name: BLONNIE MASKE ?MRN: [id number] ?DOB: [birth year] ? ? ?Cancelled Treatment:    Reason Eval/Treat Not Completed: OT screened, no needs identified, will sign off OT completed evaluation/discharge yesterday, 3/6. Noted new orders 3/7 though no new OT needs identified. Pt Total A for basic ADLs, uses lift for transfers OOB. Will sign off. ? ?Layla Maw ?12/12/2021, 11:32 AM ?

## 2021-12-13 LAB — GLUCOSE, CAPILLARY
Glucose-Capillary: 108 mg/dL — ABNORMAL HIGH (ref 70–99)
Glucose-Capillary: 125 mg/dL — ABNORMAL HIGH (ref 70–99)
Glucose-Capillary: 96 mg/dL (ref 70–99)

## 2021-12-13 LAB — CBC WITH DIFFERENTIAL/PLATELET
Abs Immature Granulocytes: 0.02 10*3/uL (ref 0.00–0.07)
Basophils Absolute: 0 10*3/uL (ref 0.0–0.1)
Basophils Relative: 0 %
Eosinophils Absolute: 0.3 10*3/uL (ref 0.0–0.5)
Eosinophils Relative: 5 %
HCT: 31.8 % — ABNORMAL LOW (ref 36.0–46.0)
Hemoglobin: 10.9 g/dL — ABNORMAL LOW (ref 12.0–15.0)
Immature Granulocytes: 0 %
Lymphocytes Relative: 35 %
Lymphs Abs: 1.9 10*3/uL (ref 0.7–4.0)
MCH: 32.2 pg (ref 26.0–34.0)
MCHC: 34.3 g/dL (ref 30.0–36.0)
MCV: 94.1 fL (ref 80.0–100.0)
Monocytes Absolute: 0.4 10*3/uL (ref 0.1–1.0)
Monocytes Relative: 7 %
Neutro Abs: 2.9 10*3/uL (ref 1.7–7.7)
Neutrophils Relative %: 53 %
Platelets: 153 10*3/uL (ref 150–400)
RBC: 3.38 MIL/uL — ABNORMAL LOW (ref 3.87–5.11)
RDW: 13 % (ref 11.5–15.5)
WBC: 5.4 10*3/uL (ref 4.0–10.5)
nRBC: 0 % (ref 0.0–0.2)

## 2021-12-13 LAB — COMPREHENSIVE METABOLIC PANEL
ALT: 69 U/L — ABNORMAL HIGH (ref 0–44)
AST: 73 U/L — ABNORMAL HIGH (ref 15–41)
Albumin: 2.5 g/dL — ABNORMAL LOW (ref 3.5–5.0)
Alkaline Phosphatase: 100 U/L (ref 38–126)
Anion gap: 8 (ref 5–15)
BUN: 12 mg/dL (ref 8–23)
CO2: 22 mmol/L (ref 22–32)
Calcium: 8.1 mg/dL — ABNORMAL LOW (ref 8.9–10.3)
Chloride: 104 mmol/L (ref 98–111)
Creatinine, Ser: 0.63 mg/dL (ref 0.44–1.00)
GFR, Estimated: >60 mL/min (ref >60)
Glucose, Bld: 100 mg/dL — ABNORMAL HIGH (ref 70–99)
Potassium: 3.4 mmol/L — ABNORMAL LOW (ref 3.5–5.1)
Sodium: 134 mmol/L — ABNORMAL LOW (ref 135–145)
Total Bilirubin: 1 mg/dL (ref 0.3–1.2)
Total Protein: 5.4 g/dL — ABNORMAL LOW (ref 6.5–8.1)

## 2021-12-13 LAB — SARS CORONAVIRUS 2 (TAT 6-24 HRS): SARS Coronavirus 2: NEGATIVE

## 2021-12-13 LAB — PROCALCITONIN: Procalcitonin: 7.57 ng/mL

## 2021-12-13 LAB — CSF CULTURE W GRAM STAIN: Culture: NO GROWTH

## 2021-12-13 LAB — URINE CULTURE: Culture: NO GROWTH

## 2021-12-13 LAB — MAGNESIUM: Magnesium: 2 mg/dL (ref 1.7–2.4)

## 2021-12-13 MED ORDER — DIPHENHYDRAMINE-ZINC ACETATE 2-0.1 % EX CREA
TOPICAL_CREAM | Freq: Two times a day (BID) | CUTANEOUS | Status: DC | PRN
  Filled 2021-12-13: qty 28

## 2021-12-13 MED ORDER — POTASSIUM CHLORIDE CRYS ER 20 MEQ PO TBCR
40.0000 meq | EXTENDED_RELEASE_TABLET | Freq: Once | ORAL | Status: AC
  Administered 2021-12-13: 40 meq via ORAL
  Filled 2021-12-13: qty 2

## 2021-12-13 NOTE — Plan of Care (Signed)
  Problem: Health Behavior/Discharge Planning: Goal: Ability to manage health-related needs will improve Outcome: Progressing   Problem: Clinical Measurements: Goal: Ability to maintain clinical measurements within normal limits will improve Outcome: Progressing Goal: Will remain free from infection Outcome: Progressing   

## 2021-12-13 NOTE — Progress Notes (Signed)
PT Cancellation Note ? ?Patient Details ?Name: Claire West ?MRN: [id number] ?DOB: 10-29-1940 ? ? ?Cancelled Treatment:    Reason Eval/Treat Not Completed: PT screened, no needs identified, will sign off PT completed evaluation/discharge 3/6; please refer to that documentation for more information. Noted new orders 3/7 though no new PT needs identified. Pt Total A for basic ADLs, uses lift for transfers OOB. Recommend nursing staff utilize maxi move lift for transfers out of bed.  ? ?Wyona Almas, PT, DPT ?Acute Rehabilitation Services ?Pager [phone] ?Office [phone] ? ? ? ?[name] ?12/13/2021, 8:02 AM ?

## 2021-12-13 NOTE — Progress Notes (Signed)
PROGRESS NOTE                                                                                                                                                                                                             Patient Demographics:    Claire West, is a 81 y.o. female, DOB - 08/14/1941, MBW:[id number]  Outpatient Primary MD for the patient is Sharilyn Sites, MD    LOS - 3  Admit date - 12/10/2021    Chief Complaint  Patient presents with   Altered Mental Status       Brief Narrative (HPI from H&P)    - 81 y.o. female with history of Lewy body dementia and vascular dementia, hypertension, hyperlipidemia, previous stroke with left-sided weakness, wheelchair and bedbound at SNF, was recently treated for UTI with patient complaining of dysuria for the last 2 days.  This afternoon patient became more confused lethargic less responsive and was brought to the ER.  The ER she was admitted for sepsis, she was found to have UTI underwent LP to rule out meningitis.  She is was seen by neurology and hospitalist team was requested to admit the patient.   Subjective:    Claire West today she is more awake and appropriate, she denies any complaints.    Assessment  & Plan :   Acute metabolic encephalopathy - due to sepsis with most likely source being UTI -Mentation improving with treatment of sepsis. -Previous MD discussed CSF findings  with Dr. Lynnae Sandhoff does not represent a bacterial infection however we cannot rule out viral encephalitis completely.  HSV PCR is negative as well, acyclovir has been discontinued as well.     -EEG significant for diffuse encephalopathic, no seizure activities. -Patient significantly improved.  Sepsis due to UTI -Sepsis present on admission with evidence of endorgan damage including lactic acidosis, leukocytosis, and altered mental status -Initially on broad-spectrum vancomycin and  Rocephin given concern of meningitis, this has been ruled out currently, continue with Rocephin, I will discontinue her vancomycin today.   -Calcitonin trending down -Follow-up blood cultures  Hx of CVA with left-sided hemiparesis.  Continue aspirin and statin for secondary prevention as tolerated, she is not taking Plavix anymore.  No acute issues on MRI of the brain.  PT, OT and speech to follow-up.  Note patient is largely bedbound and  lives at an SNF.  Dyslipidemia.  -Continue with statin  Transaminitis - due to sepsis, trending down.  Elevated D-dimer.  -  CTA chest negative.   -Has Doppler significant for chronic DVT in the left femoral vein, no indication to anticoagulate given it it says is chronic.     Hypertension.  Beta-blocker as tolerated along with as needed IV hydralazine.  Early dementia.  Supportive care at risk for delirium.   Incidental CT finding -  Cellulitis around the left breast.  No fluid collection.  No significant finding on physical exam, I have discussed with sister, patient is up-to-date on her mammogram.   Hyponatremia -Improving  Hypokalemia -Pleated      Condition - Extremely Guarded  Family Communication  : Cussed with son by phone 3/7, discussed with sister at bedside 3/8.  Code Status :  Full  Consults  :  Neuro  PUD Prophylaxis : PPI   Procedures  :     CT Abd  - Pelvis - 1. Bilateral nonobstructing renal calculi. There may be slight thickening and enhancement of the renal collecting systems and ureters, suggesting urinary tract infection. No renal fluid collection. 2. Subcutaneous stranding medial to the left breast implant in the left anterior chest wall, may be infectious or inflammatory. No drainable collection or soft tissue gas. Recommend correlation with physical exam. Aortic Atherosclerosis (ICD10-I70.0)  MRI-A -  1. No acute intracranial process. 2. Severe focal stenosis in the proximal left P2 segment, which is likely similar  to the prior exam, although that exam was limited by motion. 3. Poor signal in the proximal right P3 segments, without visualization of the distal right P3s, which are likely significantly stenosed or occluded. 4. Encephalomalacia in the right parietal, occipital, and inferior temporal lobes, consistent with expected evolution of the previously noted right PCA territory infarct.      Disposition Plan  :    Status is: Inpatient    DVT Prophylaxis  :     Heparin - SCDs    Lab Results  Component Value Date   PLT 153 12/13/2021    Diet :  Diet Order             Diet Heart Room service appropriate? Yes with Assist; Fluid consistency: Thin; Fluid restriction: 1500 mL Fluid  Diet effective now                    Inpatient Medications  Scheduled Meds:  aspirin EC  81 mg Oral q morning   heparin injection (subcutaneous)  5,000 Units Subcutaneous Q8H   metoprolol succinate  50 mg Oral BID   pantoprazole (PROTONIX) IV  40 mg Intravenous Q24H   pravastatin  40 mg Oral QHS   Continuous Infusions:  cefTRIAXone (ROCEPHIN)  IV 2 g (12/13/21 0530)   vancomycin Stopped (12/13/21 0730)   PRN Meds:.acetaminophen **OR** acetaminophen, hydrALAZINE, labetalol  Antibiotics  :    Anti-infectives (From admission, onward)    Start     Dose/Rate Route Frequency Ordered Stop   12/13/21 0600  cefTRIAXone (ROCEPHIN) 2 g in sodium chloride 0.9 % 100 mL IVPB        2 g 200 mL/hr over 30 Minutes Intravenous Every 24 hours 12/12/21 1415     12/12/21 0600  vancomycin (VANCOREADY) IVPB 500 mg/100 mL        500 mg 100 mL/hr over 60 Minutes Intravenous Every 24 hours 12/11/21 1009     12/11/21 0800  acyclovir (ZOVIRAX)  600 mg in dextrose 5 % 100 mL IVPB  Status:  Discontinued        600 mg 112 mL/hr over 60 Minutes Intravenous Every 8 hours 12/11/21 0025 12/12/21 0849   12/11/21 0700  vancomycin (VANCOREADY) IVPB 500 mg/100 mL  Status:  Discontinued        500 mg 100 mL/hr over 60 Minutes  Intravenous Every 12 hours 12/10/21 1717 12/11/21 0903   12/11/21 0600  cefTRIAXone (ROCEPHIN) 2 g in sodium chloride 0.9 % 100 mL IVPB  Status:  Discontinued        2 g 200 mL/hr over 30 Minutes Intravenous Every 12 hours 12/10/21 2316 12/12/21 1415   12/11/21 0030  acyclovir (ZOVIRAX) 600 mg in dextrose 5 % 100 mL IVPB        600 mg 112 mL/hr over 60 Minutes Intravenous  Once 12/11/21 0025 12/11/21 0316   12/10/21 1600  aztreonam (AZACTAM) 2 g in sodium chloride 0.9 % 100 mL IVPB  Status:  Discontinued        2 g 200 mL/hr over 30 Minutes Intravenous  Once 12/10/21 1550 12/10/21 1551   12/10/21 1600  metroNIDAZOLE (FLAGYL) IVPB 500 mg        500 mg 100 mL/hr over 60 Minutes Intravenous  Once 12/10/21 1550 12/10/21 1748   12/10/21 1600  vancomycin (VANCOCIN) IVPB 1000 mg/200 mL premix        1,000 mg 200 mL/hr over 60 Minutes Intravenous  Once 12/10/21 1550 12/10/21 1844   12/10/21 1600  cefTRIAXone (ROCEPHIN) 2 g in sodium chloride 0.9 % 100 mL IVPB        2 g 200 mL/hr over 30 Minutes Intravenous  Once 12/10/21 1551 12/10/21 1722         Sujata Maines M.D on 12/13/2021 at 1:49 PM  To page go to [email or web address]   [facility] -  Office  [phone]  See all Orders from today for further details    Objective:   Vitals:   12/12/21 2312 12/13/21 0428 12/13/21 0736 12/13/21 1056  BP: (!) 149/72 (!) 154/66 (!) 188/97 (!) 163/67  Pulse: 88 76 89 76  Resp: '20 19 19 17  '$ Temp: 98.4 F (36.9 C)  98.4 F (36.9 C) 98 F (36.7 C)  TempSrc: Oral  Oral Oral  SpO2: 97% 96% 98% 97%  Weight:      Height:        Wt Readings from Last 3 Encounters:  12/10/21 61.2 kg  06/09/20 67.1 kg  05/26/20 68.9 kg     Intake/Output Summary (Last 24 hours) at 12/13/2021 1349 Last data filed at 12/13/2021 1308 Gross per 24 hour  Intake 197.08 ml  Output 3500 ml  Net -3302.92 ml     Physical Exam  Awake Alert, she is more conversant and appropriate today, she is with left-sided  hemiparesis Symmetrical Chest wall movement, Good air movement bilaterally, CTAB RRR,No Gallops,Rubs or new Murmurs, No Parasternal Heave +ve B.Sounds, Abd Soft, No tenderness, No rebound - guarding or rigidity. No Cyanosis, Clubbing or edema, No new Rash or bruise          Data Review:    CBC Recent Labs  Lab 12/10/21 1500 12/10/21 1557 12/11/21 0120 12/12/21 0117 12/13/21 0120  WBC 15.5*  --  14.4* 7.7 5.4  HGB 16.1* 16.0*   15.6* 13.6 11.9* 10.9*  HCT 47.9* 47.0*   46.0 39.7 34.0* 31.8*  PLT 221  --  164 153 153  MCV  95.2  --  94.3 93.4 94.1  MCH 32.0  --  32.3 32.7 32.2  MCHC 33.6  --  34.3 35.0 34.3  RDW 12.7  --  12.9 13.1 13.0  LYMPHSABS 0.1*  --  0.2* 1.1 1.9  MONOABS 0.3  --  0.6 0.4 0.4  EOSABS 0.1  --  0.0 0.3 0.3  BASOSABS 0.1  --  0.0 0.0 0.0    Electrolytes Recent Labs  Lab 12/10/21 1500 12/10/21 1549 12/10/21 1557 12/10/21 1748 12/10/21 2354 12/11/21 0120 12/11/21 0728 12/12/21 0117 12/13/21 0120  NA 138  --  139   139  --   --  135  --  131* 134*  K 3.8  --  3.8   3.8  --   --  3.5  --  3.3* 3.4*  CL 103  --  105  --   --  103  --  100 104  CO2 20*  --   --   --   --  20*  --  21* 22  GLUCOSE 120*  --  115*  --   --  120*  --  102* 100*  BUN 14  --  16  --   --  14  --  18 12  CREATININE 0.84  --  0.50  --   --  0.61  --  0.73 0.63  CALCIUM 9.3  --   --   --   --  8.3*  --  8.1* 8.1*  AST 117*  --   --   --   --  79*  --  112* 73*  ALT 82*  --   --   --   --  63*  --  81* 69*  ALKPHOS 94  --   --   --   --  64  --  87 100  BILITOT 2.3*  --   --   --   --  2.7*  --  2.1* 1.0  ALBUMIN 3.5  --   --   --   --  2.7*  --  2.5* 2.5*  MG  --   --   --   --   --   --  1.9 1.9 2.0  CRP  --   --   --   --   --   --  21.0* 19.2*  --   DDIMER  --   --   --  7.31*  --   --   --   --   --   PROCALCITON  --   --   --   --   --   --  21.57 16.17 7.57  LATICACIDVEN  --  4.1*  --  5.4* 3.6* 2.1* 3.3*  --   --   INR  --   --   --  1.2  --   --   --   --    --   TSH  --   --   --   --   --   --   --  4.025  --   BNP 506.7*  --   --   --   --   --  263.6* 50.2  --     ------------------------------------------------------------------------------------------------------------------ No results for input(s): CHOL, HDL, LDLCALC, TRIG, CHOLHDL, LDLDIRECT in the last 72 hours.  Lab Results  Component Value Date   HGBA1C 5.3 12/26/2019    Recent Labs    12/12/21 0117  TSH 4.025   ------------------------------------------------------------------------------------------------------------------ ID Labs Recent Labs  Lab 12/10/21 1500 12/10/21 1549 12/10/21 1557 12/10/21 1748 12/10/21 2354 12/11/21 0120 12/11/21 0728 12/12/21 0117 12/13/21 0120  WBC 15.5*  --   --   --   --  14.4*  --  7.7 5.4  PLT 221  --   --   --   --  164  --  153 153  CRP  --   --   --   --   --   --  21.0* 19.2*  --   DDIMER  --   --   --  7.31*  --   --   --   --   --   PROCALCITON  --   --   --   --   --   --  21.57 16.17 7.57  LATICACIDVEN  --  4.1*  --  5.4* 3.6* 2.1* 3.3*  --   --   CREATININE 0.84  --  0.50  --   --  0.61  --  0.73 0.63   Cardiac Enzymes No results for input(s): CKMB, TROPONINI, MYOGLOBIN in the last 168 hours.  Invalid input(s): CK     Radiology Reports CT HEAD WO CONTRAST  Result Date: 12/10/2021 CLINICAL DATA:  Patient more lethargic. Change in behavior/mental status. Recently treated for a UTI. EXAM: CT HEAD WITHOUT CONTRAST TECHNIQUE: Contiguous axial images were obtained from the base of the skull through the vertex without intravenous contrast. RADIATION DOSE REDUCTION: This exam was performed according to the departmental dose-optimization program which includes automated exposure control, adjustment of the mA and/or kV according to patient size and/or use of iterative reconstruction technique. COMPARISON:  03/15/2020 FINDINGS: Brain: No evidence of acute infarction, hemorrhage, hydrocephalus, extra-axial collection or mass  lesion/mass effect. Old right occipital/parietal infarct, unchanged. Bilateral periventricular white matter hypoattenuation consistent with advanced chronic microvascular ischemic change. Vascular: No hyperdense vessel or unexpected calcification. Skull: Normal. Negative for fracture or focal lesion. Sinuses/Orbits: Globes and orbits are unremarkable. Visualized sinuses are clear. Other: None. IMPRESSION: 1. No acute intracranial abnormalities. 2. Old right occipital/parietal lobe infarct and advanced chronic microvascular ischemic change, stable compared to the previous head CT. Electronically Signed   By: Lajean Manes M.D.   On: 12/10/2021 16:07   CT Angio Chest PE W and/or Wo Contrast  Result Date: 12/10/2021 CLINICAL DATA:  Lethargy and recent UTI. EXAM: CT ANGIOGRAPHY CHEST WITH CONTRAST TECHNIQUE: Multidetector CT imaging of the chest was performed using the standard protocol during bolus administration of intravenous contrast. Multiplanar CT image reconstructions and MIPs were obtained to evaluate the vascular anatomy. RADIATION DOSE REDUCTION: This exam was performed according to the departmental dose-optimization program which includes automated exposure control, adjustment of the mA and/or kV according to patient size and/or use of iterative reconstruction technique. CONTRAST:  29m OMNIPAQUE IOHEXOL 350 MG/ML SOLN COMPARISON:  March 21, 2020 and July 01, 2019 FINDINGS: Cardiovascular: There is marked severity calcification of the aortic arch and descending thoracic aorta, without evidence of aortic aneurysm or dissection. Satisfactory opacification of the pulmonary arteries to the segmental level. No evidence of pulmonary embolism. There is mild cardiomegaly with moderate severity coronary artery calcification. A trace amount of pericardial fluid is seen. Mediastinum/Nodes: No enlarged mediastinal, hilar, or axillary lymph nodes. Thyroid gland, trachea, and esophagus demonstrate no significant  findings. Lungs/Pleura: Mild biapical scarring and/or atelectasis is seen. This extends along the posterior aspect of the right upper lobe and right lower lobe. Mild  atelectasis is also seen within the posterior aspect of the left lung base. A trace amount of pleural fluid is noted, bilaterally. No pneumothorax is identified. Upper Abdomen: A stable 17 mm x 11 mm focus of parenchymal low attenuation (approximately 26.33 Hounsfield units) is seen within the posterior aspect of the left lobe of the liver. Musculoskeletal: Bilateral partially calcified breast implants are seen. Multilevel degenerative changes are noted throughout the thoracic spine. Review of the MIP images confirms the above findings. IMPRESSION: 1. No evidence of pulmonary embolism. 2. Mild biapical, posterior right upper lobe, posterior right lower lobe and left basilar scarring and/or atelectasis. 3. Trace bilateral pleural effusions. 4. Trace amount of pleural fluid, bilaterally. 5. Mild cardiomegaly with moderate severity coronary artery calcification. 6. Stable hepatic cyst. 7. Aortic atherosclerosis. Aortic Atherosclerosis (ICD10-I70.0). Electronically Signed   By: Virgina Norfolk M.D.   On: 12/10/2021 23:05   MR ANGIO HEAD WO CONTRAST  Result Date: 12/10/2021 CLINICAL DATA:  Altered mental status EXAM: MRI HEAD WITHOUT CONTRAST MRA HEAD WITHOUT CONTRAST TECHNIQUE: Multiplanar, multi-echo pulse sequences of the brain and surrounding structures were acquired without intravenous contrast. Angiographic images of the Circle of Willis were acquired using MRA technique without intravenous contrast. COMPARISON:  MRI MRA 12/25/2019, correlation is also made with CT head 12/10/2021 and CTA head 12/25/2019 FINDINGS: MRI HEAD FINDINGS Brain: No restricted diffusion to suggest acute or subacute infarct. Encephalomalacia in the right medial occipital lobe, medial parietal lobe, inferomedial temporal lobe, and hippocampus, consistent with evolution of  the PCA territory infarcts noted on 12/25/2019. Some of these areas are associated with hemosiderin deposition, consistent with petechial hemorrhage. No acute hemorrhage, mass, mass effect, or midline shift. No hydrocephalus or extra-axial collection. Confluent T2 hyperintense signal in the periventricular white matter, likely the sequela of severe chronic small vessel ischemic disease. Lacunar infarcts in the left pons, right thalamus, and bilateral basal ganglia. Vascular: Please see MRA below. Skull and upper cervical spine: Normal marrow signal. Degenerative changes at C3-C4, with mild anterolisthesis, and at C4-C5, likely causing moderate spinal canal stenosis. Sinuses/Orbits: No acute or significant finding. Status post bilateral lens replacements. Other: None. MRA HEAD FINDINGS Anterior circulation: Both internal carotid arteries are patent to the termini, without significant stenosis. Aplastic or stenosed right A1. Normal left A1. Normal anterior communicating artery. Anterior cerebral arteries are patent to their distal aspects. No M1 stenosis or occlusion. Normal MCA bifurcations. Distal MCA branches perfused and symmetric. Posterior circulation: Imaged portions of the vertebral arteries patent to the vertebrobasilar junction without stenosis. Basilar patent to its distal aspect. Superior cerebellar arteries patent bilaterally. Patent P1 segments. Severe focal stenosis in the proximal left P2 segment (series 5, image 55), which is likely similar to the prior exam, although that exam was limited by motion. Left PCA branches are otherwise perfused to their distal aspects without stenosis. The right P2 segments are patent, although there is poor signal in the proximal right P3 segments, with distal right P3s not visualized. The bilateral posterior communicating arteries are not visualized. Anatomic variants: None significant IMPRESSION: 1. No acute intracranial process. 2. Severe focal stenosis in the  proximal left P2 segment, which is likely similar to the prior exam, although that exam was limited by motion. 3. Poor signal in the proximal right P3 segments, without visualization of the distal right P3s, which are likely significantly stenosed or occluded. 4. Encephalomalacia in the right parietal, occipital, and inferior temporal lobes, consistent with expected evolution of the previously noted right PCA territory  infarct. Electronically Signed   By: Merilyn Baba M.D.   On: 12/10/2021 21:50   MR BRAIN WO CONTRAST  Result Date: 12/10/2021 CLINICAL DATA:  Altered mental status EXAM: MRI HEAD WITHOUT CONTRAST MRA HEAD WITHOUT CONTRAST TECHNIQUE: Multiplanar, multi-echo pulse sequences of the brain and surrounding structures were acquired without intravenous contrast. Angiographic images of the Circle of Willis were acquired using MRA technique without intravenous contrast. COMPARISON:  MRI MRA 12/25/2019, correlation is also made with CT head 12/10/2021 and CTA head 12/25/2019 FINDINGS: MRI HEAD FINDINGS Brain: No restricted diffusion to suggest acute or subacute infarct. Encephalomalacia in the right medial occipital lobe, medial parietal lobe, inferomedial temporal lobe, and hippocampus, consistent with evolution of the PCA territory infarcts noted on 12/25/2019. Some of these areas are associated with hemosiderin deposition, consistent with petechial hemorrhage. No acute hemorrhage, mass, mass effect, or midline shift. No hydrocephalus or extra-axial collection. Confluent T2 hyperintense signal in the periventricular white matter, likely the sequela of severe chronic small vessel ischemic disease. Lacunar infarcts in the left pons, right thalamus, and bilateral basal ganglia. Vascular: Please see MRA below. Skull and upper cervical spine: Normal marrow signal. Degenerative changes at C3-C4, with mild anterolisthesis, and at C4-C5, likely causing moderate spinal canal stenosis. Sinuses/Orbits: No acute or  significant finding. Status post bilateral lens replacements. Other: None. MRA HEAD FINDINGS Anterior circulation: Both internal carotid arteries are patent to the termini, without significant stenosis. Aplastic or stenosed right A1. Normal left A1. Normal anterior communicating artery. Anterior cerebral arteries are patent to their distal aspects. No M1 stenosis or occlusion. Normal MCA bifurcations. Distal MCA branches perfused and symmetric. Posterior circulation: Imaged portions of the vertebral arteries patent to the vertebrobasilar junction without stenosis. Basilar patent to its distal aspect. Superior cerebellar arteries patent bilaterally. Patent P1 segments. Severe focal stenosis in the proximal left P2 segment (series 5, image 55), which is likely similar to the prior exam, although that exam was limited by motion. Left PCA branches are otherwise perfused to their distal aspects without stenosis. The right P2 segments are patent, although there is poor signal in the proximal right P3 segments, with distal right P3s not visualized. The bilateral posterior communicating arteries are not visualized. Anatomic variants: None significant IMPRESSION: 1. No acute intracranial process. 2. Severe focal stenosis in the proximal left P2 segment, which is likely similar to the prior exam, although that exam was limited by motion. 3. Poor signal in the proximal right P3 segments, without visualization of the distal right P3s, which are likely significantly stenosed or occluded. 4. Encephalomalacia in the right parietal, occipital, and inferior temporal lobes, consistent with expected evolution of the previously noted right PCA territory infarct. Electronically Signed   By: Merilyn Baba M.D.   On: 12/10/2021 21:50   CT ABDOMEN PELVIS W CONTRAST  Result Date: 12/10/2021 CLINICAL DATA:  Nausea and vomiting.  Complicated UTI.  Sepsis. EXAM: CT ABDOMEN AND PELVIS WITH CONTRAST TECHNIQUE: Multidetector CT imaging of the  abdomen and pelvis was performed using the standard protocol following bolus administration of intravenous contrast. RADIATION DOSE REDUCTION: This exam was performed according to the departmental dose-optimization program which includes automated exposure control, adjustment of the mA and/or kV according to patient size and/or use of iterative reconstruction technique. CONTRAST:  44m OMNIPAQUE IOHEXOL 300 MG/ML  SOLN COMPARISON:  Remote CT 07/09/2012 FINDINGS: Lower chest: Trace bilateral pleural thickening and adjacent basilar atelectasis. The heart is enlarged. There are bilateral breast implants. Subcutaneous stranding is noted medial  to the left breast implant in the left anterior chest wall, series 3, image 11. No drainable collection or soft tissue gas. Hepatobiliary: 11 mm hypodense lesion in the left hepatic lobe, slightly smaller than on remote prior exam. Focal fatty infiltration adjacent to the falciform ligament. Motion artifact through the gallbladder area and mid lower liver limits assessment. No obvious calcified gallstone. There is no biliary dilatation. Pancreas: Parenchymal atrophy. No ductal dilatation or inflammation. Spleen: Normal in size without focal abnormality. Adrenals/Urinary Tract: Normal adrenal glands. 10 mm nonobstructing stone in the upper right kidney. Slight prominence of the right renal pelvis but no hydronephrosis. There may be slight right ureteral thickening and enhancement. There is faint right perinephric edema, partially motion obscured. No evidence of focal renal lesion. No renal fluid collection. 9 mm stone in the left renal pelvis but no hydronephrosis or caliceal dilatation. There is also a 6 mm intrarenal calculi in the left upper pole. There may be slight thickening and enhancement of the left renal pelvis and ureter. No renal fluid collection. The urinary bladder is nondistended, no bladder stone or wall thickening. Stomach/Bowel: Detailed bowel assessment is  limited in the absence of enteric contrast and motion artifact. The stomach is decompressed. There is no small bowel obstruction or evidence of inflammation. The appendix is not confidently visualized on the current exam. Decompressed ascending colon. Small volume of stool throughout the remainder of the colon. No colonic inflammation. There is stool distending the rectum. Vascular/Lymphatic: Moderate to advanced aortic atherosclerosis. No aortic aneurysm. Patent portal vein. No abdominopelvic adenopathy. Reproductive: Status post hysterectomy. No adnexal masses. Other: No free air or ascites. Small fat containing umbilical hernia. Musculoskeletal: Scoliosis and diffuse degenerative change in the lumbar spine. Slight superior endplate compression deformity of L2 and L3. The bones are under mineralized. Surgical hardware in the right proximal femur. No evidence of focal bone lesion or acute osseous finding. IMPRESSION: 1. Bilateral nonobstructing renal calculi. There may be slight thickening and enhancement of the renal collecting systems and ureters, suggesting urinary tract infection. No renal fluid collection. 2. Subcutaneous stranding medial to the left breast implant in the left anterior chest wall, may be infectious or inflammatory. No drainable collection or soft tissue gas. Recommend correlation with physical exam. Aortic Atherosclerosis (ICD10-I70.0). Electronically Signed   By: Keith Rake M.D.   On: 12/10/2021 18:28   DG Chest Port 1 View  Result Date: 12/10/2021 CLINICAL DATA:  Questionable sepsis.  Altered mental status. EXAM: PORTABLE CHEST 1 VIEW COMPARISON:  04/13/2020 FINDINGS: Borderline cardiomegaly.The cardiomediastinal contours are unchanged with atherosclerosis. The lungs are clear. Pulmonary vasculature is normal. No consolidation, pleural effusion, or pneumothorax. No acute osseous abnormalities are seen. Calcified breast implants. IMPRESSION: Low lung volumes with borderline  cardiomegaly. No acute chest findings. Electronically Signed   By: Keith Rake M.D.   On: 12/10/2021 17:12   EEG adult  Result Date: 12/11/2021 Lora Havens, MD     12/11/2021  2:51 PM Patient Name: JUSTISS GERBINO MRN: [id number] Epilepsy Attending: Lora Havens Referring Physician/Provider: Amie Portland, MD Date: 12/11/2021 Duration: 21.55 mins Patient history: 81 year old female with prior stroke presented with altered mental status.  EEG evaluate for seizure. Level of alertness: Awake AEDs during EEG study: None Technical aspects: This EEG study was done with scalp electrodes positioned according to the 10-20 International system of electrode placement. Electrical activity was acquired at a sampling rate of '500Hz'$  and reviewed with a high frequency filter of '70Hz'$  and a low  frequency filter of '1Hz'$ . EEG data were recorded continuously and digitally stored. Description: The posterior dominant rhythm consists of 7.5 Hz activity of moderate voltage (25-35 uV) seen predominantly in posterior head regions, symmetric and reactive to eye opening and eye closing.  EEG showed continuous generalized 3 to 7 Hz theta-delta slowing, at times appears sharply contoured with triphasic morphology.  Hyperventilation and photic stimulation were not performed.   ABNORMALITY - Continuous slow, generalized IMPRESSION: This study is suggestive of moderate diffuse encephalopathy, nonspecific etiology. No seizures or definite epileptiform discharges were seen throughout the recording. Priyanka O Yadav   VAS [place] LOWER EXTREMITY VENOUS (DVT)  Result Date: 12/11/2021  Lower Venous DVT Study Patient Name:  BIRGIT NOWLING  Date of Exam:   12/11/2021 Medical Rec #: [id number]         Accession #:    [id number] Date of Birth: Oct 25, 1940         Patient Gender: F Patient Age:   75 years Exam Location:  [facility] Procedure:      VAS [place] LOWER EXTREMITY VENOUS (DVT) Referring Phys: [name] [facility]  --------------------------------------------------------------------------------  Indications: D-dimer elevated.  Risk Factors: Immobility. Limitations: Poor ultrasound/tissue interface and shadowing due to overlying arterial calcification and patient limited mobility. Comparison Study: 12-26-2019 Prior bilateral lower extremity venous was negative                   for DVT. Performing Technologist: Darlin Coco RDMS, RVT  Examination Guidelines: A complete evaluation includes B-mode imaging, spectral Doppler, color Doppler, and power Doppler as needed of all accessible portions of each vessel. Bilateral testing is considered an integral part of a complete examination. Limited examinations for reoccurring indications may be performed as noted. The reflux portion of the exam is performed with the patient in reverse Trendelenburg.  +---------+---------------+---------+-----------+----------+-------------------+  RIGHT     Compressibility Phasicity Spontaneity Properties Thrombus Aging       +---------+---------------+---------+-----------+----------+-------------------+  CFV       Full            Yes       Yes                                         +---------+---------------+---------+-----------+----------+-------------------+  SFJ       Full                                                                  +---------+---------------+---------+-----------+----------+-------------------+  FV Prox   Full                                                                  +---------+---------------+---------+-----------+----------+-------------------+  FV Mid    Full                                                                  +---------+---------------+---------+-----------+----------+-------------------+  FV Distal Full                                                                  +---------+---------------+---------+-----------+----------+-------------------+  PFV       Full                                                                   +---------+---------------+---------+-----------+----------+-------------------+  POP       Full            Yes       Yes                                         +---------+---------------+---------+-----------+----------+-------------------+  PTV       Full                                                                  +---------+---------------+---------+-----------+----------+-------------------+  PERO      Full                                             Some segments not                                                                well visualized      +---------+---------------+---------+-----------+----------+-------------------+   +---------+---------------+---------+-----------+----------+-------------------+  LEFT      Compressibility Phasicity Spontaneity Properties Thrombus Aging       +---------+---------------+---------+-----------+----------+-------------------+  CFV       Full            Yes       Yes                                         +---------+---------------+---------+-----------+----------+-------------------+  SFJ       Full                                                                  +---------+---------------+---------+-----------+----------+-------------------+  FV Prox   Partial         Yes  Yes                    Chronic              +---------+---------------+---------+-----------+----------+-------------------+  FV Mid    Partial         Yes       Yes                    Chronic              +---------+---------------+---------+-----------+----------+-------------------+  FV Distal Partial         Yes       Yes                    Chronic              +---------+---------------+---------+-----------+----------+-------------------+  PFV       Full                                                                  +---------+---------------+---------+-----------+----------+-------------------+  POP       Full            Yes       Yes                                          +---------+---------------+---------+-----------+----------+-------------------+  PTV       Full                                                                  +---------+---------------+---------+-----------+----------+-------------------+  PERO      Full                                             Some segments not                                                                well visualized      +---------+---------------+---------+-----------+----------+-------------------+     Summary: RIGHT: - There is no evidence of deep vein thrombosis in the lower extremity. However, portions of this examination were limited- see technologist comments above.  - No cystic structure found in the popliteal fossa.  LEFT: - Findings consistent with chronic deep vein thrombosis involving the left femoral vein. - No cystic structure found in the popliteal fossa.  *See table(s) above for measurements and observations. Electronically signed by Servando Snare MD on 12/11/2021 at 5:13:45 PM.    Final

## 2021-12-13 NOTE — Care Management Important Message (Signed)
Important Message ? ?Patient Details  ?Name: Claire West ?MRN: [id number] ?Date of Birth: [birth year] ? ? ?Medicare Important Message Given:  Yes ? ? ? ? ?Shelda Altes ?12/13/2021, 8:47 AM ?

## 2021-12-14 LAB — GLUCOSE, CAPILLARY: Glucose-Capillary: 107 mg/dL — ABNORMAL HIGH (ref 70–99)

## 2021-12-14 MED ORDER — HYDRALAZINE HCL 25 MG PO TABS
25.0000 mg | ORAL_TABLET | Freq: Four times a day (QID) | ORAL | Status: DC | PRN
  Administered 2021-12-14: 19:00:00 25 mg via ORAL
  Filled 2021-12-14: qty 1

## 2021-12-14 MED ORDER — HYDROCODONE-ACETAMINOPHEN 5-325 MG PO TABS: 1.0000 | ORAL_TABLET | Freq: Two times a day (BID) | ORAL | 0 refills | Status: AC | PRN

## 2021-12-14 MED ORDER — SODIUM CHLORIDE 0.9 % IV SOLN
INTRAVENOUS | Status: DC | PRN
Start: 2021-12-14 — End: 2021-12-15

## 2021-12-14 MED ORDER — CEPHALEXIN 500 MG PO CAPS
500.0000 mg | ORAL_CAPSULE | Freq: Two times a day (BID) | ORAL | 0 refills | Status: AC
Start: 2021-12-14 — End: 2021-12-17

## 2021-12-14 MED ORDER — ACETAMINOPHEN 325 MG PO TABS: 650.0000 mg | ORAL_TABLET | Freq: Four times a day (QID) | ORAL | Status: DC | PRN

## 2021-12-14 MED ORDER — TRAZODONE HCL 50 MG PO TABS: 50.0000 mg | ORAL_TABLET | Freq: Every evening | ORAL | 0 refills | Status: AC | PRN

## 2021-12-14 MED ORDER — PANTOPRAZOLE SODIUM 40 MG PO TBEC: 40.0000 mg | DELAYED_RELEASE_TABLET | Freq: Every day | ORAL | 0 refills | Status: DC

## 2021-12-14 NOTE — Discharge Summary (Addendum)
Physician Discharge Summary  Claire West QJJ:[id number] DOB: 07/07/1941 DOA: 12/10/2021  PCP: Sharilyn Sites, MD  Admit date: 12/10/2021 Discharge date: 12/14/2021  Admitted From: SNF Disposition:  SNF  Recommendations for Outpatient Follow-up:  Follow up with PCP in 1-2 weeks Please obtain BMP/CBC in one week   Discharge Condition:Stable CODE STATUS:FULL Diet recommendation: Heart Healthy   Brief/Interim Summary: - 81 y.o. female with history of Lewy body dementia and vascular dementia, hypertension, hyperlipidemia, previous stroke with left-sided weakness, wheelchair and bedbound at SNF, was recently treated for UTI with patient complaining of dysuria for the last 2 days.  This afternoon patient became more confused lethargic less responsive and was brought to the ER.  The ER she was admitted for sepsis, she was found to have UTI underwent LP to rule out meningitis.  She is was seen by neurology and hospitalist team was requested to admit the patient.   Acute metabolic encephalopathy - due to sepsis with most likely source being UTI -Mentation improving with treatment of sepsis.  Back to baseline. -LP findings nonconcerning for bacterial infectious process, so bacterial meningitis coverage has been discontinued, HSV PCR is negative as well, acyclovir has been discontinued as well.     -EEG significant for diffuse encephalopathic, no seizure activities. -Patient significantly improved.   Sepsis (POA) due to UTI -Sepsis present on admission with evidence of endorgan damage including lactic acidosis, leukocytosis, and altered mental status -Initially on broad-spectrum vancomycin and Rocephin given concern of meningitis, this has been ruled out currently, he was kept on Rocephin during hospital stay, she will be discharged on Keflex to finish total of 7 days treatment.     -pro Calcitonin trending down -Follow-up blood cultures   Hx of CVA with left-sided hemiparesis.  Continue aspirin  and statin for secondary prevention as tolerated, she is not taking Plavix anymore.  No acute issues on MRI of the brain.  PT, OT and speech to follow-up.  Note patient is largely bedbound and lives at an ALF   Dyslipidemia.  -Continue with statin   Transaminitis - due to sepsis, trending down.   Elevated D-dimer.  -  CTA chest negative.   -Has Doppler significant for chronic DVT in the left femoral vein, no indication to anticoagulate given it  is chronic.     Hypertension.  Home home medications   Early dementia.  Supportive care at risk for delirium.   Incidental CT finding -  Cellulitis around the left breast.  No fluid collection.  No significant finding on physical exam, I have discussed with sister in law , patient is up-to-date on her mammogram.    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    Activity: As tolerated with Full fall precautions use walker/cane & assistance as needed   Disposition ALF   Diet: Heart Healthy    On your next visit with your primary care physician please  Get Medicines reviewed and adjusted.   Please request your Prim.MD to go over all Hospital Tests and Procedure/Radiological results at the follow up, please get all Hospital records sent to your Prim MD by signing hospital release before you go home.   If you experience worsening of your admission symptoms, develop shortness of breath, life threatening emergency, suicidal or  homicidal thoughts you must seek medical attention immediately by calling 911 or calling your MD immediately  if symptoms less severe.  You Must read complete instructions/literature along with all the possible adverse reactions/side effects for all the Medicines you take and that have been prescribed to you. Take any new Medicines after you have completely understood and accpet all the possible adverse reactions/side effects.   Do not drive, operating heavy machinery, perform activities at heights, swimming or participation in water activities or provide baby sitting services if your were admitted for syncope or siezures until you have seen by Primary MD or a Neurologist and advised to do so again.  Do not drive when taking Pain medications.    Do not take more than prescribed Pain, Sleep and Anxiety Medications  Special Instructions: If you have smoked or chewed Tobacco  in the last 2 yrs please stop smoking, stop any regular Alcohol  and or any Recreational drug use.  Wear Seat belts while driving.   Please note  You were cared for by a hospitalist during your hospital stay. If you have any questions about your discharge medications or the care you received while you were in the hospital after you are discharged, you can call the unit and asked to speak with the hospitalist on call if the hospitalist that took care of you is not available. Once you are discharged, your primary care physician will handle any further medical issues. Please note that NO REFILLS for any discharge medications will be authorized once you are discharged, as it is imperative that you return to your primary care physician (or establish a relationship with a primary care physician if you do not have one) for your aftercare needs so that they can reassess your need for medications and monitor your lab values.      Allergies as of 12/14/2021       Reactions   Penicillins Anaphylaxis, Swelling, Rash   Oral  rash/peeling **Has tolerated Rocephin, Vantin since 2017**   Adhesive [tape] Itching, Rash   Irritation at site   Codeine Hives, Rash   Neosporin [neomycin-bacitracin Zn-polymyx] Rash   Sulfa Antibiotics Hives, Rash        Medication List     STOP taking these medications    nitrofurantoin (macrocrystal-monohydrate) 100 MG capsule Commonly known as: MACROBID       TAKE these medications    acetaminophen 325 MG tablet Commonly known as: TYLENOL Take 2 tablets (650 mg total) by mouth every 6 (six) hours as needed for mild pain (or Fever >/= 101). What changed:  when to take this reasons to take this   amLODipine 10 MG tablet Commonly known as: NORVASC Take 10 mg by mouth daily.   ARTIFICIAL TEAR OP Place 2 drops into both eyes in the morning and at bedtime.   aspirin EC 81 MG tablet Take 81 mg by mouth every morning.   Biofreeze 4 % Gel Generic drug: Menthol (Topical Analgesic) Apply 1 application topically 3 (three) times daily. Right hip, knees and or any area of pain   busPIRone 5 MG tablet Commonly known as: BUSPAR Take  5 mg by mouth 2 (two) times daily.   calcium carbonate 500 MG chewable tablet Commonly known as: TUMS - dosed in mg elemental calcium Chew 1 tablet by mouth daily.   cephALEXin 500 MG capsule Commonly known as: KEFLEX Take 1 capsule (500 mg total) by mouth 2 (two) times daily for 3 days.   CERTAVITE SENIOR PO Take 1 tablet by mouth daily.   cholecalciferol 25 MCG (1000 UNIT) tablet Commonly known as: VITAMIN D Take 1,000 Units by mouth daily at 12 noon.   escitalopram 20 MG tablet Commonly known as: LEXAPRO Take 20 mg by mouth daily.   famotidine 20 MG tablet Commonly known as: PEPCID Take 20 mg by mouth every evening.   fexofenadine 180 MG tablet Commonly known as: ALLEGRA Take 180 mg by mouth daily as needed for allergies or rhinitis.   HYDROcodone-acetaminophen 5-325 MG tablet Commonly known as: NORCO/VICODIN Take 1  tablet by mouth 2 (two) times daily as needed for moderate pain.   LACTOBACILLUS PO Take 10 Billion Cells by mouth daily.   magnesium oxide 400 MG tablet Commonly known as: MAG-OX Take 400 mg by mouth at bedtime.   metoprolol succinate 50 MG 24 hr tablet Commonly known as: TOPROL-XL Take 50 mg by mouth 2 (two) times daily.   olmesartan 40 MG tablet Commonly known as: BENICAR Take 40 mg by mouth daily.   pantoprazole 40 MG tablet Commonly known as: Protonix Take 1 tablet (40 mg total) by mouth daily.   polyethylene glycol 17 g packet Commonly known as: MIRALAX / GLYCOLAX Take 17 g by mouth every Monday, Wednesday, and Friday.   potassium chloride 10 MEQ CR capsule Commonly known as: MICRO-K Take 10 mEq by mouth daily.   pravastatin 40 MG tablet Commonly known as: PRAVACHOL Take 40 mg by mouth at bedtime.   traZODone 50 MG tablet Commonly known as: DESYREL Take 1 tablet (50 mg total) by mouth at bedtime as needed for sleep.        Allergies  Allergen Reactions   Penicillins Anaphylaxis, Swelling and Rash    Oral rash/peeling  **Has tolerated Rocephin, Vantin since 2017**     Adhesive [Tape] Itching and Rash    Irritation at site   Codeine Hives and Rash   Neosporin [Neomycin-Bacitracin Zn-Polymyx] Rash   Sulfa Antibiotics Hives and Rash    Consultations: Neurology    Procedures/Studies: CT HEAD WO CONTRAST  Result Date: 12/10/2021 CLINICAL DATA:  Patient more lethargic. Change in behavior/mental status. Recently treated for a UTI. EXAM: CT HEAD WITHOUT CONTRAST TECHNIQUE: Contiguous axial images were obtained from the base of the skull through the vertex without intravenous contrast. RADIATION DOSE REDUCTION: This exam was performed according to the departmental dose-optimization program which includes automated exposure control, adjustment of the mA and/or kV according to patient size and/or use of iterative reconstruction technique. COMPARISON:   03/15/2020 FINDINGS: Brain: No evidence of acute infarction, hemorrhage, hydrocephalus, extra-axial collection or mass lesion/mass effect. Old right occipital/parietal infarct, unchanged. Bilateral periventricular white matter hypoattenuation consistent with advanced chronic microvascular ischemic change. Vascular: No hyperdense vessel or unexpected calcification. Skull: Normal. Negative for fracture or focal lesion. Sinuses/Orbits: Globes and orbits are unremarkable. Visualized sinuses are clear. Other: None. IMPRESSION: 1. No acute intracranial abnormalities. 2. Old right occipital/parietal lobe infarct and advanced chronic microvascular ischemic change, stable compared to the previous head CT. Electronically Signed   By: Lajean Manes M.D.   On: 12/10/2021 16:07   CT Angio Chest PE W  and/or Wo Contrast  Result Date: 12/10/2021 CLINICAL DATA:  Lethargy and recent UTI. EXAM: CT ANGIOGRAPHY CHEST WITH CONTRAST TECHNIQUE: Multidetector CT imaging of the chest was performed using the standard protocol during bolus administration of intravenous contrast. Multiplanar CT image reconstructions and MIPs were obtained to evaluate the vascular anatomy. RADIATION DOSE REDUCTION: This exam was performed according to the departmental dose-optimization program which includes automated exposure control, adjustment of the mA and/or kV according to patient size and/or use of iterative reconstruction technique. CONTRAST:  55m OMNIPAQUE IOHEXOL 350 MG/ML SOLN COMPARISON:  March 21, 2020 and July 01, 2019 FINDINGS: Cardiovascular: There is marked severity calcification of the aortic arch and descending thoracic aorta, without evidence of aortic aneurysm or dissection. Satisfactory opacification of the pulmonary arteries to the segmental level. No evidence of pulmonary embolism. There is mild cardiomegaly with moderate severity coronary artery calcification. A trace amount of pericardial fluid is seen. Mediastinum/Nodes: No  enlarged mediastinal, hilar, or axillary lymph nodes. Thyroid gland, trachea, and esophagus demonstrate no significant findings. Lungs/Pleura: Mild biapical scarring and/or atelectasis is seen. This extends along the posterior aspect of the right upper lobe and right lower lobe. Mild atelectasis is also seen within the posterior aspect of the left lung base. A trace amount of pleural fluid is noted, bilaterally. No pneumothorax is identified. Upper Abdomen: A stable 17 mm x 11 mm focus of parenchymal low attenuation (approximately 26.33 Hounsfield units) is seen within the posterior aspect of the left lobe of the liver. Musculoskeletal: Bilateral partially calcified breast implants are seen. Multilevel degenerative changes are noted throughout the thoracic spine. Review of the MIP images confirms the above findings. IMPRESSION: 1. No evidence of pulmonary embolism. 2. Mild biapical, posterior right upper lobe, posterior right lower lobe and left basilar scarring and/or atelectasis. 3. Trace bilateral pleural effusions. 4. Trace amount of pleural fluid, bilaterally. 5. Mild cardiomegaly with moderate severity coronary artery calcification. 6. Stable hepatic cyst. 7. Aortic atherosclerosis. Aortic Atherosclerosis (ICD10-I70.0). Electronically Signed   By: TVirgina NorfolkM.D.   On: 12/10/2021 23:05   MR ANGIO HEAD WO CONTRAST  Result Date: 12/10/2021 CLINICAL DATA:  Altered mental status EXAM: MRI HEAD WITHOUT CONTRAST MRA HEAD WITHOUT CONTRAST TECHNIQUE: Multiplanar, multi-echo pulse sequences of the brain and surrounding structures were acquired without intravenous contrast. Angiographic images of the Circle of Willis were acquired using MRA technique without intravenous contrast. COMPARISON:  MRI MRA 12/25/2019, correlation is also made with CT head 12/10/2021 and CTA head 12/25/2019 FINDINGS: MRI HEAD FINDINGS Brain: No restricted diffusion to suggest acute or subacute infarct. Encephalomalacia in the right  medial occipital lobe, medial parietal lobe, inferomedial temporal lobe, and hippocampus, consistent with evolution of the PCA territory infarcts noted on 12/25/2019. Some of these areas are associated with hemosiderin deposition, consistent with petechial hemorrhage. No acute hemorrhage, mass, mass effect, or midline shift. No hydrocephalus or extra-axial collection. Confluent T2 hyperintense signal in the periventricular white matter, likely the sequela of severe chronic small vessel ischemic disease. Lacunar infarcts in the left pons, right thalamus, and bilateral basal ganglia. Vascular: Please see MRA below. Skull and upper cervical spine: Normal marrow signal. Degenerative changes at C3-C4, with mild anterolisthesis, and at C4-C5, likely causing moderate spinal canal stenosis. Sinuses/Orbits: No acute or significant finding. Status post bilateral lens replacements. Other: None. MRA HEAD FINDINGS Anterior circulation: Both internal carotid arteries are patent to the termini, without significant stenosis. Aplastic or stenosed right A1. Normal left A1. Normal anterior communicating artery. Anterior cerebral arteries  are patent to their distal aspects. No M1 stenosis or occlusion. Normal MCA bifurcations. Distal MCA branches perfused and symmetric. Posterior circulation: Imaged portions of the vertebral arteries patent to the vertebrobasilar junction without stenosis. Basilar patent to its distal aspect. Superior cerebellar arteries patent bilaterally. Patent P1 segments. Severe focal stenosis in the proximal left P2 segment (series 5, image 55), which is likely similar to the prior exam, although that exam was limited by motion. Left PCA branches are otherwise perfused to their distal aspects without stenosis. The right P2 segments are patent, although there is poor signal in the proximal right P3 segments, with distal right P3s not visualized. The bilateral posterior communicating arteries are not visualized.  Anatomic variants: None significant IMPRESSION: 1. No acute intracranial process. 2. Severe focal stenosis in the proximal left P2 segment, which is likely similar to the prior exam, although that exam was limited by motion. 3. Poor signal in the proximal right P3 segments, without visualization of the distal right P3s, which are likely significantly stenosed or occluded. 4. Encephalomalacia in the right parietal, occipital, and inferior temporal lobes, consistent with expected evolution of the previously noted right PCA territory infarct. Electronically Signed   By: Merilyn Baba M.D.   On: 12/10/2021 21:50   MR BRAIN WO CONTRAST  Result Date: 12/10/2021 CLINICAL DATA:  Altered mental status EXAM: MRI HEAD WITHOUT CONTRAST MRA HEAD WITHOUT CONTRAST TECHNIQUE: Multiplanar, multi-echo pulse sequences of the brain and surrounding structures were acquired without intravenous contrast. Angiographic images of the Circle of Willis were acquired using MRA technique without intravenous contrast. COMPARISON:  MRI MRA 12/25/2019, correlation is also made with CT head 12/10/2021 and CTA head 12/25/2019 FINDINGS: MRI HEAD FINDINGS Brain: No restricted diffusion to suggest acute or subacute infarct. Encephalomalacia in the right medial occipital lobe, medial parietal lobe, inferomedial temporal lobe, and hippocampus, consistent with evolution of the PCA territory infarcts noted on 12/25/2019. Some of these areas are associated with hemosiderin deposition, consistent with petechial hemorrhage. No acute hemorrhage, mass, mass effect, or midline shift. No hydrocephalus or extra-axial collection. Confluent T2 hyperintense signal in the periventricular white matter, likely the sequela of severe chronic small vessel ischemic disease. Lacunar infarcts in the left pons, right thalamus, and bilateral basal ganglia. Vascular: Please see MRA below. Skull and upper cervical spine: Normal marrow signal. Degenerative changes at C3-C4, with  mild anterolisthesis, and at C4-C5, likely causing moderate spinal canal stenosis. Sinuses/Orbits: No acute or significant finding. Status post bilateral lens replacements. Other: None. MRA HEAD FINDINGS Anterior circulation: Both internal carotid arteries are patent to the termini, without significant stenosis. Aplastic or stenosed right A1. Normal left A1. Normal anterior communicating artery. Anterior cerebral arteries are patent to their distal aspects. No M1 stenosis or occlusion. Normal MCA bifurcations. Distal MCA branches perfused and symmetric. Posterior circulation: Imaged portions of the vertebral arteries patent to the vertebrobasilar junction without stenosis. Basilar patent to its distal aspect. Superior cerebellar arteries patent bilaterally. Patent P1 segments. Severe focal stenosis in the proximal left P2 segment (series 5, image 55), which is likely similar to the prior exam, although that exam was limited by motion. Left PCA branches are otherwise perfused to their distal aspects without stenosis. The right P2 segments are patent, although there is poor signal in the proximal right P3 segments, with distal right P3s not visualized. The bilateral posterior communicating arteries are not visualized. Anatomic variants: None significant IMPRESSION: 1. No acute intracranial process. 2. Severe focal stenosis in the proximal left  P2 segment, which is likely similar to the prior exam, although that exam was limited by motion. 3. Poor signal in the proximal right P3 segments, without visualization of the distal right P3s, which are likely significantly stenosed or occluded. 4. Encephalomalacia in the right parietal, occipital, and inferior temporal lobes, consistent with expected evolution of the previously noted right PCA territory infarct. Electronically Signed   By: Merilyn Baba M.D.   On: 12/10/2021 21:50   CT ABDOMEN PELVIS W CONTRAST  Result Date: 12/10/2021 CLINICAL DATA:  Nausea and vomiting.   Complicated UTI.  Sepsis. EXAM: CT ABDOMEN AND PELVIS WITH CONTRAST TECHNIQUE: Multidetector CT imaging of the abdomen and pelvis was performed using the standard protocol following bolus administration of intravenous contrast. RADIATION DOSE REDUCTION: This exam was performed according to the departmental dose-optimization program which includes automated exposure control, adjustment of the mA and/or kV according to patient size and/or use of iterative reconstruction technique. CONTRAST:  52m OMNIPAQUE IOHEXOL 300 MG/ML  SOLN COMPARISON:  Remote CT 07/09/2012 FINDINGS: Lower chest: Trace bilateral pleural thickening and adjacent basilar atelectasis. The heart is enlarged. There are bilateral breast implants. Subcutaneous stranding is noted medial to the left breast implant in the left anterior chest wall, series 3, image 11. No drainable collection or soft tissue gas. Hepatobiliary: 11 mm hypodense lesion in the left hepatic lobe, slightly smaller than on remote prior exam. Focal fatty infiltration adjacent to the falciform ligament. Motion artifact through the gallbladder area and mid lower liver limits assessment. No obvious calcified gallstone. There is no biliary dilatation. Pancreas: Parenchymal atrophy. No ductal dilatation or inflammation. Spleen: Normal in size without focal abnormality. Adrenals/Urinary Tract: Normal adrenal glands. 10 mm nonobstructing stone in the upper right kidney. Slight prominence of the right renal pelvis but no hydronephrosis. There may be slight right ureteral thickening and enhancement. There is faint right perinephric edema, partially motion obscured. No evidence of focal renal lesion. No renal fluid collection. 9 mm stone in the left renal pelvis but no hydronephrosis or caliceal dilatation. There is also a 6 mm intrarenal calculi in the left upper pole. There may be slight thickening and enhancement of the left renal pelvis and ureter. No renal fluid collection. The urinary  bladder is nondistended, no bladder stone or wall thickening. Stomach/Bowel: Detailed bowel assessment is limited in the absence of enteric contrast and motion artifact. The stomach is decompressed. There is no small bowel obstruction or evidence of inflammation. The appendix is not confidently visualized on the current exam. Decompressed ascending colon. Small volume of stool throughout the remainder of the colon. No colonic inflammation. There is stool distending the rectum. Vascular/Lymphatic: Moderate to advanced aortic atherosclerosis. No aortic aneurysm. Patent portal vein. No abdominopelvic adenopathy. Reproductive: Status post hysterectomy. No adnexal masses. Other: No free air or ascites. Small fat containing umbilical hernia. Musculoskeletal: Scoliosis and diffuse degenerative change in the lumbar spine. Slight superior endplate compression deformity of L2 and L3. The bones are under mineralized. Surgical hardware in the right proximal femur. No evidence of focal bone lesion or acute osseous finding. IMPRESSION: 1. Bilateral nonobstructing renal calculi. There may be slight thickening and enhancement of the renal collecting systems and ureters, suggesting urinary tract infection. No renal fluid collection. 2. Subcutaneous stranding medial to the left breast implant in the left anterior chest wall, may be infectious or inflammatory. No drainable collection or soft tissue gas. Recommend correlation with physical exam. Aortic Atherosclerosis (ICD10-I70.0). Electronically Signed   By: MAurther LoftD.  On: 12/10/2021 18:28   DG Chest Port 1 View  Result Date: 12/10/2021 CLINICAL DATA:  Questionable sepsis.  Altered mental status. EXAM: PORTABLE CHEST 1 VIEW COMPARISON:  04/13/2020 FINDINGS: Borderline cardiomegaly.The cardiomediastinal contours are unchanged with atherosclerosis. The lungs are clear. Pulmonary vasculature is normal. No consolidation, pleural effusion, or pneumothorax. No acute osseous  abnormalities are seen. Calcified breast implants. IMPRESSION: Low lung volumes with borderline cardiomegaly. No acute chest findings. Electronically Signed   By: Keith Rake M.D.   On: 12/10/2021 17:12   EEG adult  Result Date: 12/11/2021 Lora Havens, MD     12/11/2021  2:51 PM Patient Name: Claire West MRN: [id number] Epilepsy Attending: Lora Havens Referring Physician/Provider: Amie Portland, MD Date: 12/11/2021 Duration: 21.55 mins Patient history: 81 year old female with prior stroke presented with altered mental status.  EEG evaluate for seizure. Level of alertness: Awake AEDs during EEG study: None Technical aspects: This EEG study was done with scalp electrodes positioned according to the 10-20 International system of electrode placement. Electrical activity was acquired at a sampling rate of '500Hz'$  and reviewed with a high frequency filter of '70Hz'$  and a low frequency filter of '1Hz'$ . EEG data were recorded continuously and digitally stored. Description: The posterior dominant rhythm consists of 7.5 Hz activity of moderate voltage (25-35 uV) seen predominantly in posterior head regions, symmetric and reactive to eye opening and eye closing.  EEG showed continuous generalized 3 to 7 Hz theta-delta slowing, at times appears sharply contoured with triphasic morphology.  Hyperventilation and photic stimulation were not performed.   ABNORMALITY - Continuous slow, generalized IMPRESSION: This study is suggestive of moderate diffuse encephalopathy, nonspecific etiology. No seizures or definite epileptiform discharges were seen throughout the recording. Priyanka O Yadav   VAS [place] LOWER EXTREMITY VENOUS (DVT)  Result Date: 12/11/2021  Lower Venous DVT Study Patient Name:  Claire West  Date of Exam:   12/11/2021 Medical Rec #: [id number]         Accession #:    [id number] Date of Birth: 1941-08-26         Patient Gender: F Patient Age:   58 years Exam Location:  [facility] Procedure:       VAS [place] LOWER EXTREMITY VENOUS (DVT) Referring Phys: [name] [facility] --------------------------------------------------------------------------------  Indications: D-dimer elevated.  Risk Factors: Immobility. Limitations: Poor ultrasound/tissue interface and shadowing due to overlying arterial calcification and patient limited mobility. Comparison Study: 12-26-2019 Prior bilateral lower extremity venous was negative                   for DVT. Performing Technologist: Darlin Coco RDMS, RVT  Examination Guidelines: A complete evaluation includes B-mode imaging, spectral Doppler, color Doppler, and power Doppler as needed of all accessible portions of each vessel. Bilateral testing is considered an integral part of a complete examination. Limited examinations for reoccurring indications may be performed as noted. The reflux portion of the exam is performed with the patient in reverse Trendelenburg.  +---------+---------------+---------+-----------+----------+-------------------+  RIGHT     Compressibility Phasicity Spontaneity Properties Thrombus Aging       +---------+---------------+---------+-----------+----------+-------------------+  CFV       Full            Yes       Yes                                         +---------+---------------+---------+-----------+----------+-------------------+  SFJ       Full                                                                  +---------+---------------+---------+-----------+----------+-------------------+  FV Prox   Full                                                                  +---------+---------------+---------+-----------+----------+-------------------+  FV Mid    Full                                                                  +---------+---------------+---------+-----------+----------+-------------------+  FV Distal Full                                                                   +---------+---------------+---------+-----------+----------+-------------------+  PFV       Full                                                                  +---------+---------------+---------+-----------+----------+-------------------+  POP       Full            Yes       Yes                                         +---------+---------------+---------+-----------+----------+-------------------+  PTV       Full                                                                  +---------+---------------+---------+-----------+----------+-------------------+  PERO      Full                                             Some segments not  well visualized      +---------+---------------+---------+-----------+----------+-------------------+   +---------+---------------+---------+-----------+----------+-------------------+  LEFT      Compressibility Phasicity Spontaneity Properties Thrombus Aging       +---------+---------------+---------+-----------+----------+-------------------+  CFV       Full            Yes       Yes                                         +---------+---------------+---------+-----------+----------+-------------------+  SFJ       Full                                                                  +---------+---------------+---------+-----------+----------+-------------------+  FV Prox   Partial         Yes       Yes                    Chronic              +---------+---------------+---------+-----------+----------+-------------------+  FV Mid    Partial         Yes       Yes                    Chronic              +---------+---------------+---------+-----------+----------+-------------------+  FV Distal Partial         Yes       Yes                    Chronic              +---------+---------------+---------+-----------+----------+-------------------+  PFV       Full                                                                   +---------+---------------+---------+-----------+----------+-------------------+  POP       Full            Yes       Yes                                         +---------+---------------+---------+-----------+----------+-------------------+  PTV       Full                                                                  +---------+---------------+---------+-----------+----------+-------------------+  PERO      Full  Some segments not                                                                well visualized      +---------+---------------+---------+-----------+----------+-------------------+     Summary: RIGHT: - There is no evidence of deep vein thrombosis in the lower extremity. However, portions of this examination were limited- see technologist comments above.  - No cystic structure found in the popliteal fossa.  LEFT: - Findings consistent with chronic deep vein thrombosis involving the left femoral vein. - No cystic structure found in the popliteal fossa.  *See table(s) above for measurements and observations. Electronically signed by Servando Snare MD on 12/11/2021 at 5:13:45 PM.    Final       Subjective: Patient had  poor night sleep, was sleepy this morning, but she woke up later with mentation back to baseline  Discharge Exam: Vitals:   12/14/21 0905 12/14/21 1303  BP: (!) 161/83 (!) 167/92  Pulse: 74 93  Resp: 18 16  Temp: (!) 97.5 F (36.4 C) (!) 97.4 F (36.3 C)  SpO2: 96% 97%   Vitals:   12/13/21 2309 12/14/21 0442 12/14/21 0905 12/14/21 1303  BP: (!) 151/63 (!) 159/70 (!) 161/83 (!) 167/92  Pulse: 72 77 74 93  Resp: '17 18 18 16  '$ Temp: 98 F (36.7 C) 98.2 F (36.8 C) (!) 97.5 F (36.4 C) (!) 97.4 F (36.3 C)  TempSrc: Oral Oral Axillary Oral  SpO2: 98% 98% 96% 97%  Weight:      Height:        General: Pt is alert, NAD Cardiovascular: RRR, S1/S2 +, no rubs, no gallops Respiratory: CTA bilaterally, no wheezing, no  rhonchi Abdominal: Soft, NT, ND, bowel sounds + Extremities: no edema, no cyanosis    The results of significant diagnostics from this hospitalization (including imaging, microbiology, ancillary and laboratory) are listed below for reference.     Microbiology: Recent Results (from the past 240 hour(s))  Blood Culture (routine x 2)     Status: None (Preliminary result)   Collection Time: 12/10/21  3:49 PM   Specimen: BLOOD  Result Value Ref Range Status   Specimen Description BLOOD LEFT ANTECUBITAL  Final   Special Requests   Final    BOTTLES DRAWN AEROBIC AND ANAEROBIC Blood Culture adequate volume   Culture   Final    NO GROWTH 4 DAYS Performed at [facility], [address] [place], [place] [address]    Report Status PENDING  Incomplete  Blood Culture (routine x 2)     Status: None (Preliminary result)   Collection Time: 12/10/21  3:54 PM   Specimen: BLOOD  Result Value Ref Range Status   Specimen Description BLOOD LEFT ANTECUBITAL  Final   Special Requests   Final    BOTTLES DRAWN AEROBIC AND ANAEROBIC Blood Culture results may not be optimal due to an inadequate volume of blood received in culture bottles   Culture   Final    NO GROWTH 4 DAYS Performed at [facility], [place] [address] [place], [place] [address]    Report Status PENDING  Incomplete  Resp Panel by RT-PCR (Flu A&B, Covid) Nasopharyngeal Swab     Status: None   Collection Time: 12/10/21  4:20 PM   Specimen: Nasopharyngeal Swab; Nasopharyngeal(NP) swabs in vial transport medium  Result Value Ref Range Status   SARS Coronavirus 2 by RT PCR NEGATIVE NEGATIVE Final    Comment: (NOTE) SARS-CoV-2 target nucleic acids are NOT DETECTED.  The SARS-CoV-2 RNA is generally detectable in upper respiratory specimens during the acute phase of infection. The lowest concentration of SARS-CoV-2 viral copies this assay can detect is 138 copies/mL. A negative result does not preclude SARS-Cov-2 infection and  should not be used as the sole basis for treatment or other patient management decisions. A negative result may occur with  improper specimen collection/handling, submission of specimen other than nasopharyngeal swab, presence of viral mutation(s) within the areas targeted by this assay, and inadequate number of viral copies(<138 copies/mL). A negative result must be combined with clinical observations, patient history, and epidemiological information. The expected result is Negative.  Fact Sheet for Patients:  EntrepreneurPulse.com.au  Fact Sheet for Healthcare Providers:  IncredibleEmployment.be  This test is no t yet approved or cleared by the [place] FDA and  has been authorized for detection and/or diagnosis of SARS-CoV-2 by FDA under an Emergency Use Authorization (EUA). This EUA will remain  in effect (meaning this test can be used) for the duration of the COVID-19 declaration under Section 564(b)(1) of the Act, 21 U.S.C.section 360bbb-3(b)(1), unless the authorization is terminated  or revoked sooner.       Influenza A by PCR NEGATIVE NEGATIVE Final   Influenza B by PCR NEGATIVE NEGATIVE Final    Comment: (NOTE) The Xpert Xpress SARS-CoV-2/FLU/RSV plus assay is intended as an aid in the diagnosis of influenza from Nasopharyngeal swab specimens and should not be used as a sole basis for treatment. Nasal washings and aspirates are unacceptable for Xpert Xpress SARS-CoV-2/FLU/RSV testing.  Fact Sheet for Patients: EntrepreneurPulse.com.au  Fact Sheet for Healthcare Providers: IncredibleEmployment.be  This test is not yet approved or cleared by the [place] FDA and has been authorized for detection and/or diagnosis of SARS-CoV-2 by FDA under an Emergency Use Authorization (EUA). This EUA will remain in effect (meaning this test can be used) for the duration of the COVID-19 declaration  under Section 564(b)(1) of the Act, 21 U.S.C. section 360bbb-3(b)(1), unless the authorization is terminated or revoked.  Performed at [facility], [place] [address] [place], [place] [address]   Urine Culture     Status: None   Collection Time: 12/10/21  5:55 PM   Specimen: Urine, Clean Catch  Result Value Ref Range Status   Specimen Description URINE, CLEAN CATCH  Final   Special Requests NONE  Final   Culture   Final    NO GROWTH Performed at [facility], [place] [address], [place], [place] [address]    Report Status 12/13/2021 FINAL  Final  CSF culture w Gram Stain     Status: None   Collection Time: 12/10/21 10:15 PM   Specimen: CSF; Cerebrospinal Fluid  Result Value Ref Range Status   Specimen Description CSF  Final   Special Requests TUBE 2  Final   Gram Stain   Final    CYTOSPIN SMEAR WBC PRESENT, PREDOMINANTLY MONONUCLEAR NO ORGANISMS SEEN    Culture   Final    NO GROWTH Performed at [facility], [place] [address], [place], [place] [address]    Report Status 12/13/2021 FINAL  Final  MRSA Next Gen by PCR, Nasal     Status: None   Collection Time: 12/11/21  8:49 AM   Specimen: Nasal Mucosa; Nasal Swab  Result Value Ref Range Status   MRSA by PCR Next Gen NOT DETECTED NOT DETECTED Final    Comment: (NOTE) The GeneXpert MRSA Assay (FDA approved for NASAL specimens only), is one component of a comprehensive MRSA colonization surveillance program. It is not intended to diagnose MRSA infection nor to guide or monitor treatment for MRSA infections. Test performance is not FDA approved in patients less than 38 years old. Performed at [facility], [place] [address] [place], [place] [address]   SARS CORONAVIRUS 2 (TAT 6-24 HRS) Nasopharyngeal Nasopharyngeal Swab     Status: None   Collection Time: 12/13/21  2:33 PM   Specimen: Nasopharyngeal Swab  Result Value Ref Range Status   SARS Coronavirus 2 NEGATIVE NEGATIVE Final    Comment:  (NOTE) SARS-CoV-2 target nucleic acids are NOT DETECTED.  The SARS-CoV-2 RNA is generally detectable in upper and lower respiratory specimens during the acute phase of infection. Negative results do not preclude SARS-CoV-2 infection, do not rule out co-infections with other pathogens, and should not be used as the sole basis for treatment or other patient management decisions. Negative results must be combined with clinical observations, patient history, and epidemiological information. The expected result is Negative.  Fact Sheet for Patients: SugarRoll.be  Fact Sheet for Healthcare Providers: https://www.woods-mathews.com/  This test is not yet approved or cleared by the [place] FDA and  has been authorized for detection and/or diagnosis of SARS-CoV-2 by FDA under an Emergency Use Authorization (EUA). This EUA will remain  in effect (meaning this test can be used) for the duration of the COVID-19 declaration under Se ction 564(b)(1) of the Act, 21 U.S.C. section 360bbb-3(b)(1), unless the authorization is terminated or revoked sooner.  Performed at [facility], [place] [address] [place], [place] [address]      Labs: BNP (last 3 results) Recent Labs    12/10/21 1500 12/11/21 0728 12/12/21 0117  BNP 506.7* 263.6* 25.0   Basic Metabolic Panel: Recent Labs  Lab 12/10/21 1500 12/10/21 1557 12/11/21 0120 12/11/21 0728 12/12/21 0117 12/13/21 0120  NA 138 139   139 135  --  131* 134*  K 3.8 3.8   3.8 3.5  --  3.3* 3.4*  CL 103 105 103  --  100 104  CO2 20*  --  20*  --  21* 22  GLUCOSE 120* 115* 120*  --  102* 100*  BUN '14 16 14  '$ --  18 12  CREATININE 0.84 0.50 0.61  --  0.73 0.63  CALCIUM 9.3  --  8.3*  --  8.1* 8.1*  MG  --   --   --  1.9 1.9 2.0   Liver Function Tests: Recent Labs  Lab 12/10/21 1500 12/11/21 0120 12/12/21 0117 12/13/21 0120  AST 117* 79* 112* 73*  ALT 82* 63* 81* 69*  ALKPHOS 94 64 87  100  BILITOT 2.3* 2.7* 2.1* 1.0  PROT 7.2 5.9* 5.3* 5.4*  ALBUMIN 3.5 2.7* 2.5* 2.5*   No results for input(s): LIPASE, AMYLASE in the last 168 hours. No results for input(s): AMMONIA in the last 168 hours. CBC: Recent Labs  Lab 12/10/21 1500 12/10/21 1557 12/11/21 0120 12/12/21 0117 12/13/21 0120  WBC 15.5*  --  14.4* 7.7 5.4  NEUTROABS 14.7*  --  13.4* 5.8 2.9  HGB 16.1* 16.0*   15.6* 13.6 11.9* 10.9*  HCT 47.9* 47.0*   46.0 39.7 34.0* 31.8*  MCV 95.2  --  94.3 93.4 94.1  PLT 221  --  164 153 153   Cardiac Enzymes: No results for input(s): CKTOTAL, CKMB, CKMBINDEX, TROPONINI in the last 168 hours. BNP: Invalid input(s): POCBNP CBG: Recent Labs  Lab 12/12/21 1635 12/12/21 2359 12/13/21 0808 12/13/21 1558 12/14/21 0040  GLUCAP 120* 108* 96 125* 107*   D-Dimer No results for input(s): DDIMER in the last 72 hours. Hgb A1c No results for input(s): HGBA1C in the last 72 hours. Lipid Profile No results for input(s): CHOL, HDL, LDLCALC, TRIG, CHOLHDL, LDLDIRECT in the last 72 hours. Thyroid function studies Recent Labs    12/12/21 0117  TSH 4.025   Anemia work up No results for input(s): VITAMINB12, FOLATE, FERRITIN, TIBC, IRON, RETICCTPCT in the last 72 hours. Urinalysis    Component Value Date/Time   COLORURINE AMBER (A) 12/10/2021 1521   APPEARANCEUR CLEAR 12/10/2021 1521   LABSPEC 1.011 12/10/2021 1521   PHURINE 9.0 (H) 12/10/2021 1521   GLUCOSEU NEGATIVE 12/10/2021 1521   HGBUR MODERATE (A) 12/10/2021 1521   BILIRUBINUR NEGATIVE 12/10/2021 1521   KETONESUR NEGATIVE 12/10/2021 1521   PROTEINUR NEGATIVE 12/10/2021 1521   NITRITE NEGATIVE 12/10/2021 1521   LEUKOCYTESUR NEGATIVE 12/10/2021 1521   Sepsis Labs Invalid input(s): PROCALCITONIN,  WBC,  LACTICIDVEN Microbiology Recent Results (from the past 240 hour(s))  Blood Culture (routine x 2)     Status: None (Preliminary result)   Collection Time: 12/10/21  3:49 PM   Specimen: BLOOD  Result Value  Ref Range Status   Specimen Description BLOOD LEFT ANTECUBITAL  Final   Special Requests   Final    BOTTLES DRAWN AEROBIC AND ANAEROBIC Blood Culture adequate volume   Culture   Final    NO GROWTH 4 DAYS Performed at [facility], [place] [address], [place], [place] [address]    Report Status PENDING  Incomplete  Blood Culture (routine x 2)     Status: None (Preliminary result)   Collection Time: 12/10/21  3:54 PM   Specimen: BLOOD  Result Value Ref Range Status   Specimen Description BLOOD LEFT ANTECUBITAL  Final   Special Requests   Final    BOTTLES DRAWN AEROBIC AND ANAEROBIC Blood Culture results may not be optimal due to an inadequate volume of blood received in culture bottles   Culture   Final    NO GROWTH 4 DAYS Performed at [facility], [place] [address], [place], [place] [address]    Report Status PENDING  Incomplete  Resp Panel by RT-PCR (Flu A&B, Covid) Nasopharyngeal Swab     Status: None   Collection Time: 12/10/21  4:20 PM   Specimen: Nasopharyngeal Swab; Nasopharyngeal(NP) swabs in vial transport medium  Result Value Ref Range Status   SARS Coronavirus 2 by RT PCR NEGATIVE NEGATIVE Final    Comment: (NOTE) SARS-CoV-2 target nucleic acids are NOT DETECTED.  The SARS-CoV-2 RNA is generally detectable in upper respiratory specimens during the acute phase of infection. The lowest concentration of SARS-CoV-2 viral copies this assay can detect is 138 copies/mL. A negative result does not preclude SARS-Cov-2 infection and should not be used as the sole basis for treatment or other patient management decisions. A negative result may occur with  improper specimen collection/handling, submission of specimen other than nasopharyngeal swab, presence of viral mutation(s) within the areas targeted by this assay, and inadequate number of viral copies(<138 copies/mL). A negative result must be combined with clinical observations, patient history, and  epidemiological information. The expected result is Negative.  Fact Sheet for Patients:  EntrepreneurPulse.com.au  Fact Sheet for Healthcare Providers:  IncredibleEmployment.be  This test is no t yet approved or cleared by the [place] FDA and  has been authorized for detection and/or diagnosis of SARS-CoV-2 by FDA under an Emergency Use Authorization (EUA). This EUA will remain  in effect (meaning this test can be used) for the duration of the COVID-19 declaration under Section 564(b)(1) of the Act, 21 U.S.C.section 360bbb-3(b)(1), unless the authorization is terminated  or revoked sooner.       Influenza A by PCR NEGATIVE NEGATIVE Final   Influenza B by PCR NEGATIVE NEGATIVE Final    Comment: (NOTE) The Xpert Xpress SARS-CoV-2/FLU/RSV plus assay is intended as an aid in the diagnosis of influenza from Nasopharyngeal swab specimens and should not be used as a sole basis for treatment. Nasal washings and aspirates are unacceptable for Xpert Xpress SARS-CoV-2/FLU/RSV testing.  Fact Sheet for Patients: EntrepreneurPulse.com.au  Fact Sheet for Healthcare Providers: IncredibleEmployment.be  This test is not yet approved or cleared by the [place] FDA and has been authorized for detection and/or diagnosis of SARS-CoV-2 by FDA under an Emergency Use Authorization (EUA). This EUA will remain in effect (meaning this test can be used) for the duration of the COVID-19 declaration under Section 564(b)(1) of the Act, 21 U.S.C. section 360bbb-3(b)(1), unless the authorization is terminated or revoked.  Performed at [facility], [place] [address] [place], [place] [address]   Urine Culture     Status: None   Collection Time: 12/10/21  5:55 PM   Specimen: Urine, Clean Catch  Result Value Ref Range Status   Specimen Description URINE, CLEAN CATCH  Final   Special Requests NONE  Final   Culture    Final    NO GROWTH Performed at [facility], [place] [address] [place], [place] [address]    Report Status 12/13/2021 FINAL  Final  CSF culture w Gram Stain     Status: None   Collection Time: 12/10/21 10:15 PM   Specimen: CSF; Cerebrospinal Fluid  Result Value Ref Range Status   Specimen Description CSF  Final   Special Requests TUBE 2  Final   Gram Stain   Final    CYTOSPIN SMEAR WBC PRESENT, PREDOMINANTLY MONONUCLEAR NO ORGANISMS SEEN    Culture   Final    NO GROWTH Performed at [facility], [place] [address] [place], [place] [address]    Report Status 12/13/2021 FINAL  Final  MRSA Next Gen by PCR, Nasal     Status: None   Collection Time: 12/11/21  8:49 AM   Specimen: Nasal Mucosa; Nasal Swab  Result Value Ref Range Status   MRSA by PCR Next Gen NOT DETECTED NOT DETECTED Final    Comment: (NOTE) The GeneXpert MRSA Assay (FDA approved for NASAL specimens only), is one component of a comprehensive MRSA colonization surveillance program. It is not intended to diagnose MRSA infection nor to guide or monitor treatment for MRSA infections. Test performance is not FDA approved in patients less than 64 years old. Performed at [facility], [place] [address] [place], [place] [address]   SARS CORONAVIRUS 2 (TAT 6-24 HRS) Nasopharyngeal Nasopharyngeal Swab     Status: None   Collection Time: 12/13/21  2:33 PM   Specimen: Nasopharyngeal Swab  Result Value Ref Range Status   SARS Coronavirus 2 NEGATIVE NEGATIVE Final    Comment: (NOTE)  SARS-CoV-2 target nucleic acids are NOT DETECTED.  The SARS-CoV-2 RNA is generally detectable in upper and lower respiratory specimens during the acute phase of infection. Negative results do not preclude SARS-CoV-2 infection, do not rule out co-infections with other pathogens, and should not be used as the sole basis for treatment or other patient management decisions. Negative results must be combined with clinical  observations, patient history, and epidemiological information. The expected result is Negative.  Fact Sheet for Patients: SugarRoll.be  Fact Sheet for Healthcare Providers: https://www.woods-mathews.com/  This test is not yet approved or cleared by the [place] FDA and  has been authorized for detection and/or diagnosis of SARS-CoV-2 by FDA under an Emergency Use Authorization (EUA). This EUA will remain  in effect (meaning this test can be used) for the duration of the COVID-19 declaration under Se ction 564(b)(1) of the Act, 21 U.S.C. section 360bbb-3(b)(1), unless the authorization is terminated or revoked sooner.  Performed at [facility], [place] [address] [place], [place] [address]      Time coordinating discharge: Over 30 minutes  SIGNED:   Phillips Climes, MD  [facility] 12/14/2021, 3:10 PM Pager   If 7PM-7AM, please contact night-coverage [email or web address] Password [email or web address]

## 2021-12-14 NOTE — Discharge Instructions (Addendum)
Follow with Primary MD Sharilyn Sites, MD in 7 days  ? ?Get CBC, CMP,  checked  by Primary MD next visit.  ? ? ?Activity: As tolerated with Full fall precautions use walker/cane & assistance as needed ? ? ?Disposition SNF ? ? ?Diet: Heart Healthy  ? ? ?On your next visit with your primary care physician please Get Medicines reviewed and adjusted. ? ? ?Please request your Prim.MD to go over all Hospital Tests and Procedure/Radiological results at the follow up, please get all Hospital records sent to your Prim MD by signing hospital release before you go home. ? ? ?If you experience worsening of your admission symptoms, develop shortness of breath, life threatening emergency, suicidal or homicidal thoughts you must seek medical attention immediately by calling 911 or calling your MD immediately  if symptoms less severe. ? ?You Must read complete instructions/literature along with all the possible adverse reactions/side effects for all the Medicines you take and that have been prescribed to you. Take any new Medicines after you have completely understood and accpet all the possible adverse reactions/side effects.  ? ?Do not drive, operating heavy machinery, perform activities at heights, swimming or participation in water activities or provide baby sitting services if your were admitted for syncope or siezures until you have seen by Primary MD or a Neurologist and advised to do so again. ? ?Do not drive when taking Pain medications.  ? ? ?Do not take more than prescribed Pain, Sleep and Anxiety Medications ? ?Special Instructions: If you have smoked or chewed Tobacco  in the last 2 yrs please stop smoking, stop any regular Alcohol  and or any Recreational drug use. ? ?Wear Seat belts while driving. ? ? ?Please note ? ?You were cared for by a hospitalist during your hospital stay. If you have any questions about your discharge medications or the care you received while you were in the hospital after you are discharged,  you can call the unit and asked to speak with the hospitalist on call if the hospitalist that took care of you is not available. Once you are discharged, your primary care physician will handle any further medical issues. Please note that NO REFILLS for any discharge medications will be authorized once you are discharged, as it is imperative that you return to your primary care physician (or establish a relationship with a primary care physician if you do not have one) for your aftercare needs so that they can reassess your need for medications and monitor your lab values.  ?

## 2021-12-14 NOTE — NC FL2 (Signed)
?[place] MEDICAID FL2 LEVEL OF CARE SCREENING TOOL  ?  ? ?IDENTIFICATION  ?Patient Name: ?Claire West Birthdate: 10-09-1940 Sex: female Admission Date (Current Location): ?12/10/2021  ?[place] and [place] Number: ? [place] ?  Facility and Address:  ?[facility], [place] [address] [place], [place] [address] ?     Provider Number: ?[id number]  ?Attending Physician Name and Address:  ?Elgergawy, Silver Huguenin, MD ? Relative Name and Phone Number:  ?  ?   ?Current Level of Care: ?Hospital Recommended Level of Care: ?[place] Prior Approval Number: ?  ? ?Date Approved/Denied: ?  PASRR Number: ?[id number] A ? ?Discharge Plan: ?SNF ?  ? ?Current Diagnoses: ?Patient Active Problem List  ? Diagnosis Date Noted  ? Sepsis ([place]) 12/10/2021  ? Palliative care encounter 12/08/2021  ? Urinary tract infection symptoms 12/08/2021  ? Acute blood loss as cause of postoperative anemia 06/29/2020  ? Closed right hip fracture ([place]) 06/27/2020  ? 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 Syncope 03/15/2020  ? History of ischemic right PCA stroke 02/18/2020  ? Acute CVA (cerebrovascular accident) ([place]) 12/25/2019  ? Restlessness and agitation 12/17/2019  ? Vascular dementia with behavior disturbance 12/17/2019  ? History of total right knee replacement 07/27/2019  ? Mild cognitive impairment 06/25/2019  ? Depression 06/25/2019  ? Impingement syndrome of right shoulder region 02/05/2019  ? Overriding toes 12/15/2018  ? Falls frequently 10/28/2018  ? TIA (transient ischemic attack) 08/23/2018  ? Delirium in remission 07/07/2018  ? OA (osteoarthritis) of knee 05/05/2018  ? Fracture of distal end of radius 02/12/2018  ? Pain in right hand 01/27/2018  ? Amnestic MCI (mild cognitive impairment with memory loss) 12/24/2017  ? Acute metabolic encephalopathy 83/41/9622  ? Altered mental status 11/06/2017  ? Arthritis of both knees 10/29/2017  ? Hyponatremia 02/12/2016  ? Hypokalemia  02/12/2016  ? Dehydration 02/12/2016  ? Elevated LFTs 02/12/2016  ? HTN (hypertension) 02/12/2016  ? GERD (gastroesophageal reflux disease) 02/12/2016  ? ? ?Orientation RESPIRATION BLADDER Height & Weight   ?  ?Self ? Normal Incontinent, External catheter Weight: 135 lb (61.2 kg) (estimate based on previous weights) ?Height:  5' 5.98" (167.6 cm)  ?BEHAVIORAL SYMPTOMS/MOOD NEUROLOGICAL BOWEL NUTRITION STATUS  ?    Incontinent Diet (please see discharge summary)  ?AMBULATORY STATUS COMMUNICATION OF NEEDS Skin   ?Extensive Assist Verbally Normal ?  ?  ?  ?    ?     ?     ? ? ?Personal Care Assistance Level of Assistance  ?  Bathing Assistance: Maximum assistance ?Feeding assistance: Independent ?Dressing Assistance: Maximum assistance ?   ? ?Functional Limitations Info  ?Sight, Hearing, Speech Sight Info: Impaired ?Hearing Info: Adequate ?Speech Info: Adequate  ? ? ?SPECIAL CARE FACTORS FREQUENCY  ?PT (By licensed PT), OT (By licensed OT)   ?  ?PT Frequency: 5x per week ?OT Frequency: 5x per week ?  ?  ?  ?   ? ? ?Contractures Contractures Info: Not present  ? ? ?Additional Factors Info  ?Code Status, Allergies Code Status Info: FULL ?Allergies Info: Penicillins,Codeine,Neosporin,Sulfa Antibiotics, adhesive tape ?  ?  ?  ?   ? ?Current Medications (12/14/2021):  This is the current hospital active medication list ?Current Facility-Administered Medications  ?Medication Dose Route Frequency Provider Last Rate Last Admin  ? 0.9 %  sodium chloride infusion   Intravenous PRN Elgergawy, Silver Huguenin, MD 10 mL/hr at 12/14/21 0641 New Bag at 12/14/21  4270  ? acetaminophen (TYLENOL) tablet 650 mg  650 mg Oral Q6H PRN Rise Patience, MD   650 mg at 12/13/21 2225  ? Or  ? acetaminophen (TYLENOL) suppository 650 mg  650 mg Rectal Q6H PRN Rise Patience, MD      ? aspirin EC tablet 81 mg  81 mg Oral q morning Thurnell Lose, MD   81 mg at 12/14/21 1213  ? cefTRIAXone (ROCEPHIN) 2 g in sodium chloride 0.9 % 100 mL IVPB  2 g  Intravenous Q24H Donnamae Jude, RPH 200 mL/hr at 12/14/21 0647 2 g at 12/14/21 6237  ? diphenhydrAMINE-zinc acetate (BENADRYL) 2-0.1 % cream   Topical BID PRN Elgergawy, Silver Huguenin, MD   Given at 12/13/21 1806  ? heparin injection 5,000 Units  5,000 Units Subcutaneous Q8H Thurnell Lose, MD   5,000 Units at 12/14/21 1447  ? hydrALAZINE (APRESOLINE) injection 10 mg  10 mg Intravenous Q6H PRN Thurnell Lose, MD      ? labetalol (NORMODYNE) injection 10 mg  10 mg Intravenous Q2H PRN Rise Patience, MD      ? metoprolol succinate (TOPROL-XL) 24 hr tablet 50 mg  50 mg Oral BID Thurnell Lose, MD   50 mg at 12/14/21 1213  ? pantoprazole (PROTONIX) injection 40 mg  40 mg Intravenous Q24H Thurnell Lose, MD   40 mg at 12/14/21 1212  ? pravastatin (PRAVACHOL) tablet 40 mg  40 mg Oral QHS Thurnell Lose, MD   40 mg at 12/13/21 2224  ? ? ? ?Discharge Medications: ?Please see discharge summary for a list of discharge medications. ? ?Relevant Imaging Results: ? ?Relevant Lab Results: ? ? ?Additional Information ?SSN [id number] ? ?Vinie Sill, LCSW ? ? ? ? ?

## 2021-12-14 NOTE — Progress Notes (Signed)
Rn called report to [employer]. Spoke with Bria, RN and gave full report regarding discharge instructions. Discussed medication administration. Answered all questions. Transport has been called. Will remove IV. ?Claire West  ?

## 2021-12-14 NOTE — TOC Transition Note (Signed)
Transition of Care (TOC) - CM/SW Discharge Note ? ? ?Patient Details  ?Name: Claire West ?MRN: [id number] ?Date of Birth: December 05, 1940 ? ?Transition of Care (TOC) CM/SW Contact:  ?Vinie Sill, LCSW ?Phone Number: ?12/14/2021, 2:58 PM ? ? ?Clinical Narrative:    ? ?Patient will Discharge to: [facility] ?Discharge Date: 12/14/2021 ?Family Notified: son ?Transport By: Corey Harold ? ?Per MD patient is ready for discharge. RN, patient, and facility notified of discharge. Discharge Summary sent to facility. RN given number for [phone]. Ambulance transport requested for patient.  ? ?Clinical Social Worker signing off. ? ?Thurmond Butts, MSW, LCSW ?Clinical Social Worker ? ? ? ? ?Final next level of care: [place] ?Barriers to Discharge: Barriers Resolved ? ? ?Patient Goals and CMS Choice ?  ?  ?  ? ?Discharge Placement ?  ?           ?Patient [facility] bed at: [facility] ?Patient to be transferred to facility by: [facility] ?Name of family member notified: son ?Patient and family notified of of transfer: 12/14/21 ? ?Discharge Plan and Services ?In-house Referral: Clinical Social Work ?  ?           ?  ?  ?  ?  ?  ?  ?  ?  ?  ?  ? ?Social Determinants of Health (SDOH) Interventions ?  ? ? ?Readmission Risk Interventions ?No flowsheet data found. ? ? ? ? ?

## 2021-12-15 DIAGNOSIS — Z9981 Dependence on supplemental oxygen: Secondary | ICD-10-CM | POA: Diagnosis not present

## 2021-12-15 DIAGNOSIS — G3183 Dementia with Lewy bodies: Secondary | ICD-10-CM | POA: Diagnosis not present

## 2021-12-15 DIAGNOSIS — R791 Abnormal coagulation profile: Secondary | ICD-10-CM | POA: Diagnosis not present

## 2021-12-15 DIAGNOSIS — A4151 Sepsis due to Escherichia coli [E. coli]: Secondary | ICD-10-CM | POA: Diagnosis not present

## 2021-12-15 DIAGNOSIS — F0150 Vascular dementia without behavioral disturbance: Secondary | ICD-10-CM | POA: Diagnosis not present

## 2021-12-15 DIAGNOSIS — Z9181 History of falling: Secondary | ICD-10-CM | POA: Diagnosis not present

## 2021-12-15 DIAGNOSIS — J302 Other seasonal allergic rhinitis: Secondary | ICD-10-CM | POA: Diagnosis not present

## 2021-12-15 DIAGNOSIS — G8929 Other chronic pain: Secondary | ICD-10-CM | POA: Diagnosis not present

## 2021-12-15 DIAGNOSIS — E559 Vitamin D deficiency, unspecified: Secondary | ICD-10-CM | POA: Diagnosis not present

## 2021-12-15 DIAGNOSIS — Z741 Need for assistance with personal care: Secondary | ICD-10-CM | POA: Diagnosis not present

## 2021-12-15 DIAGNOSIS — R41841 Cognitive communication deficit: Secondary | ICD-10-CM | POA: Diagnosis not present

## 2021-12-15 DIAGNOSIS — K5900 Constipation, unspecified: Secondary | ICD-10-CM | POA: Diagnosis not present

## 2021-12-15 DIAGNOSIS — R7401 Elevation of levels of liver transaminase levels: Secondary | ICD-10-CM | POA: Diagnosis not present

## 2021-12-15 DIAGNOSIS — R293 Abnormal posture: Secondary | ICD-10-CM | POA: Diagnosis not present

## 2021-12-15 DIAGNOSIS — R5381 Other malaise: Secondary | ICD-10-CM | POA: Diagnosis not present

## 2021-12-15 DIAGNOSIS — E785 Hyperlipidemia, unspecified: Secondary | ICD-10-CM | POA: Diagnosis not present

## 2021-12-15 DIAGNOSIS — K219 Gastro-esophageal reflux disease without esophagitis: Secondary | ICD-10-CM | POA: Diagnosis not present

## 2021-12-15 DIAGNOSIS — N390 Urinary tract infection, site not specified: Secondary | ICD-10-CM | POA: Diagnosis not present

## 2021-12-15 DIAGNOSIS — Z8673 Personal history of transient ischemic attack (TIA), and cerebral infarction without residual deficits: Secondary | ICD-10-CM | POA: Diagnosis not present

## 2021-12-15 DIAGNOSIS — F0280 Dementia in other diseases classified elsewhere without behavioral disturbance: Secondary | ICD-10-CM | POA: Diagnosis not present

## 2021-12-15 DIAGNOSIS — L0390 Cellulitis, unspecified: Secondary | ICD-10-CM | POA: Diagnosis not present

## 2021-12-15 DIAGNOSIS — I10 Essential (primary) hypertension: Secondary | ICD-10-CM | POA: Diagnosis not present

## 2021-12-15 DIAGNOSIS — M179 Osteoarthritis of knee, unspecified: Secondary | ICD-10-CM | POA: Diagnosis not present

## 2021-12-15 DIAGNOSIS — M6281 Muscle weakness (generalized): Secondary | ICD-10-CM | POA: Diagnosis not present

## 2021-12-15 DIAGNOSIS — I69354 Hemiplegia and hemiparesis following cerebral infarction affecting left non-dominant side: Secondary | ICD-10-CM | POA: Diagnosis not present

## 2021-12-15 DIAGNOSIS — E876 Hypokalemia: Secondary | ICD-10-CM | POA: Diagnosis not present

## 2021-12-15 DIAGNOSIS — F331 Major depressive disorder, recurrent, moderate: Secondary | ICD-10-CM | POA: Diagnosis not present

## 2021-12-15 DIAGNOSIS — E871 Hypo-osmolality and hyponatremia: Secondary | ICD-10-CM | POA: Diagnosis not present

## 2021-12-15 DIAGNOSIS — F419 Anxiety disorder, unspecified: Secondary | ICD-10-CM | POA: Diagnosis not present

## 2021-12-15 DIAGNOSIS — G9341 Metabolic encephalopathy: Secondary | ICD-10-CM | POA: Diagnosis not present

## 2021-12-15 DIAGNOSIS — R7989 Other specified abnormal findings of blood chemistry: Secondary | ICD-10-CM | POA: Diagnosis not present

## 2021-12-15 LAB — CULTURE, BLOOD (ROUTINE X 2)
Culture: NO GROWTH
Culture: NO GROWTH
Special Requests: ADEQUATE

## 2021-12-18 DIAGNOSIS — L0390 Cellulitis, unspecified: Secondary | ICD-10-CM | POA: Diagnosis not present

## 2021-12-18 DIAGNOSIS — F0150 Vascular dementia without behavioral disturbance: Secondary | ICD-10-CM | POA: Diagnosis not present

## 2021-12-18 DIAGNOSIS — E871 Hypo-osmolality and hyponatremia: Secondary | ICD-10-CM | POA: Diagnosis not present

## 2021-12-18 DIAGNOSIS — N390 Urinary tract infection, site not specified: Secondary | ICD-10-CM | POA: Diagnosis not present

## 2021-12-18 DIAGNOSIS — F0280 Dementia in other diseases classified elsewhere without behavioral disturbance: Secondary | ICD-10-CM | POA: Diagnosis not present

## 2021-12-18 DIAGNOSIS — R7401 Elevation of levels of liver transaminase levels: Secondary | ICD-10-CM | POA: Diagnosis not present

## 2021-12-18 DIAGNOSIS — R7989 Other specified abnormal findings of blood chemistry: Secondary | ICD-10-CM | POA: Diagnosis not present

## 2021-12-21 DIAGNOSIS — F0150 Vascular dementia without behavioral disturbance: Secondary | ICD-10-CM | POA: Diagnosis not present

## 2021-12-21 DIAGNOSIS — E871 Hypo-osmolality and hyponatremia: Secondary | ICD-10-CM | POA: Diagnosis not present

## 2021-12-21 DIAGNOSIS — L0390 Cellulitis, unspecified: Secondary | ICD-10-CM | POA: Diagnosis not present

## 2021-12-21 DIAGNOSIS — I10 Essential (primary) hypertension: Secondary | ICD-10-CM | POA: Diagnosis not present

## 2021-12-21 DIAGNOSIS — N390 Urinary tract infection, site not specified: Secondary | ICD-10-CM | POA: Diagnosis not present

## 2021-12-21 DIAGNOSIS — R7401 Elevation of levels of liver transaminase levels: Secondary | ICD-10-CM | POA: Diagnosis not present

## 2021-12-25 DIAGNOSIS — E871 Hypo-osmolality and hyponatremia: Secondary | ICD-10-CM | POA: Diagnosis not present

## 2021-12-25 DIAGNOSIS — E785 Hyperlipidemia, unspecified: Secondary | ICD-10-CM | POA: Diagnosis not present

## 2021-12-25 DIAGNOSIS — G8929 Other chronic pain: Secondary | ICD-10-CM | POA: Diagnosis not present

## 2021-12-25 DIAGNOSIS — G3183 Dementia with Lewy bodies: Secondary | ICD-10-CM | POA: Diagnosis not present

## 2021-12-25 DIAGNOSIS — I10 Essential (primary) hypertension: Secondary | ICD-10-CM | POA: Diagnosis not present

## 2021-12-25 DIAGNOSIS — R5381 Other malaise: Secondary | ICD-10-CM | POA: Diagnosis not present

## 2021-12-25 DIAGNOSIS — R7401 Elevation of levels of liver transaminase levels: Secondary | ICD-10-CM | POA: Diagnosis not present

## 2021-12-25 DIAGNOSIS — E559 Vitamin D deficiency, unspecified: Secondary | ICD-10-CM | POA: Diagnosis not present

## 2021-12-25 DIAGNOSIS — K219 Gastro-esophageal reflux disease without esophagitis: Secondary | ICD-10-CM | POA: Diagnosis not present

## 2021-12-25 DIAGNOSIS — E876 Hypokalemia: Secondary | ICD-10-CM | POA: Diagnosis not present

## 2022-01-11 DIAGNOSIS — I10 Essential (primary) hypertension: Secondary | ICD-10-CM | POA: Diagnosis not present

## 2022-01-11 DIAGNOSIS — F329 Major depressive disorder, single episode, unspecified: Secondary | ICD-10-CM | POA: Diagnosis not present

## 2022-01-11 DIAGNOSIS — I63531 Cerebral infarction due to unspecified occlusion or stenosis of right posterior cerebral artery: Secondary | ICD-10-CM | POA: Diagnosis not present

## 2022-01-11 DIAGNOSIS — E559 Vitamin D deficiency, unspecified: Secondary | ICD-10-CM | POA: Diagnosis not present

## 2022-01-11 DIAGNOSIS — E785 Hyperlipidemia, unspecified: Secondary | ICD-10-CM | POA: Diagnosis not present

## 2022-01-15 DIAGNOSIS — K219 Gastro-esophageal reflux disease without esophagitis: Secondary | ICD-10-CM | POA: Diagnosis not present

## 2022-01-15 DIAGNOSIS — E785 Hyperlipidemia, unspecified: Secondary | ICD-10-CM | POA: Diagnosis not present

## 2022-01-15 DIAGNOSIS — I10 Essential (primary) hypertension: Secondary | ICD-10-CM | POA: Diagnosis not present

## 2022-01-15 DIAGNOSIS — F419 Anxiety disorder, unspecified: Secondary | ICD-10-CM | POA: Diagnosis not present

## 2022-01-15 DIAGNOSIS — G8929 Other chronic pain: Secondary | ICD-10-CM | POA: Diagnosis not present

## 2022-01-15 DIAGNOSIS — E559 Vitamin D deficiency, unspecified: Secondary | ICD-10-CM | POA: Diagnosis not present

## 2022-01-19 DIAGNOSIS — I7091 Generalized atherosclerosis: Secondary | ICD-10-CM | POA: Diagnosis not present

## 2022-02-08 DIAGNOSIS — F0280 Dementia in other diseases classified elsewhere without behavioral disturbance: Secondary | ICD-10-CM | POA: Diagnosis not present

## 2022-02-08 DIAGNOSIS — Z79899 Other long term (current) drug therapy: Secondary | ICD-10-CM | POA: Diagnosis not present

## 2022-02-08 DIAGNOSIS — F419 Anxiety disorder, unspecified: Secondary | ICD-10-CM | POA: Diagnosis not present

## 2022-02-08 DIAGNOSIS — R5381 Other malaise: Secondary | ICD-10-CM | POA: Diagnosis not present

## 2022-02-08 DIAGNOSIS — G3183 Dementia with Lewy bodies: Secondary | ICD-10-CM | POA: Diagnosis not present

## 2022-02-08 DIAGNOSIS — E119 Type 2 diabetes mellitus without complications: Secondary | ICD-10-CM | POA: Diagnosis not present

## 2022-02-08 DIAGNOSIS — F329 Major depressive disorder, single episode, unspecified: Secondary | ICD-10-CM | POA: Diagnosis not present

## 2022-02-08 DIAGNOSIS — E785 Hyperlipidemia, unspecified: Secondary | ICD-10-CM | POA: Diagnosis not present

## 2022-02-08 DIAGNOSIS — E559 Vitamin D deficiency, unspecified: Secondary | ICD-10-CM | POA: Diagnosis not present

## 2022-02-08 DIAGNOSIS — K219 Gastro-esophageal reflux disease without esophagitis: Secondary | ICD-10-CM | POA: Diagnosis not present

## 2022-02-08 DIAGNOSIS — G8929 Other chronic pain: Secondary | ICD-10-CM | POA: Diagnosis not present

## 2022-02-08 DIAGNOSIS — D649 Anemia, unspecified: Secondary | ICD-10-CM | POA: Diagnosis not present

## 2022-02-09 DIAGNOSIS — F329 Major depressive disorder, single episode, unspecified: Secondary | ICD-10-CM | POA: Diagnosis not present

## 2022-02-09 DIAGNOSIS — E785 Hyperlipidemia, unspecified: Secondary | ICD-10-CM | POA: Diagnosis not present

## 2022-02-09 DIAGNOSIS — I63531 Cerebral infarction due to unspecified occlusion or stenosis of right posterior cerebral artery: Secondary | ICD-10-CM | POA: Diagnosis not present

## 2022-02-09 DIAGNOSIS — E559 Vitamin D deficiency, unspecified: Secondary | ICD-10-CM | POA: Diagnosis not present

## 2022-02-09 DIAGNOSIS — I10 Essential (primary) hypertension: Secondary | ICD-10-CM | POA: Diagnosis not present

## 2022-02-19 DIAGNOSIS — I10 Essential (primary) hypertension: Secondary | ICD-10-CM | POA: Diagnosis not present

## 2022-02-19 DIAGNOSIS — K219 Gastro-esophageal reflux disease without esophagitis: Secondary | ICD-10-CM | POA: Diagnosis not present

## 2022-02-19 DIAGNOSIS — E785 Hyperlipidemia, unspecified: Secondary | ICD-10-CM | POA: Diagnosis not present

## 2022-02-19 DIAGNOSIS — E559 Vitamin D deficiency, unspecified: Secondary | ICD-10-CM | POA: Diagnosis not present

## 2022-02-19 DIAGNOSIS — R5381 Other malaise: Secondary | ICD-10-CM | POA: Diagnosis not present

## 2022-02-19 DIAGNOSIS — G8929 Other chronic pain: Secondary | ICD-10-CM | POA: Diagnosis not present

## 2022-03-07 DIAGNOSIS — K219 Gastro-esophageal reflux disease without esophagitis: Secondary | ICD-10-CM | POA: Diagnosis not present

## 2022-03-07 DIAGNOSIS — E559 Vitamin D deficiency, unspecified: Secondary | ICD-10-CM | POA: Diagnosis not present

## 2022-03-07 DIAGNOSIS — L600 Ingrowing nail: Secondary | ICD-10-CM | POA: Diagnosis not present

## 2022-03-07 DIAGNOSIS — G8929 Other chronic pain: Secondary | ICD-10-CM | POA: Diagnosis not present

## 2022-03-07 DIAGNOSIS — R5381 Other malaise: Secondary | ICD-10-CM | POA: Diagnosis not present

## 2022-03-07 DIAGNOSIS — E785 Hyperlipidemia, unspecified: Secondary | ICD-10-CM | POA: Diagnosis not present

## 2022-03-08 DIAGNOSIS — R5381 Other malaise: Secondary | ICD-10-CM | POA: Diagnosis not present

## 2022-03-08 DIAGNOSIS — E785 Hyperlipidemia, unspecified: Secondary | ICD-10-CM | POA: Diagnosis not present

## 2022-03-08 DIAGNOSIS — E559 Vitamin D deficiency, unspecified: Secondary | ICD-10-CM | POA: Diagnosis not present

## 2022-03-08 DIAGNOSIS — I10 Essential (primary) hypertension: Secondary | ICD-10-CM | POA: Diagnosis not present

## 2022-03-08 DIAGNOSIS — K219 Gastro-esophageal reflux disease without esophagitis: Secondary | ICD-10-CM | POA: Diagnosis not present

## 2022-03-08 DIAGNOSIS — G8929 Other chronic pain: Secondary | ICD-10-CM | POA: Diagnosis not present

## 2022-03-08 DIAGNOSIS — L600 Ingrowing nail: Secondary | ICD-10-CM | POA: Diagnosis not present

## 2022-03-19 DIAGNOSIS — R600 Localized edema: Secondary | ICD-10-CM | POA: Diagnosis not present

## 2022-03-19 DIAGNOSIS — K219 Gastro-esophageal reflux disease without esophagitis: Secondary | ICD-10-CM | POA: Diagnosis not present

## 2022-03-19 DIAGNOSIS — E559 Vitamin D deficiency, unspecified: Secondary | ICD-10-CM | POA: Diagnosis not present

## 2022-03-19 DIAGNOSIS — E785 Hyperlipidemia, unspecified: Secondary | ICD-10-CM | POA: Diagnosis not present

## 2022-03-19 DIAGNOSIS — F329 Major depressive disorder, single episode, unspecified: Secondary | ICD-10-CM | POA: Diagnosis not present

## 2022-03-19 DIAGNOSIS — G3183 Dementia with Lewy bodies: Secondary | ICD-10-CM | POA: Diagnosis not present

## 2022-03-19 DIAGNOSIS — G8929 Other chronic pain: Secondary | ICD-10-CM | POA: Diagnosis not present

## 2022-03-19 DIAGNOSIS — I10 Essential (primary) hypertension: Secondary | ICD-10-CM | POA: Diagnosis not present

## 2022-03-21 DIAGNOSIS — E785 Hyperlipidemia, unspecified: Secondary | ICD-10-CM | POA: Diagnosis not present

## 2022-03-21 DIAGNOSIS — G3183 Dementia with Lewy bodies: Secondary | ICD-10-CM | POA: Diagnosis not present

## 2022-03-21 DIAGNOSIS — I7091 Generalized atherosclerosis: Secondary | ICD-10-CM | POA: Diagnosis not present

## 2022-03-21 DIAGNOSIS — R600 Localized edema: Secondary | ICD-10-CM | POA: Diagnosis not present

## 2022-03-21 DIAGNOSIS — E559 Vitamin D deficiency, unspecified: Secondary | ICD-10-CM | POA: Diagnosis not present

## 2022-03-21 DIAGNOSIS — G8929 Other chronic pain: Secondary | ICD-10-CM | POA: Diagnosis not present

## 2022-03-21 DIAGNOSIS — I10 Essential (primary) hypertension: Secondary | ICD-10-CM | POA: Diagnosis not present

## 2022-03-21 DIAGNOSIS — K219 Gastro-esophageal reflux disease without esophagitis: Secondary | ICD-10-CM | POA: Diagnosis not present

## 2022-03-28 DIAGNOSIS — F329 Major depressive disorder, single episode, unspecified: Secondary | ICD-10-CM | POA: Diagnosis not present

## 2022-03-28 DIAGNOSIS — I10 Essential (primary) hypertension: Secondary | ICD-10-CM | POA: Diagnosis not present

## 2022-03-28 DIAGNOSIS — E559 Vitamin D deficiency, unspecified: Secondary | ICD-10-CM | POA: Diagnosis not present

## 2022-03-28 DIAGNOSIS — E782 Mixed hyperlipidemia: Secondary | ICD-10-CM | POA: Diagnosis not present

## 2022-04-03 DIAGNOSIS — I10 Essential (primary) hypertension: Secondary | ICD-10-CM | POA: Diagnosis not present

## 2022-04-04 DIAGNOSIS — E119 Type 2 diabetes mellitus without complications: Secondary | ICD-10-CM | POA: Diagnosis not present

## 2022-04-05 DIAGNOSIS — K219 Gastro-esophageal reflux disease without esophagitis: Secondary | ICD-10-CM | POA: Diagnosis not present

## 2022-04-05 DIAGNOSIS — I10 Essential (primary) hypertension: Secondary | ICD-10-CM | POA: Diagnosis not present

## 2022-04-05 DIAGNOSIS — G8929 Other chronic pain: Secondary | ICD-10-CM | POA: Diagnosis not present

## 2022-04-05 DIAGNOSIS — E785 Hyperlipidemia, unspecified: Secondary | ICD-10-CM | POA: Diagnosis not present

## 2022-04-05 DIAGNOSIS — G3183 Dementia with Lewy bodies: Secondary | ICD-10-CM | POA: Diagnosis not present

## 2022-04-05 DIAGNOSIS — R600 Localized edema: Secondary | ICD-10-CM | POA: Diagnosis not present

## 2022-04-05 DIAGNOSIS — E559 Vitamin D deficiency, unspecified: Secondary | ICD-10-CM | POA: Diagnosis not present

## 2022-04-05 DIAGNOSIS — F419 Anxiety disorder, unspecified: Secondary | ICD-10-CM | POA: Diagnosis not present

## 2022-04-09 DIAGNOSIS — E559 Vitamin D deficiency, unspecified: Secondary | ICD-10-CM | POA: Diagnosis not present

## 2022-04-09 DIAGNOSIS — I10 Essential (primary) hypertension: Secondary | ICD-10-CM | POA: Diagnosis not present

## 2022-04-09 DIAGNOSIS — K219 Gastro-esophageal reflux disease without esophagitis: Secondary | ICD-10-CM | POA: Diagnosis not present

## 2022-04-09 DIAGNOSIS — G8929 Other chronic pain: Secondary | ICD-10-CM | POA: Diagnosis not present

## 2022-04-09 DIAGNOSIS — E785 Hyperlipidemia, unspecified: Secondary | ICD-10-CM | POA: Diagnosis not present

## 2022-04-09 DIAGNOSIS — R600 Localized edema: Secondary | ICD-10-CM | POA: Diagnosis not present

## 2022-04-10 DIAGNOSIS — E119 Type 2 diabetes mellitus without complications: Secondary | ICD-10-CM | POA: Diagnosis not present

## 2022-04-11 DIAGNOSIS — I10 Essential (primary) hypertension: Secondary | ICD-10-CM | POA: Diagnosis not present

## 2022-04-11 DIAGNOSIS — E782 Mixed hyperlipidemia: Secondary | ICD-10-CM | POA: Diagnosis not present

## 2022-04-11 DIAGNOSIS — E559 Vitamin D deficiency, unspecified: Secondary | ICD-10-CM | POA: Diagnosis not present

## 2022-04-11 DIAGNOSIS — F0150 Vascular dementia without behavioral disturbance: Secondary | ICD-10-CM | POA: Diagnosis not present

## 2022-04-11 DIAGNOSIS — F329 Major depressive disorder, single episode, unspecified: Secondary | ICD-10-CM | POA: Diagnosis not present

## 2022-04-16 DIAGNOSIS — K219 Gastro-esophageal reflux disease without esophagitis: Secondary | ICD-10-CM | POA: Diagnosis not present

## 2022-04-16 DIAGNOSIS — F329 Major depressive disorder, single episode, unspecified: Secondary | ICD-10-CM | POA: Diagnosis not present

## 2022-04-16 DIAGNOSIS — I10 Essential (primary) hypertension: Secondary | ICD-10-CM | POA: Diagnosis not present

## 2022-04-16 DIAGNOSIS — R600 Localized edema: Secondary | ICD-10-CM | POA: Diagnosis not present

## 2022-04-16 DIAGNOSIS — E785 Hyperlipidemia, unspecified: Secondary | ICD-10-CM | POA: Diagnosis not present

## 2022-04-16 DIAGNOSIS — F0150 Vascular dementia without behavioral disturbance: Secondary | ICD-10-CM | POA: Diagnosis not present

## 2022-04-16 DIAGNOSIS — E559 Vitamin D deficiency, unspecified: Secondary | ICD-10-CM | POA: Diagnosis not present

## 2022-05-17 DIAGNOSIS — E785 Hyperlipidemia, unspecified: Secondary | ICD-10-CM | POA: Diagnosis not present

## 2022-05-17 DIAGNOSIS — I10 Essential (primary) hypertension: Secondary | ICD-10-CM | POA: Diagnosis not present

## 2022-05-17 DIAGNOSIS — G8929 Other chronic pain: Secondary | ICD-10-CM | POA: Diagnosis not present

## 2022-05-17 DIAGNOSIS — E559 Vitamin D deficiency, unspecified: Secondary | ICD-10-CM | POA: Diagnosis not present

## 2022-05-17 DIAGNOSIS — K219 Gastro-esophageal reflux disease without esophagitis: Secondary | ICD-10-CM | POA: Diagnosis not present

## 2022-05-17 DIAGNOSIS — R600 Localized edema: Secondary | ICD-10-CM | POA: Diagnosis not present

## 2022-05-17 DIAGNOSIS — F329 Major depressive disorder, single episode, unspecified: Secondary | ICD-10-CM | POA: Diagnosis not present

## 2022-05-21 DIAGNOSIS — F419 Anxiety disorder, unspecified: Secondary | ICD-10-CM | POA: Diagnosis not present

## 2022-05-21 DIAGNOSIS — R600 Localized edema: Secondary | ICD-10-CM | POA: Diagnosis not present

## 2022-05-21 DIAGNOSIS — E785 Hyperlipidemia, unspecified: Secondary | ICD-10-CM | POA: Diagnosis not present

## 2022-05-21 DIAGNOSIS — I10 Essential (primary) hypertension: Secondary | ICD-10-CM | POA: Diagnosis not present

## 2022-05-21 DIAGNOSIS — E559 Vitamin D deficiency, unspecified: Secondary | ICD-10-CM | POA: Diagnosis not present

## 2022-05-21 DIAGNOSIS — K219 Gastro-esophageal reflux disease without esophagitis: Secondary | ICD-10-CM | POA: Diagnosis not present

## 2022-05-21 DIAGNOSIS — G8929 Other chronic pain: Secondary | ICD-10-CM | POA: Diagnosis not present

## 2022-05-21 DIAGNOSIS — F329 Major depressive disorder, single episode, unspecified: Secondary | ICD-10-CM | POA: Diagnosis not present

## 2022-05-29 DIAGNOSIS — I7091 Generalized atherosclerosis: Secondary | ICD-10-CM | POA: Diagnosis not present

## 2022-05-29 DIAGNOSIS — B351 Tinea unguium: Secondary | ICD-10-CM | POA: Diagnosis not present

## 2022-05-31 DIAGNOSIS — E782 Mixed hyperlipidemia: Secondary | ICD-10-CM | POA: Diagnosis not present

## 2022-05-31 DIAGNOSIS — F329 Major depressive disorder, single episode, unspecified: Secondary | ICD-10-CM | POA: Diagnosis not present

## 2022-05-31 DIAGNOSIS — E559 Vitamin D deficiency, unspecified: Secondary | ICD-10-CM | POA: Diagnosis not present

## 2022-05-31 DIAGNOSIS — I10 Essential (primary) hypertension: Secondary | ICD-10-CM | POA: Diagnosis not present

## 2022-06-21 DIAGNOSIS — F0150 Vascular dementia without behavioral disturbance: Secondary | ICD-10-CM | POA: Diagnosis not present

## 2022-06-21 DIAGNOSIS — F329 Major depressive disorder, single episode, unspecified: Secondary | ICD-10-CM | POA: Diagnosis not present

## 2022-06-21 DIAGNOSIS — E782 Mixed hyperlipidemia: Secondary | ICD-10-CM | POA: Diagnosis not present

## 2022-06-21 DIAGNOSIS — I10 Essential (primary) hypertension: Secondary | ICD-10-CM | POA: Diagnosis not present

## 2022-06-21 DIAGNOSIS — E559 Vitamin D deficiency, unspecified: Secondary | ICD-10-CM | POA: Diagnosis not present

## 2022-06-21 DIAGNOSIS — E785 Hyperlipidemia, unspecified: Secondary | ICD-10-CM | POA: Diagnosis not present

## 2022-06-21 DIAGNOSIS — K219 Gastro-esophageal reflux disease without esophagitis: Secondary | ICD-10-CM | POA: Diagnosis not present

## 2022-06-21 DIAGNOSIS — R600 Localized edema: Secondary | ICD-10-CM | POA: Diagnosis not present

## 2022-06-23 DIAGNOSIS — R059 Cough, unspecified: Secondary | ICD-10-CM | POA: Diagnosis not present

## 2022-07-09 DIAGNOSIS — K219 Gastro-esophageal reflux disease without esophagitis: Secondary | ICD-10-CM | POA: Diagnosis not present

## 2022-07-09 DIAGNOSIS — R600 Localized edema: Secondary | ICD-10-CM | POA: Diagnosis not present

## 2022-07-09 DIAGNOSIS — I10 Essential (primary) hypertension: Secondary | ICD-10-CM | POA: Diagnosis not present

## 2022-07-09 DIAGNOSIS — F0150 Vascular dementia without behavioral disturbance: Secondary | ICD-10-CM | POA: Diagnosis not present

## 2022-07-09 DIAGNOSIS — J029 Acute pharyngitis, unspecified: Secondary | ICD-10-CM | POA: Diagnosis not present

## 2022-07-09 DIAGNOSIS — E559 Vitamin D deficiency, unspecified: Secondary | ICD-10-CM | POA: Diagnosis not present

## 2022-07-09 DIAGNOSIS — E785 Hyperlipidemia, unspecified: Secondary | ICD-10-CM | POA: Diagnosis not present

## 2022-07-17 DIAGNOSIS — I10 Essential (primary) hypertension: Secondary | ICD-10-CM | POA: Diagnosis not present

## 2022-07-17 DIAGNOSIS — E559 Vitamin D deficiency, unspecified: Secondary | ICD-10-CM | POA: Diagnosis not present

## 2022-07-17 DIAGNOSIS — F329 Major depressive disorder, single episode, unspecified: Secondary | ICD-10-CM | POA: Diagnosis not present

## 2022-07-17 DIAGNOSIS — E785 Hyperlipidemia, unspecified: Secondary | ICD-10-CM | POA: Diagnosis not present

## 2022-07-19 DIAGNOSIS — G8929 Other chronic pain: Secondary | ICD-10-CM | POA: Diagnosis not present

## 2022-07-19 DIAGNOSIS — J029 Acute pharyngitis, unspecified: Secondary | ICD-10-CM | POA: Diagnosis not present

## 2022-07-19 DIAGNOSIS — R600 Localized edema: Secondary | ICD-10-CM | POA: Diagnosis not present

## 2022-07-19 DIAGNOSIS — E785 Hyperlipidemia, unspecified: Secondary | ICD-10-CM | POA: Diagnosis not present

## 2022-07-19 DIAGNOSIS — F0150 Vascular dementia without behavioral disturbance: Secondary | ICD-10-CM | POA: Diagnosis not present

## 2022-07-19 DIAGNOSIS — I10 Essential (primary) hypertension: Secondary | ICD-10-CM | POA: Diagnosis not present

## 2022-07-19 DIAGNOSIS — E559 Vitamin D deficiency, unspecified: Secondary | ICD-10-CM | POA: Diagnosis not present

## 2022-07-19 DIAGNOSIS — K219 Gastro-esophageal reflux disease without esophagitis: Secondary | ICD-10-CM | POA: Diagnosis not present

## 2022-08-09 DIAGNOSIS — I7091 Generalized atherosclerosis: Secondary | ICD-10-CM | POA: Diagnosis not present

## 2022-08-09 DIAGNOSIS — B351 Tinea unguium: Secondary | ICD-10-CM | POA: Diagnosis not present

## 2022-08-15 DIAGNOSIS — E785 Hyperlipidemia, unspecified: Secondary | ICD-10-CM | POA: Diagnosis not present

## 2022-08-15 DIAGNOSIS — R600 Localized edema: Secondary | ICD-10-CM | POA: Diagnosis not present

## 2022-08-15 DIAGNOSIS — K219 Gastro-esophageal reflux disease without esophagitis: Secondary | ICD-10-CM | POA: Diagnosis not present

## 2022-08-15 DIAGNOSIS — F329 Major depressive disorder, single episode, unspecified: Secondary | ICD-10-CM | POA: Diagnosis not present

## 2022-08-17 DIAGNOSIS — I10 Essential (primary) hypertension: Secondary | ICD-10-CM | POA: Diagnosis not present

## 2022-08-17 DIAGNOSIS — D5780 Other sickle-cell disorders without crisis: Secondary | ICD-10-CM | POA: Diagnosis not present

## 2022-08-17 DIAGNOSIS — E0590 Thyrotoxicosis, unspecified without thyrotoxic crisis or storm: Secondary | ICD-10-CM | POA: Diagnosis not present

## 2022-08-17 DIAGNOSIS — E559 Vitamin D deficiency, unspecified: Secondary | ICD-10-CM | POA: Diagnosis not present

## 2022-08-17 DIAGNOSIS — R7989 Other specified abnormal findings of blood chemistry: Secondary | ICD-10-CM | POA: Diagnosis not present

## 2022-08-17 DIAGNOSIS — E785 Hyperlipidemia, unspecified: Secondary | ICD-10-CM | POA: Diagnosis not present

## 2022-08-23 DIAGNOSIS — F329 Major depressive disorder, single episode, unspecified: Secondary | ICD-10-CM | POA: Diagnosis not present

## 2022-08-23 DIAGNOSIS — K219 Gastro-esophageal reflux disease without esophagitis: Secondary | ICD-10-CM | POA: Diagnosis not present

## 2022-08-23 DIAGNOSIS — E559 Vitamin D deficiency, unspecified: Secondary | ICD-10-CM | POA: Diagnosis not present

## 2022-08-23 DIAGNOSIS — E785 Hyperlipidemia, unspecified: Secondary | ICD-10-CM | POA: Diagnosis not present

## 2022-08-23 DIAGNOSIS — G8929 Other chronic pain: Secondary | ICD-10-CM | POA: Diagnosis not present

## 2022-08-23 DIAGNOSIS — R600 Localized edema: Secondary | ICD-10-CM | POA: Diagnosis not present

## 2022-09-01 DIAGNOSIS — E785 Hyperlipidemia, unspecified: Secondary | ICD-10-CM | POA: Diagnosis not present

## 2022-09-01 DIAGNOSIS — I10 Essential (primary) hypertension: Secondary | ICD-10-CM | POA: Diagnosis not present

## 2022-09-01 DIAGNOSIS — F329 Major depressive disorder, single episode, unspecified: Secondary | ICD-10-CM | POA: Diagnosis not present

## 2022-09-01 DIAGNOSIS — E559 Vitamin D deficiency, unspecified: Secondary | ICD-10-CM | POA: Diagnosis not present

## 2022-09-07 DIAGNOSIS — R0981 Nasal congestion: Secondary | ICD-10-CM | POA: Diagnosis not present

## 2022-09-07 DIAGNOSIS — G8929 Other chronic pain: Secondary | ICD-10-CM | POA: Diagnosis not present

## 2022-09-07 DIAGNOSIS — E785 Hyperlipidemia, unspecified: Secondary | ICD-10-CM | POA: Diagnosis not present

## 2022-09-07 DIAGNOSIS — K219 Gastro-esophageal reflux disease without esophagitis: Secondary | ICD-10-CM | POA: Diagnosis not present

## 2022-09-07 DIAGNOSIS — F329 Major depressive disorder, single episode, unspecified: Secondary | ICD-10-CM | POA: Diagnosis not present

## 2022-09-07 DIAGNOSIS — E559 Vitamin D deficiency, unspecified: Secondary | ICD-10-CM | POA: Diagnosis not present

## 2022-09-07 DIAGNOSIS — I10 Essential (primary) hypertension: Secondary | ICD-10-CM | POA: Diagnosis not present

## 2022-09-07 DIAGNOSIS — R059 Cough, unspecified: Secondary | ICD-10-CM | POA: Diagnosis not present

## 2022-09-07 DIAGNOSIS — R600 Localized edema: Secondary | ICD-10-CM | POA: Diagnosis not present

## 2022-09-08 DIAGNOSIS — J811 Chronic pulmonary edema: Secondary | ICD-10-CM | POA: Diagnosis not present

## 2022-09-08 DIAGNOSIS — R059 Cough, unspecified: Secondary | ICD-10-CM | POA: Diagnosis not present

## 2022-09-10 DIAGNOSIS — E559 Vitamin D deficiency, unspecified: Secondary | ICD-10-CM | POA: Diagnosis not present

## 2022-09-10 DIAGNOSIS — R600 Localized edema: Secondary | ICD-10-CM | POA: Diagnosis not present

## 2022-09-10 DIAGNOSIS — E785 Hyperlipidemia, unspecified: Secondary | ICD-10-CM | POA: Diagnosis not present

## 2022-09-10 DIAGNOSIS — F419 Anxiety disorder, unspecified: Secondary | ICD-10-CM | POA: Diagnosis not present

## 2022-09-10 DIAGNOSIS — R059 Cough, unspecified: Secondary | ICD-10-CM | POA: Diagnosis not present

## 2022-09-10 DIAGNOSIS — G8929 Other chronic pain: Secondary | ICD-10-CM | POA: Diagnosis not present

## 2022-09-10 DIAGNOSIS — K219 Gastro-esophageal reflux disease without esophagitis: Secondary | ICD-10-CM | POA: Diagnosis not present

## 2022-09-14 DIAGNOSIS — E785 Hyperlipidemia, unspecified: Secondary | ICD-10-CM | POA: Diagnosis not present

## 2022-09-14 DIAGNOSIS — I63531 Cerebral infarction due to unspecified occlusion or stenosis of right posterior cerebral artery: Secondary | ICD-10-CM | POA: Diagnosis not present

## 2022-09-14 DIAGNOSIS — F329 Major depressive disorder, single episode, unspecified: Secondary | ICD-10-CM | POA: Diagnosis not present

## 2022-09-14 DIAGNOSIS — E559 Vitamin D deficiency, unspecified: Secondary | ICD-10-CM | POA: Diagnosis not present

## 2022-09-14 DIAGNOSIS — I10 Essential (primary) hypertension: Secondary | ICD-10-CM | POA: Diagnosis not present

## 2022-09-17 ENCOUNTER — Encounter: Payer: Self-pay | Admitting: Family Medicine

## 2022-09-17 ENCOUNTER — Non-Acute Institutional Stay: Payer: Medicare Other | Admitting: Family Medicine

## 2022-09-17 VITALS — BP 138/66 | HR 62 | Temp 98.8°F | Resp 16

## 2022-09-17 DIAGNOSIS — F0150 Vascular dementia without behavioral disturbance: Secondary | ICD-10-CM

## 2022-09-17 DIAGNOSIS — Z515 Encounter for palliative care: Secondary | ICD-10-CM | POA: Diagnosis not present

## 2022-09-17 DIAGNOSIS — G3183 Dementia with Lewy bodies: Secondary | ICD-10-CM | POA: Diagnosis not present

## 2022-09-17 NOTE — Progress Notes (Unsigned)
[facility] Consult Note Telephone: [phone]  Fax: [phone]    Date of encounter: 09/17/22 3:03 PM PATIENT NAME: Claire West [address] [address]   [phone] (home)  DOB: May 25, 1941 MRN: [id number] PRIMARY CARE PROVIDER:    Sharilyn Sites, MD,  [address] [place] [address] [phone]  REFERRING PROVIDER:   Sharilyn West, [place],  [place] [address] [phone]  RESPONSIBLE PARTY:    Contact Information     Name Relation Home Work Mobile   Claire West Sister [phone]  [phone]   Claire West   [phone]   Claire West   [phone]   Claire West Other [phone]          I met face to face with patient in [facility]. Palliative Care was asked to follow this patient by consultation request of  Claire Sites, MD to address advance care planning and complex medical decision making. This is a follow up visit   ASSESSMENT , SYMPTOM MANAGEMENT AND PLAN / RECOMMENDATIONS:  Mixed Lewy Body and Vascular Dementia Fast 7 score 7c Weight is increasing.   Recommend frequent position change to avoid skin breakdown, place pillows between bony prominences like knees.   2.  Palliative Care Encounter Educate family on general progression Obtain information on expected goals of care   Advance Care Planning/Goals of Care: Goals include to maximize quality of life and symptom management. The value and importance of advance care planning-has a living will/HC POA-Claire West [phone] Identification of a healthcare agent-son and [facility] POA Claire West Review of an advance directive document-HC POA . Decision not to resuscitate or to de-escalate disease focused treatments due to poor prognosis. CODE STATUS:  Full Code    Follow up Palliative Care Visit: Palliative care will continue to follow for complex medical decision  making, advance care planning, and clarification of goals. Return 4 weeks or prn.    This visit was coded based on medical decision making (MDM).  PPS: 30%  HOSPICE ELIGIBILITY/DIAGNOSIS: TBD  Chief Complaint:  Palliative Care is continuing to follow patient for chronic medical management in setting of dementia and to assist with advanced care planning, refining and defining goals of care.   HISTORY OF PRESENT ILLNESS:  Claire West is a 81 y.o. year old female with mixed lewy body and vascular dementia, HTN, TIA/Hx of CVA, GERD, OA, closed right hip fracture, right shoulder impingement, hx of multiple electrolyte abnormalities of sodium/potassium and water, falls frequently, restlessness and agitation, elevated LFTs and sepsis. Pt states "I feel like I don't know where I am."  Unable to get much history from patient.  She has O2 but is not currently wearing it. Facillty staff states she requires assistance for bathing and dressing, tends to follow what is going on outside her door.  Pt is incontinent of bowel and bladder  History obtained from review of EMR, discussion with facility staff and/or Claire West.  I reviewed EMR for available labs, medications, imaging, studies and related documents.  There are no new records since last visit/Records reviewed and summarized above.   ROS Limited due to advanced dementia  Physical Exam: Current and past weights: 165.4 lbs on 06/20/22, 175.7 lbs on 09/10/22 Constitutional: NAD General: frail appearing, overweight ENMT: intact hearing CV: S1S2, RRR, trace BLE edema, legs cool and pale without cyanosis Pulmonary: CTAB, no increased work of breathing, no cough Abdomen: normo-active BS + 4 quadrants, soft and non tender,  no ascites MSK: no sarcopenia, moves all extremities, bedbound Skin: warm and dry, no rashes or wounds on visible skin Neuro:  BLE generalized weakness,  noted cognitive impairment Psych: non-anxious affect, A and O x1.  Does  not track provider's movements with her eyes Hem/lymph/immuno: no widespread bruising   Thank you for the opportunity to participate in the care of Claire West.  The palliative care team will continue to follow. Please call our office at [phone] if we can be of additional assistance.   Claire Conception, FNP -C  COVID-19 PATIENT SCREENING TOOL Asked and negative response unless otherwise noted:   Have you had symptoms of covid, tested positive or been in contact with someone with symptoms/positive test in the past 5-10 days?

## 2022-09-19 ENCOUNTER — Encounter: Payer: Self-pay | Admitting: Family Medicine

## 2022-09-20 DIAGNOSIS — G8929 Other chronic pain: Secondary | ICD-10-CM | POA: Diagnosis not present

## 2022-09-20 DIAGNOSIS — E785 Hyperlipidemia, unspecified: Secondary | ICD-10-CM | POA: Diagnosis not present

## 2022-09-20 DIAGNOSIS — E559 Vitamin D deficiency, unspecified: Secondary | ICD-10-CM | POA: Diagnosis not present

## 2022-09-20 DIAGNOSIS — I10 Essential (primary) hypertension: Secondary | ICD-10-CM | POA: Diagnosis not present

## 2022-09-20 DIAGNOSIS — R600 Localized edema: Secondary | ICD-10-CM | POA: Diagnosis not present

## 2022-09-20 DIAGNOSIS — K219 Gastro-esophageal reflux disease without esophagitis: Secondary | ICD-10-CM | POA: Diagnosis not present

## 2023-01-03 ENCOUNTER — Non-Acute Institutional Stay: Payer: Medicare Other | Admitting: Family Medicine

## 2023-01-03 ENCOUNTER — Encounter: Payer: Self-pay | Admitting: Family Medicine

## 2023-01-03 VITALS — BP 124/78 | HR 77 | Temp 97.0°F | Resp 18

## 2023-01-03 DIAGNOSIS — Z7401 Bed confinement status: Secondary | ICD-10-CM

## 2023-01-03 DIAGNOSIS — F0150 Vascular dementia without behavioral disturbance: Secondary | ICD-10-CM

## 2023-01-03 NOTE — Progress Notes (Signed)
[facility] Consult Note Telephone: [phone]  Fax: [phone]   Date of encounter: 01/03/23 1:58 PM PATIENT NAME: Claire West [place] [address]   [phone] (home)  DOB: Feb 27, 1941 MRN: [id number] PRIMARY CARE PROVIDER:    Sharilyn Sites, MD,  [address] [place] [id number] [phone]  REFERRING PROVIDER:   Sharilyn Sites, [place],  [place] [address] [phone]  Health Care Agent/Health Care Power of Attorney:    Contact Information     Name Relation Home Work Mobile   [place] Sister [phone]  [phone]   [address]   [phone]   Clarnce Flock   [phone]   Gunnar Bulla Other [phone]          I met face to face with patient in [place]. Palliative Care was asked to follow this patient by consultation request of Sharilyn Sites, MD to address advance care planning and complex medical decision making. This is a follow up visit.   CODE STATUS: Full code  ASSESSMENT AND / RECOMMENDATIONS:  PPS: 30%  Mixed Lewy Body and Vascular Dementia Fast 7 Score 7 c Losing small amount of weight currently, would recommend compact high protein, small volume house supplement BID.   Bedbound Agree with Hoyer lift to change pt's position. Recommend pressure reduction device in recliner, float heels and pressure reduction mattress surface    Follow up Palliative Care Visit:  Palliative Care continuing to follow up by monitoring for changes in appetite, weight, functional and cognitive status for chronic disease progression and management in agreement with patient's stated goals of care. Next visit in 3-4 weeks or prn.  This visit was coded based on medical decision making (MDM).  Chief Complaint  Continue Palliative Care management in setting of mixed lewy body and vascular dementia.  HISTORY OF PRESENT  ILLNESS: Claire West is a 82 y.o. year old female with mixed Lewy Body and Vascular Dementia.  Pt seen sleeping sitting up in the recliner.  She arouses briefly but then immediately falls back to sleep and unable to maintain alertness for ROS.  Facility staff indicates that pt is a hoyer lift to get OOB, has been sleeping more recently and eating less.   ACTIVITIES OF DAILY LIVING: CONTINENT OF BLADDER/BOWEL? No BATHING/DRESSING/FEEDING-total care bathing/dressing, intermittent assistance with feeding  MOBILITY:   BEDBOUND/chairbound  APPETITE? Fair  Weight 168 lbs as of 12/12/22, weight 10/12/22 was 174.4 lbs.  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 ([place]) 06/09/2020   Syncope 03/15/2020   History of ischemic right PCA stroke 02/18/2020   Restlessness and agitation 12/17/2019   Vascular dementia with behavior disturbance ([place]) 12/17/2019   History of total right knee replacement 07/27/2019   Mild cognitive impairment 06/25/2019   Depression 06/25/2019   Impingement syndrome of right shoulder region 02/05/2019   Overriding toes 12/15/2018   Falls frequently 10/28/2018   Delirium in remission 07/07/2018   OA (osteoarthritis) of knee 05/05/2018   Pain in right hand 01/27/2018   Amnestic MCI (mild cognitive impairment with memory loss) 12/24/2017   Altered mental status 11/06/2017   Arthritis of both knees 10/29/2017   Hyponatremia 02/12/2016   Hypokalemia 02/12/2016   Dehydration 02/12/2016   Elevated LFTs 02/12/2016   HTN (hypertension) 02/12/2016   GERD (gastroesophageal reflux disease) 02/12/2016   PAST MEDICAL HISTORY:  Active Ambulatory Problems    Diagnosis Date Noted   Hyponatremia 02/12/2016   Hypokalemia 02/12/2016   Dehydration 02/12/2016   Elevated LFTs 02/12/2016   HTN (hypertension) 02/12/2016   GERD (gastroesophageal reflux disease)  02/12/2016   Altered mental status 11/06/2017   Amnestic MCI (mild cognitive impairment with memory loss) 12/24/2017   OA (osteoarthritis) of knee 05/05/2018   Delirium in remission 07/07/2018   Falls frequently 10/28/2018   Mild cognitive impairment 06/25/2019   Depression 06/25/2019   Restlessness and agitation 12/17/2019   Vascular dementia with behavior disturbance ([place]) 12/17/2019   History of ischemic right PCA stroke 02/18/2020   Syncope 03/15/2020   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 ([place]) 06/09/2020   Arthritis of both knees 10/29/2017   History of total right knee replacement 07/27/2019   Impingement syndrome of right shoulder region 02/05/2019   Overriding toes 12/15/2018   Pain in right hand 01/27/2018   Palliative care encounter 12/08/2021   Resolved Ambulatory Problems    Diagnosis Date Noted   Acute metabolic encephalopathy [id number]   TIA (transient ischemic attack) 08/23/2018   Acute CVA (cerebrovascular accident) ([place]) 12/25/2019   Acute blood loss as cause of postoperative anemia 06/29/2020   Closed right hip fracture ([place]) 06/27/2020   Fracture of distal end of radius 02/12/2018   Urinary tract infection symptoms 12/08/2021   Sepsis ([place]) 12/10/2021   Past Medical History:  Diagnosis Date   Arthritis    Gait instability    History of blood transfusion    Hyperlipemia    Hypertension    Knee pain    Metabolic encephalopathy    Mild cognitive impairment with memory loss    Seasonal allergies    Skin cancer    Stroke [facility])    SOCIAL HX:  Social History   Tobacco Use   Smoking status: Never   Smokeless tobacco: Never  Substance Use Topics   Alcohol use: Not Currently    Comment: occ   FAMILY HX:  Family History  Problem Relation Age of Onset   Lung cancer Mother    Lung cancer Sister        Preferred Pharmacy: ALLERGIES:  Allergies  Allergen Reactions   Penicillins Anaphylaxis, Swelling and Rash    Oral  rash/peeling  **Has tolerated Rocephin, Vantin since 2017**     Adhesive [Tape] Itching and Rash    Irritation at site   Codeine Hives and Rash   Neosporin [Neomycin-Bacitracin Zn-Polymyx] Rash   Sulfa Antibiotics Hives and Rash     PERTINENT MEDICATIONS:  Outpatient Encounter Medications as of 01/03/2023  Medication Sig   acetaminophen (TYLENOL) 325 MG tablet Take 2 tablets (650 mg total) by mouth every 6 (six) hours as needed for mild pain (or Fever >/= 101). (Patient taking differently: Take 650 mg by mouth 3 (three) times daily as needed for mild pain, fever or headache (or Fever >/= 101).)   ARTIFICIAL TEAR OP Place 2 drops into both eyes in the morning and at bedtime.   aspirin EC 81 MG tablet Take 81 mg by mouth every morning.    busPIRone (BUSPAR) 5 MG tablet Take 5 mg by mouth 2 (two) times daily.   calcium carbonate (TUMS - DOSED IN MG ELEMENTAL CALCIUM) 500 MG chewable tablet Chew 1 tablet by mouth daily.   cholecalciferol (VITAMIN D) 25 MCG (1000 UNIT) tablet Take 1,000 Units by mouth daily at 12 noon.   D-Mannose POWD Take 2 g by mouth daily. Uti  prevention   escitalopram (LEXAPRO) 20 MG tablet Take 20 mg by mouth at bedtime.   famotidine (PEPCID) 20 MG tablet Take 20 mg by mouth every evening.   fexofenadine (ALLEGRA) 180 MG tablet Take 180 mg by mouth daily as needed for allergies or rhinitis.   furosemide (LASIX) 20 MG tablet Take 40 mg by mouth daily.   hydrALAZINE (APRESOLINE) 50 MG tablet Take 50 mg by mouth 2 (two) times daily.   HYDROcodone-acetaminophen (NORCO/VICODIN) 5-325 MG tablet Take 1 tablet by mouth 2 (two) times daily as needed for moderate pain.   LACTOBACILLUS PO Take 10 Billion Cells by mouth daily.   magnesium oxide (MAG-OX) 400 MG tablet Take 400 mg by mouth at bedtime.   Menthol, Topical Analgesic, (BIOFREEZE) 4 % GEL Apply 1 application  topically 2 (two) times daily. Right hip, knees and or any area of pain   metoprolol succinate (TOPROL-XL) 50 MG  24 hr tablet Take 50 mg by mouth 2 (two) times daily.   Multiple Vitamins-Minerals (CERTAVITE SENIOR PO) Take 1 tablet by mouth daily.   olmesartan (BENICAR) 40 MG tablet Take 40 mg by mouth daily.   Omega-3 Fatty Acids (SUPER OMEGA 3 EPA/DHA) 1000 MG CAPS Take 1 each by mouth daily.   omeprazole (PRILOSEC) 40 MG capsule Take 40 mg by mouth daily.   ondansetron (ZOFRAN-ODT) 4 MG disintegrating tablet Take 4 mg by mouth every 6 (six) hours as needed for nausea or vomiting.   polyethylene glycol (MIRALAX / GLYCOLAX) 17 g packet Take 17 g by mouth every Monday, Wednesday, and Friday.   potassium chloride (MICRO-K) 10 MEQ CR capsule Take 10 mEq by mouth daily.   traZODone (DESYREL) 50 MG tablet Take 1 tablet (50 mg total) by mouth at bedtime as needed for sleep. (Patient taking differently: Take 25 mg by mouth at bedtime as needed for sleep (dose halved 12/31/22).)   [DISCONTINUED] amLODipine (NORVASC) 10 MG tablet Take 10 mg by mouth daily. (Patient not taking: Reported on 09/19/2022)   [DISCONTINUED] rosuvastatin (CRESTOR) 20 MG tablet Take 20 mg by mouth at bedtime.   No facility-administered encounter medications on file as of 01/03/2023.    History obtained from review of EMR, discussion with facility staff/caregiver and/or patient.    I reviewed available labs, medications, imaging, studies and related documents from the EMR.  There were no new records/imaging since last visit.   Physical Exam: GENERAL: NAD LUNGS: CTAB, no increased work of breathing, room air CARDIAC:  S1S2, RRR with no MRG, No edema/cyanosis ABD:  Normo-active BS x 4 quads, soft, non-tender EXTREMITIES: No muscle atrophy NEURO:  Generalized Weakness, noted cognitive impairment PSYCH:  non-anxious affect, Asleep, difficult to maintain arousal  Thank you for the opportunity to participate in the care of Sianni Sonneborn. Please call our main office at [phone] if we can be of additional assistance.    Damaris Hippo  FNP-C  [email or web address]  Phone:  [phone]

## 2023-01-07 DIAGNOSIS — Z7401 Bed confinement status: Secondary | ICD-10-CM | POA: Insufficient documentation

## 2023-02-05 ENCOUNTER — Encounter: Payer: Self-pay | Admitting: Family Medicine

## 2023-02-05 ENCOUNTER — Non-Acute Institutional Stay: Payer: Medicare Other | Admitting: Family Medicine

## 2023-02-05 VITALS — BP 112/78 | HR 92 | Resp 20

## 2023-02-05 DIAGNOSIS — F0150 Vascular dementia without behavioral disturbance: Secondary | ICD-10-CM

## 2023-02-05 DIAGNOSIS — Z7401 Bed confinement status: Secondary | ICD-10-CM

## 2023-02-05 NOTE — Progress Notes (Signed)
Therapist, nutritional Palliative Care Consult Note Telephone: [phone]  Fax: [phone]   Date of encounter: 01/03/23 1:58 PM PATIENT NAME: Claire West [address] [place] [address]   [phone] (home)  DOB: Nov 16, 1940 MRN: [id number] PRIMARY CARE PROVIDER:    Assunta Found, MD,  [address] [place] [address] [phone]  REFERRING PROVIDER:   Assunta Found, MD [address] [place],  [place] [address] [phone]  Health Care Agent/Health Care Power of Attorney:    Contact Information     Name Relation Home Work Mobile   [place] Sister [phone]  [phone]   Cruz Condon   [phone]   Mitzi Davenport   [phone]   Naaman Plummer Other [phone]          I met face to face with patient in [facility]. Palliative Care was asked to follow this patient by consultation request of Assunta Found, MD to address advance care planning and complex medical decision making. This is a follow up visit.   CODE STATUS: Full code  ASSESSMENT AND / RECOMMENDATIONS:  PPS: 30%  Mixed Lewy Body and Vascular Dementia Fast 7 Score 7 c Engage pt in group activities to maintain stimulation   Bedbound No skin breakdown Continue pressure reduction surfaces when pt stationary    Follow up Palliative Care Visit:  Palliative Care continuing to follow up by monitoring for changes in appetite, weight, functional and cognitive status for chronic disease progression and management in agreement with patient's stated goals of care. Next visit in 3-4 weeks or prn.  This visit was coded based on medical decision making (MDM).  Chief Complaint  Palliative Care is continuing to follow pt for chronic medical management in setting of mixed lewy body and vascular dementia.  HISTORY OF PRESENT ILLNESS: Claire West is a 82 y.o. year old female with mixed Lewy Body and Vascular  Dementia.  Pt seen sitting up in the recliner attending a birthday celebration.  Facility staff indicates that pt is a hoyer lift to get OOB. Denies pain, SOB, nausea, vomiting, dysuria or constipation.   ACTIVITIES OF DAILY LIVING: CONTINENT OF BLADDER/BOWEL? No BATHING/DRESSING/FEEDING-total care bathing/dressing, intermittent assistance with feeding  MOBILITY:   BEDBOUND/chairbound  APPETITE? good  Weight 168 lbs as of 12/12/22, weight 10/12/22 was 174.4 lbs.  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 (HCC) 06/09/2020   Syncope 03/15/2020   History of ischemic right PCA stroke 02/18/2020   Restlessness and agitation 12/17/2019   Vascular dementia with behavior disturbance (HCC) 12/17/2019   History of total right knee replacement 07/27/2019   Mild cognitive impairment 06/25/2019   Depression 06/25/2019   Impingement syndrome of right shoulder region 02/05/2019   Overriding toes 12/15/2018   Falls frequently 10/28/2018   Delirium in remission 07/07/2018   OA (osteoarthritis) of knee 05/05/2018   Pain in right hand 01/27/2018   Amnestic MCI (mild cognitive impairment with memory loss) 12/24/2017   Altered mental status 11/06/2017   Arthritis of both knees 10/29/2017   Hyponatremia 02/12/2016   Hypokalemia 02/12/2016   Dehydration 02/12/2016   Elevated LFTs 02/12/2016   HTN (hypertension) 02/12/2016   GERD (gastroesophageal reflux disease) 02/12/2016   PAST MEDICAL HISTORY:  Active Ambulatory Problems    Diagnosis Date Noted   Hyponatremia 02/12/2016   Hypokalemia 02/12/2016   Dehydration 02/12/2016   Elevated LFTs 02/12/2016  HTN (hypertension) 02/12/2016   GERD (gastroesophageal reflux disease) 02/12/2016   Altered mental status 11/06/2017   Amnestic MCI (mild cognitive impairment with memory loss) 12/24/2017   OA (osteoarthritis) of knee  05/05/2018   Delirium in remission 07/07/2018   Falls frequently 10/28/2018   Mild cognitive impairment 06/25/2019   Depression 06/25/2019   Restlessness and agitation 12/17/2019   Vascular dementia with behavior disturbance (HCC) 12/17/2019   History of ischemic right PCA stroke 02/18/2020   Syncope 03/15/2020   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 (HCC) 06/09/2020   Arthritis of both knees 10/29/2017   History of total right knee replacement 07/27/2019   Impingement syndrome of right shoulder region 02/05/2019   Overriding toes 12/15/2018   Pain in right hand 01/27/2018   Palliative care encounter 12/08/2021   Resolved Ambulatory Problems    Diagnosis Date Noted   Acute metabolic encephalopathy 11/07/2017   TIA (transient ischemic attack) 08/23/2018   Acute CVA (cerebrovascular accident) (HCC) 12/25/2019   Acute blood loss as cause of postoperative anemia 06/29/2020   Closed right hip fracture (HCC) 06/27/2020   Fracture of distal end of radius 02/12/2018   Urinary tract infection symptoms 12/08/2021   Sepsis (HCC) 12/10/2021   Past Medical History:  Diagnosis Date   Arthritis    Gait instability    History of blood transfusion    Hyperlipemia    Hypertension    Knee pain    Metabolic encephalopathy    Mild cognitive impairment with memory loss    Seasonal allergies    Skin cancer    Stroke [facility])    SOCIAL HX:  Social History   Tobacco Use   Smoking status: Never   Smokeless tobacco: Never  Substance Use Topics   Alcohol use: Not Currently    Comment: occ   FAMILY HX:  Family History  Problem Relation Age of Onset   Lung cancer Mother    Lung cancer Sister        Preferred Pharmacy: [facility]:  Allergies  Allergen Reactions   Penicillins Anaphylaxis, Swelling and Rash    Oral rash/peeling  **Has tolerated Rocephin, Vantin since 2017**     Adhesive [Tape] Itching and Rash    Irritation at site   Codeine Hives and Rash   Neosporin  [Neomycin-Bacitracin Zn-Polymyx] Rash   Sulfa Antibiotics Hives and Rash     PERTINENT MEDICATIONS:  Outpatient Encounter Medications as of 01/03/2023  Medication Sig   acetaminophen (TYLENOL) 325 MG tablet Take 2 tablets (650 mg total) by mouth every 6 (six) hours as needed for mild pain (or Fever >/= 101). (Patient taking differently: Take 650 mg by mouth 3 (three) times daily as needed for mild pain, fever or headache (or Fever >/= 101).)   ARTIFICIAL TEAR OP Place 2 drops into both eyes in the morning and at bedtime.   aspirin EC 81 MG tablet Take 81 mg by mouth every morning.    busPIRone (BUSPAR) 5 MG tablet Take 5 mg by mouth 2 (two) times daily.   calcium carbonate (TUMS - DOSED IN MG ELEMENTAL CALCIUM) 500 MG chewable tablet Chew 1 tablet by mouth daily.   cholecalciferol (VITAMIN D) 25 MCG (1000 UNIT) tablet Take 1,000 Units by mouth daily at 12 noon.   D-Mannose POWD Take 2 g by mouth daily. Uti prevention   escitalopram (LEXAPRO) 20 MG tablet Take 20 mg by mouth at bedtime.   famotidine (PEPCID) 20 MG tablet Take 20 mg by mouth every  evening.   fexofenadine (ALLEGRA) 180 MG tablet Take 180 mg by mouth daily as needed for allergies or rhinitis.   furosemide (LASIX) 20 MG tablet Take 40 mg by mouth daily.   hydrALAZINE (APRESOLINE) 50 MG tablet Take 50 mg by mouth 2 (two) times daily.   HYDROcodone-acetaminophen (NORCO/VICODIN) 5-325 MG tablet Take 1 tablet by mouth 2 (two) times daily as needed for moderate pain.   LACTOBACILLUS PO Take 10 Billion Cells by mouth daily.   magnesium oxide (MAG-OX) 400 MG tablet Take 400 mg by mouth at bedtime.   Menthol, Topical Analgesic, (BIOFREEZE) 4 % GEL Apply 1 application  topically 2 (two) times daily. Right hip, knees and or any area of pain   metoprolol succinate (TOPROL-XL) 50 MG 24 hr tablet Take 50 mg by mouth 2 (two) times daily.   Multiple Vitamins-Minerals (CERTAVITE SENIOR PO) Take 1 tablet by mouth daily.   olmesartan (BENICAR) 40 MG  tablet Take 40 mg by mouth daily.   Omega-3 Fatty Acids (SUPER OMEGA 3 EPA/DHA) 1000 MG CAPS Take 1 each by mouth daily.   omeprazole (PRILOSEC) 40 MG capsule Take 40 mg by mouth daily.   ondansetron (ZOFRAN-ODT) 4 MG disintegrating tablet Take 4 mg by mouth every 6 (six) hours as needed for nausea or vomiting.   polyethylene glycol (MIRALAX / GLYCOLAX) 17 g packet Take 17 g by mouth every Monday, Wednesday, and Friday.   potassium chloride (MICRO-K) 10 MEQ CR capsule Take 10 mEq by mouth daily.   traZODone (DESYREL) 50 MG tablet Take 1 tablet (50 mg total) by mouth at bedtime as needed for sleep. (Patient taking differently: Take 25 mg by mouth at bedtime as needed for sleep (dose halved 12/31/22).)   [DISCONTINUED] amLODipine (NORVASC) 10 MG tablet Take 10 mg by mouth daily. (Patient not taking: Reported on 09/19/2022)   [DISCONTINUED] rosuvastatin (CRESTOR) 20 MG tablet Take 20 mg by mouth at bedtime.   No facility-administered encounter medications on file as of 01/03/2023.    History obtained from review of EMR, discussion with facility staff/caregiver and/or patient.    I reviewed available labs, medications, imaging, studies and related documents from the EMR.  There were no new records/imaging since last visit.   Physical Exam: GENERAL: NAD LUNGS: CTAB, no increased work of breathing, room air CARDIAC:  S1S2, RRR with no MRG, No edema/cyanosis ABD:  Normo-active BS x 4 quads, soft, non-tender EXTREMITIES: No muscle atrophy NEURO:  Generalized Weakness, noted cognitive impairment PSYCH:  non-anxious affect, Awake, alert and oriented times 2.  Thank you for the opportunity to participate in the care of Karishma Harju. Please call our main office at [phone] if we can be of additional assistance.    Joycelyn Man FNP-C  [email or web address] .[facility]  Phone:  [phone]

## 2023-03-27 ENCOUNTER — Non-Acute Institutional Stay: Payer: Medicare Other | Admitting: Family Medicine

## 2023-03-27 ENCOUNTER — Encounter: Payer: Self-pay | Admitting: Family Medicine

## 2023-03-27 VITALS — BP 110/68 | HR 71 | Temp 97.3°F | Resp 20

## 2023-03-27 DIAGNOSIS — R451 Restlessness and agitation: Secondary | ICD-10-CM

## 2023-03-27 DIAGNOSIS — G3183 Dementia with Lewy bodies: Secondary | ICD-10-CM

## 2023-03-27 NOTE — Progress Notes (Signed)
   Therapist, nutritional Palliative Care Consult Note Telephone: [phone]  Fax: [phone]   Date of encounter: 03/27/23 9:38 AM PATIENT NAME: Claire West [address] [place] [address]   [id number] (home)  DOB: [birth year] MRN: [id number] PRIMARY CARE PROVIDER:    Assunta Found, MD,  [address] [place] [address] [phone]  REFERRING PROVIDER:   Assunta Found, MD [address] [place],  [place] [address] [phone]  Health Care Agent/Health Care Power of Attorney:    Contact Information     Name Relation Home Work Mobile   [place] Sister [phone]  [phone]   Cruz Condon   [phone]   Mitzi Davenport   [phone]   Naaman Plummer Other [phone]         Code status: Full code, has living will   I met face to face with patient in [facility]. Palliative Care was asked to follow this patient by consultation request of Assunta Found, MD to address advance care planning and complex medical decision making. This is a follow up visit.   CODE STATUS: Full code  ASSESSMENT AND / RECOMMENDATIONS:  PPS: 30%  Mixed Lewy Body and Vascular Dementia Fast 7 Score 7 c Small declines noted in speech and alertness. Make sure pt is fully awake and upright for all intake.   Restlessness and agitation Helping pt understand what is asked mirroring expected behaviors  Continuing stimulation of taking her to meals/social events to keep her awake during the day will help her sleep better at night.    Follow up Palliative Care Visit:  Palliative Care continuing to follow up by monitoring for changes in appetite, weight, functional and cognitive status for chronic disease progression and management in agreement with patient's stated goals of care. Next visit in 3-4 weeks or prn.  This visit was coded based on medical decision making (MDM).  Chief Complaint  Palliative  Care is continuing to follow pt for chronic medical management in setting of mixed lewy body and vascular dementia.  HISTORY OF PRESENT ILLNESS: Claire West is a 82 y.o. year old female with mixed Lewy Body and Vascular Dementia.  Pt seen sleeping in bed.  When aroused she does not open eyes.  Endorses pain but cannot specify where, brow/body posture is relaxed and when asked about specific locations she answers yes to having pain at all of them (head, toes, legs).  When asked if her name is Claire West, she says "No, it's Claire West."  Per facility staff pt is transferred to and from chair with a lift.  She continues to have intermittent periods of daytime/nighttime reversal of sleep patterns and is usually not awake in the am.  I reviewed available labs, medications, imaging, studies and related documents from the EMR.  There were no new records/imaging since last visit.   Physical Exam: GENERAL: NAD, sleeping in semi fowlers position LUNGS: CTAB, no increased work of breathing, room air CARDIAC:  S1S2, RRR with no MRG, No edema/cyanosis ABD:  Hypo-active BS x 4 quads, soft, non-tender EXTREMITIES: No muscle atrophy NEURO:  Noted cognitive impairment PSYCH:  non-anxious affect, Asleep, arousable and oriented times 1.  Thank you for the opportunity to participate in the care of Claire West. Please call our main office at [phone] if we can be of additional assistance.    Joycelyn Man FNP-C  [email or web address] .[facility]  Phone:  [phone]

## 2023-08-25 ENCOUNTER — Other Ambulatory Visit: Payer: Self-pay

## 2023-08-25 ENCOUNTER — Encounter (HOSPITAL_COMMUNITY): Payer: Self-pay

## 2023-08-25 ENCOUNTER — Emergency Department (HOSPITAL_COMMUNITY)
Admission: EM | Admit: 2023-08-25 | Discharge: 2023-08-26 | Disposition: A | Discharge status: Home / Self Care | Payer: Medicare Other | Attending: Emergency Medicine | Admitting: Emergency Medicine

## 2023-08-25 ENCOUNTER — Emergency Department (HOSPITAL_COMMUNITY): Payer: Medicare Other

## 2023-08-25 DIAGNOSIS — R7401 Elevation of levels of liver transaminase levels: Secondary | ICD-10-CM | POA: Insufficient documentation

## 2023-08-25 DIAGNOSIS — N23 Unspecified renal colic: Secondary | ICD-10-CM

## 2023-08-25 DIAGNOSIS — N390 Urinary tract infection, site not specified: Secondary | ICD-10-CM | POA: Insufficient documentation

## 2023-08-25 DIAGNOSIS — Z7982 Long term (current) use of aspirin: Secondary | ICD-10-CM | POA: Insufficient documentation

## 2023-08-25 DIAGNOSIS — N132 Hydronephrosis with renal and ureteral calculous obstruction: Secondary | ICD-10-CM | POA: Insufficient documentation

## 2023-08-25 DIAGNOSIS — Z79899 Other long term (current) drug therapy: Secondary | ICD-10-CM | POA: Diagnosis not present

## 2023-08-25 DIAGNOSIS — Z8673 Personal history of transient ischemic attack (TIA), and cerebral infarction without residual deficits: Secondary | ICD-10-CM | POA: Insufficient documentation

## 2023-08-25 DIAGNOSIS — F0390 Unspecified dementia without behavioral disturbance: Secondary | ICD-10-CM | POA: Insufficient documentation

## 2023-08-25 DIAGNOSIS — D72829 Elevated white blood cell count, unspecified: Secondary | ICD-10-CM | POA: Diagnosis not present

## 2023-08-25 DIAGNOSIS — R112 Nausea with vomiting, unspecified: Secondary | ICD-10-CM | POA: Diagnosis present

## 2023-08-25 DIAGNOSIS — I10 Essential (primary) hypertension: Secondary | ICD-10-CM | POA: Insufficient documentation

## 2023-08-25 LAB — COMPREHENSIVE METABOLIC PANEL
ALT: 45 U/L — ABNORMAL HIGH (ref 0–44)
AST: 143 U/L — ABNORMAL HIGH (ref 15–41)
Albumin: 3.3 g/dL — ABNORMAL LOW (ref 3.5–5.0)
Alkaline Phosphatase: 118 U/L (ref 38–126)
Anion gap: 10 (ref 5–15)
BUN: 9 mg/dL (ref 8–23)
CO2: 25 mmol/L (ref 22–32)
Calcium: 9 mg/dL (ref 8.9–10.3)
Chloride: 102 mmol/L (ref 98–111)
Creatinine, Ser: 0.69 mg/dL (ref 0.44–1.00)
GFR, Estimated: >60 mL/min (ref >60)
Glucose, Bld: 131 mg/dL — ABNORMAL HIGH (ref 70–99)
Potassium: 3.5 mmol/L (ref 3.5–5.1)
Sodium: 137 mmol/L (ref 135–145)
Total Bilirubin: 1.9 mg/dL — ABNORMAL HIGH (ref <1.2)
Total Protein: 7.1 g/dL (ref 6.5–8.1)

## 2023-08-25 LAB — CBC
HCT: 36.9 % (ref 36.0–46.0)
Hemoglobin: 12.2 g/dL (ref 12.0–15.0)
MCH: 31.3 pg (ref 26.0–34.0)
MCHC: 33.1 g/dL (ref 30.0–36.0)
MCV: 94.6 fL (ref 80.0–100.0)
Platelets: 274 10*3/uL (ref 150–400)
RBC: 3.9 MIL/uL (ref 3.87–5.11)
RDW: 13.9 % (ref 11.5–15.5)
WBC: 7.4 10*3/uL (ref 4.0–10.5)
nRBC: 0 % (ref 0.0–0.2)

## 2023-08-25 LAB — URINALYSIS, ROUTINE W REFLEX MICROSCOPIC
Bilirubin Urine: NEGATIVE
Glucose, UA: NEGATIVE mg/dL
Ketones, ur: NEGATIVE mg/dL
Nitrite: NEGATIVE
Protein, ur: 100 mg/dL — AB
Specific Gravity, Urine: 1.017 (ref 1.005–1.030)
WBC, UA: >50 WBC/hpf (ref 0–5)
pH: 7 (ref 5.0–8.0)

## 2023-08-25 LAB — LIPASE, BLOOD: Lipase: 21 U/L (ref 11–51)

## 2023-08-25 MED ORDER — CIPROFLOXACIN HCL 500 MG PO TABS
500.0000 mg | ORAL_TABLET | Freq: Once | ORAL | Status: AC
  Administered 2023-08-25: 500 mg via ORAL
  Filled 2023-08-25: qty 1

## 2023-08-25 MED ORDER — IOHEXOL 300 MG/ML  SOLN
100.0000 mL | Freq: Once | INTRAMUSCULAR | Status: AC | PRN
  Administered 2023-08-25: 100 mL via INTRAVENOUS

## 2023-08-25 MED ORDER — CIPROFLOXACIN HCL 500 MG PO TABS: 500.0000 mg | ORAL_TABLET | Freq: Two times a day (BID) | ORAL | 0 refills | Status: DC

## 2023-08-25 NOTE — ED Notes (Signed)
Report given to Cindy, RN.

## 2023-08-25 NOTE — ED Provider Notes (Signed)
[place] EMERGENCY DEPARTMENT AT [facility] Provider Note   CSN: [id number] Arrival date & time: 08/25/23  1701     History  Chief Complaint  Patient presents with   Emesis    Claire West is a 82 y.o. female.  HPI     82 year old female comes in with chief complaint of vomiting. Patient has past medical history of dementia, hypertension and currently resides at nursing home.  Patient has severe dementia and unable to provide meaningful history.  I called patient's nursing home.  I spoke with the nurse, who indicated that during the daytime, patient started having nausea and vomiting.  She had about 5 episodes of emesis and had complained of abdominal discomfort, therefore she was sent to the emergency room.  At baseline patient has confusion because of dementia.  However patient typically does not have vomiting.  There is no known history of recurrent UTI.  Patient has history of stroke and has left-sided weakness.  Home Medications Prior to Admission medications   Medication Sig Start Date End Date Taking? Authorizing Provider  ciprofloxacin (CIPRO) 500 MG tablet Take 1 tablet (500 mg total) by mouth every 12 (twelve) hours. 08/25/23  Yes Derwood Kaplan, MD  acetaminophen (TYLENOL) 325 MG tablet Take 2 tablets (650 mg total) by mouth every 6 (six) hours as needed for mild pain (or Fever >/= 101). Patient taking differently: Take 650 mg by mouth 3 (three) times daily as needed for mild pain, fever or headache (or Fever >/= 101). 12/14/21   Elgergawy, Leana Roe, MD  ARTIFICIAL TEAR OP Place 2 drops into both eyes in the morning and at bedtime.    [provider]  aspirin EC 81 MG tablet Take 81 mg by mouth every morning.     [provider]  busPIRone (BUSPAR) 5 MG tablet Take 5 mg by mouth 2 (two) times daily. 03/12/21   [provider]  calcium carbonate (TUMS - DOSED IN MG ELEMENTAL CALCIUM) 500 MG chewable tablet Chew 1 tablet by mouth  daily.    [provider]  cholecalciferol (VITAMIN D) 25 MCG (1000 UNIT) tablet Take 1,000 Units by mouth daily at 12 noon.    [provider]  D-Mannose POWD Take 2 g by mouth daily. Uti prevention    [provider]  escitalopram (LEXAPRO) 20 MG tablet Take 20 mg by mouth at bedtime. 03/07/18   [provider]  famotidine (PEPCID) 20 MG tablet Take 20 mg by mouth every evening. 11/29/21   [provider]  fexofenadine (ALLEGRA) 180 MG tablet Take 180 mg by mouth daily as needed for allergies or rhinitis.    [provider]  furosemide (LASIX) 20 MG tablet Take 40 mg by mouth daily. 04/16/22   [provider]  hydrALAZINE (APRESOLINE) 50 MG tablet Take 50 mg by mouth 2 (two) times daily. 11/14/22   [provider]  HYDROcodone-acetaminophen (NORCO/VICODIN) 5-325 MG tablet Take 1 tablet by mouth 2 (two) times daily as needed for moderate pain. 12/14/21   Elgergawy, Leana Roe, MD  LACTOBACILLUS PO Take 10 Billion Cells by mouth daily.    [provider]  magnesium oxide (MAG-OX) 400 MG tablet Take 400 mg by mouth at bedtime.    [provider]  Menthol, Topical Analgesic, (BIOFREEZE) 4 % GEL Apply 1 application  topically 2 (two) times daily. Right hip, knees and or any area of pain    [provider]  metoprolol succinate (TOPROL-XL)  50 MG 24 hr tablet Take 50 mg by mouth 2 (two) times daily. 03/12/21   [provider]  Multiple Vitamins-Minerals (CERTAVITE SENIOR PO) Take 1 tablet by mouth daily.    [provider]  olmesartan (BENICAR) 40 MG tablet Take 40 mg by mouth daily. 01/13/21   [provider]  Omega-3 Fatty Acids (SUPER OMEGA 3 EPA/DHA) 1000 MG CAPS Take 1 each by mouth daily.    [provider]  omeprazole (PRILOSEC) 40 MG capsule Take 40 mg by mouth daily.    [provider]  ondansetron (ZOFRAN-ODT) 4 MG disintegrating tablet Take 4 mg by mouth every 6  (six) hours as needed for nausea or vomiting.    [provider]  polyethylene glycol (MIRALAX / GLYCOLAX) 17 g packet Take 17 g by mouth every Monday, Wednesday, and Friday.    [provider]  potassium chloride (MICRO-K) 10 MEQ CR capsule Take 10 mEq by mouth daily. 11/20/21   [provider]  traZODone (DESYREL) 50 MG tablet Take 1 tablet (50 mg total) by mouth at bedtime as needed for sleep. Patient taking differently: Take 25 mg by mouth at bedtime as needed for sleep (dose halved 12/31/22). 12/14/21   Elgergawy, Leana Roe, MD      Allergies    Penicillins, Adhesive [tape], Codeine, Neosporin [neomycin-bacitracin zn-polymyx], and Sulfa antibiotics    Review of Systems   Review of Systems  All other systems reviewed and are negative.   Physical Exam Updated Vital Signs BP (!) 141/73 (BP Location: Right Arm)   Pulse 74   Temp 98.1 F (36.7 C)   Resp 19   Ht 5\' 5"  (1.651 m)   Wt 62 kg   SpO2 100%   BMI 22.75 kg/m  Physical Exam Vitals and nursing note reviewed.  Constitutional:      Appearance: She is well-developed.  HENT:     Head: Normocephalic and atraumatic.  Eyes:     Extraocular Movements: Extraocular movements intact.  Cardiovascular:     Rate and Rhythm: Normal rate.  Pulmonary:     Effort: Pulmonary effort is normal.  Abdominal:     Tenderness: There is no abdominal tenderness.  Musculoskeletal:     Cervical back: Normal range of motion and neck supple.  Skin:    General: Skin is dry.  Neurological:     Mental Status: She is alert and oriented to person, place, and time.     ED Results / Procedures / Treatments   Labs (all labs ordered are listed, but only abnormal results are displayed) Labs Reviewed  COMPREHENSIVE METABOLIC PANEL - Abnormal; Notable for the following components:      Result Value   Glucose, Bld 131 (*)    Albumin 3.3 (*)    AST 143 (*)    ALT 45 (*)    Total Bilirubin 1.9 (*)    All other components  within normal limits  URINALYSIS, ROUTINE W REFLEX MICROSCOPIC - Abnormal; Notable for the following components:   Color, Urine AMBER (*)    APPearance CLOUDY (*)    Hgb urine dipstick MODERATE (*)    Protein, ur 100 (*)    Leukocytes,Ua LARGE (*)    Bacteria, UA FEW (*)    All other components within normal limits  URINE CULTURE  LIPASE, BLOOD  CBC    EKG EKG Interpretation Date/Time:  Sunday August 25 2023 17:17:36 EST Ventricular Rate:  82 PR Interval:  44 QRS Duration:  115  QT Interval:  442 QTC Calculation: 517 R Axis:   -39  Text Interpretation: Sinus rhythm Short PR interval Incomplete left bundle branch block Inferior infarct, old Artifact in lead(s) I III aVR aVL Confirmed by Derwood Kaplan [phone]) on 08/25/2023 7:19:02 PM  Radiology CT ABDOMEN PELVIS W CONTRAST  Result Date: 08/25/2023 CLINICAL DATA:  Abdominal pain, vomiting EXAM: CT ABDOMEN AND PELVIS WITH CONTRAST TECHNIQUE: Multidetector CT imaging of the abdomen and pelvis was performed using the standard protocol following bolus administration of intravenous contrast. RADIATION DOSE REDUCTION: This exam was performed according to the departmental dose-optimization program which includes automated exposure control, adjustment of the mA and/or kV according to patient size and/or use of iterative reconstruction technique. CONTRAST:  OMNIPAQUE IOHEXOL 300 MG/ML  SOLN COMPARISON:  12/10/2021 FINDINGS: Study is limited by respiratory motion throughout the exam. Lower chest: No acute pleural or parenchymal lung disease. Hepatobiliary: Gallbladder is moderately distended with multiple small gallstones layering dependently. No gallbladder wall thickening or pericholecystic fluid. Stable hepatic cyst within the left lobe. Otherwise unremarkable appearance of the liver. Pancreas: Unremarkable. No pancreatic ductal dilatation or surrounding inflammatory changes. Spleen: Normal in size without focal abnormality.  Adrenals/Urinary Tract: There are 2 distinct obstructing left ureteral calculi. A 4 mm distal left ureteral calculus is seen on image 73/3, and a 12 mm obstructing calculus is seen at the left UPJ reference image 32/3. Mild left-sided hydronephrosis and hydroureter. There are multiple nonobstructing calculi within the right renal collecting system and renal pelvis. Largest calculus in the upper pole measures 11 mm. No right-sided obstruction. No renal masses. The adrenals are unremarkable. Bladder is decompressed, limiting its evaluation. Stomach/Bowel: No bowel obstruction or ileus. Normal appendix right lower quadrant. No bowel wall thickening or inflammatory change. Vascular/Lymphatic: Aortic atherosclerosis. No enlarged abdominal or pelvic lymph nodes. Reproductive: Status post hysterectomy. No adnexal masses. Other: No free fluid or free intraperitoneal gas. No abdominal wall hernia. Musculoskeletal: ORIF of prior right hip fracture. Bones are severely osteopenic. No acute displaced fractures. Reconstructed images demonstrate no additional findings. IMPRESSION: 1. Nonobstructing left ureteral calculi as above, largest at the left UPJ measuring 12 mm. Mild left-sided hydronephrosis and hydroureter. 2. Nonobstructing right renal calculi, measuring up to 11 mm. 3. Cholelithiasis without evidence of cholecystitis. 4.  Aortic Atherosclerosis (ICD10-I70.0). Electronically Signed   By: Sharlet Salina M.D.   On: 08/25/2023 21:10    Procedures Procedures    Medications Ordered in ED Medications  ciprofloxacin (CIPRO) tablet 500 mg (has no administration in time range)  iohexol (OMNIPAQUE) 300 MG/ML solution 100 mL (100 mLs Intravenous Contrast Given 08/25/23 2045)    ED Course/ Medical Decision Making/ A&P                                 Medical Decision Making Amount and/or Complexity of Data Reviewed Labs: ordered. Radiology: ordered.  Risk Prescription drug management.  This patient presents  to the ED with chief complaint(s) of abdominal pain, nausea and vomiting with pertinent past medical history of Lewy body dementia, stroke.The complaint involves an extensive differential diagnosis and also carries with it a high risk of complications and morbidity.    The differential diagnosis includes : Severe dehydration, severe electrolyte abnormality, AKI, UTI, small bowel obstruction, ileus, severe constipation.  The initial plan is to get basic labs, in and out urine with UA and culture.  Patient is not a reliable historian.  Her abdominal  exam is reassuring, but after speaking with the nursing home it appears that the best course will be to get a CT abdomen and pelvis to rule out any concerning pathology.   Additional history obtained: Additional history obtained from nursing home/care facility  Independent labs interpretation:  The following labs were independently interpreted: Patient's UA has large leukocytes, hematuria, pyuria and some bacteria. CBC has normal white count.  Patient has slightly elevated LFTs, bilirubin is 1.9.  Independent visualization and interpretation of imaging: - I independently visualized the following imaging with scope of interpretation limited to determining acute life threatening conditions related to emergency care: CT abdomen pelvis with contrast, which revealed no evidence of obstruction.  However patient was found to have a 12 mm left-sided UPJ stone.  Patient has mild hydronephrosis.  The stone is nonobstructive.  Treatment and Reassessment: Patient reassessed after the CT scan.  She has not vomited in the ER.  Hemodynamics have remained stable and vital signs within normal limit.  Consultation: - Consulted or discussed management/test interpretation with external professional: Spoke with Dr. Cardell Peach, urology. Discussed with him patient's presenting symptoms, her poor neurological status preventing thorough evaluation, abnormal urinalysis and CT scan  with a nonobstructive stone -but with hydronephrosis and the stone being 12 mm in size.  Dr. Cardell Peach indicated that if patient does not have concerning exam, she is not having any systemic symptoms symptoms -then patient can be discharged with urology follow-up and return precautions.  We will discharge the patient with those instructions. I called [name] who is listed as guardian and his patient's son.  I discussed with him the findings, treatment course and return precautions.  Final Clinical Impression(s) / ED Diagnoses Final diagnoses:  Ureteral colic  Lower urinary tract infectious disease    Rx / DC Orders ED Discharge Orders          Ordered    ciprofloxacin (CIPRO) 500 MG tablet  Every 12 hours        08/25/23 2253              Derwood Kaplan, MD 08/25/23 2318

## 2023-08-25 NOTE — Discharge Instructions (Addendum)
Please take the antibiotics that are prescribed.  We suspect that you have a lower UTI.  Additionally, the CAT scan in the emergency room revealed a nonobstructive stone.  We recommend that you follow-up with a urologist for further assessment of it, given that the size is fairly large.  If you start having severe nausea and vomiting, fevers, confusion please return to the ER immediately.

## 2023-08-25 NOTE — ED Triage Notes (Signed)
Patient BIB [facility] from [facility]. Has dementia. Vomited 8 times today. 8 zofran given in the past 24 hours from staff.

## 2023-08-28 LAB — URINE CULTURE: Culture: >=100000 — AB

## 2023-08-29 ENCOUNTER — Telehealth (HOSPITAL_BASED_OUTPATIENT_CLINIC_OR_DEPARTMENT_OTHER): Payer: Self-pay

## 2023-08-29 ENCOUNTER — Other Ambulatory Visit: Payer: Self-pay | Admitting: Urology

## 2023-08-29 NOTE — Telephone Encounter (Signed)
Post ED Visit - Positive Culture Follow-up: Successful Patient Follow-Up  Culture assessed and recommendations reviewed by:  []  Enzo Bi, Pharm.D. []  Celedonio Miyamoto, Pharm.D., BCPS AQ-ID []  Garvin Fila, Pharm.D., BCPS [x]  Georgina Pillion, Pharm.D., BCPS []  [place], [address].D., BCPS, AAHIVP []  Estella Husk, Pharm.D., BCPS, AAHIVP []  Lysle Pearl, PharmD, BCPS []  Phillips Climes, PharmD, BCPS []  Agapito Games, PharmD, BCPS []  Verlan Friends, PharmD  Positive urine culture  []  Patient discharged without antimicrobial prescription and treatment is now indicated [x]  Organism is resistant to prescribed ED discharge antimicrobial []  Patient with positive blood cultures  Changes discussed with ED provider: Theda Belfast, MD New antibiotic prescription Cephalexin 500 mg po BID x 7 days Called and faxed to [facility] patient RN Tresa Endo at [facility], date 08/29/2023, time 10:00 am   Sandria Senter 08/29/2023, 10:06 AM

## 2023-08-29 NOTE — Progress Notes (Signed)
ED Antimicrobial Stewardship Positive Culture Follow Up   Claire West is an 82 y.o. female who presented to [facility] on 08/25/2023 with a chief complaint of  Chief Complaint  Patient presents with   Emesis    Recent Results (from the past 720 hour(s))  Urine Culture     Status: Abnormal   Collection Time: 08/25/23  7:20 PM   Specimen: In/Out Cath Urine  Result Value Ref Range Status   Specimen Description   Final    IN/OUT CATH URINE Performed at [facility], [address], [place], [place] [address]    Special Requests   Final    IN/OUT CATH URINE Performed at [facility], [address] [place], [place] [address]    Culture >=100,000 COLONIES/mL PROTEUS MIRABILIS (A)  Final   Report Status 08/28/2023 FINAL  Final   Organism ID, Bacteria PROTEUS MIRABILIS (A)  Final      Susceptibility   Proteus mirabilis - MIC*    AMPICILLIN <=2 SENSITIVE Sensitive     CEFAZOLIN <=4 SENSITIVE Sensitive     CEFEPIME <=0.12 SENSITIVE Sensitive     CEFTRIAXONE <=0.25 SENSITIVE Sensitive     CIPROFLOXACIN >=4 RESISTANT Resistant     GENTAMICIN <=1 SENSITIVE Sensitive     IMIPENEM 2 SENSITIVE Sensitive     NITROFURANTOIN 128 RESISTANT Resistant     TRIMETH/SULFA <=20 SENSITIVE Sensitive     AMPICILLIN/SULBACTAM <=2 SENSITIVE Sensitive     PIP/TAZO <=4 SENSITIVE Sensitive ug/mL    * >=100,000 COLONIES/mL PROTEUS MIRABILIS    [x]  Treated with Ciprofloxacin, organism resistant to prescribed antimicrobial  The patient has a noted penicillin allergy however had tolerated Rocephin inpatient and appears to have outpatient scripts for Cefpodoxime + Cephalexin. The patient resides at a SNF so will have close monitoring and observation.   New antibiotic prescription: Cephalexin 500 mg po bid x 7 days, need to discontinue Cipro  ED Provider: Theda Belfast, MD  Thank you for allowing pharmacy to be a part of this patient's care.  Georgina Pillion,  PharmD, BCPS, BCIDP Infectious Diseases Clinical Pharmacist 08/29/2023 9:32 AM   **Pharmacist phone directory can now be found on [email or web address] ([email or web address]).  Listed under [facility].

## 2023-09-02 NOTE — Progress Notes (Addendum)
For Anesthesia: PCP - Dr. Yehuda Mao progress note in patients blue chart 08/22/2023 and progress note by Ferdie Ping FNP 08/30/23  Cardiologist - Netta Neat., NP   Chest x-ray - greater than 1 year in [facility] EKG - 08/25/23 in [facility] Stress Test - N/A ECHO - greater than 2 years in [facility] Cardiac Cath - N/A Pacemaker/ICD device last checked:N/A Pacemaker orders received:N/A Device Rep notified:N/A  Spinal Cord Stimulator:N/A  Sleep Study - N/A CPAP - N/A  Fasting Blood Sugar - N/A Checks Blood Sugar __N/A___ times a day Date and result of last Hgb A1c-N/A  Blood Thinner Instructions: N/A Aspirin Instructions: N/A Last Dose: N/A  Activity level:  Unable to go up a flight of stairs without chest pain and/or shortness of breath     Anesthesia review: HTN, Stroke,   Patient denies shortness of breath, fever, cough and chest pain during pre op phone call.   Patient verbalized understanding of instructions reviewed via telephone.

## 2023-09-10 ENCOUNTER — Encounter (HOSPITAL_COMMUNITY): Payer: Self-pay | Admitting: Urology

## 2023-09-10 NOTE — Patient Instructions (Addendum)
Preop instructions for: Claire West  Date of Birth: Aug 12, 1941                      Date of Procedure:  Wednesday, Dec. 18, 2024 Procedure: STAGE 1 LEFT URETEROSCOPY/HOLMIUM LASER/STENT      Surgeon: Dr. Traci Sermon Facility contact:  [facility]   Phone:   [phone]              Health Care POA: RN contact name/phone#:  Kae Heller LPN                        and Fax #: [phone]   Transportation contact phone#: Jolene Schimke (Patient's son) [phone]  Please send day of procedure:  Current med list  Medications taken the day of procedure (return attached form to hospital) confirm time of nothing by mouth status (return attached form to hospital) Patient Demographic info( to include DNR status, problem list, allergies) Bring Insurance card and picture ID    Time to arrive at [facility]: 5:30 A.M.   Report to: Admitting (On your left hand side)    Do not eat solid food or drink past midnight the night before your procedure.(To include any tube feedings-must be discontinued)   Take these morning medications only with sips of water.(or give through gastrostomy or feeding tube).  Use eye drops per normal routine Buspirone Hydralazine Metoprolol Omeprazole  Stop all vitamins and herbal supplements 7 days before surgery.   Note: No Insulin or Diabetic meds should be given or taken the morning of the procedure!  Oral Hygiene is also important to reduce your risk of infection.                                    Remember - BRUSH YOUR TEETH THE MORNING OF SURGERY WITH YOUR REGULAR TOOTHPASTE   DENTURES WILL BE REMOVED PRIOR TO SURGERY PLEASE DO NOT APPLY "Poly grip" OR ADHESIVES!!!  Leave all jewelry and other valuables at place where living( no metal or rings to be worn) No contact lens Women-no make-up, no lotions,perfumes,powders   Any questions day of procedure,call  SHORT STAY-[phone]     Sent from :[facility] Presurgical Testing                    Phone:[phone]                   [phone]   Sent by : Aedan Geimer BSN, RN

## 2023-09-11 ENCOUNTER — Encounter (HOSPITAL_COMMUNITY): Payer: Self-pay | Admitting: Urology

## 2023-09-11 NOTE — Progress Notes (Signed)
Case: [id number] Date/Time: 09/25/23 0815   Procedure: STAGE 1 LEFT URETEROSCOPY/HOLMIUM LASER/STENT (Left) - 75 MINUTES NEEDED FOR CASE   Anesthesia type: General   Pre-op diagnosis: LEFT URETERAL AND RENAL STONE   Location: [facility]   Surgeons: Despina Arias, MD       DISCUSSION: Claire West is an 82 yo female who is being evaluated prior to surgery above. Patient has advanced dementia and resides in a SNF. Patient's son is POA Claire West). PMH of HTN, CAD (by CT), TIA/CVA with residual left sided weakness, GERD, anemia.  Patient presented to the ED on 08/25/23 for N/V. Was diagnosed with large left sided kidney stone. No signs of infection or AKI so patient was discharged back to her facility and now scheduled for staged procedures.  Last seen by SNF provider on 08/28/23. She is DNR. All issues stable.  Had an evaluation by Cardiology in 2021   VS: Ht 5\' 2"  (1.575 m)   Wt 71.4 kg   BMI 28.77 kg/m   PROVIDERS: Assunta Found, MD   LABS: Obtain DOS (all labs ordered are listed, but only abnormal results are displayed)  Labs Reviewed - No data to display   IMAGES: CT A/P 08/25/23:  IMPRESSION: 1. Nonobstructing left ureteral calculi as above, largest at the left UPJ measuring 12 mm. Mild left-sided hydronephrosis and hydroureter. 2. Nonobstructing right renal calculi, measuring up to 11 mm. 3. Cholelithiasis without evidence of cholecystitis. 4.  Aortic Atherosclerosis (ICD10-I70.0).  EKG:   CV:  Cardiac CTA 03/21/2020:   IMPRESSION: 1. Coronary calcium score of 1531. This was 50 percentile for age and sex matched control.   2. Moderate CAD. CADRADS 3. This study will be sent for FFRct analysis.   3. Normal coronary origin with right dominance.   4. Mild mitral annular calcification.   5. Aortic atherosclerosis.  EXAM: FFRCT ANALYSIS   FINDINGS: FFRct analysis was performed on the original cardiac CT angiogram dataset.  Diagrammatic representation of the FFRct analysis is provided in a separate PDF document in PACS. This dictation was created using the PDF document and an interactive 3D model of the results. 3D model is not available in the EMR/PACS. Normal FFR range is >0.80.   1. Left Main: 0.98   2. LAD: Proximal LAD 0.96, Mid LAD 0.95, Distal LAD 0.85   3. LCX: 0.93   4. RCA: Proximal RCA 0.90, Mid RCA 0.87, Distal RCA 0.84   IMPRESSION: The FFRct analysis as described above, shows no evidence of any significant flow limiting lesion. Recommend Medical management.  Cardiac Monitor 03/29/2020 Study Highlights   NSR  No significant arrhythmia One episode with lead loss not real asystole   Echo 12/27/2019:  IMPRESSIONS    1. Left ventricular ejection fraction, by estimation, is 55 to 60%. The left ventricle has normal function. The left ventricle has no regional wall motion abnormalities. Left ventricular diastolic parameters are consistent with Grade I diastolic dysfunction (impaired relaxation).  2. Right ventricular systolic function is normal. The right ventricular size is normal. Tricuspid regurgitation signal is inadequate for assessing PA pressure.  3. The mitral valve is grossly normal. Trivial mitral valve regurgitation.  4. The aortic valve was not well visualized. Aortic valve regurgitation is mild.  Past Medical History:  Diagnosis Date   Acute blood loss as cause of postoperative anemia 06/29/2020   Acute CVA (cerebrovascular accident) (HCC) 12/25/2019   Acute metabolic encephalopathy 11/07/2017   Anxiety    Arthritis  Bilateral lower extremity edema    Closed right hip fracture (HCC) 06/27/2020   Constipation    Depression    Fracture of distal end of radius 02/12/2018   Gait instability    walks with cane   GERD (gastroesophageal reflux disease)    Hemiplegia and hemiparesis following cerebral infarction affecting left non-dominant side (HCC)    History of  blood transfusion    Hyperlipemia    Hypertension    Hypokalemia    Hypomagnesemia    Insomnia    Knee pain    Metabolic encephalopathy    Mild cognitive impairment with memory loss    Neurocognitive disorder with Lewy bodies (CODE) (HCC)    On supplemental oxygen therapy    as needed   Seasonal allergies    Sepsis (HCC) 12/10/2021   Skin cancer    Skin cancer    Stroke [facility])    TIA (transient ischemic attack)    Urinary tract infection symptoms 12/08/2021   Vascular dementia (HCC)    Vitamin D deficiency     Past Surgical History:  Procedure Laterality Date   ABDOMINAL HYSTERECTOMY     ARTHROSCOPIC REPAIR ACL Left    COLONOSCOPY     COLONOSCOPY N/A 11/23/2015   Procedure: COLONOSCOPY;  Surgeon: Malissa Hippo, MD;  [facility];  Service: Endoscopy;  Laterality: N/A;  830   EYE SURGERY Bilateral    Cataracts   SKIN CANCER EXCISION     Right shoulder   TOTAL KNEE ARTHROPLASTY Left 05/05/2018   Procedure: LEFT TOTAL KNEE ARTHROPLASTY;  Surgeon: Ollen Gross, MD;  Location: [facility];  Service: Orthopedics;  Laterality: Left;  with block   TOTAL KNEE ARTHROPLASTY Right 06/22/2019   Procedure: TOTAL KNEE ARTHROPLASTY;  Surgeon: Ollen Gross, MD;  Location: [facility];  Service: Orthopedics;  Laterality: Right;     MEDICATIONS: No current facility-administered medications for this encounter.    acetaminophen (TYLENOL) 325 MG tablet   ARTIFICIAL TEAR OP   aspirin EC 81 MG tablet   busPIRone (BUSPAR) 5 MG tablet   calcium carbonate (TUMS - DOSED IN MG ELEMENTAL CALCIUM) 500 MG chewable tablet   cholecalciferol (VITAMIN D) 25 MCG (1000 UNIT) tablet   ciprofloxacin (CIPRO) 500 MG tablet   D-Mannose POWD   escitalopram (LEXAPRO) 20 MG tablet   famotidine (PEPCID) 20 MG tablet   fexofenadine (ALLEGRA) 180 MG tablet   furosemide (LASIX) 20 MG tablet   hydrALAZINE (APRESOLINE) 50 MG tablet   HYDROcodone-acetaminophen (NORCO/VICODIN) 5-325 MG tablet    LACTOBACILLUS PO   magnesium oxide (MAG-OX) 400 MG tablet   Menthol, Topical Analgesic, (BIOFREEZE) 4 % GEL   metoprolol succinate (TOPROL-XL) 50 MG 24 hr tablet   Multiple Vitamins-Minerals (CERTAVITE SENIOR PO)   olmesartan (BENICAR) 40 MG tablet   Omega-3 Fatty Acids (SUPER OMEGA 3 EPA/DHA) 1000 MG CAPS   omeprazole (PRILOSEC) 40 MG capsule   ondansetron (ZOFRAN-ODT) 4 MG disintegrating tablet   polyethylene glycol (MIRALAX / GLYCOLAX) 17 g packet   potassium chloride (MICRO-K) 10 MEQ CR capsule   traZODone (DESYREL) 50 MG tablet   Ubaldo Glassing, PA-C [facility] Short Stay/Anesthesiology [facility] Phone [phone] 09/11/2023 3:54 PM

## 2023-09-11 NOTE — Progress Notes (Signed)
Claire West from [place] confirmed they received Claire West's pre op instructions, verbalized understanding, no questions at this time.

## 2023-09-12 NOTE — Anesthesia Preprocedure Evaluation (Addendum)
Anesthesia Evaluation  Patient identified by MRN, date of birth, ID band Patient awake    Reviewed: Allergy & Precautions, H&P , NPO status , Patient's Chart, lab work & pertinent test results  Airway Mallampati: II   Neck ROM: full    Dental   Pulmonary neg pulmonary ROS   breath sounds clear to auscultation       Cardiovascular hypertension,  Rhythm:regular Rate:Normal     Neuro/Psych  PSYCHIATRIC DISORDERS Anxiety Depression   Dementia TIACVA    GI/Hepatic ,GERD  ,,  Endo/Other    Renal/GU stones     Musculoskeletal  (+) Arthritis ,    Abdominal   Peds  Hematology   Anesthesia Other Findings   Reproductive/Obstetrics                             Anesthesia Physical Anesthesia Plan  ASA: 3  Anesthesia Plan: General   Post-op Pain Management:    Induction: Intravenous  PONV Risk Score and Plan: 3 and Ondansetron, Dexamethasone and Treatment may vary due to age or medical condition  Airway Management Planned: LMA  Additional Equipment:   Intra-op Plan:   Post-operative Plan: Extubation in OR  Informed Consent: I have reviewed the patients History and Physical, chart, labs and discussed the procedure including the risks, benefits and alternatives for the proposed anesthesia with the patient or authorized representative who has indicated his/her understanding and acceptance.     Dental advisory given  Plan Discussed with: CRNA, Anesthesiologist and Surgeon  Anesthesia Plan Comments: (See PAT note from 12/4 by Sherlie Ban PA-C )        Anesthesia Quick Evaluation

## 2023-09-25 ENCOUNTER — Encounter (HOSPITAL_COMMUNITY): Payer: Self-pay | Admitting: Urology

## 2023-09-25 ENCOUNTER — Other Ambulatory Visit: Payer: Self-pay

## 2023-09-25 ENCOUNTER — Ambulatory Visit (HOSPITAL_COMMUNITY)
Admission: RE | Admit: 2023-09-25 | Discharge: 2023-09-25 | Disposition: A | Discharge status: Home / Self Care | Payer: Medicare Other | Attending: Urology | Admitting: Urology

## 2023-09-25 ENCOUNTER — Encounter (HOSPITAL_COMMUNITY)
Admission: RE | Disposition: A | Discharge status: Home / Self Care | Payer: Self-pay | Source: Home / Self Care | Attending: Urology

## 2023-09-25 ENCOUNTER — Ambulatory Visit (HOSPITAL_COMMUNITY): Payer: Medicare Other

## 2023-09-25 ENCOUNTER — Ambulatory Visit (HOSPITAL_BASED_OUTPATIENT_CLINIC_OR_DEPARTMENT_OTHER): Payer: Medicare Other | Admitting: Medical

## 2023-09-25 ENCOUNTER — Ambulatory Visit (HOSPITAL_COMMUNITY): Payer: Medicare Other | Admitting: Medical

## 2023-09-25 DIAGNOSIS — N202 Calculus of kidney with calculus of ureter: Secondary | ICD-10-CM

## 2023-09-25 DIAGNOSIS — I69354 Hemiplegia and hemiparesis following cerebral infarction affecting left non-dominant side: Secondary | ICD-10-CM | POA: Diagnosis not present

## 2023-09-25 DIAGNOSIS — K219 Gastro-esophageal reflux disease without esophagitis: Secondary | ICD-10-CM | POA: Diagnosis not present

## 2023-09-25 DIAGNOSIS — F0154 Vascular dementia, unspecified severity, with anxiety: Secondary | ICD-10-CM | POA: Diagnosis not present

## 2023-09-25 DIAGNOSIS — F0153 Vascular dementia, unspecified severity, with mood disturbance: Secondary | ICD-10-CM | POA: Insufficient documentation

## 2023-09-25 DIAGNOSIS — I10 Essential (primary) hypertension: Secondary | ICD-10-CM

## 2023-09-25 HISTORY — PX: CYSTOSCOPY/URETEROSCOPY/HOLMIUM LASER: SHX6545

## 2023-09-25 HISTORY — DX: Insomnia, unspecified: G47.00

## 2023-09-25 HISTORY — DX: Dependence on supplemental oxygen: Z99.81

## 2023-09-25 HISTORY — DX: Hemiplegia and hemiparesis following cerebral infarction affecting left non-dominant side: I69.354

## 2023-09-25 HISTORY — DX: Vitamin D deficiency, unspecified: E55.9

## 2023-09-25 HISTORY — DX: Localized edema: R60.0

## 2023-09-25 HISTORY — DX: Hypomagnesemia: E83.42

## 2023-09-25 HISTORY — DX: Neurocognitive disorder with Lewy bodies: G31.83

## 2023-09-25 HISTORY — DX: Constipation, unspecified: K59.00

## 2023-09-25 HISTORY — DX: Hypokalemia: E87.6

## 2023-09-25 HISTORY — DX: Anxiety disorder, unspecified: F41.9

## 2023-09-25 HISTORY — DX: Vascular dementia, unspecified severity, without behavioral disturbance, psychotic disturbance, mood disturbance, and anxiety: F01.50

## 2023-09-25 SURGERY — CYSTOURETEROSCOPY, USING HOLMIUM LASER
Anesthesia: General | Laterality: Left

## 2023-09-25 MED ORDER — ORAL CARE MOUTH RINSE: 15.0000 mL | Freq: Once | OROMUCOSAL | Status: DC

## 2023-09-25 MED ORDER — ONDANSETRON HCL 4 MG/2ML IJ SOLN
INTRAMUSCULAR | Status: DC | PRN
  Administered 2023-09-25: 4 mg via INTRAVENOUS

## 2023-09-25 MED ORDER — LIDOCAINE 2% (20 MG/ML) 5 ML SYRINGE
INTRAMUSCULAR | Status: DC | PRN
  Administered 2023-09-25: 40 mg via INTRAVENOUS

## 2023-09-25 MED ORDER — FENTANYL CITRATE (PF) 100 MCG/2ML IJ SOLN
INTRAMUSCULAR | Status: DC | PRN
  Administered 2023-09-25: 50 ug via INTRAVENOUS
  Administered 2023-09-25 (×2): 25 ug via INTRAVENOUS

## 2023-09-25 MED ORDER — PHENYLEPHRINE HCL (PRESSORS) 10 MG/ML IV SOLN
INTRAVENOUS | Status: AC
  Filled 2023-09-25: qty 1

## 2023-09-25 MED ORDER — TRAMADOL HCL 50 MG PO TABS: 50.0000 mg | ORAL_TABLET | Freq: Four times a day (QID) | ORAL | 0 refills | Status: AC | PRN

## 2023-09-25 MED ORDER — FENTANYL CITRATE (PF) 100 MCG/2ML IJ SOLN
INTRAMUSCULAR | Status: AC
  Filled 2023-09-25: qty 2

## 2023-09-25 MED ORDER — LACTATED RINGERS IV SOLN: INTRAVENOUS | Status: DC

## 2023-09-25 MED ORDER — HYOSCYAMINE SULFATE 0.125 MG SL SUBL
0.1250 mg | SUBLINGUAL_TABLET | Freq: Four times a day (QID) | SUBLINGUAL | 0 refills | Status: AC | PRN
Start: 2023-09-25 — End: ?

## 2023-09-25 MED ORDER — FOSFOMYCIN TROMETHAMINE 3 G PO PACK: 3.0000 g | PACK | ORAL | 0 refills | Status: DC

## 2023-09-25 MED ORDER — SODIUM CHLORIDE 0.9 % IR SOLN
Status: DC | PRN
  Administered 2023-09-25: 3000 mL via INTRAVESICAL

## 2023-09-25 MED ORDER — PROPOFOL 10 MG/ML IV BOLUS
INTRAVENOUS | Status: DC | PRN
  Administered 2023-09-25: 70 mg via INTRAVENOUS

## 2023-09-25 MED ORDER — LIDOCAINE HCL (PF) 2 % IJ SOLN
INTRAMUSCULAR | Status: AC
  Filled 2023-09-25: qty 5

## 2023-09-25 MED ORDER — CHLORHEXIDINE GLUCONATE 0.12 % MT SOLN: 15.0000 mL | Freq: Once | OROMUCOSAL | Status: DC

## 2023-09-25 MED ORDER — CEFTRIAXONE SODIUM 1 G IJ SOLR
1.0000 g | Freq: Once | INTRAMUSCULAR | Status: AC
  Administered 2023-09-25: 2 g via INTRAVENOUS
  Filled 2023-09-25: qty 10

## 2023-09-25 MED ORDER — IOHEXOL 300 MG/ML  SOLN
INTRAMUSCULAR | Status: DC | PRN
  Administered 2023-09-25: 10 mL via URETHRAL

## 2023-09-25 MED ORDER — PHENYLEPHRINE HCL-NACL 20-0.9 MG/250ML-% IV SOLN
INTRAVENOUS | Status: DC | PRN
  Administered 2023-09-25: 35 ug/min via INTRAVENOUS

## 2023-09-25 MED ORDER — ONDANSETRON HCL 4 MG/2ML IJ SOLN
INTRAMUSCULAR | Status: AC
  Filled 2023-09-25: qty 2

## 2023-09-25 MED ORDER — PROPOFOL 10 MG/ML IV BOLUS
INTRAVENOUS | Status: AC
  Filled 2023-09-25: qty 20

## 2023-09-25 MED ORDER — PHENYLEPHRINE 80 MCG/ML (10ML) SYRINGE FOR IV PUSH (FOR BLOOD PRESSURE SUPPORT)
PREFILLED_SYRINGE | INTRAVENOUS | Status: DC | PRN
  Administered 2023-09-25: 80 ug via INTRAVENOUS
  Administered 2023-09-25: 160 ug via INTRAVENOUS

## 2023-09-25 SURGICAL SUPPLY — 22 items
BAG COUNTER SPONGE SURGICOUNT (BAG) IMPLANT
BAG URO CATCHER STRL LF (MISCELLANEOUS) ×2 IMPLANT
BASKET ZERO TIP NITINOL 2.4FR (BASKET) IMPLANT
CATH ROBINSON RED A/P 14FR (CATHETERS) IMPLANT
CATH URETERAL DUAL LUMEN 10F (MISCELLANEOUS) ×2 IMPLANT
CATH URETL OPEN END 6FR 70 (CATHETERS) IMPLANT
CLOTH BEACON ORANGE TIMEOUT ST (SAFETY) ×2 IMPLANT
GLOVE SS BIOGEL STRL SZ 7 (GLOVE) ×2 IMPLANT
GLOVE SURG LX STRL 7.5 STRW (GLOVE) ×2 IMPLANT
GOWN STRL REUS W/ TWL XL LVL3 (GOWN DISPOSABLE) ×2 IMPLANT
GUIDEWIRE STR DUAL SENSOR (WIRE) ×2 IMPLANT
GUIDEWIRE ZIPWRE .038 STRAIGHT (WIRE) ×2 IMPLANT
IV NS 1000ML BAXH (IV SOLUTION) ×2 IMPLANT
KIT TURNOVER KIT A (KITS) IMPLANT
LASER FIB FLEXIVA PULSE ID 365 (Laser) IMPLANT
MANIFOLD NEPTUNE II (INSTRUMENTS) ×2 IMPLANT
PACK CYSTO (CUSTOM PROCEDURE TRAY) ×2 IMPLANT
SHEATH NAVIGATOR HD 11/13X36 (SHEATH) IMPLANT
STENT URET 6FRX24 CONTOUR (STENTS) IMPLANT
TRACTIP FLEXIVA PULS ID 200XHI (Laser) IMPLANT
TUBING CONNECTING 10 (TUBING) ×2 IMPLANT
TUBING UROLOGY SET (TUBING) ×2 IMPLANT

## 2023-09-25 NOTE — Op Note (Signed)
Operative Note  Preoperative diagnosis:  1.  Left ureteral and renal stones 2. Left ureteral obstruction due to stone 3. Right renal stones  Postoperative diagnosis: 1.  same  Procedure(s): 1.  Left ureteroscopy with laser lithotripsy and basket extraction of stones 2. Cystoscopy  3. Left retrograde pyelogram 4. Bilateral ureteral stent placement 5. Fluoroscopy with intraoperative interpretation  Surgeon: Irine Seal, MD  Assistants:  None  Anesthesia:  General  Complications:  None  EBL:  Minimal  Specimens: 1. None  Drains/Catheters: 1.  Left 6Fr x 24 cm ureteral stent  2. Right 6 x 24 cm ureteral stent  Intraoperative findings:   Cystoscopy demonstrated unremarkable bladder Left Ureteroscopy demonstrated stone in distal ureter and large impacted stone at left UPJ Successful bilateral stent placement.  Left retrograde pyelogram showed left hydronephrosis and filling defect in kidney consistent with position of stone (stone was clearly visible on KUB)   Description of procedure: After informed consent was obtained from the patient, the patient was identified and taken to the operating room and placed in the supine position.  General anesthesia was administered as well as perioperative IV antibiotics.  At the beginning of the case, a time-out was performed to properly identify the patient, the surgery to be performed, and the surgical site.  Sequential compression devices were applied to the lower extremities at the beginning of the case for DVT prophylaxis.  The patient was then placed in the dorsal lithotomy supine position, prepped and draped in sterile fashion.  We then passed the 21-French rigid cystoscope through the urethra and into the bladder under vision without any difficulty, noting a normal urethra.  A systematic evaluation of the bladder revealed no evidence of any suspicious bladder lesions.  Ureteral orifices were in normal position.    We then passed a  0.038 glide wire up to the level of the left renal pelvis.  The cystoscope was withdrawn, and a semi rigid ureteroscope was inserted into the left ureter. I found a stone in the distal ureter; this was completely dusted with the laser. A few small pieces were removed into the bladder with the zero tip basket. I was then able to advance the scope up to the UPJ. There was a large impacted stone here. I used the laser to fragment the stone then pushed it into the kidney.   A 0.038 sensor wire was then passed into the kidney through the ureteroscope, which was then removed. The flexible ureteroscope was advanced into the collecting system via the sensor wire. The collecting system was inspected. The calculus was identified  and dusted using the 242 micron holmium laser fiber. With the ureteroscope in the kidney, a gentle pyelogram was performed to delineate the calyceal system and we evaluated the calyces systematically. We encountered no further stone fragments >3 mm, although there was a substantial amount of debris given the size of the stone. The rest of the stone fragments were very tiny and these were  irrigated away gently. The calyces were re-inspected and there were no significant stone fragment residual.   We then withdrew the ureteroscope back down the ureter along with the access sheath, noting no evidence of any stones along the course of the ureter.  Prior to removing the ureteroscope, we did pass the Glidewire back up to the ureter to the renal pelvis.  Once the ureteroscope was removed, we then used the Glidewire under fluoroscopic guidance and passed up a 6-French x 24 cm double-pigtail ureteral stent up the  ureter, making sure that the proximal and distal ends coiled within the kidney and bladder respectively.  The cystoscope was then inserted and R UO identified. We then used the Glidewire under direct vision through the rigid cystoscope and under fluoroscopic guidance and passed up a 6-French,  24 cm double-pigtail ureteral stent up, making sure that the proximal and distal ends coiled within the kidney and bladder respectively.  We were able to see the distal stent coiling nicely within the bladder bilaterally.  The bladder was then emptied with irrigation solution.  The cystoscope was then removed.    The patient tolerated the procedure well and there was no complication. Patient was awoken from anesthesia and taken to the recovery room in stable condition. I was present and scrubbed for the entirety of the case.  Plan:  Patient will be discharged home plan for staged ureteroscopy on the right as scheduled; will possibly re-examine the left side at that time as well vs simple stent removal  G. Irine Seal MD [facility]  Pager: [phone]

## 2023-09-25 NOTE — Anesthesia Procedure Notes (Signed)
Procedure Name: LMA Insertion Date/Time: 09/25/2023 8:45 AM  Performed by: Orest Dikes, CRNAPre-anesthesia Checklist: Patient identified, Emergency Drugs available, Suction available and Patient being monitored Patient Re-evaluated:Patient Re-evaluated prior to induction Oxygen Delivery Method: Circle system utilized Preoxygenation: Pre-oxygenation with 100% oxygen Induction Type: IV induction LMA: LMA with gastric port inserted LMA Size: 4.0 Number of attempts: 1 Placement Confirmation: positive ETCO2 and breath sounds checked- equal and bilateral Tube secured with: Tape Dental Injury: Teeth and Oropharynx as per pre-operative assessment

## 2023-09-25 NOTE — H&P (Signed)
H&P  History of Present Illness: Claire West is a 82 y.o. year old F who presents today for treatment of left ureteral and renal stones  No acute complaints  Past Medical History:  Diagnosis Date   Acute blood loss as cause of postoperative anemia 06/29/2020   Acute CVA (cerebrovascular accident) (HCC) 12/25/2019   Acute metabolic encephalopathy 11/07/2017   Anxiety    Arthritis    Bilateral lower extremity edema    Closed right hip fracture (HCC) 06/27/2020   Constipation    Depression    Fracture of distal end of radius 02/12/2018   Gait instability    walks with cane   GERD (gastroesophageal reflux disease)    Hemiplegia and hemiparesis following cerebral infarction affecting left non-dominant side (HCC)    History of blood transfusion    Hyperlipemia    Hypertension    Hypokalemia    Hypomagnesemia    Insomnia    Knee pain    Metabolic encephalopathy    Mild cognitive impairment with memory loss    Neurocognitive disorder with Lewy bodies (CODE) (HCC)    On supplemental oxygen therapy    as needed   Seasonal allergies    Sepsis (HCC) 12/10/2021   Skin cancer    Stroke (HCC)    TIA (transient ischemic attack)    Urinary tract infection symptoms 12/08/2021   Vascular dementia (HCC)    Vitamin D deficiency     Past Surgical History:  Procedure Laterality Date   ABDOMINAL HYSTERECTOMY     ARTHROSCOPIC REPAIR ACL Left    COLONOSCOPY     COLONOSCOPY N/A 11/23/2015   Procedure: COLONOSCOPY;  Surgeon: Malissa Hippo, MD;  [facility];  Service: Endoscopy;  Laterality: N/A;  830   EYE SURGERY Bilateral    Cataracts   SKIN CANCER EXCISION     Right shoulder   TOTAL KNEE ARTHROPLASTY Left 05/05/2018   Procedure: LEFT TOTAL KNEE ARTHROPLASTY;  Surgeon: Ollen Gross, MD;  Location[facility];  Service: Orthopedics;  Laterality: Left;  with block   TOTAL KNEE ARTHROPLASTY Right 06/22/2019   Procedure: TOTAL KNEE ARTHROPLASTY;  Surgeon: Ollen Gross, MD;   Location[facility];  Service: Orthopedics;  Laterality: Right;     Home Medications:  Current Meds  Medication Sig   acetaminophen (TYLENOL) 325 MG tablet Take 2 tablets (650 mg total) by mouth every 6 (six) hours as needed for mild pain (or Fever >/= 101). (Patient taking differently: Take 650 mg by mouth 3 (three) times daily as needed for mild pain (pain score 1-3), fever or headache (or Fever >/= 101).)   ALPRAZolam (XANAX) 0.25 MG tablet Take 0.25 mg by mouth 2 (two) times daily as needed for anxiety.   ARTIFICIAL TEAR OP Place 2 drops into both eyes in the morning and at bedtime. 1.4 %   aspirin EC 81 MG tablet Take 81 mg by mouth every morning.    busPIRone (BUSPAR) 5 MG tablet Take 5 mg by mouth 2 (two) times daily.   calcium carbonate (TUMS - DOSED IN MG ELEMENTAL CALCIUM) 500 MG chewable tablet Chew 500 mg by mouth daily.   cholecalciferol (VITAMIN D) 25 MCG (1000 UNIT) tablet Take 1,000 Units by mouth daily.   D-Mannose 500 MG CAPS Take 2,000 mg by mouth every morning.   escitalopram (LEXAPRO) 20 MG tablet Take 20 mg by mouth at bedtime.   famotidine (PEPCID) 20 MG tablet Take 20 mg by mouth every evening.  fexofenadine (ALLEGRA) 180 MG tablet Take 180 mg by mouth daily as needed for allergies or rhinitis.   fosfomycin (MONUROL) 3 g PACK Take 3 g by mouth See admin instructions. Every 4 weeks on Tuesday   furosemide (LASIX) 20 MG tablet Take 40 mg by mouth daily.   hydrALAZINE (APRESOLINE) 25 MG tablet Take 25 mg by mouth 2 (two) times daily. Hold of BP<130   HYDROcodone-acetaminophen (NORCO/VICODIN) 5-325 MG tablet Take 1 tablet by mouth 2 (two) times daily as needed for moderate pain. (Patient taking differently: Take 1 tablet by mouth every 12 (twelve) hours as needed for moderate pain (pain score 4-6).)   hydrocortisone cream 0.5 % Apply 1 Application topically 2 (two) times daily as needed for itching (Top of nose).   LACTOBACILLUS PO Take 10 Billion Cells by mouth daily.    magnesium oxide (MAG-OX) 400 MG tablet Take 400 mg by mouth at bedtime.   Menthol, Topical Analgesic, (BIOFREEZE) 4 % GEL Apply 1 application  topically 2 (two) times daily. Right hip, knees and or any area of pain   metoprolol succinate (TOPROL-XL) 50 MG 24 hr tablet Take 50 mg by mouth 2 (two) times daily.   Multiple Vitamins-Minerals (CERTAVITE SENIOR PO) Take 1 tablet by mouth daily.   NYSTATIN powder Apply 1 Application topically 3 (three) times daily. Apply to bilateral under arms until heal every shift   olmesartan (BENICAR) 40 MG tablet Take 40 mg by mouth daily.   Omega-3 Fatty Acids (SUPER OMEGA 3 EPA/DHA) 1000 MG CAPS Take 1,000 mg by mouth 2 (two) times daily.   omeprazole (PRILOSEC) 40 MG capsule Take 40 mg by mouth daily.   ondansetron (ZOFRAN-ODT) 4 MG disintegrating tablet Take 4 mg by mouth every 6 (six) hours as needed for nausea or vomiting.   OXYGEN Inhale into the lungs daily as needed (Apply via Nasal Cannula to maintain sats greater than 93%).   polyethylene glycol (MIRALAX / GLYCOLAX) 17 g packet Take 17 g by mouth daily as needed for moderate constipation or mild constipation. Mix with 4-8 oz of water or jouce   potassium chloride (MICRO-K) 10 MEQ CR capsule Take 10 mEq by mouth daily.   rosuvastatin (CRESTOR) 10 MG tablet Take 10 mg by mouth at bedtime.   traZODone (DESYREL) 50 MG tablet Take 1 tablet (50 mg total) by mouth at bedtime as needed for sleep. (Patient taking differently: Take 25 mg by mouth at bedtime as needed for sleep (dose halved 12/31/22).)    Allergies:  Allergies  Allergen Reactions   Penicillins Anaphylaxis, Swelling and Rash    Oral rash/peeling  **Has tolerated Rocephin, Vantin since 2017**     Butalbital-Asa-Caff-Codeine    Guaifenesin-Codeine    Macrobid [Nitrofurantoin]    Adhesive [Tape] Itching and Rash    Irritation at site   Codeine Hives and Rash   Neosporin [Neomycin-Bacitracin Zn-Polymyx] Rash   Sulfa Antibiotics Hives and  Rash    Family History  Problem Relation Age of Onset   Lung cancer Mother    Lung cancer Sister     Social History:  reports that she has never smoked. She has never used smokeless tobacco. She reports that she does not currently use alcohol. She reports that she does not use drugs.  ROS: A complete review of systems was performed.  All systems are negative except for pertinent findings as noted.  Physical Exam:  Vital signs in last 24 hours: Temp:  [98 F (36.7 C)] 98 F (36.7  C) (12/18 0659) Pulse Rate:  [70] 70 (12/18 0659) Resp:  [18] 18 (12/18 0659) BP: (115)/(65) 115/65 (12/18 0659) Weight:  [71.4 kg] 71.4 kg (12/18 0659) Constitutional:  Alert and oriented, No acute distress Cardiovascular: Regular rate and rhythm Respiratory: Normal respiratory effort, Lungs clear bilaterally GI: Abdomen is soft, nontender, nondistended, no abdominal masses Lymphatic: No lymphadenopathy Neurologic: Grossly intact, no focal deficits Psychiatric: Normal mood and affect   Laboratory Data:  No results for input(s): "WBC", "HGB", "HCT", "PLT" in the last 72 hours.  No results for input(s): "NA", "K", "CL", "GLUCOSE", "BUN", "CALCIUM", "CREATININE" in the last 72 hours.  Invalid input(s): "CO3"   No results found for this or any previous visit (from the past 24 hours). No results found for this or any previous visit (from the past 240 hours).  Renal Function: No results for input(s): "CREATININE" in the last 168 hours. CrCl cannot be calculated (Patient's most recent lab result is older than the maximum 21 days allowed.).  Radiologic Imaging: No results found.  Assessment:  Claire West is a 82 y.o. year old F with left ureteral and renal stones  Plan:  --to OR as planned for L ureteroscopy with laser litho, stent. Procedure and risks reviewed, including but not limited to hematuria, infection, sepsis, damage to GU tract, failure to complete procedure, retained stone  fragments, need for future procedures, stent pain, prolonged stent.   Current plan is for surgery today then approach her right side in a staged fashion  Irine Seal, MD 09/25/2023, 8:18 AM  [facility] Pager: [phone]

## 2023-09-25 NOTE — Discharge Instructions (Addendum)

## 2023-09-25 NOTE — OR Nursing (Signed)
1610- Patient has 2 consent orders. Called Dr. Lafonda Mosses for clarification and to obtain verbal order for antibiotics. Left voicemail for MD to return call.   0703- spoke with Dr. Lafonda Mosses- clarified today's procedure it for stage 1 left ureteroscopy/holmium laser/stent-Left Verbal order for Rocephin 1g given and order entered.

## 2023-09-25 NOTE — Transfer of Care (Signed)
Immediate Anesthesia Transfer of Care Note  Patient: Claire West  Procedure(s) Performed: STAGE 1 LEFT URETEROSCOPY/HOLMIUM LASER/STENT/RIGHT STENT (Left)  Patient Location: PACU  Anesthesia Type:General  Level of Consciousness: drowsy  Airway & Oxygen Therapy: Patient Spontanous Breathing and Patient connected to face mask oxygen  Post-op Assessment: Report given to RN and Post -op Vital signs reviewed and stable  Post vital signs: Reviewed and stable  Last Vitals:  Vitals Value Taken Time  BP 146/65 09/25/23 1018  Temp    Pulse 64 09/25/23 1019  Resp 15 09/25/23 1019  SpO2 96 % 09/25/23 1019  Vitals shown include unfiled device data.  Last Pain:  Vitals:   09/25/23 0659  TempSrc: Oral  PainSc: 0-No pain         Complications: No notable events documented.

## 2023-09-26 ENCOUNTER — Encounter (HOSPITAL_COMMUNITY): Payer: Self-pay | Admitting: Urology

## 2023-09-26 NOTE — Anesthesia Postprocedure Evaluation (Signed)
Anesthesia Post Note  Patient: Claire West  Procedure(s) Performed: STAGE 1 LEFT URETEROSCOPY/HOLMIUM LASER/STENT/RIGHT STENT (Left)     Patient location during evaluation: PACU Anesthesia Type: General Level of consciousness: awake and alert Pain management: pain level controlled Vital Signs Assessment: post-procedure vital signs reviewed and stable Respiratory status: spontaneous breathing, nonlabored ventilation, respiratory function stable and patient connected to nasal cannula oxygen Cardiovascular status: blood pressure returned to baseline and stable Postop Assessment: no apparent nausea or vomiting Anesthetic complications: no   No notable events documented.  Last Vitals:  Vitals:   09/25/23 1100 09/25/23 1115  BP: (!) 150/63 (!) 145/67  Pulse: 66 65  Resp: 17 15  Temp: 36.4 C 36.9 C  SpO2: 93% 95%    Last Pain:  Vitals:   09/25/23 1115  TempSrc:   PainSc: 0-No pain                 Amontae Ng S

## 2023-10-11 ENCOUNTER — Other Ambulatory Visit: Payer: Self-pay | Admitting: Urology

## 2023-10-11 NOTE — Patient Instructions (Addendum)
 Preop instructions for: Claire West  Date of Birth: 1941/01/05                      Date of Procedure:  Wednesday, Jan. 8, 2025 Procedure: STAGE 2 RIGHT URETEROSCOPY/HOLMIUM LASER/STENT       Surgeon: Dr. Arlyss Foot Facility contact:  [facility]   Phone:   [phone]              Health Care POA: RN contact name/phone#:  Charmaine Sharps LPN                        and Fax #: [phone]   Transportation contact phone#: Arley Gravely (Patient's son) [phone]   Please send day of procedure:       Current med list  Medications taken the day of procedure (return attached form to hospital) confirm time of nothing by mouth status (return attached form to hospital) Patient Demographic info( to include DNR status, problem list, allergies) Bring Insurance card and picture ID     Time to arrive at [facility]: 5:30 A.M.   Report to: Admitting (On your left hand side)    Do not eat solid food or drink past midnight the night before your procedure.(To include any tube feedings-must be discontinued)   Take these morning medications only with sips of water .(or give through gastrostomy or feeding tube).  Use eye drops per normal routine Buspirone Hydralazine  Metoprolol  Omeprazole   Stop all vitamins and herbal supplements 7 days before surgery.   Note: No Insulin or Diabetic meds should be given or taken the morning of the procedure!   Oral Hygiene is also important to reduce your risk of infection.                                    Remember - BRUSH YOUR TEETH THE MORNING OF SURGERY WITH YOUR REGULAR TOOTHPASTE   DENTURES WILL BE REMOVED PRIOR TO SURGERY PLEASE DO NOT APPLY Poly grip OR ADHESIVES!!!   Leave all jewelry and other valuables at place where living( no metal or rings to be worn) No contact lens Women-no make-up, no lotions,perfumes,powders   Any questions day of procedure,call  [facility] STAY-[phone]     Sent from :[facility]  Presurgical Testing                   Phone:[phone]                   Fax:[phone]   Sent by : Goku Harb BSN, RN

## 2023-10-11 NOTE — Progress Notes (Signed)
 Sent message, via epic in basket, requesting orders in epic from Careers adviser.

## 2023-10-11 NOTE — Progress Notes (Signed)
 [name] from [place] confirmed they received Claire West's pre op instructions, verbalized understanding.  I confirmed arrival time at 5:30 AM on 10/16/2023.

## 2023-10-15 NOTE — Anesthesia Preprocedure Evaluation (Addendum)
 Anesthesia Evaluation  Patient identified by MRN, date of birth, ID band Patient awake    Reviewed: Allergy & Precautions, NPO status , Patient's Chart, lab work & pertinent test results  History of Anesthesia Complications Negative for: history of anesthetic complications  Airway Mallampati: III  TM Distance: >3 FB    Comment: Patient does not fully cooperate with airway exam. Dental  (+) Dental Advisory Given   Pulmonary neg shortness of breath, neg sleep apnea, neg COPD, neg recent URI Supplemental oxygen  PRN   Pulmonary exam normal breath sounds clear to auscultation       Cardiovascular hypertension, Pt. on medications and Pt. on home beta blockers (-) angina (-) Past MI, (-) Cardiac Stents and (-) CABG (-) dysrhythmias  Rhythm:Regular Rate:Normal  HLD  TTE 12/27/2019: IMPRESSIONS    1. Left ventricular ejection fraction, by estimation, is 55 to 60%. The  left ventricle has normal function. The left ventricle has no regional  wall motion abnormalities. Left ventricular diastolic parameters are  consistent with Grade I diastolic  dysfunction (impaired relaxation).   2. Right ventricular systolic function is normal. The right ventricular  size is normal. Tricuspid regurgitation signal is inadequate for assessing  PA pressure.   3. The mitral valve is grossly normal. Trivial mitral valve  regurgitation.   4. The aortic valve was not well visualized. Aortic valve regurgitation  is mild.     Neuro/Psych neg Seizures PSYCHIATRIC DISORDERS Anxiety Depression   Dementia TIACVA (hemiplegia of left side), Residual Symptoms    GI/Hepatic Neg liver ROS,GERD  Medicated,,  Endo/Other  negative endocrine ROS    Renal/GU Renal disease (right renal stone)     Musculoskeletal  (+) Arthritis ,    Abdominal   Peds  Hematology Lab Results      Component                Value               Date                      WBC                       7.4                 08/25/2023                HGB                      12.2                08/25/2023                HCT                      36.9                08/25/2023                MCV                      94.6                08/25/2023                PLT  274                 08/25/2023              Anesthesia Other Findings   Reproductive/Obstetrics                             Anesthesia Physical Anesthesia Plan  ASA: 3  Anesthesia Plan: General   Post-op Pain Management: Tylenol  PO (pre-op)*   Induction: Intravenous  PONV Risk Score and Plan: 3 and Ondansetron , Dexamethasone  and Treatment may vary due to age or medical condition  Airway Management Planned: LMA  Additional Equipment:   Intra-op Plan:   Post-operative Plan: Extubation in OR  Informed Consent: I have reviewed the patients History and Physical, chart, labs and discussed the procedure including the risks, benefits and alternatives for the proposed anesthesia with the patient or authorized representative who has indicated his/her understanding and acceptance.     Dental advisory given  Plan Discussed with: CRNA and Anesthesiologist  Anesthesia Plan Comments: (Risks of general anesthesia discussed including, but not limited to, sore throat, hoarse voice, chipped/damaged teeth, injury to vocal cords, nausea and vomiting, allergic reactions, lung infection, heart attack, stroke, and death. All questions answered. )        Anesthesia Quick Evaluation

## 2023-10-16 ENCOUNTER — Ambulatory Visit (HOSPITAL_COMMUNITY): Payer: Self-pay | Admitting: Certified Registered Nurse Anesthetist

## 2023-10-16 ENCOUNTER — Ambulatory Visit (HOSPITAL_COMMUNITY): Payer: Medicare Other

## 2023-10-16 ENCOUNTER — Encounter (HOSPITAL_COMMUNITY): Payer: Self-pay | Admitting: Urology

## 2023-10-16 ENCOUNTER — Ambulatory Visit (HOSPITAL_COMMUNITY)
Admission: RE | Admit: 2023-10-16 | Discharge: 2023-10-16 | Disposition: A | Discharge status: Home / Self Care | Payer: Medicare Other | Attending: Urology | Admitting: Urology

## 2023-10-16 ENCOUNTER — Ambulatory Visit (HOSPITAL_BASED_OUTPATIENT_CLINIC_OR_DEPARTMENT_OTHER): Payer: Self-pay | Admitting: Certified Registered Nurse Anesthetist

## 2023-10-16 ENCOUNTER — Encounter (HOSPITAL_COMMUNITY)
Admission: RE | Disposition: A | Discharge status: Home / Self Care | Payer: Self-pay | Source: Home / Self Care | Attending: Urology

## 2023-10-16 DIAGNOSIS — K219 Gastro-esophageal reflux disease without esophagitis: Secondary | ICD-10-CM | POA: Insufficient documentation

## 2023-10-16 DIAGNOSIS — F418 Other specified anxiety disorders: Secondary | ICD-10-CM

## 2023-10-16 DIAGNOSIS — N132 Hydronephrosis with renal and ureteral calculous obstruction: Secondary | ICD-10-CM | POA: Diagnosis present

## 2023-10-16 DIAGNOSIS — E785 Hyperlipidemia, unspecified: Secondary | ICD-10-CM | POA: Insufficient documentation

## 2023-10-16 DIAGNOSIS — I10 Essential (primary) hypertension: Secondary | ICD-10-CM | POA: Diagnosis not present

## 2023-10-16 DIAGNOSIS — I69354 Hemiplegia and hemiparesis following cerebral infarction affecting left non-dominant side: Secondary | ICD-10-CM | POA: Insufficient documentation

## 2023-10-16 DIAGNOSIS — Z79899 Other long term (current) drug therapy: Secondary | ICD-10-CM | POA: Insufficient documentation

## 2023-10-16 DIAGNOSIS — N200 Calculus of kidney: Secondary | ICD-10-CM

## 2023-10-16 DIAGNOSIS — F0150 Vascular dementia without behavioral disturbance: Secondary | ICD-10-CM | POA: Insufficient documentation

## 2023-10-16 DIAGNOSIS — Z01818 Encounter for other preprocedural examination: Secondary | ICD-10-CM

## 2023-10-16 HISTORY — PX: CYSTOSCOPY/URETEROSCOPY/HOLMIUM LASER: SHX6545

## 2023-10-16 LAB — CBC
HCT: 36.9 % (ref 36.0–46.0)
Hemoglobin: 12.1 g/dL (ref 12.0–15.0)
MCH: 32.7 pg (ref 26.0–34.0)
MCHC: 32.8 g/dL (ref 30.0–36.0)
MCV: 99.7 fL (ref 80.0–100.0)
Platelets: 234 10*3/uL (ref 150–400)
RBC: 3.7 MIL/uL — ABNORMAL LOW (ref 3.87–5.11)
RDW: 13.7 % (ref 11.5–15.5)
WBC: 8.9 10*3/uL (ref 4.0–10.5)
nRBC: 0 % (ref 0.0–0.2)

## 2023-10-16 LAB — BASIC METABOLIC PANEL
Anion gap: 8 (ref 5–15)
BUN: 9 mg/dL (ref 8–23)
CO2: 24 mmol/L (ref 22–32)
Calcium: 8.6 mg/dL — ABNORMAL LOW (ref 8.9–10.3)
Chloride: 106 mmol/L (ref 98–111)
Creatinine, Ser: 0.48 mg/dL (ref 0.44–1.00)
GFR, Estimated: >60 mL/min (ref >60)
Glucose, Bld: 91 mg/dL (ref 70–99)
Potassium: 4.3 mmol/L (ref 3.5–5.1)
Sodium: 138 mmol/L (ref 135–145)

## 2023-10-16 SURGERY — CYSTOURETEROSCOPY, USING HOLMIUM LASER
Anesthesia: General | Laterality: Right

## 2023-10-16 MED ORDER — IOHEXOL 300 MG/ML  SOLN
INTRAMUSCULAR | Status: DC | PRN
  Administered 2023-10-16: 5 mL via URETHRAL

## 2023-10-16 MED ORDER — FENTANYL CITRATE (PF) 100 MCG/2ML IJ SOLN
INTRAMUSCULAR | Status: DC | PRN
  Administered 2023-10-16: 25 ug via INTRAVENOUS

## 2023-10-16 MED ORDER — OXYCODONE HCL 5 MG PO TABS: 5.0000 mg | ORAL_TABLET | Freq: Once | ORAL | Status: DC | PRN

## 2023-10-16 MED ORDER — HYOSCYAMINE SULFATE 0.125 MG SL SUBL: 0.1250 mg | SUBLINGUAL_TABLET | Freq: Four times a day (QID) | SUBLINGUAL | 0 refills | Status: AC | PRN

## 2023-10-16 MED ORDER — PROPOFOL 10 MG/ML IV BOLUS
INTRAVENOUS | Status: AC
  Filled 2023-10-16: qty 20

## 2023-10-16 MED ORDER — AMISULPRIDE (ANTIEMETIC) 5 MG/2ML IV SOLN: 10.0000 mg | Freq: Once | INTRAVENOUS | Status: DC | PRN

## 2023-10-16 MED ORDER — DEXTROSE 5 % IV SOLN
INTRAVENOUS | Status: DC | PRN
  Administered 2023-10-16: 2 g via INTRAVENOUS

## 2023-10-16 MED ORDER — SODIUM CHLORIDE 0.9 % IV SOLN
INTRAVENOUS | Status: AC
  Filled 2023-10-16: qty 20

## 2023-10-16 MED ORDER — FOSFOMYCIN TROMETHAMINE 3 G PO PACK: 3.0000 g | PACK | ORAL | 0 refills | Status: AC

## 2023-10-16 MED ORDER — DEXAMETHASONE SODIUM PHOSPHATE 4 MG/ML IJ SOLN
INTRAMUSCULAR | Status: DC | PRN
  Administered 2023-10-16: 8 mg via INTRAVENOUS

## 2023-10-16 MED ORDER — LIDOCAINE HCL (CARDIAC) PF 100 MG/5ML IV SOSY
PREFILLED_SYRINGE | INTRAVENOUS | Status: DC | PRN
  Administered 2023-10-16: 60 mg via INTRAVENOUS

## 2023-10-16 MED ORDER — FENTANYL CITRATE PF 50 MCG/ML IJ SOSY: 25.0000 ug | PREFILLED_SYRINGE | INTRAMUSCULAR | Status: DC | PRN

## 2023-10-16 MED ORDER — ACETAMINOPHEN 500 MG PO TABS
1000.0000 mg | ORAL_TABLET | Freq: Once | ORAL | Status: AC
  Administered 2023-10-16: 1000 mg via ORAL
  Filled 2023-10-16: qty 2

## 2023-10-16 MED ORDER — PROPOFOL 10 MG/ML IV BOLUS
INTRAVENOUS | Status: DC | PRN
  Administered 2023-10-16: 25 mg via INTRAVENOUS
  Administered 2023-10-16: 75 mg via INTRAVENOUS

## 2023-10-16 MED ORDER — SODIUM CHLORIDE 0.9 % IR SOLN
Status: DC | PRN
  Administered 2023-10-16: 3000 mL via INTRAVESICAL

## 2023-10-16 MED ORDER — OXYCODONE HCL 5 MG/5ML PO SOLN: 5.0000 mg | Freq: Once | ORAL | Status: DC | PRN

## 2023-10-16 MED ORDER — FENTANYL CITRATE (PF) 100 MCG/2ML IJ SOLN
INTRAMUSCULAR | Status: AC
  Filled 2023-10-16: qty 2

## 2023-10-16 MED ORDER — PHENYLEPHRINE HCL (PRESSORS) 10 MG/ML IV SOLN
INTRAVENOUS | Status: DC | PRN
  Administered 2023-10-16 (×2): 80 ug via INTRAVENOUS

## 2023-10-16 MED ORDER — CHLORHEXIDINE GLUCONATE 0.12 % MT SOLN: 15.0000 mL | Freq: Once | OROMUCOSAL | Status: DC

## 2023-10-16 MED ORDER — LACTATED RINGERS IV SOLN: INTRAVENOUS | Status: DC

## 2023-10-16 SURGICAL SUPPLY — 22 items
BAG COUNTER SPONGE SURGICOUNT (BAG) IMPLANT
BAG URO CATCHER STRL LF (MISCELLANEOUS) ×2 IMPLANT
BASKET ZERO TIP NITINOL 2.4FR (BASKET) IMPLANT
CATH URETERAL DUAL LUMEN 10F (MISCELLANEOUS) ×2 IMPLANT
CATH URETL OPEN END 6FR 70 (CATHETERS) IMPLANT
CLOTH BEACON ORANGE TIMEOUT ST (SAFETY) ×2 IMPLANT
FIBER LASER MOSES 200 DFL (Laser) IMPLANT
GLOVE SS BIOGEL STRL SZ 7 (GLOVE) ×2 IMPLANT
GLOVE SURG LX STRL 7.5 STRW (GLOVE) ×2 IMPLANT
GOWN STRL REUS W/ TWL XL LVL3 (GOWN DISPOSABLE) ×2 IMPLANT
GUIDEWIRE STR DUAL SENSOR (WIRE) ×2 IMPLANT
GUIDEWIRE ZIPWRE .038 STRAIGHT (WIRE) ×2 IMPLANT
IV NS 1000ML BAXH (IV SOLUTION) ×2 IMPLANT
KIT TURNOVER KIT A (KITS) IMPLANT
LASER FIB FLEXIVA PULSE ID 365 (Laser) IMPLANT
MANIFOLD NEPTUNE II (INSTRUMENTS) ×2 IMPLANT
PACK CYSTO (CUSTOM PROCEDURE TRAY) ×2 IMPLANT
SHEATH NAVIGATOR HD 11/13X36 (SHEATH) IMPLANT
STENT URET 6FRX24 CONTOUR (STENTS) IMPLANT
TRACTIP FLEXIVA PULS ID 200XHI (Laser) IMPLANT
TUBING CONNECTING 10 (TUBING) ×2 IMPLANT
TUBING UROLOGY SET (TUBING) ×2 IMPLANT

## 2023-10-16 NOTE — Anesthesia Postprocedure Evaluation (Signed)
 Anesthesia Post Note  Patient: Paitynn C Subia  Procedure(s) Performed: STAGE 2 RIGHT URETEROSCOPY/HOLMIUM LASER/STENT,LEFT STENT REMOVAL, RIGHT STENT EXCHANGE (Right)     Patient location during evaluation: PACU Anesthesia Type: General Level of consciousness: awake Pain management: pain level controlled Vital Signs Assessment: post-procedure vital signs reviewed and stable Respiratory status: spontaneous breathing, nonlabored ventilation and respiratory function stable Cardiovascular status: blood pressure returned to baseline and stable Postop Assessment: no apparent nausea or vomiting Anesthetic complications: no   No notable events documented.  Last Vitals:  Vitals:   10/16/23 1115 10/16/23 1116  BP: 133/71 133/71  Pulse: 61 63  Resp:  20  Temp:  36.6 C  SpO2: (!) 88% 100%    Last Pain:  Vitals:   10/16/23 1116  TempSrc: Axillary  PainSc:                  Delon Aisha Arch

## 2023-10-16 NOTE — Transfer of Care (Signed)
 Immediate Anesthesia Transfer of Care Note  Patient: Claire West  Procedure(s) Performed: Procedure(s) with comments: STAGE 2 RIGHT URETEROSCOPY/HOLMIUM LASER/STENT,LEFT STENT REMOVAL, RIGHT STENT EXCHANGE (Right) - 75 MINUTES NEEDED FOR CASE  Patient Location: PACU  Anesthesia Type:General  Level of Consciousness: Patient easily awoken, comfortable, cooperative, following commands, responds to stimulation.   Airway & Oxygen  Therapy: Patient spontaneously breathing, ventilating well, oxygen  via simple oxygen  mask.  Post-op Assessment: Report given to PACU RN, vital signs reviewed and stable, moving all extremities.   Post vital signs: Reviewed and stable.  Complications: No apparent anesthesia complications  Last Vitals:  Vitals Value Taken Time  BP 136/56 10/16/23 1032  Temp    Pulse 57 10/16/23 1034  Resp 15 10/16/23 1034  SpO2 100 % 10/16/23 1034  Vitals shown include unfiled device data.  Last Pain:  Vitals:   10/16/23 0722  TempSrc: Oral         Complications: No notable events documented.

## 2023-10-16 NOTE — Interval H&P Note (Signed)
 History and Physical Interval Note:  10/16/2023 8:20 AM  Claire West  has presented today for surgery, with the diagnosis of RIGHT RENAL STONE.  The various methods of treatment have been discussed with the patient and family. After consideration of risks, benefits and other options for treatment, the patient has consented to  Procedure(s) with comments: STAGE 2 RIGHT URETEROSCOPY/HOLMIUM LASER/STENT (Right) - 75 MINUTES NEEDED FOR CASE as a surgical intervention.  Also will be removing the left ureteral stent in the OR today  The patient's history has been reviewed, patient examined, no change in status, stable for surgery.  I have reviewed the patient's chart and labs.  Questions were answered to the patient's satisfaction.     Zamire Whitehurst L Kendahl Bumgardner

## 2023-10-16 NOTE — Anesthesia Procedure Notes (Signed)
 Procedure Name: LMA Insertion Date/Time: 10/16/2023 9:02 AM  Performed by: Joshua Vernell BROCKS, CRNAPre-anesthesia Checklist: Patient identified, Emergency Drugs available, Suction available and Patient being monitored Patient Re-evaluated:Patient Re-evaluated prior to induction Oxygen  Delivery Method: Circle system utilized Preoxygenation: Pre-oxygenation with 100% oxygen  Induction Type: IV induction Ventilation: Mask ventilation without difficulty LMA: LMA flexible inserted LMA Size: 4.0 Number of attempts: 1 Placement Confirmation: positive ETCO2 and breath sounds checked- equal and bilateral Tube secured with: Tape Dental Injury: Teeth and Oropharynx as per pre-operative assessment

## 2023-10-16 NOTE — Discharge Instructions (Addendum)
 [facility] [phone] Post Ureteroscopy With or Without Stent Instructions **remove stent by pulling string Monday morning**  Definitions:  Ureter: The duct that transports urine from the kidney to the bladder. Stent:   A plastic hollow tube that is placed into the ureter, from the kidney to the bladder to prevent the ureter from swelling shut.  GENERAL INSTRUCTIONS:  Despite the fact that no skin incisions were used, the area around the ureter and bladder is raw and irritated. The stent is a foreign body which will further irritate the bladder wall. This irritation is manifested by increased frequency of urination, both day and night, and by an increase in the urge to urinate. In some, the urge to urinate is present almost always. Sometimes the urge is strong enough that you may not be able to stop yourself from urinating. The only real cure is to remove the stent and then give time for the bladder wall to heal which can't be done until the danger of the ureter swelling shut has passed, which varies.  You may see some blood in your urine while the stent is in place and a few days afterwards. Do not be alarmed, even if the urine was clear for a while. Get off your feet and drink lots of fluids until clearing occurs. If you start to pass clots or don't improve, call us .  DIET: You may return to your normal diet immediately. Because of the raw surface of your bladder, alcohol, spicy foods, acid type foods and drinks with caffeine may cause irritation or frequency and should be used in moderation. To keep your urine flowing freely and to avoid constipation, drink plenty of fluids during the day ( 8-10 glasses ). Tip: Avoid cranberry juice because it is very acidic.  ACTIVITY: Your physical activity doesn't need to be restricted. However, if you are very active, you may see some blood in your urine. We suggest that you reduce your activity under these circumstances until the  bleeding has stopped.  BOWELS: It is important to keep your bowels regular during the postoperative period. Straining with bowel movements can cause bleeding. A bowel movement every other day is reasonable. Use a mild laxative if needed, such as Milk of Magnesia 2-3 tablespoons, or 2 Dulcolax tablets. Call if you continue to have problems. If you have been taking narcotics for pain, before, during or after your surgery, you may be constipated. Take a laxative if necessary.   MEDICATION: You should resume your pre-surgery medications unless told not to. In addition you will often be given an antibiotic to prevent infection and likely several as needed medications for stent related discomfort. These should be taken as prescribed until the bottles are finished unless you are having an unusual reaction to one of the drugs.  PROBLEMS YOU SHOULD REPORT TO US : Fevers over 100.5 Fahrenheit. Heavy bleeding, or clots ( See above notes about blood in urine ). Inability to urinate. Drug reactions ( hives, rash, nausea, vomiting, diarrhea ). Severe burning or pain with urination that is not improving.

## 2023-10-16 NOTE — Op Note (Signed)
 Operative Note  Preoperative diagnosis:  1.  bilateral renal stones 2. Bilateral ureteral stents indwelling 3. Left hydronephrosis  Postoperative diagnosis: 1.  same  Procedure(s): 1.  Right ureteroscopy with laser lithotripsy and basket extraction of stones 2. Cystoscopy with removal of left ureteral stent 3. Right retrograde pyelogram 4. Right ureteral stent re-placement 5. Fluoroscopy with intraoperative interpretation  Surgeon: Herlene Foot, MD  Assistants:  None  Anesthesia:  General  Complications:  None  EBL:  Minimal  Specimens: 1. None (stones dusted)  Drains/Catheters: 1.  Right 6Fr x 24cm ureteral stent with tether string  Intraoperative findings:   Cystoscopy demonstrated bilateral ureteral stents with some stone dust in bladder Left ureteral stent removed intact Ureteroscopy demonstrated 2 sizable stones in the right kidney, which were dusted Successful stent placement on the right  Description of procedure: After informed consent was obtained from the patient, the patient was identified and taken to the operating room and placed in the supine position.  General anesthesia was administered as well as perioperative IV antibiotics.  At the beginning of the case, a time-out was performed to properly identify the patient, the surgery to be performed, and the surgical site.  Sequential compression devices were applied to the lower extremities at the beginning of the case for DVT prophylaxis.  The patient was then placed in the dorsal lithotomy supine position, prepped and draped in sterile fashion.  We then passed the 21-French rigid cystoscope through the urethra and into the bladder under vision without any difficulty. A systematic evaluation of the bladder revealed no evidence of any suspicious bladder lesions.  Ureteral orifices were in normal position.    The left stent was grasped and removed intact  The cystoscope was reinserted. The distal aspect of the  ureteral stent was seen protruding from the right ureteral orifice.  We then used the alligator-tooth forceps and grasped the distal end of the ureteral stent and brought it out the urethral meatus while watching the proximal coil straighten out nicely on fluoroscopy. Through the ureteral stent, we then passed a 0.038 glide wire up to the level of the renal pelvis.  The ureteral stent was then removed, leaving the glide wire up the right ureter.  The cystoscope was withdrawn, and a dual lumen catheter was inserted over the glide wire into the distal ureter. A gentle retrograde pyelogram was performed, revealing a normal caliber ureter without any filling defects. There was no hydronephrosis of the collecting system. There was a filling defect in the collecting system corresponding to the renal stones. A 0.038 sensor wire was then passed up to the level of the renal pelvis and secured to the drape as a safety wire. The dual lumen was removed.  An 11/13Fr ureteral access sheath was carefully advanced up the ureter to the level of the UPJ over this wire under fluoroscopic guidance. The flexible ureteroscope was advanced into the collecting system via the access sheath. The collecting system was inspected. Large calculi identified in the upper pole and mid pole calyces. Using the 365 micron holmium laser fiber, the stones were dusted completely. With the ureteroscope in the kidney, a gentle pyelogram was performed to delineate the calyceal system and we evaluated the calyces systematically. The remaining stone fragments were very tiny and these were  irrigated away gently. The calyces were re-inspected and there were no significant stone fragment residual.   We then withdrew the ureteroscope back down the ureter along with the access sheath, noting no evidence of  any stones along the course of the ureter.  Prior to removing the ureteroscope, we did pass the Glidewire back up to the ureter to the renal pelvis.  Once  the ureteroscope was removed, we then used the Glidewire under fluoroscopic guidance and passed up a 6-French x 24 cm double-pigtail ureteral stent up the ureter, making sure that the proximal and distal ends coiled within the kidney and bladder respectively.   Note that we left a tether string attached to the distal end of the ureteral stent and it exited the urethral meatus and was tucked into the vagina with a short tether.  The cystoscope was then advanced back into the bladder under vision.  We were able to see the distal stent coiling nicely within the bladder.  The bladder was then emptied with irrigation solution.  The cystoscope was then removed.    The patient tolerated the procedure well and there was no complication. Patient was awoken from anesthesia and taken to the recovery room in stable condition. I was present and scrubbed for the entirety of the case.  Plan:  Patient will be discharged home and may remove stent in 5 days   G. Herlene Foot MD [facility]  Pager: [phone]

## 2023-10-17 ENCOUNTER — Encounter (HOSPITAL_COMMUNITY): Payer: Self-pay | Admitting: Urology
